# Patient Record
Sex: Female | Born: 1965 | Race: White | Hispanic: No | Marital: Married | Smoking: Former smoker
Health system: Southern US, Community
[De-identification: ages and names within clinical notes are randomized; demographics above are authoritative.]

## PROBLEM LIST (undated history)

## (undated) ENCOUNTER — Telehealth

## (undated) ENCOUNTER — Encounter

## (undated) ENCOUNTER — Encounter: Attending: Family Medicine | Primary: Family Medicine

## (undated) ENCOUNTER — Telehealth: Attending: Clinical | Primary: Clinical

## (undated) ENCOUNTER — Encounter: Attending: Psychiatry | Primary: Psychiatry

## (undated) ENCOUNTER — Ambulatory Visit: Payer: BLUE CROSS/BLUE SHIELD

## (undated) ENCOUNTER — Encounter: Payer: BLUE CROSS/BLUE SHIELD | Attending: Clinical | Primary: Clinical

## (undated) ENCOUNTER — Ambulatory Visit: Payer: PRIVATE HEALTH INSURANCE | Attending: Physician Assistant | Primary: Physician Assistant

## (undated) ENCOUNTER — Ambulatory Visit: Payer: BLUE CROSS/BLUE SHIELD | Attending: Psychiatry | Primary: Psychiatry

## (undated) ENCOUNTER — Encounter
Attending: Student in an Organized Health Care Education/Training Program | Primary: Student in an Organized Health Care Education/Training Program

## (undated) ENCOUNTER — Ambulatory Visit

## (undated) ENCOUNTER — Non-Acute Institutional Stay: Payer: PRIVATE HEALTH INSURANCE | Attending: Family Medicine | Primary: Family Medicine

## (undated) ENCOUNTER — Telehealth: Attending: Family Medicine | Primary: Family Medicine

## (undated) ENCOUNTER — Ambulatory Visit
Payer: BLUE CROSS/BLUE SHIELD | Attending: Addiction (Substance Use Disorder) | Primary: Addiction (Substance Use Disorder)

## (undated) ENCOUNTER — Ambulatory Visit: Payer: PRIVATE HEALTH INSURANCE

## (undated) ENCOUNTER — Inpatient Hospital Stay: Payer: BLUE CROSS/BLUE SHIELD

## (undated) ENCOUNTER — Telehealth
Attending: Student in an Organized Health Care Education/Training Program | Primary: Student in an Organized Health Care Education/Training Program

## (undated) ENCOUNTER — Ambulatory Visit: Payer: BLUE CROSS/BLUE SHIELD | Attending: Clinical | Primary: Clinical

## (undated) ENCOUNTER — Encounter
Payer: PRIVATE HEALTH INSURANCE | Attending: Student in an Organized Health Care Education/Training Program | Primary: Student in an Organized Health Care Education/Training Program

## (undated) ENCOUNTER — Ambulatory Visit
Payer: BLUE CROSS/BLUE SHIELD | Attending: Student in an Organized Health Care Education/Training Program | Primary: Student in an Organized Health Care Education/Training Program

## (undated) ENCOUNTER — Other Ambulatory Visit: Attending: Clinical | Primary: Clinical

## (undated) ENCOUNTER — Encounter: Attending: Family | Primary: Family

## (undated) ENCOUNTER — Ambulatory Visit: Payer: PRIVATE HEALTH INSURANCE | Attending: Clinical | Primary: Clinical

## (undated) ENCOUNTER — Ambulatory Visit: Payer: MEDICARE

## (undated) ENCOUNTER — Encounter: Attending: Anesthesiology | Primary: Anesthesiology

## (undated) ENCOUNTER — Encounter: Attending: Adult Health | Primary: Adult Health

## (undated) ENCOUNTER — Ambulatory Visit: Attending: Addiction (Substance Use Disorder) | Primary: Addiction (Substance Use Disorder)

## (undated) ENCOUNTER — Encounter: Attending: Clinical | Primary: Clinical

## (undated) ENCOUNTER — Telehealth: Attending: Adult Health | Primary: Adult Health

## (undated) ENCOUNTER — Ambulatory Visit
Attending: Student in an Organized Health Care Education/Training Program | Primary: Student in an Organized Health Care Education/Training Program

## (undated) ENCOUNTER — Ambulatory Visit
Payer: MEDICARE | Attending: Student in an Organized Health Care Education/Training Program | Primary: Student in an Organized Health Care Education/Training Program

## (undated) ENCOUNTER — Ambulatory Visit: Attending: Pharmacist | Primary: Pharmacist

## (undated) ENCOUNTER — Encounter: Payer: BLUE CROSS/BLUE SHIELD | Attending: Psychiatry | Primary: Psychiatry

## (undated) ENCOUNTER — Ambulatory Visit
Payer: PRIVATE HEALTH INSURANCE | Attending: Student in an Organized Health Care Education/Training Program | Primary: Student in an Organized Health Care Education/Training Program

## (undated) ENCOUNTER — Ambulatory Visit: Payer: BLUE CROSS/BLUE SHIELD | Attending: Family | Primary: Family

## (undated) ENCOUNTER — Ambulatory Visit: Payer: Medicaid (Managed Care)

## (undated) ENCOUNTER — Other Ambulatory Visit

## (undated) ENCOUNTER — Ambulatory Visit: Attending: Clinical | Primary: Clinical

## (undated) ENCOUNTER — Ambulatory Visit: Payer: PRIVATE HEALTH INSURANCE | Attending: Family Medicine | Primary: Family Medicine

## (undated) ENCOUNTER — Ambulatory Visit: Payer: BLUE CROSS/BLUE SHIELD | Attending: Anesthesiology | Primary: Anesthesiology

## (undated) ENCOUNTER — Ambulatory Visit: Payer: MEDICARE | Attending: Clinical | Primary: Clinical

## (undated) ENCOUNTER — Ambulatory Visit: Payer: MEDICARE | Attending: Family Medicine | Primary: Family Medicine

## (undated) DIAGNOSIS — R112 Nausea with vomiting, unspecified: Secondary | ICD-10-CM

## (undated) DIAGNOSIS — N63 Unspecified lump in unspecified breast: Secondary | ICD-10-CM

## (undated) DIAGNOSIS — Z8489 Family history of other specified conditions: Secondary | ICD-10-CM

## (undated) DIAGNOSIS — J45909 Unspecified asthma, uncomplicated: Secondary | ICD-10-CM

## (undated) DIAGNOSIS — K219 Gastro-esophageal reflux disease without esophagitis: Secondary | ICD-10-CM

## (undated) DIAGNOSIS — F32A Depression, unspecified: Secondary | ICD-10-CM

## (undated) DIAGNOSIS — K760 Fatty (change of) liver, not elsewhere classified: Secondary | ICD-10-CM

## (undated) DIAGNOSIS — R51 Headache: Secondary | ICD-10-CM

## (undated) DIAGNOSIS — E669 Obesity, unspecified: Secondary | ICD-10-CM

## (undated) DIAGNOSIS — Z9889 Other specified postprocedural states: Secondary | ICD-10-CM

## (undated) DIAGNOSIS — F329 Major depressive disorder, single episode, unspecified: Secondary | ICD-10-CM

## (undated) DIAGNOSIS — Z720 Tobacco use: Secondary | ICD-10-CM

## (undated) DIAGNOSIS — Z1389 Encounter for screening for other disorder: Secondary | ICD-10-CM

## (undated) DIAGNOSIS — I10 Essential (primary) hypertension: Secondary | ICD-10-CM

## (undated) DIAGNOSIS — G47419 Narcolepsy without cataplexy: Secondary | ICD-10-CM

## (undated) DIAGNOSIS — R519 Headache, unspecified: Secondary | ICD-10-CM

## (undated) DIAGNOSIS — F988 Other specified behavioral and emotional disorders with onset usually occurring in childhood and adolescence: Secondary | ICD-10-CM

## (undated) DIAGNOSIS — Z1239 Encounter for other screening for malignant neoplasm of breast: Secondary | ICD-10-CM

## (undated) DIAGNOSIS — N611 Abscess of the breast and nipple: Secondary | ICD-10-CM

## (undated) DIAGNOSIS — M549 Dorsalgia, unspecified: Secondary | ICD-10-CM

## (undated) DIAGNOSIS — F909 Attention-deficit hyperactivity disorder, unspecified type: Secondary | ICD-10-CM

## (undated) HISTORY — DX: Depression, unspecified: F32.A

## (undated) HISTORY — DX: Encounter for screening for other disorder: Z13.89

## (undated) HISTORY — DX: Major depressive disorder, single episode, unspecified: F32.9

## (undated) HISTORY — PX: INCISION AND DRAINAGE BREAST ABSCESS: SUR672

## (undated) HISTORY — PX: HERNIA REPAIR: SHX51

## (undated) HISTORY — DX: Encounter for other screening for malignant neoplasm of breast: Z12.39

## (undated) HISTORY — DX: Abscess of the breast and nipple: N61.1

## (undated) HISTORY — DX: Dorsalgia, unspecified: M54.9

## (undated) HISTORY — DX: Unspecified lump in unspecified breast: N63.0

## (undated) HISTORY — DX: Obesity, unspecified: E66.9

## (undated) HISTORY — DX: Essential (primary) hypertension: I10

## (undated) HISTORY — DX: Tobacco use: Z72.0

---
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
## 1991-03-05 DIAGNOSIS — N611 Abscess of the breast and nipple: Secondary | ICD-10-CM

## 1991-03-05 HISTORY — DX: Abscess of the breast and nipple: N61.1

## 1999-03-05 HISTORY — PX: ABDOMINAL HYSTERECTOMY: SHX81

## 2003-12-19 ENCOUNTER — Ambulatory Visit: Payer: Self-pay | Admitting: Pain Medicine

## 2003-12-26 ENCOUNTER — Ambulatory Visit: Payer: Self-pay | Admitting: Pain Medicine

## 2004-01-10 ENCOUNTER — Ambulatory Visit: Payer: Self-pay | Admitting: Pain Medicine

## 2004-01-17 ENCOUNTER — Ambulatory Visit: Payer: Self-pay | Admitting: Physician Assistant

## 2004-01-17 ENCOUNTER — Emergency Department: Payer: Self-pay | Admitting: Emergency Medicine

## 2004-01-24 ENCOUNTER — Ambulatory Visit: Payer: Self-pay | Admitting: Pain Medicine

## 2005-01-19 ENCOUNTER — Emergency Department: Payer: Self-pay | Admitting: Internal Medicine

## 2007-01-16 ENCOUNTER — Emergency Department: Payer: Self-pay | Admitting: Emergency Medicine

## 2007-03-12 ENCOUNTER — Ambulatory Visit: Payer: Self-pay | Admitting: Family Medicine

## 2007-03-24 ENCOUNTER — Ambulatory Visit: Payer: Self-pay | Admitting: Family Medicine

## 2007-03-30 ENCOUNTER — Emergency Department: Payer: Self-pay | Admitting: Emergency Medicine

## 2008-11-13 ENCOUNTER — Emergency Department: Payer: Self-pay | Admitting: Emergency Medicine

## 2009-01-11 ENCOUNTER — Observation Stay: Payer: Self-pay | Admitting: Internal Medicine

## 2009-06-05 ENCOUNTER — Ambulatory Visit: Payer: Self-pay | Admitting: Surgery

## 2011-03-05 HISTORY — PX: BREAST BIOPSY: SHX20

## 2011-04-28 ENCOUNTER — Emergency Department: Payer: Self-pay | Admitting: Emergency Medicine

## 2011-05-29 ENCOUNTER — Emergency Department: Payer: Self-pay | Admitting: Emergency Medicine

## 2012-01-13 ENCOUNTER — Ambulatory Visit: Payer: Self-pay | Admitting: Family Medicine

## 2012-01-16 ENCOUNTER — Ambulatory Visit: Payer: Self-pay | Admitting: Family Medicine

## 2012-02-15 ENCOUNTER — Encounter: Payer: Self-pay | Admitting: Pediatrics

## 2012-02-15 ENCOUNTER — Encounter: Payer: Self-pay | Admitting: *Deleted

## 2012-03-04 HISTORY — PX: BREAST SURGERY: SHX581

## 2012-08-17 ENCOUNTER — Ambulatory Visit: Payer: Self-pay | Admitting: General Surgery

## 2012-08-17 ENCOUNTER — Encounter: Payer: Self-pay | Admitting: General Surgery

## 2012-08-27 ENCOUNTER — Ambulatory Visit: Payer: Self-pay | Admitting: General Surgery

## 2012-09-04 ENCOUNTER — Emergency Department: Payer: Self-pay | Admitting: Emergency Medicine

## 2012-09-04 LAB — URINALYSIS, COMPLETE
Bilirubin,UR: NEGATIVE
Blood: NEGATIVE
Ketone: NEGATIVE
Leukocyte Esterase: NEGATIVE
Nitrite: NEGATIVE
Ph: 6 (ref 4.5–8.0)
Specific Gravity: 1.018 (ref 1.003–1.030)
Squamous Epithelial: <1
WBC UR: 1 /HPF (ref 0–5)

## 2012-09-04 LAB — LIPASE, BLOOD: Lipase: 161 U/L (ref 73–393)

## 2012-09-04 LAB — COMPREHENSIVE METABOLIC PANEL
Alkaline Phosphatase: 59 U/L (ref 50–136)
Bilirubin,Total: 0.7 mg/dL (ref 0.2–1.0)
Chloride: 103 mmol/L (ref 98–107)
Co2: 28 mmol/L (ref 21–32)
EGFR (African American): >60
Glucose: 116 mg/dL — ABNORMAL HIGH (ref 65–99)
Osmolality: 277 (ref 275–301)
SGOT(AST): 41 U/L — ABNORMAL HIGH (ref 15–37)
SGPT (ALT): 69 U/L (ref 12–78)

## 2012-09-04 LAB — CBC
HGB: 14.3 g/dL (ref 12.0–16.0)
MCHC: 34.3 g/dL (ref 32.0–36.0)
MCV: 84 fL (ref 80–100)
Platelet: 211 10*3/uL (ref 150–440)
RDW: 14 % (ref 11.5–14.5)
WBC: 9.5 10*3/uL (ref 3.6–11.0)

## 2012-09-04 LAB — TROPONIN I: Troponin-I: <0.02 ng/mL

## 2012-09-07 ENCOUNTER — Encounter: Payer: Self-pay | Admitting: *Deleted

## 2012-09-14 ENCOUNTER — Encounter: Payer: Self-pay | Admitting: General Surgery

## 2012-09-14 ENCOUNTER — Inpatient Hospital Stay
Admission: RE | Admit: 2012-09-14 | Discharge: 2012-09-14 | Disposition: A | Discharge status: Home / Self Care | Payer: Self-pay | Source: Ambulatory Visit | Attending: General Surgery | Admitting: General Surgery

## 2012-09-14 ENCOUNTER — Ambulatory Visit (INDEPENDENT_AMBULATORY_CARE_PROVIDER_SITE_OTHER): Payer: BC Managed Care – PPO | Admitting: General Surgery

## 2012-09-14 VITALS — BP 140/70 | HR 68 | Resp 14 | Ht 62.0 in | Wt 292.0 lb

## 2012-09-14 DIAGNOSIS — N63 Unspecified lump in unspecified breast: Secondary | ICD-10-CM

## 2012-09-14 NOTE — Progress Notes (Signed)
Patient ID: [name], female   DOB: 1966-02-11, 47 y.o.   MRN: [id number]  Chief Complaint  Patient presents with  . Follow-up    mammogram    HPI [name] is a 47 y.o. female here for follow up mammogram Uni left done at [facility] 08/17/12. Patient reports some left breast tenderness that will extend into her left shoulder and arm down to her wrist and lasts for about a week. She states this occurs about every 6 weeks. Pain is described as a 7-8 on the pain scale. This is unchanged from her previous visit. She states that the mass near her left axilla seems to enlarge during these episodes. The patient underwent biopsy of multiple lesions of the left breast on January 29, 2012. Biopsies of the 2, 5 and 8:00 position were completed. The dominant lesion was at the 2:00 position. It is from this area the patient experienced most of her pain extending up into the shoulder and on down in the arm. HPI  Past Medical History  Diagnosis Date  . Hypertension   . Tobacco use   . Breast screening   . Lump or mass in breast     left  . Obesity, unspecified   . Back pain   . Screening for obesity   . Breast abscess 1993    right    Past Surgical History  Procedure Laterality Date  . Abdominal hysterectomy  2001  . Breast biopsy Left 2013  . Cholecystectomy  1986  . Cesarean section  1986, 1991    Family History  Problem Relation Age of Onset  . Other Other 65    Colon Cancer, Skin Cancer  . Other Maternal Uncle 18    Prostate Cancer    Social History History  Substance Use Topics  . Smoking status: Former Smoker -- 1.00 packs/day    Types: Cigarettes  . Smokeless tobacco: Never Used  . Alcohol Use: Yes    Allergies  Allergen Reactions  . Cyclosporine Nausea And Vomiting  . Haldol (Haloperidol Lactate) Other (See Comments)    hyperactivity    Current Outpatient Prescriptions  Medication Sig Dispense Refill  . amitriptyline (ELAVIL) 50 MG tablet Take 1 tablet by mouth  daily.      Marland Kitchen amLODipine (NORVASC) 5 MG tablet Take 5 mg by mouth daily.      . chlorthalidone (HYGROTON) 25 MG tablet Take 25 mg by mouth daily.      Marland Kitchen FLUoxetine (PROZAC) 40 MG capsule Take 40 mg by mouth daily.      . Ibuprofen (ADVIL PO) Take by mouth.      . METOPROLOL SUCCINATE ER PO Take 25 mg by mouth daily.      . montelukast (SINGULAIR) 10 MG tablet Take 10 mg by mouth daily.       No current facility-administered medications for this visit.    Review of Systems Review of Systems  HENT: Negative.   Respiratory: Negative.   Cardiovascular: Negative.     Blood pressure 140/70, pulse 68, resp. rate 14, height 5\' 2"  (1.575 m), weight 292 lb (132.45 kg).  Physical Exam Physical Exam  Constitutional: She is oriented to person, place, and time. She appears well-developed and well-nourished.  Eyes: Conjunctivae are normal. No scleral icterus.  Neck: Neck supple.  Cardiovascular: Normal rate, regular rhythm and normal heart sounds.   Pulmonary/Chest: Effort normal and breath sounds normal. Right breast exhibits no inverted nipple, no mass, no nipple discharge, no  skin change and no tenderness. Left breast exhibits skin change (2 o'clock 10 cm from the nipple there is an area of thickening).    Neurological: She is alert and oriented to person, place, and time.    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   Ultrasound examination of the 2:00 position of the left breast shows the previously identified area to have significantly increased in size to 1.2 x 2.5 x 3.5 cm. Previously this area measured 0.8 x 1.5 x 2.6 cm.  Left breast mammogram dated August 17, 2012 shows 3 biopsy clips in the left breast, one within the lesion at the 2:00 position. BI-RAD-2.  Assessment    Enlarging left breast mass.     Plan    The left breast lesion is enlarging and  appears to be the foci for pain of the left breast formal excision is recommended. The patient is well aware that this may not relieve all of the radicular pain she is experiencing in the left upper extremity.      Patient's surgery has been scheduled for 09-25-12 at [facility].  Earline Mayotte 09/15/2012, 8:59 PM   a

## 2012-09-14 NOTE — Patient Instructions (Addendum)
Lumpectomy, Breast Conserving Surgery A lumpectomy is breast surgery that removes only part of the breast. Another name used may be partial mastectomy. The amount removed varies. Make sure you understand how much of your breast will be removed. Reasons for a lumpectomy:  Any solid breast mass.  Grouped significant nodularity that may be confused with a solitary breast mass. Lumpectomy is the most common form of breast cancer surgery today. The surgeon removes the portion of your breast which contains the tumor (cancer). This is the lump. Some normal tissue around the lump is also removed to be sure that all the tumor has been removed.  If cancer cells are found in the margins where the breast tissue was removed, your surgeon will do more surgery to remove the remaining cancer tissue. This is called re-excision surgery. Radiation and/or chemotherapy treatments are often given following a lumpectomy to kill any cancer cells that could possibly remain.  REASONS YOU MAY NOT BE ABLE TO HAVE BREAST CONSERVING SURGERY:  The tumor is located in more than one place.  Your breast is small and the tumor is large so the breast would be disfigured.  The entire tumor removal is not successful with a lumpectomy.  You cannot commit to a full course of chemotherapy, radiation therapy or are pregnant and cannot have radiation.  You have previously had radiation to the breast to treat cancer. HOW A LUMPECTOMY IS PERFORMED If overnight nursing is not required following a biopsy, a lumpectomy can be performed as a same-day surgery. This can be done in a hospital, clinic, or surgical center. The anesthesia used will depend on your surgeon. They will discuss this with you. A general anesthetic keeps you sleeping through the procedure. LET YOUR CAREGIVERS KNOW ABOUT THE FOLLOWING:  Allergies  Medications taken including herbs, eye drops, over the counter medications, and creams.  Use of steroids (by mouth or  creams)  Previous problems with anesthetics or Novocaine.  Possibility of pregnancy, if this applies  History of blood clots (thrombophlebitis)  History of bleeding or blood problems.  Previous surgery  Other health problems BEFORE THE PROCEDURE You should be present one hour prior to your procedure unless directed otherwise.  AFTER THE PROCEDURE  After surgery, you will be taken to the recovery area where a nurse will watch and check your progress. Once you're awake, stable, and taking fluids well, barring other problems you will be allowed to go home.  Ice packs applied to your operative site may help with discomfort and keep the swelling down.  A small rubber drain may be placed in the breast for a couple of days to prevent a hematoma from developing in the breast.  A pressure dressing may be applied for 24 to 48 hours to prevent bleeding.  Keep the wound dry.  You may resume a normal diet and activities as directed. Avoid strenuous activities affecting the arm on the side of the biopsy site such as tennis, swimming, heavy lifting (more than 10 pounds) or pulling.  Bruising in the breast is normal following this procedure.  Wearing a bra - even to bed - may be more comfortable and also help keep the dressing on.  Change dressings as directed.  Only take over-the-counter or prescription medicines for pain, discomfort, or fever as directed by your caregiver. Call for your results as instructed by your surgeon. Remember it is your responsibility to get the results of your lumpectomy if your surgeon asked you to follow-up. Do not assume   everything is fine if you have not heard from your caregiver. SEEK MEDICAL CARE IF:   There is increased bleeding (more than a small spot) from the wound.  You notice redness, swelling, or increasing pain in the wound.  Pus is coming from wound.  An unexplained oral temperature above 102 F (38.9 C) develops.  You notice a foul smell  coming from the wound or dressing. SEEK IMMEDIATE MEDICAL CARE IF:   You develop a rash.  You have difficulty breathing.  You have any allergic problems. Document Released: 04/01/2006 Document Revised: 05/13/2011 Document Reviewed: 07/03/2006 [facility] Patient Information 2014 [place], [place].  Patient's surgery has been scheduled for 09-25-12 at [facility].

## 2012-09-15 ENCOUNTER — Encounter: Payer: Self-pay | Admitting: General Surgery

## 2012-09-15 ENCOUNTER — Other Ambulatory Visit: Payer: Self-pay | Admitting: General Surgery

## 2012-09-15 DIAGNOSIS — N63 Unspecified lump in unspecified breast: Secondary | ICD-10-CM

## 2012-09-24 ENCOUNTER — Ambulatory Visit: Payer: Self-pay | Admitting: Anesthesiology

## 2012-09-24 ENCOUNTER — Telehealth: Payer: Self-pay | Admitting: *Deleted

## 2012-09-24 LAB — POTASSIUM: Potassium: 3 mmol/L — ABNORMAL LOW (ref 3.5–5.1)

## 2012-09-24 NOTE — Telephone Encounter (Signed)
KCL RX/ patient aware KCL 20 meq 1 po three times today and then once in am #10 per Dr Lemar Livings was called in

## 2012-09-25 ENCOUNTER — Ambulatory Visit: Payer: Self-pay | Admitting: General Surgery

## 2012-09-25 DIAGNOSIS — D249 Benign neoplasm of unspecified breast: Secondary | ICD-10-CM

## 2012-09-28 ENCOUNTER — Encounter: Payer: Self-pay | Admitting: General Surgery

## 2012-09-29 ENCOUNTER — Telehealth: Payer: Self-pay

## 2012-09-29 NOTE — Telephone Encounter (Signed)
Patient notified of results and is very pleased, she will follow up as scheduled.

## 2012-09-29 NOTE — Telephone Encounter (Signed)
Message copied by Sinda Du on Tue Sep 29, 2012 10:04 AM ------      Message from: [place], Merrily Pew      Created: Tue Sep 29, 2012 10:00 AM       Please notify the patient the left breast biopsy completed last week was fine: Fibroadenoma, as expected. Thanks. ------

## 2012-09-30 ENCOUNTER — Encounter: Payer: Self-pay | Admitting: General Surgery

## 2012-10-05 ENCOUNTER — Encounter: Payer: Self-pay | Admitting: General Surgery

## 2012-10-05 ENCOUNTER — Ambulatory Visit (INDEPENDENT_AMBULATORY_CARE_PROVIDER_SITE_OTHER): Payer: BC Managed Care – PPO | Admitting: General Surgery

## 2012-10-05 VITALS — BP 130/72 | HR 72 | Resp 14 | Ht 66.0 in | Wt 289.0 lb

## 2012-10-05 DIAGNOSIS — D242 Benign neoplasm of left breast: Secondary | ICD-10-CM

## 2012-10-05 DIAGNOSIS — N63 Unspecified lump in unspecified breast: Secondary | ICD-10-CM

## 2012-10-05 DIAGNOSIS — D249 Benign neoplasm of unspecified breast: Secondary | ICD-10-CM

## 2012-10-05 NOTE — Patient Instructions (Signed)
Patient to return in 6 months with a bilateral mammogram.

## 2012-10-05 NOTE — Progress Notes (Signed)
Patient ID: [name], female   DOB: [birth year], 47 y.o.   MRN: [id number]  Chief Complaint  Patient presents with  . Routine Post Op    left breast excision of fibroadenoma    [name] is a 47 y.o. female who presents for a post op left breast excision of a fibroadenoma. The procedure was done on 09/25/12. The only complaint at this time is some stinging that she noticed last night. Overall she is doing well.   HPI  Past Medical History  Diagnosis Date  . Hypertension   . Tobacco use   . Breast screening   . Lump or mass in breast     left  . Obesity, unspecified   . Back pain   . Screening for obesity   . Breast abscess 1993    right    Past Surgical History  Procedure Laterality Date  . Abdominal hysterectomy  2001  . Cholecystectomy  1986  . Cesarean section  1986, 1991  . Breast biopsy Left 2013  . Breast surgery Left 2014    excision of fibroadenoma.    Family History  Problem Relation Age of Onset  . Other Other 65    Colon Cancer, Skin Cancer  . Other Maternal Uncle 44    Prostate Cancer    Social History History  Substance Use Topics  . Smoking status: Former Smoker -- 1.00 packs/day    Types: Cigarettes  . Smokeless tobacco: Never Used  . Alcohol Use: Yes    Allergies  Allergen Reactions  . Cyclosporine Nausea And Vomiting  . Haldol (Haloperidol Lactate) Other (See Comments)    hyperactivity    Current Outpatient Prescriptions  Medication Sig Dispense Refill  . amitriptyline (ELAVIL) 50 MG tablet Take 1 tablet by mouth daily.      Marland Kitchen amLODipine (NORVASC) 5 MG tablet Take 5 mg by mouth daily.      . chlorthalidone (HYGROTON) 25 MG tablet Take 25 mg by mouth daily.      Marland Kitchen FLUoxetine (PROZAC) 40 MG capsule Take 40 mg by mouth daily.      . Ibuprofen (ADVIL PO) Take by mouth.      . METOPROLOL SUCCINATE ER PO Take 25 mg by mouth daily.      . montelukast (SINGULAIR) 10 MG tablet Take 10 mg by mouth daily.       No current  facility-administered medications for this visit.    Review of Systems Review of Systems  Constitutional: Negative.   Respiratory: Negative.   Cardiovascular: Negative.     Blood pressure 130/72, pulse 72, resp. rate 14, height 5\' 6"  (1.676 m), weight 289 lb (131.09 kg).  Physical Exam Physical Exam  Constitutional: She is oriented to person, place, and time. She appears well-developed and well-nourished.  Pulmonary/Chest:  2 mm separation of wound on left breast.   Neurological: She is alert and oriented to person, place, and time.  Skin: Skin is warm and dry.    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    Plan    The patient will resume annual bilateral mammograms in six months with an office visit to follow.        Earline Mayotte 10/06/2012, 12:44 PM

## 2013-01-15 ENCOUNTER — Inpatient Hospital Stay: Payer: Self-pay | Admitting: Psychiatry

## 2013-01-15 LAB — TSH: Thyroid Stimulating Horm: 3.31 u[IU]/mL

## 2013-01-15 LAB — COMPREHENSIVE METABOLIC PANEL
Albumin: 3.8 g/dL (ref 3.4–5.0)
Anion Gap: 7 (ref 7–16)
Bilirubin,Total: 1 mg/dL (ref 0.2–1.0)
Chloride: 105 mmol/L (ref 98–107)
Creatinine: 0.96 mg/dL (ref 0.60–1.30)
Glucose: 131 mg/dL — ABNORMAL HIGH (ref 65–99)
Osmolality: 277 (ref 275–301)
Potassium: 2.9 mmol/L — ABNORMAL LOW (ref 3.5–5.1)
SGOT(AST): 56 U/L — ABNORMAL HIGH (ref 15–37)
SGPT (ALT): 93 U/L — ABNORMAL HIGH (ref 12–78)
Sodium: 139 mmol/L (ref 136–145)
Total Protein: 7.1 g/dL (ref 6.4–8.2)

## 2013-01-15 LAB — DRUG SCREEN, URINE
Amphetamines, Ur Screen: NEGATIVE (ref ?–1000)
Barbiturates, Ur Screen: NEGATIVE (ref ?–200)
Benzodiazepine, Ur Scrn: NEGATIVE (ref ?–200)
Cannabinoid 50 Ng, Ur ~~LOC~~: NEGATIVE (ref ?–50)
Cocaine Metabolite,Ur ~~LOC~~: NEGATIVE (ref ?–300)
MDMA (Ecstasy)Ur Screen: NEGATIVE (ref ?–500)
Tricyclic, Ur Screen: NEGATIVE (ref ?–1000)

## 2013-01-15 LAB — CBC
HCT: 43.2 % (ref 35.0–47.0)
MCV: 84 fL (ref 80–100)
RBC: 5.13 10*6/uL (ref 3.80–5.20)
RDW: 14.5 % (ref 11.5–14.5)
WBC: 8.7 10*3/uL (ref 3.6–11.0)

## 2013-01-15 LAB — ETHANOL: Ethanol: <3 mg/dL

## 2013-01-23 LAB — BASIC METABOLIC PANEL
BUN: 14 mg/dL (ref 7–18)
Co2: 28 mmol/L (ref 21–32)
Creatinine: 0.82 mg/dL (ref 0.60–1.30)
Glucose: 90 mg/dL (ref 65–99)
Potassium: 3.5 mmol/L (ref 3.5–5.1)

## 2013-03-24 ENCOUNTER — Ambulatory Visit: Payer: BC Managed Care – PPO | Admitting: General Surgery

## 2013-04-15 ENCOUNTER — Encounter: Payer: Self-pay | Admitting: *Deleted

## 2013-09-30 DIAGNOSIS — F988 Other specified behavioral and emotional disorders with onset usually occurring in childhood and adolescence: Secondary | ICD-10-CM | POA: Insufficient documentation

## 2014-01-03 ENCOUNTER — Encounter: Payer: Self-pay | Admitting: General Surgery

## 2014-06-24 NOTE — Discharge Summary (Signed)
PATIENT NAME:  [name], [name] MR#:  [id number] DATE OF BIRTH:  [birth year]  DATE OF ADMISSION:  01/15/2013 DATE OF DISCHARGE:  01/23/2013  HOSPITAL COURSE: See dictated history and physical for details of admission. A 49 year old woman with a history of mood lability was admitted to the hospital because of a feeling of being out of control,  having both homicidal and suicidal ideation. In the hospital, she was treated with medication management as well as individual and group psychotherapy. She showed gradual improvement with initially some resistance to treatment but soon became appropriately interactive and showed good insight. She tolerated medicine well. At no time in the hospital did she display any suicidal behavior. Medical issues appeared to be stable. The patient agreed at the time of discharge to follow up with Dr. Annitta Jersey at our psychiatric clinic. She was counseled about the importance of staying on medication and staying involved in therapy to work on better ways to cope with managing the stress that she is undergoing.   MENTAL STATUS EXAM AT DISCHARGE: Neatly dressed and groomed woman, looks her stated age, cooperative with the interview. Good eye contact. Normal psychomotor activity. Speech normal rate, tone and volume. Affect euthymic, reactive, appropriate. Mood stated as good, thoughts lucid. No evidence of loosening of associations or delusions. Denies auditory or visual hallucinations. Denies suicidal or homicidal ideation. Shows improved insight and judgment. Normal intelligence. Alert and oriented x 4.   DISCHARGE MEDICATIONS: Trazodone 100 mg at night, duloxetine 60 mg once a day, metoprolol 25 mg once a day, Combivent inhaler 4 times a day as needed for chronic obstructive pulmonary disease, amlodipine 5 mg once a day, hydrochlorothiazide 25 mg once a day, potassium chloride 10 mEq twice a day.   LABORATORY RESULTS: Admission labs showed a chemistry panel with elevated glucose 131,  potassium low at 2.9. ALT elevated at 93, AST elevated at 56. Alcohol undetected. TSH 3.3, normal. Drug screen negative. Follow-up chemistry panel showed resolution of the potassium. CBC normal.   DISPOSITION: Discharge home. Follow up with Dr. Annitta Jersey.   DIAGNOSIS, PRINCIPAL AND PRIMARY:   AXIS I: Major depression, recurrent, severe.   SECONDARY DIAGNOSES: AXIS I: Deferred.   AXIS II: Borderline features.   AXIS III: High blood pressure, chronic obstructive pulmonary disease, low potassium.   AXIS IV: Severe from the difficulty getting along with her family.   AXIS V: Functioning at time of discharge 56.    ____________________________ Gonzella Lex, MD jtc:cc D: 01/26/2013 23:09:37 ET T: 01/26/2013 23:15:56 ET JOB#: [id number]  cc: Gonzella Lex, MD, <Dictator> Gonzella Lex MD ELECTRONICALLY SIGNED 01/26/2013 23:45

## 2014-06-24 NOTE — Op Note (Signed)
PATIENT NAME:  [name], [name] MR#:  [id number] DATE OF BIRTH:  [birth year]  DATE OF PROCEDURE:  09/25/2012  PREOPERATIVE DIAGNOSIS: Enlarging left breast fibroadenoma.   POSTOPERATIVE DIAGNOSIS: Enlarging left breast fibroadenoma.   OPERATIVE PROCEDURE: Excision of left breast fibroadenoma.   OPERATING SURGEON:  Hervey Ard.   ANESTHESIA: General by LMA under Dr. Benjamine Mola. Marcaine 0.5% plain, 30 mL local infiltration.   ESTIMATED BLOOD LOSS: Minimal.   CLINICAL NOTE: This is 49 year old woman who has had increasing pain in the upper outer quadrant of the right breast centered in the area of a previously biopsied fibroadenoma. It had increased in size since her original evaluation and as this was the focal area of pain it was felt to be reasonable to excise this.   OPERATIVE NOTE: With the patient under adequate general anesthesia, the area was prepped with ChloraPrep and draped. Marcaine was infiltrated for postoperative analgesia. Ultrasound was used to confirm the location of the fibroadenoma in the 2 o'clock position of the left breast 10 cm from the nipple. A curvilinear incision was made over the mass and carried down through the skin and subcutaneous tissue with hemostasis achieved by electrocautery. The mass was excised with an adjacent area of normal tissue. There was noted to be fairly notable inflammatory changes in the superior lateral aspect, likely the source for her pain. The specimen was orientated and sent fresh for pathology per protocol. The wound was closed in layers with 2-0 Vicryl figure-of-eight sutures deep and a running 4-0 Vicryl subcuticular suture for the skin. Dermabond was applied.   The patient tolerated the procedure well and was taken to the recovery room in stable condition.    ____________________________ Robert Bellow, MD jwb:dp D: 09/25/2012 10:24:25 ET T: 09/25/2012 10:38:31 ET JOB#: [id number]  cc: Robert Bellow, MD, <Dictator> Valera Castle, MD Curvin Hunger Amedeo Kinsman MD ELECTRONICALLY SIGNED 09/25/2012 15:19

## 2014-06-24 NOTE — H&P (Signed)
PATIENT NAME:  [name], [name] MR#:  [id number] DATE OF BIRTH:  [birth year]  DATE OF ADMISSION:  01/15/2013  IDENTIFYING INFORMATION AND CHIEF COMPLAINT: This is a 49 year old woman with a history of depression who came to the Emergency Room with a chief complaint, "I'm out of control."   HISTORY OF PRESENT ILLNESS: Information obtained from the patient and the chart. Feels like her mood has been spiraling down for the last few months. Feels depressed and sad all the time. Crying spells. Difficulty sleeping. Will go days without sleeping and then sleep for days at a time. Low energy. Has suicidal ideation, but will not tell me what her plan is. Denies hallucinations. Has anger problems. Feels chronically angry and occasionally to the point of wanting to hurt her daughter-in-law because she thinks that woman has destroyed her son's life. The patient has been taking Prozac 40 mg a day from her primary care doctor. Not seeing a therapist. No other acute treatment. Major stresses from the death of her father recently, as well as being unemployed, financial problems and trouble within her family.   PAST PSYCHIATRIC HISTORY: Says she had a psychiatric hospitalization about 25 years ago. Saw Dr. Thurmond Butts for outpatient treatment. She took Prozac 20 mg a day for years and thought that it was helpful. She recently had an increase to 40 mg a day, did not think that it was of any benefit. The patient does not drink, does not use drugs. No other hospitalizations. No history of violence.   SOCIAL HISTORY: Lives with her husband and 2 adult sons. One of the sons is severely depressed. The other one is just unhappy. The patient had a job a couple of months ago, but thought that it was miserable and that she was treated badly there and still holds a grudge about it.   PAST MEDICAL HISTORY: Overweight. High blood pressure. Chronic bronchitis.   CURRENT MEDICATIONS: Prozac 40 mg a day, amlodipine 5 mg a day, metoprolol 25 mg a  day, albuterol/ipratropium p.r.n. inhaler.   ALLERGIES: CYCLOSPORINE, HALDOL, PREDNISONE, TETRACYCLINE.   REVIEW OF SYSTEMS: Depressed mood, low energy, poor sleep, sadness, suicidal ideation. No hallucinations. No paranoia. No other specific physical problems. Chronic shortness of breath.   MENTAL STATUS EXAMINATION: Overweight woman, looks her stated age, cooperative with the interview. Good eye contact, normal psychomotor activity. Speech easy to understand. Affect sad, tearful. Mood stated as down and depressed. Thoughts are lucid without loosening of associations or delusions. Denies auditory or visual hallucinations. Denies suicidal intent right now, but has chronic suicidal ideation and thinks about it quite a bit. No homicidal intent, but also has homicidal ideation that she thinks about quite a bit. Insight and judgment are a little bit impaired. Intelligence normal. Alert and oriented x 4.   PHYSICAL EXAMINATION: GENERAL: Overweight, weighs 300 pounds.  SKIN: No acute skin lesions.  HEENT: Pupils equal and reactive. Face symmetric. Oral mucosa normal.  NEUROMUSCULAR: Neck and back nontender. Full range of motion at all extremities. Normal gait. Strength and reflexes symmetric and normal throughout. Cranial nerves symmetric and normal.  LUNGS: Clear without wheezes.  HEART: Regular rate and rhythm.  ABDOMEN: Soft, nontender, normal bowel sounds.  VITAL SIGNS: Include temperature of 98.1, pulse 101, respirations 20, blood pressure 183/103.   LABORATORY RESULTS: Drug screen is negative. TSH normal. CBC normal. Alcohol undetectable. Chemistry shows low potassium at 2.9, elevated ALT at 93 and AST at 56, glucose 131.   ASSESSMENT: A 49 year old woman with severe  major depression, suicidal ideation, significant risk of self-harm. No outpatient treatment currently. He needs hospitalization.   TREATMENT PLAN: Admit to psychiatry. Discuss options for treatment. Discontinue Prozac. Start  Wellbutrin, going up to 300 mg a day. Continue treatment for blood pressure. Add potassium supplementation. Engage her in groups and activities, individual and group psychotherapy. Try and get collateral history.   DIAGNOSIS, PRINCIPAL AND PRIMARY:  AXIS I: Major depression, severe, recurrent.   SECONDARY DIAGNOSES: AXIS I: No further.  AXIS II: No diagnosis.  AXIS III: Obesity, high blood pressure, chronic bronchitis, also hypokalemia.  AXIS IV: Severe from being out of work, other family stresses.  AXIS V: Functioning at time of evaluation is 30.    ____________________________ Gonzella Lex, MD jtc:jcm D: 01/15/2013 17:18:13 ET T: 01/15/2013 18:22:08 ET JOB#: [id number]  cc: Gonzella Lex, MD, <Dictator>  Gonzella Lex MD ELECTRONICALLY SIGNED 01/15/2013 21:51

## 2014-06-27 ENCOUNTER — Ambulatory Visit
Admit: 2014-06-27 | Disposition: A | Discharge status: Home / Self Care | Payer: Self-pay | Attending: Family Medicine | Admitting: Family Medicine

## 2014-09-01 ENCOUNTER — Telehealth: Payer: Self-pay | Admitting: Psychiatry

## 2014-09-01 MED ORDER — LISDEXAMFETAMINE DIMESYLATE 60 MG PO CAPS: 60.0000 mg | ORAL_CAPSULE | ORAL | Status: DC

## 2014-09-01 MED ORDER — LISDEXAMFETAMINE DIMESYLATE 70 MG PO CAPS: 70.0000 mg | ORAL_CAPSULE | ORAL | Status: DC

## 2014-09-01 NOTE — Telephone Encounter (Signed)
I prepared a prescription for Vyvanse 70 mg, #30 with no refills. Patient can come by to pick this prescription up.

## 2014-09-07 ENCOUNTER — Telehealth: Payer: Self-pay | Admitting: Psychiatry

## 2014-09-08 ENCOUNTER — Other Ambulatory Visit: Payer: Self-pay

## 2014-09-12 NOTE — Telephone Encounter (Signed)
Ninety-day supply of Abilify and fluoxetine have been sent to mail order pharmacy. AW

## 2014-09-13 MED ORDER — ARIPIPRAZOLE 5 MG PO TABS: 5.0000 mg | ORAL_TABLET | Freq: Every day | ORAL | Status: DC

## 2014-09-13 MED ORDER — DULOXETINE HCL 60 MG PO CPEP: 60.0000 mg | ORAL_CAPSULE | Freq: Every day | ORAL | Status: DC

## 2014-09-13 NOTE — Addendum Note (Signed)
Addended by: Marjie Skiff on: 09/13/2014 10:33 AM   Modules accepted: Orders

## 2014-09-15 ENCOUNTER — Ambulatory Visit: Payer: Self-pay | Admitting: Psychiatry

## 2014-09-27 ENCOUNTER — Encounter: Payer: Self-pay | Admitting: Psychiatry

## 2014-09-27 ENCOUNTER — Ambulatory Visit (INDEPENDENT_AMBULATORY_CARE_PROVIDER_SITE_OTHER): Payer: BLUE CROSS/BLUE SHIELD | Admitting: Psychiatry

## 2014-09-27 VITALS — BP 118/88 | HR 84 | Temp 97.3°F | Ht 66.0 in | Wt 262.4 lb

## 2014-09-27 DIAGNOSIS — M797 Fibromyalgia: Secondary | ICD-10-CM | POA: Insufficient documentation

## 2014-09-27 DIAGNOSIS — F508 Other eating disorders: Secondary | ICD-10-CM

## 2014-09-27 DIAGNOSIS — F332 Major depressive disorder, recurrent severe without psychotic features: Secondary | ICD-10-CM

## 2014-09-27 DIAGNOSIS — E785 Hyperlipidemia, unspecified: Secondary | ICD-10-CM | POA: Insufficient documentation

## 2014-09-27 DIAGNOSIS — Z8679 Personal history of other diseases of the circulatory system: Secondary | ICD-10-CM | POA: Insufficient documentation

## 2014-09-27 DIAGNOSIS — F5081 Binge eating disorder: Secondary | ICD-10-CM | POA: Insufficient documentation

## 2014-09-27 DIAGNOSIS — F411 Generalized anxiety disorder: Secondary | ICD-10-CM

## 2014-09-27 DIAGNOSIS — I10 Essential (primary) hypertension: Secondary | ICD-10-CM | POA: Insufficient documentation

## 2014-09-27 MED ORDER — LISDEXAMFETAMINE DIMESYLATE 70 MG PO CAPS: 70.0000 mg | ORAL_CAPSULE | ORAL | Status: DC

## 2014-09-27 MED ORDER — CLONAZEPAM 0.5 MG PO TABS: 0.5000 mg | ORAL_TABLET | Freq: Three times a day (TID) | ORAL | Status: DC | PRN

## 2014-09-27 NOTE — Progress Notes (Signed)
BH MD/PA/NP OP Progress Note  09/27/2014 9:13 AM NEIL ERRICKSON  MRN:  [id number]  Subjective:  Asian returns for follow-up of her binge eating disorder, generalized anxiety disorder and major depression. She states her mood is good and her medications are working well. She does state she wished she could take more the Vyvanse that she wishes she could suppress her appetite more. However she's aware she is at the maximum dose. She stated the biggest stressor right now is that her workplace had given her another position and she had been in that position for 5 months and enjoyed it. 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 She states this is a disappointment to as she had liked the new position and made friends there. She is denying any side effects and feels like the medications are working well. She does state that at times she has anxiety and wants some type of when necessary to be able to address the anxiety when it comes up or her frustrations such as at work as noted above.*We will add her a when necessary dose of clonazepam. She indicated she recently got 90 day supplies of both her Abilify and Cymbalta. She had picked up her Vyvanse at the end of June. Chief Complaint:  Chief Complaint    Medication Refill; Follow-up     Visit Diagnosis:  No diagnosis found.  Past Medical History:  Past Medical History  Diagnosis Date  . Hypertension   . Tobacco use   . Breast screening   . Lump or mass in breast     left  . Obesity, unspecified   . Back pain   . Screening for obesity   . Breast abscess 1993    right  . Depression     Past Surgical History  Procedure Laterality Date  . Abdominal hysterectomy  2001  . Cholecystectomy  1986  . Cesarean section  1986, 1991  . Breast biopsy Left 2013  . Breast surgery Left 2014    excision of fibroadenoma.   Family History:  Family History  Problem Relation Age of Onset  .  Other Other 65    Colon Cancer, Skin Cancer  . Other Maternal Uncle 70    Prostate Cancer  . Diabetes Mother   . Heart disease Mother   . Parkinson's disease Father   . Colon cancer Father   . Hypotension Father   . Alcohol abuse Father   . Seizures Sister   . Depression Sister   . Depression Sister    Social History:  History   Social History  . Marital Status: Married    Spouse Name: N/A  . Number of Children: N/A  . Years of Education: N/A   Social History Main Topics  . Smoking status: Former Smoker -- 1.00 packs/day    Types: Cigarettes    Quit date: 09/27/1994  . Smokeless tobacco: Never Used  . Alcohol Use: 0.6 oz/week    0 Standard drinks or equivalent, 1 Glasses of wine per week  . Drug Use: No  . Sexual Activity: No   Other Topics Concern  . None   Social History Narrative   Additional History:   Assessment:   Musculoskeletal: Strength & Muscle Tone: within normal limits Gait & Station: normal Patient leans: N/A  Psychiatric Specialty Exam: HPI  Review of Systems  Psychiatric/Behavioral: Negative for depression, suicidal ideas, hallucinations, memory loss and substance abuse. The patient is not  nervous/anxious and does not have insomnia.     Blood pressure 118/88, pulse 84, temperature 97.3 F (36.3 C), temperature source Tympanic, height 5\' 6"  (1.676 m), weight 262 lb 6.4 oz (119.024 kg), SpO2 96 %.Body mass index is 42.37 kg/(m^2).  General Appearance: Well Groomed  Eye Contact:  Good  Speech:  Normal Rate  Volume:  Normal  Mood:  Good  Affect:  Congruent  Thought Process:  Linear and Logical  Orientation:  Full (Time, Place, and Person)  Thought Content:  Negative  Suicidal Thoughts:  No  Homicidal Thoughts:  No  Memory:  Immediate;   Good Recent;   Good Remote;   Good  Judgement:  Good  Insight:  Good  Psychomotor Activity:  Negative  Concentration:  Good  Recall:  Good  Fund of Knowledge: Good  Language: Good  Akathisia:   Negative  Handed:  Right unknown   AIMS (if indicated):  Done today  Assets:  Communication Skills Desire for Improvement Social Support Vocational/Educational  ADL's:  Intact  Cognition: WNL  Sleep:  good   Is the patient at risk to self?  No. Has the patient been a risk to self in the past 6 months?  No. Has the patient been a risk to self within the distant past?  No. Is the patient a risk to others?  No. Has the patient been a risk to others in the past 6 months?  No. Has the patient been a risk to others within the distant past?  No.  Current Medications: Current Outpatient Prescriptions  Medication Sig Dispense Refill  . albuterol (PROVENTIL) (2.5 MG/3ML) 0.083% nebulizer solution Inhale into the lungs.    Marland Kitchen amitriptyline (ELAVIL) 50 MG tablet Take 1 tablet by mouth daily.    . ARIPiprazole (ABILIFY) 5 MG tablet Take 1 tablet (5 mg total) by mouth daily. 90 tablet 0  . clonazePAM (KLONOPIN) 0.5 MG tablet Take by mouth.    . DULoxetine (CYMBALTA) 60 MG capsule Take 1 capsule (60 mg total) by mouth daily. 90 capsule 0  . HYDROcodone-acetaminophen (NORCO/VICODIN) 5-325 MG per tablet Take 1 tablet by mouth every 8 (eight) hours as needed. for pain  0  . lisdexamfetamine (VYVANSE) 70 MG capsule Take 1 capsule (70 mg total) by mouth every morning. 30 capsule 0  . lisdexamfetamine (VYVANSE) 70 MG capsule Take 1 capsule (70 mg total) by mouth every morning. 30 capsule 0  . meloxicam (MOBIC) 15 MG tablet Take 15 mg by mouth daily.  10   No current facility-administered medications for this visit.    Medical Decision Making:  Established Problem, Stable/Improving (1), Review of Medication Regimen & Side Effects (2) and Review of New Medication or Change in Dosage (2)  Treatment Plan Summary:Medication management and Plan We will continue the patient's Cymbalta and duloxetine as previously prescribed. She states she recently just got 90 day supplies of those medications. We will  increase her Klonopin from 0.5 2 times a day to 0.5  3 times a day as needed. Patient will continue her Vyvanse 70 mg daily. She was given a 30 day supply prescription for Vyvanse to fill later this month and then another prescription to fill in late August. Patient follow up in 3 months. She's been encouraged call any questions concerns prior to her next point.   Faith Rogue 09/27/2014, 9:13 AM

## 2014-10-07 ENCOUNTER — Telehealth: Payer: Self-pay

## 2014-10-07 NOTE — Telephone Encounter (Signed)
called patient let her know that the medical certification form was complete. pt states she wanted it faxed but there is no release signed for me to do that.  so i told pt that form would be mailed and that I would also send a relsease form also .

## 2014-12-13 ENCOUNTER — Other Ambulatory Visit: Payer: Self-pay

## 2014-12-13 MED ORDER — LISDEXAMFETAMINE DIMESYLATE 70 MG PO CAPS: 70.0000 mg | ORAL_CAPSULE | ORAL | Status: DC

## 2014-12-13 NOTE — Telephone Encounter (Signed)
pt called [name] and stated that she needs a refill on her vyvance.  pt last seen on 09-27-14 next appt 12-27-14

## 2014-12-13 NOTE — Telephone Encounter (Signed)
pt going to pick up rx.  rx vyvanse 70 mg id # [id number]  order # [id number]

## 2014-12-13 NOTE — Telephone Encounter (Signed)
left message rx ready, left message if she wanted to pick it up or fax it in somewhere else.

## 2014-12-27 ENCOUNTER — Ambulatory Visit: Payer: Self-pay | Admitting: Psychiatry

## 2015-01-03 ENCOUNTER — Encounter: Payer: Self-pay | Admitting: Psychiatry

## 2015-01-03 ENCOUNTER — Ambulatory Visit (INDEPENDENT_AMBULATORY_CARE_PROVIDER_SITE_OTHER): Payer: BLUE CROSS/BLUE SHIELD | Admitting: Psychiatry

## 2015-01-03 VITALS — BP 118/84 | HR 83 | Temp 97.3°F | Ht 66.0 in | Wt 267.6 lb

## 2015-01-03 DIAGNOSIS — F411 Generalized anxiety disorder: Secondary | ICD-10-CM | POA: Diagnosis not present

## 2015-01-03 DIAGNOSIS — F332 Major depressive disorder, recurrent severe without psychotic features: Secondary | ICD-10-CM

## 2015-01-03 DIAGNOSIS — F5081 Binge eating disorder: Secondary | ICD-10-CM

## 2015-01-03 MED ORDER — LISDEXAMFETAMINE DIMESYLATE 70 MG PO CAPS: 70.0000 mg | ORAL_CAPSULE | ORAL | Status: DC

## 2015-01-03 NOTE — Progress Notes (Signed)
[facility] MD/PA/NP OP Progress Note  01/03/2015 9:06 AM [name]  MRN:  [id number]  Subjective:  Patient returns for follow-up of her binge eating disorder, generalized anxiety disorder and major depression. She related the biggest issue for her now is her mother who has recently transitioned to a nursing home. She states that the medications continue to work well. She states however she did have an experience when she went on a vacation locally and did not have her Vyvanse. She states she felt physically uncomfortable and actually had to return home for her to get her Vyvanse. She asked about withdrawal. I indicated that she is on the maximum dose and that typically if she was gone to come off of it we could taper to minimize any withdrawal.  She indicates she continues to work. She states she is not particularly happy with where she has been placed but she is able to except her current situation and persevere. Chief Complaint: left Vyvanse at home "had to return home" Chief Complaint    Follow-up; Medication Refill     Visit Diagnosis:     ICD-9-CM ICD-10-CM   1. Major depressive disorder, recurrent, severe without psychotic features ([place]) 296.33 F33.2   2. Binge eating disorder 307.50 F50.81   3. GAD (generalized anxiety disorder) 300.02 F41.1     Past Medical History:  Past Medical History  Diagnosis Date  . Hypertension   . Tobacco use   . Breast screening   . Lump or mass in breast     left  . Obesity, unspecified   . Back pain   . Screening for obesity   . Breast abscess 1993    right  . Depression     Past Surgical History  Procedure Laterality Date  . Abdominal hysterectomy  2001  . Cholecystectomy  1986  . Cesarean section  1986, 1991  . Breast biopsy Left 2013  . Breast surgery Left 2014    excision of fibroadenoma.   Family History:  Family History  Problem Relation Age of Onset  . Other Other 65    Colon Cancer, Skin Cancer  . Other Maternal Uncle 70   Prostate Cancer  . Diabetes Mother   . Heart disease Mother   . Parkinson's disease Father   . Colon cancer Father   . Hypotension Father   . Alcohol abuse Father   . Seizures Sister   . Depression Sister   . Depression Sister    Social History:  Social History   Social History  . Marital Status: Married    Spouse Name: N/A  . Number of Children: N/A  . Years of Education: N/A   Social History Main Topics  . Smoking status: Former Smoker -- 1.00 packs/day    Types: Cigarettes    Quit date: 09/27/1994  . Smokeless tobacco: Never Used  . Alcohol Use: 0.6 oz/week    1 Glasses of wine, 0 Standard drinks or equivalent, 0 Cans of beer, 0 Shots of liquor per week  . Drug Use: No  . Sexual Activity: Yes    Birth Control/ Protection: None   Other Topics Concern  . None   Social History Narrative   Additional History:   Assessment:   Musculoskeletal: Strength & Muscle Tone: within normal limits Gait & Station: normal Patient leans: N/A  Psychiatric Specialty Exam: HPI  Review of Systems  Psychiatric/Behavioral: Negative for depression, suicidal ideas, hallucinations, memory loss and substance abuse. The patient is not nervous/anxious and  does not have insomnia.   All other systems reviewed and are negative.   Blood pressure 118/84, pulse 83, temperature 97.3 F (36.3 C), temperature source Tympanic, height 5\' 6"  (1.676 m), weight 267 lb 9.6 oz (121.383 kg), SpO2 96 %.Body mass index is 43.21 kg/(m^2).  General Appearance: Well Groomed  Eye Contact:  Good  Speech:  Normal Rate  Volume:  Normal  Mood:  Good  Affect:  Congruent  Thought Process:  Linear and Logical  Orientation:  Full (Time, Place, and Person)  Thought Content:  Negative  Suicidal Thoughts:  No  Homicidal Thoughts:  No  Memory:  Immediate;   Good Recent;   Good Remote;   Good  Judgement:  Good  Insight:  Good  Psychomotor Activity:  Negative  Concentration:  Good  Recall:  Good  Fund of  Knowledge: Good  Language: Good  Akathisia:  Negative  Handed:  Right unknown   AIMS (if indicated):  Done today  Assets:  Communication Skills Desire for Improvement Social Support Vocational/Educational  ADL's:  Intact  Cognition: WNL  Sleep:  good   Is the patient at risk to self?  No. Has the patient been a risk to self in the past 6 months?  No. Has the patient been a risk to self within the distant past?  No. Is the patient a risk to others?  No. Has the patient been a risk to others in the past 6 months?  No. Has the patient been a risk to others within the distant past?  No.  Current Medications: Current Outpatient Prescriptions  Medication Sig Dispense Refill  . clonazePAM (KLONOPIN) 0.5 MG tablet Take 1 tablet (0.5 mg total) by mouth 3 (three) times daily as needed for anxiety. 270 tablet 0  . DULoxetine (CYMBALTA) 60 MG capsule Take 1 capsule (60 mg total) by mouth daily. 90 capsule 0  . lisdexamfetamine (VYVANSE) 70 MG capsule Take 1 capsule (70 mg total) by mouth every morning. 30 capsule 0  . lisdexamfetamine (VYVANSE) 70 MG capsule Take 1 capsule (70 mg total) by mouth every morning. 30 capsule 0  . lisdexamfetamine (VYVANSE) 70 MG capsule Take 1 capsule (70 mg total) by mouth every morning. 30 capsule 0  . meloxicam (MOBIC) 15 MG tablet Take 15 mg by mouth daily.  10   No current facility-administered medications for this visit.    Medical Decision Making:  Established Problem, Stable/Improving (1), Review of Medication Regimen & Side Effects (2) and Review of New Medication or Change in Dosage (2)  Treatment Plan Summary:Medication management and Plan   Major  depressive disorder-  We will continue the patient's duloxetine 60 mg daily as previously prescribed. Patient occasionally no longer takes Abilify because she ran out but noticed that shaking stopped when she is discontinued the Abilify.   Generalized anxiety disorder-She states she has adequate   Clonazepam. She will remain at 0.5 mg 3 times a day as needed.  Binge eating disorder-continue Vyvanse 70 mg daily. Patient has been given a prescription to fill this month and given another prescription to fill in December. She is aware to call for her third prescription.   Patient follow up in 3 months. She's been encouraged call any questions concerns prior to her next point.   Faith Rogue 01/03/2015, 9:06 AM

## 2015-02-08 ENCOUNTER — Ambulatory Visit (INDEPENDENT_AMBULATORY_CARE_PROVIDER_SITE_OTHER): Payer: BLUE CROSS/BLUE SHIELD | Admitting: Psychiatry

## 2015-02-08 ENCOUNTER — Encounter: Payer: Self-pay | Admitting: Psychiatry

## 2015-02-08 VITALS — BP 122/88 | HR 96 | Temp 98.0°F | Ht 66.0 in | Wt 264.8 lb

## 2015-02-08 DIAGNOSIS — F5081 Binge eating disorder: Secondary | ICD-10-CM | POA: Diagnosis not present

## 2015-02-08 DIAGNOSIS — F411 Generalized anxiety disorder: Secondary | ICD-10-CM

## 2015-02-08 DIAGNOSIS — F332 Major depressive disorder, recurrent severe without psychotic features: Secondary | ICD-10-CM | POA: Diagnosis not present

## 2015-02-08 MED ORDER — CLONAZEPAM 0.5 MG PO TABS: 1.0000 mg | ORAL_TABLET | Freq: Two times a day (BID) | ORAL | Status: DC

## 2015-02-08 MED ORDER — DULOXETINE HCL 30 MG PO CPEP: ORAL_CAPSULE | ORAL | Status: DC

## 2015-02-08 MED ORDER — CLONAZEPAM 1 MG PO TABS: 1.0000 mg | ORAL_TABLET | Freq: Two times a day (BID) | ORAL | Status: DC

## 2015-02-08 NOTE — Progress Notes (Signed)
BH MD/PA/NP OP Progress Note  02/08/2015 2:02 PM [name]  MRN:  [id number]  Subjective:  Patient returns for follow-up of her binge eating disorder, generalized anxiety disorder and major depression. She indicates that these are largely stemming from caring for elderly mother. She describes some conflict with her mother from earlier in life however she states her mother is getting to the point where she needs to possibly transition to an assisted living/nursing home facility. She states that she has cut back her hours to work from about 40 hours per week to 20 hours per week and she states this is stressful because the employer is in their busy season. She states that she cares for her mother 3 days a week another sister cares for their mother 3 days a week and then they alternate on the seventh day. Ever patient clarifies that she still is doing all of the cleaning and dishes in that the alternating is really only in terms of preparing dinner for their mother.  We spent some time processing whether this was her being in a major depressive episode or more a response to this stressful situation related to her mother. Patient surmises it was largely more related to her mother. I discussed whether she was down and depressed when she was not engaged her thinking about the situation and she seemed to feel that was more anxiety and stress and largely all related to her mother.  At this time I decided that we would not change her medications per her perhaps see if an increase in them could help her. I also discussed perhaps therapy as an option however patient states she does have some outlets and is able to communicate openly with her sisters and thus he will not pursue that at this time. Chief Complaint: left Vyvanse at home "had to return home" Chief Complaint    Follow-up; Medication Refill; Anxiety; Stress     Visit Diagnosis:     ICD-9-CM ICD-10-CM   1. Major depressive disorder, recurrent,  severe without psychotic features ([place]) 296.33 F33.2   2. Binge eating disorder 307.50 F50.81   3. GAD (generalized anxiety disorder) 300.02 F41.1     Past Medical History:  Past Medical History  Diagnosis Date  . Hypertension   . Tobacco use   . Breast screening   . Lump or mass in breast     left  . Obesity, unspecified   . Back pain   . Screening for obesity   . Breast abscess 1993    right  . Depression     Past Surgical History  Procedure Laterality Date  . Abdominal hysterectomy  2001  . Cholecystectomy  1986  . Cesarean section  1986, 1991  . Breast biopsy Left 2013  . Breast surgery Left 2014    excision of fibroadenoma.   Family History:  Family History  Problem Relation Age of Onset  . Other Other 65    Colon Cancer, Skin Cancer  . Other Maternal Uncle 70    Prostate Cancer  . Diabetes Mother   . Heart disease Mother   . Parkinson's disease Father   . Colon cancer Father   . Hypotension Father   . Alcohol abuse Father   . Seizures Sister   . Depression Sister   . Depression Sister    Social History:  Social History   Social History  . Marital Status: Married    Spouse Name: N/A  . Number of Children: N/A  .  Years of Education: N/A   Social History Main Topics  . Smoking status: Former Smoker -- 1.00 packs/day    Types: Cigarettes    Quit date: 09/27/1994  . Smokeless tobacco: Never Used  . Alcohol Use: No  . Drug Use: No  . Sexual Activity: Yes    Birth Control/ Protection: None   Other Topics Concern  . None   Social History Narrative   Additional History:   Assessment:   Musculoskeletal: Strength & Muscle Tone: within normal limits Gait & Station: normal Patient leans: N/A  Psychiatric Specialty Exam: Anxiety Symptoms include nervous/anxious behavior. Patient reports no insomnia or suicidal ideas.      Review of Systems  Psychiatric/Behavioral: Negative for depression, suicidal ideas, hallucinations, memory loss and  substance abuse. The patient is nervous/anxious. The patient does not have insomnia.        Patient is stressed related to caring for mother  All other systems reviewed and are negative.   Blood pressure 122/88, pulse 96, temperature 98 F (36.7 C), temperature source Tympanic, height 5\' 6"  (1.676 m), weight 264 lb 12.8 oz (120.112 kg), SpO2 95 %.Body mass index is 42.76 kg/(m^2).  General Appearance: Well Groomed  Eye Contact:  Good  Speech:  Normal Rate  Volume:  Normal  Mood:  I'm about 2 weeks away from checking myself in the hospital  Affect:  Tearful, anxious however somewhat able to laugh about things at the end of the appointment. I she joked at the end of the appointment asking if I wanted to go to her mother's house to tend to her mother.  Thought Process:  Linear and Logical  Orientation:  Full (Time, Place, and Person)  Thought Content:  Negative  Suicidal Thoughts:  No  Homicidal Thoughts:  No  Memory:  Immediate;   Good Recent;   Good Remote;   Good  Judgement:  Good  Insight:  Good  Psychomotor Activity:  Negative  Concentration:  Good  Recall:  Good  Fund of Knowledge: Good  Language: Good  Akathisia:  Negative  Handed:  Right unknown   AIMS (if indicated):  Done today  Assets:  Communication Skills Desire for Improvement Social Support Vocational/Educational  ADL's:  Intact  Cognition: WNL  Sleep:  good   Is the patient at risk to self?  No. Has the patient been a risk to self in the past 6 months?  No. Has the patient been a risk to self within the distant past?  No. Is the patient a risk to others?  No. Has the patient been a risk to others in the past 6 months?  No. Has the patient been a risk to others within the distant past?  No.  Current Medications: Current Outpatient Prescriptions  Medication Sig Dispense Refill  . clonazePAM (KLONOPIN) 1 MG tablet Take 1 tablet (1 mg total) by mouth 2 (two) times daily. 60 tablet 1  . DULoxetine (CYMBALTA)  30 MG capsule Take one tablet in the morning and two tablets in the evening. AW 90 capsule 1  . lisdexamfetamine (VYVANSE) 70 MG capsule Take 1 capsule (70 mg total) by mouth every morning. 30 capsule 0  . meloxicam (MOBIC) 15 MG tablet Take 15 mg by mouth daily.  10   No current facility-administered medications for this visit.    Medical Decision Making:  Established Problem, Stable/Improving (1), Review of Medication Regimen & Side Effects (2) and Review of New Medication or Change in Dosage (2)  Treatment Plan  Summary:Medication management and Plan   Major  depressive disorder-  We increased the patient's duloxetine from 60 mg at bedtime to 30 mg in the morning and 60 mg at bedtime. Patient seemed to report a past good response to Abilify however at this time are limited to target anxiety related issues related to her social stressor. Perhaps at the next visit should these increases not be effectively may try to switch her antidepressant or restart augmentation with Abilify.  Generalized anxiety disorder-we will increase her Klonopin to 1 mg twice a day. This perhaps may help her with anxiety that is intensified due to significant social stressors related to her mother.  Adjustment disorder with anxiety-significant stress related to an elderly mother. we will try to address with the increase Klonopin and Cymbalta. Should this and additional medication changes not resolve problems perhaps patient will need to engage in therapy.  Binge eating disorder-continue Vyvanse 70 mg daily.   Patient follow up in 2 weeks. I made patient aware my departure and that she would transition to another provider within this clinic. She's been encouraged call any questions concerns prior to her next point.   Faith Rogue 02/08/2015, 2:02 PM

## 2015-02-10 ENCOUNTER — Other Ambulatory Visit (HOSPITAL_COMMUNITY): Payer: Self-pay

## 2015-02-10 NOTE — Telephone Encounter (Signed)
spoke with pharmacy about the denied pa.  per the pharm. he states that we could try and see if they will do a 30 mg in am #30  and then do a 60mg  one in pm #30 and see if that will go threw.   Please advise if you want to do the duloxetine 30mg  one in am #30 and then do duloxetine 60mg  #30 one in pm.

## 2015-02-10 NOTE — Telephone Encounter (Signed)
I called insurance for pa and they would not auth.  will only pay for #30 for the duloxetine.

## 2015-02-14 ENCOUNTER — Other Ambulatory Visit: Payer: Self-pay

## 2015-02-14 NOTE — Telephone Encounter (Signed)
how do you want to do medication duloxetine. do you want to try an see if the insurance will cover a 30mg  in am and then dod a 60mg  in the pm?

## 2015-02-15 MED ORDER — DULOXETINE HCL 30 MG PO CPEP
ORAL_CAPSULE | ORAL | Status: DC
Start: 2015-02-15 — End: 2015-03-13

## 2015-02-15 MED ORDER — DULOXETINE HCL 60 MG PO CPEP: 60.0000 mg | ORAL_CAPSULE | Freq: Every day | ORAL | Status: DC

## 2015-02-15 NOTE — Telephone Encounter (Signed)
rx was called in for 30mg  am and 60 in pm.

## 2015-02-15 NOTE — Telephone Encounter (Signed)
left message that insurance would not approved medication because it was more the the 30 .  so what we did was call in a 30mg  for the am and a 60 mg for the pm   Pt was told on message if she had any question to please call our office back

## 2015-02-23 ENCOUNTER — Ambulatory Visit: Payer: BLUE CROSS/BLUE SHIELD | Admitting: Psychiatry

## 2015-02-28 ENCOUNTER — Telehealth: Payer: Self-pay

## 2015-02-28 NOTE — Telephone Encounter (Signed)
pt called states that over the weekend. she call and it was a nurse line and she said that she was in crisis her mom is in the hospital.  pt states that she increase her medication  She increase her klonopin 1mg  to three times a day instead of two times a day and her Cymbalta the 30 mg in the morning she increase to two in the morning.  Pt has appt on  03-17-15 last seen on  02-08-15.  Pt was advised that dr. Jimmye Norman was not in the office this week.  Pt understood.  Pt states she thinks she has enough medication to last until he can get back into the office.  Pt states that she will call back if she doesn't have enough.

## 2015-03-01 NOTE — Progress Notes (Signed)
Reorder or on cymbalta, klonopin and vyvanse.  abilify discontinued. Pharmacy notified

## 2015-03-08 ENCOUNTER — Telehealth: Payer: Self-pay | Admitting: Psychiatry

## 2015-03-08 MED ORDER — ARIPIPRAZOLE 5 MG PO TABS: 5.0000 mg | ORAL_TABLET | ORAL | Status: DC

## 2015-03-08 NOTE — Telephone Encounter (Signed)
Patient today and she discussed that her mother is undergoing assessment for cancer. She indicated at this point is either bone cancer and lymph node cancer but they're waiting on the results from a biopsy. Patient states since then she's been experiencing depression and crying. She had increased her Klonopin from 1 mg twice a day to 1 mg 3 times a day and she is increased her Cymbalta from 30 mg in the morning and 60 mg at night to 60 mg twice a day. She reports some improvement with this but still feels like she's been down given the circumstances. Given her reports of crying and mood stability we had already discussed perhaps augmenting her with Abilify and thus we are going to restart her back on Abilify 5 mg in the morning. Risk and benefits discussed in patient's able to consent. Patient has follow-up with me on 03/17/2015. AW

## 2015-03-13 ENCOUNTER — Encounter: Payer: Self-pay | Admitting: Emergency Medicine

## 2015-03-13 ENCOUNTER — Emergency Department: Payer: BLUE CROSS/BLUE SHIELD

## 2015-03-13 ENCOUNTER — Emergency Department
Admission: EM | Admit: 2015-03-13 | Discharge: 2015-03-14 | Disposition: A | Discharge status: Other Acute Inpatient Hospital | Payer: BLUE CROSS/BLUE SHIELD | Attending: Emergency Medicine | Admitting: Emergency Medicine

## 2015-03-13 DIAGNOSIS — Z792 Long term (current) use of antibiotics: Secondary | ICD-10-CM | POA: Diagnosis not present

## 2015-03-13 DIAGNOSIS — R319 Hematuria, unspecified: Secondary | ICD-10-CM | POA: Diagnosis not present

## 2015-03-13 DIAGNOSIS — F1110 Opioid abuse, uncomplicated: Secondary | ICD-10-CM | POA: Insufficient documentation

## 2015-03-13 DIAGNOSIS — Z79899 Other long term (current) drug therapy: Secondary | ICD-10-CM | POA: Diagnosis not present

## 2015-03-13 DIAGNOSIS — R45851 Suicidal ideations: Secondary | ICD-10-CM | POA: Diagnosis not present

## 2015-03-13 DIAGNOSIS — F329 Major depressive disorder, single episode, unspecified: Secondary | ICD-10-CM | POA: Insufficient documentation

## 2015-03-13 DIAGNOSIS — I10 Essential (primary) hypertension: Secondary | ICD-10-CM | POA: Diagnosis not present

## 2015-03-13 DIAGNOSIS — Z87891 Personal history of nicotine dependence: Secondary | ICD-10-CM | POA: Insufficient documentation

## 2015-03-13 DIAGNOSIS — F1310 Sedative, hypnotic or anxiolytic abuse, uncomplicated: Secondary | ICD-10-CM | POA: Insufficient documentation

## 2015-03-13 DIAGNOSIS — F332 Major depressive disorder, recurrent severe without psychotic features: Secondary | ICD-10-CM | POA: Diagnosis not present

## 2015-03-13 LAB — URINE DRUG SCREEN, QUALITATIVE (ARMC ONLY)
AMPHETAMINES, UR SCREEN: POSITIVE — AB
BENZODIAZEPINE, UR SCRN: POSITIVE — AB
Barbiturates, Ur Screen: NOT DETECTED
Cannabinoid 50 Ng, Ur ~~LOC~~: NOT DETECTED
Cocaine Metabolite,Ur ~~LOC~~: NOT DETECTED
MDMA (ECSTASY) UR SCREEN: NOT DETECTED
METHADONE SCREEN, URINE: NOT DETECTED
Opiate, Ur Screen: POSITIVE — AB
PHENCYCLIDINE (PCP) UR S: NOT DETECTED
TRICYCLIC, UR SCREEN: NOT DETECTED

## 2015-03-13 LAB — COMPREHENSIVE METABOLIC PANEL
ALK PHOS: 81 U/L (ref 38–126)
ALT: 24 U/L (ref 14–54)
AST: 17 U/L (ref 15–41)
Albumin: 4.2 g/dL (ref 3.5–5.0)
Anion gap: 4 — ABNORMAL LOW (ref 5–15)
BUN: 13 mg/dL (ref 6–20)
CALCIUM: 8.9 mg/dL (ref 8.9–10.3)
CO2: 25 mmol/L (ref 22–32)
CREATININE: 0.8 mg/dL (ref 0.44–1.00)
Chloride: 108 mmol/L (ref 101–111)
Glucose, Bld: 92 mg/dL (ref 65–99)
Potassium: 3.9 mmol/L (ref 3.5–5.1)
Sodium: 137 mmol/L (ref 135–145)
Total Bilirubin: 0.6 mg/dL (ref 0.3–1.2)
Total Protein: 7.3 g/dL (ref 6.5–8.1)

## 2015-03-13 LAB — URINALYSIS COMPLETE WITH MICROSCOPIC (ARMC ONLY)
Bilirubin Urine: NEGATIVE
GLUCOSE, UA: NEGATIVE mg/dL
Hgb urine dipstick: NEGATIVE
Leukocytes, UA: NEGATIVE
Nitrite: NEGATIVE
PROTEIN: 30 mg/dL — AB
SPECIFIC GRAVITY, URINE: 1.026 (ref 1.005–1.030)
pH: 5 (ref 5.0–8.0)

## 2015-03-13 LAB — CBC WITH DIFFERENTIAL/PLATELET
BASOS PCT: 1 %
Basophils Absolute: 0.1 10*3/uL (ref 0–0.1)
EOS ABS: 0.3 10*3/uL (ref 0–0.7)
EOS PCT: 3 %
HCT: 45.2 % (ref 35.0–47.0)
HEMOGLOBIN: 15.3 g/dL (ref 12.0–16.0)
Lymphocytes Relative: 42 %
Lymphs Abs: 3.3 10*3/uL (ref 1.0–3.6)
MCH: 28.3 pg (ref 26.0–34.0)
MCHC: 33.9 g/dL (ref 32.0–36.0)
MCV: 83.6 fL (ref 80.0–100.0)
MONOS PCT: 7 %
Monocytes Absolute: 0.5 10*3/uL (ref 0.2–0.9)
NEUTROS PCT: 47 %
Neutro Abs: 3.8 10*3/uL (ref 1.4–6.5)
PLATELETS: 161 10*3/uL (ref 150–440)
RBC: 5.41 MIL/uL — AB (ref 3.80–5.20)
RDW: 13.6 % (ref 11.5–14.5)
WBC: 7.9 10*3/uL (ref 3.6–11.0)

## 2015-03-13 LAB — TSH: TSH: 2.508 u[IU]/mL (ref 0.350–4.500)

## 2015-03-13 LAB — ETHANOL

## 2015-03-13 MED ORDER — CLONAZEPAM 1 MG PO TABS
1.0000 mg | ORAL_TABLET | Freq: Two times a day (BID) | ORAL | Status: DC
  Administered 2015-03-13 – 2015-03-14 (×2): 1 mg via ORAL
  Filled 2015-03-13 (×2): qty 1

## 2015-03-13 MED ORDER — DULOXETINE HCL 60 MG PO CPEP
60.0000 mg | ORAL_CAPSULE | Freq: Every day | ORAL | Status: DC
  Administered 2015-03-13 – 2015-03-14 (×2): 60 mg via ORAL
  Filled 2015-03-13 (×2): qty 1

## 2015-03-13 MED ORDER — ARIPIPRAZOLE 5 MG PO TABS
5.0000 mg | ORAL_TABLET | ORAL | Status: DC
  Administered 2015-03-14: 5 mg via ORAL
  Filled 2015-03-13: qty 1

## 2015-03-13 NOTE — ED Notes (Signed)
Patient transported to CT at this time via [facility] officer due to IVC. Pt stable, no acute distress noted. Pt calm and cooperative at this time.

## 2015-03-13 NOTE — ED Notes (Signed)
Pt ambulatory to bathroom at this time by self with no concerns.

## 2015-03-13 NOTE — ED Notes (Signed)
Report given to [facility] RN at this time. Pt ambulatory to [facility] at this time via ED tech Olivia Mackie and [facility] Animal nutritionist. Pt calm and cooperative at this time, no acute distress noted.

## 2015-03-13 NOTE — ED Notes (Signed)
Lab called regarding urine culture add on, will add on at this time  

## 2015-03-13 NOTE — ED Notes (Signed)
Pt came in with multiple meds, three controlled substances. Counted at this time by this RN along with pt to verify correct count, as well at Pharmacist. Paper filled out, meds given to pharmacist at this time who is walking meds down to pharmacy. Pt made aware and verbalized understanding at this time.

## 2015-03-13 NOTE — ED Notes (Signed)
Pt given meal tray at this time. Sitting up in bed eating and tolerating well, no acute distress noted.

## 2015-03-13 NOTE — ED Notes (Signed)
Pt resting comfortably in room with lights and TV turned off per patient request. Respirations even and unlabored, pt appears to be sleeping. NAD noted at this time. Will continue to monitor.

## 2015-03-13 NOTE — ED Notes (Signed)
Reports depression for 30 years but worse for 3 months since mom got sick. Pt tearful at triage. Reports thoughts about hurting herself.  Thought about to cutting wrists.

## 2015-03-13 NOTE — ED Notes (Signed)
Lab called regarding UDS add on, will add on at this time.

## 2015-03-13 NOTE — ED Notes (Signed)
Patient assigned to appropriate care area based on presenting need for behavioral health evaluation. Patient oriented to unit/care area by nursing staff. Patient informed that care areas are designed for safety and monitored by security cameras at all times in order to promote and sure safety for both them and the staff assigned to their care. Visiting hours, phone use, daily routines, meal/snack schedule explained in detail to patient. Patient verbalized understanding of the instructions and information provided to them by nursing staff and was given the opportunity to ask questions related to their individualized plan of care as it stands at this time. Patient provided a Passenger transport manager for safety to this RN and agrees to promptly notify a staff member should any changes occur that would lead to them experiencing thoughts of harming themselves or anyone else.

## 2015-03-13 NOTE — BH Assessment (Signed)
Assessment Note  [name] is an 50 y.o. female. [name] arrived to the ED with her husband. He dropped her off.  "He knows the routine".  She states for the last few days that for the last few months she has been feeling this way, but it has been getting worse. She reports feelings of depression, some anxiety and suicidal thoughts.  She states that she has been unable to work in a month, she has been crying constantly, feelings of worthlessness are identified. She reports staying in bed for approximately 19 hours a day. She reports a decrease in her self care.  She reports that her mother is very ill and she and her husband moved in to assist her mother, and after 2 weeks it was unbearable.  She is worried that her mother may have bone marrow cancer or lymphoma. Mother is currently in rehab, and she has had to clean out her mother's house, in preparation for knowing that her mother will never return back home.   She reports that she and her husband have been addressing the concerns. She reports that her doctor has been changing her medications.  She is currently seeing [name] at [facility].  She denied having auditory or visual hallucinations.  She denied having homicidal ideation or intent.  Diagnosis: Major Depression  Past Medical History:  Past Medical History  Diagnosis Date  . Hypertension   . Tobacco use   . Breast screening   . Lump or mass in breast     left  . Obesity, unspecified   . Back pain   . Screening for obesity   . Breast abscess 1993    right  . Depression     Past Surgical History  Procedure Laterality Date  . Abdominal hysterectomy  2001  . Cholecystectomy  1986  . Cesarean section  1986, 1991  . Breast biopsy Left 2013  . Breast surgery Left 2014    excision of fibroadenoma.    Family History:  Family History  Problem Relation Age of Onset  . Other Other 65    Colon Cancer, Skin Cancer  . Other Maternal Uncle 70    Prostate Cancer  .  Diabetes Mother   . Heart disease Mother   . Parkinson's disease Father   . Colon cancer Father   . Hypotension Father   . Alcohol abuse Father   . Seizures Sister   . Depression Sister   . Depression Sister     Social History:  reports that she quit smoking about 20 years ago. Her smoking use included Cigarettes. She smoked 1.00 pack per day. She has never used smokeless tobacco. She reports that she does not drink alcohol or use illicit drugs.  Additional Social History:  Alcohol / Drug Use History of alcohol / drug use?: No history of alcohol / drug abuse  CIWA: CIWA-Ar BP: 132/88 mmHg Pulse Rate: 91 COWS:    Allergies:  Allergies  Allergen Reactions  . Cyclosporine Nausea And Vomiting  . Haldol [Haloperidol Lactate] Other (See Comments)    Reaction:  Hyperactivity   . Prednisone Other (See Comments)    Reaction:  Stomach pain   . Tetracycline Rash and Other (See Comments)    Reaction:  Stomach pain    Home Medications:  (Not in a hospital admission)  OB/GYN Status:  No LMP recorded. Patient has had a hysterectomy.  General Assessment Data Location of Assessment: [facility] ED TTS Assessment: In system Is  this a Tele or Face-to-Face Assessment?: Face-to-Face Is this an Initial Assessment or a Re-assessment for this encounter?: Initial Assessment Marital status: Married [name] name: [name] Is patient pregnant?: No Pregnancy Status: No Living Arrangements: Spouse/significant other, Krystal (Adult Krystal 30, 43) Can pt return to current living arrangement?: Yes Admission Status: Involuntary Is patient capable of signing voluntary admission?: Yes Referral Source: Self/Family/Friend Insurance type: Blue cross blue shield  Medical Screening Exam ([place]) Medical Exam completed: Yes  Crisis Care Plan Living Arrangements: Spouse/significant other, Krystal (Adult Krystal 61, 20) Legal Guardian: Other: (Self) Name of Psychiatrist: Dr. Faith Jacobs Name of Therapist: Denied  Education Status Is patient currently in school?: No Current Grade: n/a Highest grade of school patient has completed: 12th Name of school: [employer] administrator person: n/a  Risk to self with the past 6 months Suicidal Ideation: Yes-Currently Present Has patient been a risk to self within the past 6 months prior to admission? : Yes Suicidal Intent: Yes-Currently Present Has patient had any suicidal intent within the past 6 months prior to admission? : Yes Is patient at risk for suicide?: Yes Suicidal Plan?: Yes-Currently Present Has patient had any suicidal plan within the past 6 months prior to admission? : Yes Specify Current Suicidal Plan: Cut wrists  Access to Means: No (Not at this time) What has been your use of drugs/alcohol within the last 12 months?: Denied use of alcohol Previous Attempts/Gestures: Yes How many times?: 1 Other Self Harm Risks: Denied Triggers for Past Attempts: Other (Comment) (losing someone) Intentional Self Injurious Behavior: None Family Suicide History: No Recent stressful life event(s): Other (Comment) (maternal illness) Persecutory voices/beliefs?: No Depression: Yes Depression Symptoms: Feeling worthless/self pity, Tearfulness Substance abuse history and/or treatment for substance abuse?: Yes Suicide prevention information given to non-admitted patients: Not applicable  Risk to Others within the past 6 months Homicidal Ideation: No Does patient have any lifetime risk of violence toward others beyond the six months prior to admission? : No Thoughts of Harm to Others: No Current Homicidal Intent: No Current Homicidal Plan: No Access to Homicidal Means: No Identified Victim: none identified History of harm to others?: No Assessment of Violence: None Noted Violent Behavior Description: denied Does patient have access to weapons?: No Criminal Charges Pending?: No Does patient have a court date: No Is  patient on probation?: No  Psychosis Hallucinations: None noted Delusions: None noted  Mental Status Report Appearance/Hygiene: In scrubs, Unremarkable Eye Contact: Poor Motor Activity: Restlessness (leg shaking through meeting) Speech: Logical/coherent Level of Consciousness: Alert Mood: Depressed Affect: Depressed Anxiety Level: Minimal Thought Processes: Coherent Judgement: Unimpaired Orientation: Place, Time, Person, Situation Obsessive Compulsive Thoughts/Behaviors: None  Cognitive Functioning Concentration: Decreased Memory: Recent Intact IQ: Average Insight: Fair Impulse Control: Fair Appetite: Poor (only wants to eat sweet things) Sleep: Increased Vegetative Symptoms: Staying in bed, Decreased grooming  ADLScreening [facility] Assessment Services) Patient's cognitive ability adequate to safely complete daily activities?: Yes Patient able to express need for assistance with ADLs?: Yes Independently performs ADLs?: Yes (appropriate for developmental age)  Prior Inpatient Therapy Prior Inpatient Therapy: Yes Prior Therapy Dates: 2014, 1994 Prior Therapy Facilty/Provider(s): [facility] Reason for Treatment: Depression  Prior Outpatient Therapy Prior Outpatient Therapy: Yes Prior Therapy Dates: Current Prior Therapy Facilty/Provider(s): [facility]-Dr Chriss Czar Reason for Treatment: Depression Does patient have an ACCT team?: No Does patient have Intensive In-House Services?  : No Does patient have [facility] services? : No Does patient have P4CC services?: No  ADL Screening (condition  at time of admission) Patient's cognitive ability adequate to safely complete daily activities?: Yes Patient able to express need for assistance with ADLs?: Yes Independently performs ADLs?: Yes (appropriate for developmental age)       Abuse/Neglect Assessment (Assessment to be complete while patient is alone) Physical Abuse: Denies Verbal Abuse: Yes, past (Comment)  (Reports father would tell her she is worthless, not worh a damn, and things like that too her. ) Sexual Abuse: Denies Exploitation of patient/patient's resources: Denies Self-Neglect: Denies Values / Beliefs Cultural Requests During Hospitalization: None Spiritual Requests During Hospitalization: None        Additional Information 1:1 In Past 12 Months?: No CIRT Risk: No Elopement Risk: No Does patient have medical clearance?: Yes     Disposition:  Disposition Initial Assessment Completed for this Encounter: Yes Disposition of Patient: Other dispositions (To be seen by the psychiatrist.)  On Site Evaluation by:   Reviewed with Physician:    Elmer Bales 03/13/2015 8:15 PM

## 2015-03-13 NOTE — ED Notes (Signed)
ED BHU [place] Is the patient under IVC or is there intent for IVC: Yes.   Is the patient medically cleared: Yes.   Is there vacancy in the ED BHU: Yes.   Is the population mix appropriate for patient: Yes.   Is the patient awaiting placement in inpatient or outpatient setting: Yes.   Has the patient had a psychiatric consult: No   Survey of unit performed for contraband, proper placement and condition of furniture, tampering with fixtures in bathroom, shower, and each patient room: Yes.  ; Findings:  APPEARANCE/BEHAVIOR Calm and cooperative. NEURO ASSESSMENT Orientation: oriented x3,denies pain Hallucinations: No.None noted (Hallucinations) Speech: Normal Gait: normal RESPIRATORY ASSESSMENT Respirations even and unlabored noted. CARDIOVASCULAR ASSESSMENT regular rate, pulses equal, skin warm and dry. GASTROINTESTINAL ASSESSMENT No GI complaints at this time. EXTREMITIES Full ROM PLAN OF CARE Provide calm/safe environment. Vital signs assessed twice daily. ED BHU Assessment once each 12-hour shift. Collaborate with intake RN daily or as condition indicates. Assure the ED provider has rounded once each shift. Provide and encourage hygiene. Provide redirection as needed. Assess for escalating behavior; address immediately and inform ED provider.  Assess family dynamic and appropriateness for visitation as needed: Yes.  ; If necessary, describe findings:  Educate the patient/family about BHU procedures/visitation: Yes.  ; If necessary, describe findings:

## 2015-03-13 NOTE — ED Notes (Signed)
CSW at bedside at this time.  

## 2015-03-13 NOTE — ED Provider Notes (Addendum)
Time Seen: Approximately 1930 I have reviewed the triage notes  Chief Complaint: Depression   History of Present Illness: [name] is a 50 y.o. female who states a long history of depression. She states her mother became ill 3 months ago and her depression seems to have accelerated. She's been in contact with her psychiatrist which is the doctor Jimmye Norman. She has been increased on her Cymbalta and was also restarted on Abilify. She states she feels like her depressions only gotten worse and now she is having some occasional suicidal thoughts. She states she thought of cutting her wrist which she has done many years ago. She denies any homicidal thoughts or hallucinations. Patient has no physical complaints at this time.   Past Medical History  Diagnosis Date  . Hypertension   . Tobacco use   . Breast screening   . Lump or mass in breast     left  . Obesity, unspecified   . Back pain   . Screening for obesity   . Breast abscess 1993    right  . Depression     Patient Active Problem List   Diagnosis Date Noted  . Morbid obesity ([place]) 01/03/2015  . Airway hyperreactivity 09/27/2014  . History of asthma 09/27/2014  . Binge eating disorder 09/27/2014  . Leg pain 09/27/2014  . Depression, major, severe recurrence ([place]) 09/27/2014  . Clinical depression 09/27/2014  . Family history of migraine 09/27/2014  . Fibromyalgia 09/27/2014  . Anxiety, generalized 09/27/2014  . HLD (hyperlipidemia) 09/27/2014  . BP (high blood pressure) 09/27/2014  . H/O: HTN (hypertension) 09/27/2014  . 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 Insomnia, persistent 09/27/2014  . Extreme obesity ([place]) 09/27/2014  . Chronic pain associated with significant psychosocial dysfunction 09/27/2014  . 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 H/O: obesity 09/27/2014  . ADD (attention deficit disorder) 09/30/2013  . Breast lump 09/14/2012  . Fibroadenoma of left breast 01/29/2012  . 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 Abdominal hysterectomy  2001  . Cholecystectomy  1986  . Cesarean section  1986, 1991  . Breast biopsy Left 2013  . Breast surgery Left 2014    excision of fibroadenoma.    Past Surgical History  Procedure Laterality Date  . Abdominal hysterectomy  2001  . Cholecystectomy  1986  . Cesarean section  1986, 1991  . Breast biopsy Left 2013  . Breast surgery Left 2014    excision of fibroadenoma.    Current Outpatient Rx  Name  Route  Sig  Dispense  Refill  . ARIPiprazole (ABILIFY) 5 MG tablet   Oral   Take 1 tablet (5 mg total) by mouth every morning.   30 tablet   1   . clonazePAM (KLONOPIN) 1 MG tablet   Oral   Take 1 tablet (1 mg total) by mouth 2 (two) times daily.   60 tablet   1   . DULoxetine (CYMBALTA) 30 MG capsule   Oral   Take 30 mg by mouth daily. Pt takes with a 60mg  capsule.         . DULoxetine (CYMBALTA) 60 MG capsule   Oral   Take 60 mg by mouth daily. Pt takes with a 30mg  capsule.         Marland Kitchen HYDROcodone-acetaminophen (NORCO/VICODIN) 5-325 MG tablet   Oral   Take 1-2 tablets by mouth every 4 (four) hours as needed for moderate pain.         Marland Kitchen  levofloxacin (LEVAQUIN) 500 MG tablet   Oral   Take 500 mg by mouth daily.         Marland Kitchen lisdexamfetamine (VYVANSE) 70 MG capsule   Oral   Take 1 capsule (70 mg total) by mouth every morning.   30 capsule   0     TO BE FILLED IN December 2016.   Marland Kitchen oxymetazoline (AFRIN) 0.05 % nasal spray   Each Nare   Place 1 spray into both nostrils 2 (two) times daily as needed for congestion.           Allergies:  Cyclosporine; Haldol; Prednisone; and Tetracycline  Family History: Family History  Problem Relation Age of Onset  . Other Other 65    Colon Cancer, Skin Cancer  . Other Maternal Uncle 70    Prostate Cancer  . Diabetes Mother   . Heart disease Mother   . Parkinson's disease Father   . Colon cancer Father   .  Hypotension Father   . Alcohol abuse Father   . Seizures Sister   . Depression Sister   . Depression Sister     Social History: Social History  Substance Use Topics  . Smoking status: Former Smoker -- 1.00 packs/day    Types: Cigarettes    Quit date: 09/27/1994  . Smokeless tobacco: Never Used  . Alcohol Use: No     Review of Systems:   10 point review of systems was performed and was otherwise negative:  Constitutional: No fever Eyes: No visual disturbances ENT: No sore throat, ear pain Cardiac: No chest pain Respiratory: No shortness of breath, wheezing, or stridor Abdomen: No abdominal pain, no vomiting, No diarrhea Endocrine: No weight loss, No night sweats Extremities: No peripheral edema, cyanosis Skin: No rashes, easy bruising Neurologic: No focal weakness, trouble with speech or swollowing Urologic: No dysuria, Hematuria, or urinary frequency   Physical Exam:  ED Triage Vitals  Enc Vitals Group     BP 03/13/15 1657 175/108 mmHg     Pulse Rate 03/13/15 1657 101     Resp 03/13/15 1657 18     Temp 03/13/15 1657 98.5 F (36.9 C)     Temp Source 03/13/15 1657 Oral     SpO2 03/13/15 1657 99 %     Weight 03/13/15 1657 260 lb (117.935 kg)     Height 03/13/15 1657 5\' 6"  (1.676 m)     Head Cir --      Peak Flow --      Pain Score --      Pain Loc --      Pain Edu? --      Excl. in [place]? --     General: Awake , Alert , and Oriented times 3; GCS 15 Head: Normal cephalic , atraumatic Eyes: Pupils equal , round, reactive to light Nose/Throat: No nasal drainage, patent upper airway without erythema or exudate.  Neck: Supple, Full range of motion, No anterior adenopathy or palpable thyroid masses Lungs: Clear to ascultation without wheezes , rhonchi, or rales Heart: Regular rate, regular rhythm without murmurs , gallops , or rubs Abdomen: Soft, non tender without rebound, guarding , or rigidity; bowel sounds positive and symmetric in all 4 quadrants. No  organomegaly .        Extremities: 2 plus symmetric pulses. No edema, clubbing or cyanosis Neurologic: normal ambulation, Motor symmetric without deficits, sensory intact Skin: warm, dry, no rashes   Labs:   All laboratory work was reviewed including any pertinent  negatives or positives listed below:  Labs Reviewed  COMPREHENSIVE METABOLIC PANEL - Abnormal; Notable for the following:    Anion gap 4 (*)    All other components within normal limits  CBC WITH DIFFERENTIAL/PLATELET - Abnormal; Notable for the following:    RBC 5.41 (*)    All other components within normal limits  URINALYSIS COMPLETEWITH MICROSCOPIC ([facility] ONLY) - Abnormal; Notable for the following:    Color, Urine YELLOW (*)    APPearance HAZY (*)    Ketones, ur TRACE (*)    Protein, ur 30 (*)    Bacteria, UA RARE (*)    Squamous Epithelial / LPF 0-5 (*)    All other components within normal limits  URINE DRUG SCREEN, QUALITATIVE ([facility] ONLY) - Abnormal; Notable for the following:    Amphetamines, Ur Screen POSITIVE (*)    Opiate, Ur Screen POSITIVE (*)    Benzodiazepine, Ur Scrn POSITIVE (*)    All other components within normal limits  ETHANOL  TSH   review of her laboratory work shows positive multi drug screen. Patient also has some hematuria without much signs of infection.  Radiology:    EXAM: CT ABDOMEN AND PELVIS WITHOUT CONTRAST  TECHNIQUE: Multidetector CT imaging of the abdomen and pelvis was performed following the standard protocol without IV contrast.  COMPARISON: CT of the abdomen and pelvis 09/04/2012.  FINDINGS: Lower chest: Unremarkable.  Hepatobiliary: No definite cystic or solid hepatic lesions are identified on today's noncontrast CT examination. Status post cholecystectomy.  Pancreas: No definite pancreatic mass or peripancreatic inflammatory changes are identified on today's noncontrast CT examination.  Spleen: Unremarkable.  Adrenals/Urinary Tract: There are no abnormal  calcifications within the collecting system of either kidney, along the course of either ureter, or within the lumen of the urinary bladder. No hydroureteronephrosis or perinephric stranding to suggest urinary tract obstruction at this time. The unenhanced appearance of the kidneys is unremarkable bilaterally. Mild bilateral perinephric stranding (nonspecific) incidentally noted. Unenhanced appearance of the urinary bladder is normal. Bilateral adrenal glands are normal in appearance.  Stomach/Bowel: Unenhanced appearance of the stomach is normal. No pathologic dilatation of small bowel or colon. Normal appendix.  Vascular/Lymphatic: Atherosclerosis throughout the abdominal and pelvic vasculature, without evidence of aneurysm. No lymphadenopathy noted in the abdomen or pelvis.  Reproductive: Status post hysterectomy. Ovaries are unremarkable in appearance.  Other: Small supraumbilical ventral hernia containing only a small amount of omental fat. No significant volume of ascites. No pneumoperitoneum.  Musculoskeletal: There are no aggressive appearing lytic or blastic lesions noted in the visualized portions of the skeleton.  IMPRESSION: 1. No explanation for the patient's history of hematuria identified on today's noncontrast CT examination. Specifically, no urinary tract calculi or findings of urinary tract obstruction are noted at this time. 2. No acute findings in the abdomen or pelvis. 3. Normal appendix. 4. Atherosclerosis. 5. Additional incidental findings, as above.     I personally reviewed the radiologic studies    ED Course: * Patient's stay here was uneventful. She had an involuntary commitment work paperwork work filled out. She is receiving consultation through psychiatric services. She isn't very cooperative here in emergency department and did not require one-on-one observation. Patient does have some hematuria, forcefully no signs on her CT for renal colic  or renal masses etc. I'll add a urine culture as her doesn't appear to be significant white blood cells in her urine at this time. Patient's currently afebrile denies any urinary complaints.    Assessment:  Suicidal ideation Hematuria unknown cause   Final Clinical Impression:   Final diagnoses:  Hematuria     Plan:  Patient will be observed and is involuntarily committed      ED ECG REPORT I, Daymon Larsen, the attending physician, personally viewed and interpreted this ECG.  Date: 03/13/2015 EKG Time: *1842 Rate: *And he for Rhythm: normal sinus rhythm QRS Axis: normal Intervals: normal ST/T Wave abnormalities: normal Conduction Disutrbances: none Narrative Interpretation: unremarkable No acute ischemic changes      Daymon Larsen, MD 03/13/15 2022  Daymon Larsen, MD 03/13/15 2028

## 2015-03-14 ENCOUNTER — Inpatient Hospital Stay
Admission: EM | Admit: 2015-03-14 | Discharge: 2015-03-16 | DRG: 885 | Disposition: A | Discharge status: Home / Self Care | Payer: BLUE CROSS/BLUE SHIELD | Source: Intra-hospital | Attending: Psychiatry | Admitting: Psychiatry

## 2015-03-14 DIAGNOSIS — M797 Fibromyalgia: Secondary | ICD-10-CM | POA: Diagnosis present

## 2015-03-14 DIAGNOSIS — Z8249 Family history of ischemic heart disease and other diseases of the circulatory system: Secondary | ICD-10-CM

## 2015-03-14 DIAGNOSIS — Z87891 Personal history of nicotine dependence: Secondary | ICD-10-CM | POA: Diagnosis not present

## 2015-03-14 DIAGNOSIS — Z915 Personal history of self-harm: Secondary | ICD-10-CM

## 2015-03-14 DIAGNOSIS — F909 Attention-deficit hyperactivity disorder, unspecified type: Secondary | ICD-10-CM | POA: Diagnosis present

## 2015-03-14 DIAGNOSIS — R45851 Suicidal ideations: Secondary | ICD-10-CM | POA: Diagnosis present

## 2015-03-14 DIAGNOSIS — Z808 Family history of malignant neoplasm of other organs or systems: Secondary | ICD-10-CM | POA: Diagnosis not present

## 2015-03-14 DIAGNOSIS — Z818 Family history of other mental and behavioral disorders: Secondary | ICD-10-CM | POA: Diagnosis not present

## 2015-03-14 DIAGNOSIS — Z833 Family history of diabetes mellitus: Secondary | ICD-10-CM | POA: Diagnosis not present

## 2015-03-14 DIAGNOSIS — Z888 Allergy status to other drugs, medicaments and biological substances status: Secondary | ICD-10-CM

## 2015-03-14 DIAGNOSIS — Z6841 Body Mass Index (BMI) 40.0 and over, adult: Secondary | ICD-10-CM | POA: Diagnosis not present

## 2015-03-14 DIAGNOSIS — F332 Major depressive disorder, recurrent severe without psychotic features: Secondary | ICD-10-CM | POA: Diagnosis present

## 2015-03-14 DIAGNOSIS — Z9049 Acquired absence of other specified parts of digestive tract: Secondary | ICD-10-CM

## 2015-03-14 DIAGNOSIS — Z90710 Acquired absence of both cervix and uterus: Secondary | ICD-10-CM | POA: Diagnosis not present

## 2015-03-14 DIAGNOSIS — Z8042 Family history of malignant neoplasm of prostate: Secondary | ICD-10-CM

## 2015-03-14 DIAGNOSIS — I10 Essential (primary) hypertension: Secondary | ICD-10-CM | POA: Diagnosis present

## 2015-03-14 DIAGNOSIS — F5081 Binge eating disorder: Secondary | ICD-10-CM | POA: Diagnosis present

## 2015-03-14 DIAGNOSIS — F329 Major depressive disorder, single episode, unspecified: Secondary | ICD-10-CM | POA: Diagnosis not present

## 2015-03-14 DIAGNOSIS — Z811 Family history of alcohol abuse and dependence: Secondary | ICD-10-CM | POA: Diagnosis not present

## 2015-03-14 DIAGNOSIS — E669 Obesity, unspecified: Secondary | ICD-10-CM | POA: Diagnosis present

## 2015-03-14 DIAGNOSIS — F50819 Binge eating disorder, unspecified: Secondary | ICD-10-CM | POA: Diagnosis present

## 2015-03-14 DIAGNOSIS — F411 Generalized anxiety disorder: Secondary | ICD-10-CM

## 2015-03-14 DIAGNOSIS — Z800 Family history of malignant neoplasm of digestive organs: Secondary | ICD-10-CM | POA: Diagnosis not present

## 2015-03-14 DIAGNOSIS — Z820 Family history of epilepsy and other diseases of the nervous system: Secondary | ICD-10-CM | POA: Diagnosis not present

## 2015-03-14 DIAGNOSIS — R319 Hematuria, unspecified: Secondary | ICD-10-CM | POA: Diagnosis present

## 2015-03-14 DIAGNOSIS — E785 Hyperlipidemia, unspecified: Secondary | ICD-10-CM | POA: Diagnosis present

## 2015-03-14 DIAGNOSIS — R030 Elevated blood-pressure reading, without diagnosis of hypertension: Secondary | ICD-10-CM | POA: Diagnosis present

## 2015-03-14 LAB — LIPID PANEL
CHOLESTEROL: 248 mg/dL — AB (ref 0–200)
HDL: 46 mg/dL (ref >40)
LDL CALC: 159 mg/dL — AB (ref 0–99)
TRIGLYCERIDES: 213 mg/dL — AB (ref <150)
Total CHOL/HDL Ratio: 5.4 RATIO
VLDL: 43 mg/dL — AB (ref 0–40)

## 2015-03-14 LAB — TSH: TSH: 0.967 u[IU]/mL (ref 0.350–4.500)

## 2015-03-14 MED ORDER — ALUM & MAG HYDROXIDE-SIMETH 200-200-20 MG/5ML PO SUSP: 30.0000 mL | ORAL | Status: DC | PRN

## 2015-03-14 MED ORDER — ACETAMINOPHEN 325 MG PO TABS
650.0000 mg | ORAL_TABLET | Freq: Four times a day (QID) | ORAL | Status: DC | PRN
  Administered 2015-03-14: 650 mg via ORAL
  Filled 2015-03-14: qty 2

## 2015-03-14 MED ORDER — DULOXETINE HCL 60 MG PO CPEP: 90.0000 mg | ORAL_CAPSULE | Freq: Every day | ORAL | Status: DC

## 2015-03-14 MED ORDER — ARIPIPRAZOLE 10 MG PO TABS
5.0000 mg | ORAL_TABLET | ORAL | Status: DC
  Administered 2015-03-15: 5 mg via ORAL
  Filled 2015-03-14: qty 1

## 2015-03-14 MED ORDER — MAGNESIUM HYDROXIDE 400 MG/5ML PO SUSP: 30.0000 mL | Freq: Every day | ORAL | Status: DC | PRN

## 2015-03-14 MED ORDER — IBUPROFEN 800 MG PO TABS
800.0000 mg | ORAL_TABLET | Freq: Once | ORAL | Status: AC
  Administered 2015-03-15: 800 mg via ORAL
  Filled 2015-03-14: qty 1

## 2015-03-14 MED ORDER — DULOXETINE HCL 30 MG PO CPEP
90.0000 mg | ORAL_CAPSULE | Freq: Every day | ORAL | Status: DC
  Administered 2015-03-15 – 2015-03-16 (×2): 90 mg via ORAL
  Filled 2015-03-14 (×2): qty 3

## 2015-03-14 MED ORDER — CLONAZEPAM 1 MG PO TABS
1.0000 mg | ORAL_TABLET | Freq: Two times a day (BID) | ORAL | Status: DC
  Administered 2015-03-14 – 2015-03-16 (×4): 1 mg via ORAL
  Filled 2015-03-14 (×4): qty 1

## 2015-03-14 MED ORDER — LISDEXAMFETAMINE DIMESYLATE 30 MG PO CAPS: 70.0000 mg | ORAL_CAPSULE | ORAL | Status: DC

## 2015-03-14 MED ORDER — LISDEXAMFETAMINE DIMESYLATE 30 MG PO CAPS
70.0000 mg | ORAL_CAPSULE | ORAL | Status: DC
  Administered 2015-03-15: 70 mg via ORAL
  Filled 2015-03-14: qty 2

## 2015-03-14 NOTE — ED Notes (Signed)
Pt resting comfortably in bed, lights and TV turned off, respirations even and unlabored. NAD noted at this time. Will continue Q 15 minute monitoring.

## 2015-03-14 NOTE — ED Notes (Signed)
Patient to be transferred to inpatient unit at [facility]. Patient currently denies SI/HI/ AVH and pain. Patient belongings sent with patient to unit. Patient sent to unit with police escort.

## 2015-03-14 NOTE — ED Provider Notes (Signed)
-----------------------------------------   6:46 AM on 03/14/2015 -----------------------------------------   Blood pressure 119/73, pulse 86, temperature 98.6 F (37 C), temperature source Oral, resp. rate 16, SpO2 97 %.  The patient had no acute events since last update.  Calm and cooperative at this time.  Disposition is pending per Psychiatry/Behavioral Medicine team recommendations.     Loney Hering, MD 03/14/15 [phone]

## 2015-03-14 NOTE — ED Notes (Signed)
Patient resting quietly in room. No noted distress or abnormal behaviors noted. Will continue 15 minute checks and observation by security camera for safety. 

## 2015-03-14 NOTE — ED Notes (Signed)
Patient asleep in room. No noted distress or abnormal behavior. Will continue 15 minute checks and observation by security cameras for safety. 

## 2015-03-14 NOTE — ED Notes (Signed)
Pt resting comfortably in bed with lights and TV turned off. No increased work in breathing noted. Will continue q 15 min monitoring. NAD noted at this time.

## 2015-03-14 NOTE — Plan of Care (Signed)
Problem: Ineffective individual coping Goal: LTG: Patient will report a decrease in negative feelings Outcome: Progressing Denies suicidal ideations

## 2015-03-14 NOTE — ED Notes (Signed)
Patient states that her main goal of treatment is to adjust her medication so that she can feel less depressed. Patient endorses hopelessness, helplessness, and a lack of energy. Patient states that she would like to be admitted to the inpatient floor. Maintained on 15 minute checks and observation by security camera for safety.

## 2015-03-14 NOTE — Progress Notes (Signed)
Admission Note:  D: Pt appeared depressed  With  a flat affect.  Pt  denies SI / AVH at this time. Patient has many  History  Of  illness fibroadenoma of left breast asthma ADD Binge ating  Leg pain both, migraines anxiety , hyperlipidemia high blood pressure insomnia obesity Chronic pain colitis  Pt is redirectable and cooperative with assessment.   Patient on unit for medication management . Suicidal Upset with mother's illness may be cancer.   A: Pt admitted to unit per protocol, skin assessment and search done and no contraband found.  Pt  educated on therapeutic milieu rules. Pt was introduced to milieu by nursing staff.    R: Pt was receptive to education about the milieu .  15 min safety checks started. Probation officer offered support

## 2015-03-14 NOTE — ED Provider Notes (Signed)
-----------------------------------------   1:07 PM on 03/14/2015 -----------------------------------------  Patient has been seen and evaluated by psychiatry there was admitted to their service for further treatment.  Harvest Dark, MD 03/14/15 [phone]

## 2015-03-14 NOTE — Progress Notes (Signed)
Patient is to be admitted to [name] by Dr. Weber Cooks.  Attending Physician will be Dr. Jerilee Hoh.   Patient has been assigned to room 314, by [place] Nurse Anderson Malta.   Intake Paper Work has been signed and placed on patient chart.  ER staff is aware of the admission [facility].; Dr. Kerman Passey , ER MD; Sharlee Blew Patient's [facility] Patient Access).   03/14/2015 Con Memos, MS, [place], [facility] Therapeutic Triage Specialist

## 2015-03-14 NOTE — ED Notes (Signed)
Pt up to bathroom and back to room with steady gait. NAD noted, no concerns voiced at this time. Will continue to monitor.

## 2015-03-14 NOTE — ED Notes (Signed)
ENVIRONMENTAL ASSESSMENT Potentially harmful objects out of patient reach: Yes Personal belongings secured: Yes Patient dressed in hospital provided attire only: Yes Plastic bags out of patient reach: Yes Patient care equipment (cords, cables, call bells, lines, and drains) shortened, removed, or accounted for: Yes Equipment and supplies removed from bottom of stretcher: Yes Potentially toxic materials out of patient reach: Yes Sharps container removed or out of patient reach: Yes  Patient in bed resting at the moment. Patient shows no signs of acute distress. Maintained on 15 minute checks and observation by security camera for safety.

## 2015-03-14 NOTE — Tx Team (Signed)
Initial Interdisciplinary Treatment Plan   PATIENT STRESSORS: Financial difficulties Health problems Medication change or noncompliance   PATIENT STRENGTHS: Ability for insight Average or above average intelligence Capable of independent living Communication skills Supportive family/friends   PROBLEM LIST: Problem List/Patient Goals Date to be addressed Date deferred Reason deferred Estimated date of resolution  Depression 03/14/15     Medication  Adjustment 03/14/15     Anxiety 03/14/15     ADD 03/14/15                                    DISCHARGE CRITERIA:  Ability to meet basic life and health needs Improved stabilization in mood, thinking, and/or behavior Safe-care adequate arrangements made  PRELIMINARY DISCHARGE PLAN: Outpatient therapy Participate in family therapy Return to previous living arrangement  PATIENT/FAMIILY INVOLVEMENT: This treatment plan has been presented to and reviewed with the patient, [name], and/or family member.  The patient and family have been given the opportunity to ask questions and make suggestions.  Dowell Hoon A Aariv Medlock 03/14/2015, 5:08 PM

## 2015-03-14 NOTE — Consult Note (Signed)
[facility] Consult   Reason for Consult:  Consult for this 50 year old woman with a history of recurrent severe depression who presents to the hospital with suicidal ideation Referring Physician:  Corky Downs Patient Identification: ASLEY [facility] MRN:  [id number] Principal Diagnosis: Depression, major, severe recurrence ([place]) Diagnosis:   Patient Active Problem List   Diagnosis Date Noted  . Suicidal ideation [R45.851] 03/14/2015  . Morbid obesity ([place]) [E66.01] 01/03/2015  . Airway hyperreactivity [J45.909] 09/27/2014  . History of asthma [Z87.09] 09/27/2014  . Binge eating disorder [F50.81] 09/27/2014  . Leg pain [M79.606] 09/27/2014  . Depression, major, severe recurrence ([place]) [F33.2] 09/27/2014  . Clinical depression [F32.9] 09/27/2014  . Family history of migraine [Z82.0] 09/27/2014  . Fibromyalgia [M79.7] 09/27/2014  . Anxiety, generalized [F41.1] 09/27/2014  . HLD (hyperlipidemia) [E78.5] 09/27/2014  . BP (high blood pressure) [I10] 09/27/2014  . 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 Insomnia, persistent [G47.00] 09/27/2014  . Extreme obesity ([place]) [E66.01] 09/27/2014  . Chronic pain associated with significant psychosocial dysfunction [G89.4] 09/27/2014  . 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 H/O: obesity [Z87.898] 09/27/2014  . ADD (attention deficit disorder) [F90.9] 09/30/2013  . Breast lump [N63] 09/14/2012  . 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 female patient admitted with "I'm having a hard time lately".  HPI:  Patient interviewed. Chart reviewed including current intake and old notes and hospital records and outpatient records. Labs reviewed medications reviewed vital signs reviewed. This 50 year old woman has a history of recurrent severe depression and says that she is coming to  the hospital because she notices she is starting to have thoughts about killing herself. Her mood has been increasingly sad and stressed out and overwhelmed for several weeks probably a few months total. She has not been able to work for the last month. Mood feels down all the time. Overwhelmed with negative thoughts. Having thoughts about killing herself by overdosing or cutting herself. She has a major stress of taking care of her mother who has cancer which is bothering her. Patient says that her sleeping habits are erratic sometimes insomniac sometimes sleeping excessively. Diet has been focused on carbohydrates. Patient denies that she's having any psychotic symptoms or hallucinations. She denies that she has been abusing drugs or alcohol. She says she has been compliant with prescribed medicine from her outpatient psychiatrist.  Social history: Patient lives with her husband and 2 adult sons. She says spontaneously make it clear that she resents their presence in the house. Patient does work outside the home at times but has not been able to work for the last month because of her depression. She is helping her family take care of her mother who appears to have cancer.  Medical history: Patient has chronic obesity, history of recurrent migraine headaches, history of chronic pain with a fibromyalgia diagnosis. Past history of high blood pressure but appears to not be suffering from that now.  Substance abuse history: Denies any history of alcohol or drug abuse denies any past history of substance abuse nothing documented.  Past Psychiatric History: Patient has a long history of depression which she says is been present for 30 years. She has had previous hospitalizations. Most recently she was here in 2014. She has been maintained for years on Prozac which eventually seemed to stop working. The current Cymbalta  has been prescribed by Dr. Jimmye Norman who is her outpatient provider. Patient does have a history  of suicide attempts but the last time was in 1985. She thinks that she might have hypomanic symptoms but apparently providers have not seen it that way. She is currently being prescribed Abilify and Vyvanse as part of her regimen to help with depression neither of which she thinks it helped.  Risk to Self: Suicidal Ideation: Yes-Currently Present Suicidal Intent: Yes-Currently Present Is patient at risk for suicide?: Yes Suicidal Plan?: Yes-Currently Present Specify Current Suicidal Plan: Cut wrists  Access to Means: No (Not at this time) What has been your use of drugs/alcohol within the last 12 months?: Denied use of alcohol How many times?: 1 Other Self Harm Risks: Denied Triggers for Past Attempts: Other (Comment) (losing someone) Intentional Self Injurious Behavior: None Risk to Others: Homicidal Ideation: No Thoughts of Harm to Others: No Current Homicidal Intent: No Current Homicidal Plan: No Access to Homicidal Means: No Identified Victim: none identified History of harm to others?: No Assessment of Violence: None Noted Violent Behavior Description: denied Does patient have access to weapons?: No Criminal Charges Pending?: No Does patient have a court date: No Prior Inpatient Therapy: Prior Inpatient Therapy: Yes Prior Therapy Dates: 2014, 1994 Prior Therapy Facilty/Provider(s): [facility] ([place]) Reason for Treatment: Depression Prior Outpatient Therapy: Prior Outpatient Therapy: Yes Prior Therapy Dates: Current Prior Therapy Facilty/Provider(s): [facility]-Dr Chriss Czar Reason for Treatment: Depression Does patient have an ACCT team?: No Does patient have Intensive In-House Services?  : No Does patient have [facility] services? : No Does patient have P4CC services?: No  Past Medical History:  Past Medical History  Diagnosis Date  . Hypertension   . Tobacco use   . Breast screening   . Lump or mass in breast     left  . Obesity, unspecified   . Back pain   .  Screening for obesity   . Breast abscess 1993    right  . Depression     Past Surgical History  Procedure Laterality Date  . Abdominal hysterectomy  2001  . Cholecystectomy  1986  . Cesarean section  1986, 1991  . Breast biopsy Left 2013  . Breast surgery Left 2014    excision of fibroadenoma.   Family History:  Family History  Problem Relation Age of Onset  . Other Other 65    Colon Cancer, Skin Cancer  . Other Maternal Uncle 70    Prostate Cancer  . Diabetes Mother   . Heart disease Mother   . Parkinson's disease Father   . Colon cancer Father   . Hypotension Father   . Alcohol abuse Father   . Seizures Sister   . Depression Sister   . Depression Sister    Family Psychiatric  History: Patient says that her father had depression and she also thinks that she has a nephew with schizophrenia and both of her sons of had mood problems. Known family history of suicide Social History:  History  Alcohol Use No     History  Drug Use No    Social History   Social History  . Marital Status: Married    Spouse Name: N/A  . Number of Children: N/A  . Years of Education: N/A   Social History Main Topics  . Smoking status: Former Smoker -- 1.00 packs/day    Types: Cigarettes    Quit date: 09/27/1994  . Smokeless tobacco: Never Used  . Alcohol Use: No  .  Drug Use: No  . Sexual Activity: Yes    Birth Control/ Protection: None   Other Topics Concern  . None   Social History Narrative   Additional Social History:    History of alcohol / drug use?: No history of alcohol / drug abuse                     Allergies:   Allergies  Allergen Reactions  . Cyclosporine Nausea And Vomiting  . Haldol [Haloperidol Lactate] Other (See Comments)    Reaction:  Hyperactivity   . Prednisone Other (See Comments)    Reaction:  Stomach pain   . Tetracycline Rash and Other (See Comments)    Reaction:  Stomach pain    Labs:  Results for orders placed or performed during  the hospital encounter of 03/13/15 (from the past 48 hour(s))  Comprehensive metabolic panel     Status: Abnormal   Collection Time: 03/13/15  5:09 PM  Result Value Ref Range   Sodium 137 135 - 145 mmol/L   Potassium 3.9 3.5 - 5.1 mmol/L   Chloride 108 101 - 111 mmol/L   CO2 25 22 - 32 mmol/L   Glucose, Bld 92 65 - 99 mg/dL   BUN 13 6 - 20 mg/dL   Creatinine, Ser 0.80 0.44 - 1.00 mg/dL   Calcium 8.9 8.9 - 10.3 mg/dL   Total Protein 7.3 6.5 - 8.1 g/dL   Albumin 4.2 3.5 - 5.0 g/dL   AST 17 15 - 41 U/L   ALT 24 14 - 54 U/L   Alkaline Phosphatase 81 38 - 126 U/L   Total Bilirubin 0.6 0.3 - 1.2 mg/dL   GFR calc non Af Amer >60 >60 mL/min   GFR calc Af Amer >60 >60 mL/min    Comment: (NOTE) The eGFR has been calculated using the CKD EPI equation. This calculation has not been validated in all clinical situations. eGFR's persistently <60 mL/min signify possible Chronic Kidney Disease.    Anion gap 4 (L) 5 - 15  CBC with Differential     Status: Abnormal   Collection Time: 03/13/15  5:09 PM  Result Value Ref Range   WBC 7.9 3.6 - 11.0 K/uL   RBC 5.41 (H) 3.80 - 5.20 MIL/uL   Hemoglobin 15.3 12.0 - 16.0 g/dL   HCT 45.2 35.0 - 47.0 %   MCV 83.6 80.0 - 100.0 fL   MCH 28.3 26.0 - 34.0 pg   MCHC 33.9 32.0 - 36.0 g/dL   RDW 13.6 11.5 - 14.5 %   Platelets 161 150 - 440 K/uL   Neutrophils Relative % 47 %   Neutro Abs 3.8 1.4 - 6.5 K/uL   Lymphocytes Relative 42 %   Lymphs Abs 3.3 1.0 - 3.6 K/uL   Monocytes Relative 7 %   Monocytes Absolute 0.5 0.2 - 0.9 K/uL   Eosinophils Relative 3 %   Eosinophils Absolute 0.3 0 - 0.7 K/uL   Basophils Relative 1 %   Basophils Absolute 0.1 0 - 0.1 K/uL  Ethanol     Status: None   Collection Time: 03/13/15  5:09 PM  Result Value Ref Range   Alcohol, Ethyl (B) <5 <5 mg/dL    Comment:        LOWEST DETECTABLE LIMIT FOR SERUM ALCOHOL IS 5 mg/dL FOR MEDICAL PURPOSES ONLY   Urinalysis complete, with microscopic ([facility] only)     Status: Abnormal    Collection Time: 03/13/15  5:09 PM  Result Value Ref Range   Color, Urine YELLOW (A) YELLOW   APPearance HAZY (A) CLEAR   Glucose, UA NEGATIVE NEGATIVE mg/dL   Bilirubin Urine NEGATIVE NEGATIVE   Ketones, ur TRACE (A) NEGATIVE mg/dL   Specific Gravity, Urine 1.026 1.005 - 1.030   Hgb urine dipstick NEGATIVE NEGATIVE   pH 5.0 5.0 - 8.0   Protein, ur 30 (A) NEGATIVE mg/dL   Nitrite NEGATIVE NEGATIVE   Leukocytes, UA NEGATIVE NEGATIVE   RBC / HPF TOO NUMEROUS TO COUNT 0 - 5 RBC/hpf   WBC, UA 0-5 0 - 5 WBC/hpf   Bacteria, UA RARE (A) NONE SEEN   Squamous Epithelial / LPF 0-5 (A) NONE SEEN   Mucous PRESENT    Hyaline Casts, UA PRESENT    Ca Oxalate Crys, UA PRESENT   TSH     Status: None   Collection Time: 03/13/15  5:09 PM  Result Value Ref Range   TSH 2.508 0.350 - 4.500 uIU/mL  Urine Drug Screen, Qualitative ([facility] only)     Status: Abnormal   Collection Time: 03/13/15  5:09 PM  Result Value Ref Range   Tricyclic, Ur Screen NONE DETECTED NONE DETECTED   Amphetamines, Ur Screen POSITIVE (A) NONE DETECTED   MDMA (Ecstasy)Ur Screen NONE DETECTED NONE DETECTED   Cocaine Metabolite,Ur [place] NONE DETECTED NONE DETECTED   Opiate, Ur Screen POSITIVE (A) NONE DETECTED   Phencyclidine (PCP) Ur S NONE DETECTED NONE DETECTED   Cannabinoid 50 Ng, Ur  NONE DETECTED NONE DETECTED   Barbiturates, Ur Screen NONE DETECTED NONE DETECTED   Benzodiazepine, Ur Scrn POSITIVE (A) NONE DETECTED   Methadone Scn, Ur NONE DETECTED NONE DETECTED    Comment: (NOTE) 962  Tricyclics, urine               Cutoff 1000 ng/mL 200  Amphetamines, urine             Cutoff 1000 ng/mL 300  MDMA (Ecstasy), urine           Cutoff 500 ng/mL 400  Cocaine Metabolite, urine       Cutoff 300 ng/mL 500  Opiate, urine                   Cutoff 300 ng/mL 600  Phencyclidine (PCP), urine      Cutoff 25 ng/mL 700  Cannabinoid, urine              Cutoff 50 ng/mL 800  Barbiturates, urine             Cutoff 200 ng/mL 900   Benzodiazepine, urine           Cutoff 200 ng/mL 1000 Methadone, urine                Cutoff 300 ng/mL 1100 1200 The urine drug screen provides only a preliminary, unconfirmed 1300 analytical test result and should not be used for non-medical 1400 purposes. Clinical consideration and professional judgment should 1500 be applied to any positive drug screen result due to possible 1600 interfering substances. A more specific alternate chemical method 1700 must be used in order to obtain a confirmed analytical result.  1800 Gas chromato graphy / mass spectrometry (GC/MS) is the preferred 1900 confirmatory method.   Urine culture     Status: None (Preliminary result)   Collection Time: 03/13/15  5:09 PM  Result Value Ref Range   Specimen Description URINE, CLEAN CATCH    Special Requests NONE  Culture NO GROWTH < 24 HOURS    Report Status PENDING     Current Facility-Administered Medications  Medication Dose Route Frequency Provider Last Rate Last Dose  . ARIPiprazole (ABILIFY) tablet 5 mg  5 mg Oral BH-q7a Daymon Larsen, MD   5 mg at 03/14/15 0754  . clonazePAM (KLONOPIN) tablet 1 mg  1 mg Oral BID Daymon Larsen, MD   1 mg at 03/14/15 0950  . [START ON 03/15/2015] DULoxetine (CYMBALTA) DR capsule 90 mg  90 mg Oral Daily Gonzella Lex, MD      . Derrill Memo ON 03/15/2015] lisdexamfetamine (VYVANSE) capsule 70 mg  70 mg Oral BH-q7a Gonzella Lex, MD       Current Outpatient Prescriptions  Medication Sig Dispense Refill  . ARIPiprazole (ABILIFY) 5 MG tablet Take 1 tablet (5 mg total) by mouth every morning. 30 tablet 1  . clonazePAM (KLONOPIN) 1 MG tablet Take 1 tablet (1 mg total) by mouth 2 (two) times daily. 60 tablet 1  . DULoxetine (CYMBALTA) 30 MG capsule Take 30 mg by mouth daily. Pt takes with a 62m capsule.    . DULoxetine (CYMBALTA) 60 MG capsule Take 60 mg by mouth daily. Pt takes with a 368mcapsule.    . Marland KitchenYDROcodone-acetaminophen (NORCO/VICODIN) 5-325 MG tablet Take 1-2  tablets by mouth every 4 (four) hours as needed for moderate pain.    . Marland Kitchenevofloxacin (LEVAQUIN) 500 MG tablet Take 500 mg by mouth daily.    . Marland Kitchenisdexamfetamine (VYVANSE) 70 MG capsule Take 1 capsule (70 mg total) by mouth every morning. 30 capsule 0  . oxymetazoline (AFRIN) 0.05 % nasal spray Place 1 spray into both nostrils 2 (two) times daily as needed for congestion.      Musculoskeletal: Strength & Muscle Tone: within normal limits Gait & Station: normal Patient leans: N/A  Psychiatric Specialty Exam: Review of Systems  Constitutional: Negative.   Eyes: Negative.   Respiratory: Negative.   Cardiovascular: Negative.   Gastrointestinal: Negative.   Musculoskeletal: Negative.   Skin: Negative.   Neurological: Positive for headaches.  Psychiatric/Behavioral: Positive for depression and suicidal ideas. Negative for hallucinations, memory loss and substance abuse. The patient is nervous/anxious and has insomnia.     Blood pressure 139/86, pulse 88, temperature 98.1 F (36.7 C), temperature source Oral, resp. rate 16, height 5' 6"  (1.676 m), weight 117.935 kg (260 lb), SpO2 98 %.Body mass index is 41.99 kg/(m^2).  General Appearance: Casual  Eye Contact::  Minimal  Speech:  Slow  Volume:  Decreased  Mood:  Depressed  Affect:  Depressed  Thought Process:  Intact  Orientation:  Full (Time, Place, and Person)  Thought Content:  Negative  Suicidal Thoughts:  Yes.  with intent/plan  Homicidal Thoughts:  No  Memory:  Immediate;   Good Recent;   Good Remote;   Fair  Judgement:  Fair  Insight:  Fair  Psychomotor Activity:  Decreased  Concentration:  Fair  Recall:  [employer] Knowledge:Fair  Language: Fair  Akathisia:  No  Handed:  Right  AIMS (if indicated):     Assets:  Communication Skills Desire for Improvement Financial Resources/Insurance Housing Resilience Social Support  ADL's:  Intact  Cognition: WNL  Sleep:      Treatment Plan Summary: Daily contact with  patient to assess and evaluate symptoms and progress in treatment, Medication management and Plan Patient with recurrent severe major depression presents with multiple symptoms of depression including suicidal thoughts with thoughts of cutting  herself or overdosing. Very dysfunctional not working regularly. Not showing psychotic symptoms. Physically appears to be stable. Vital signs normal. Her laboratory data is all unremarkable except that I do notice on her drug screen she is positive for opiates. There is an old prescription noted for opiates but she did not mention that she is taking them regularly. This probably should be addressed by inpatient team. Patient will be admitted to the psychiatric ward. Continue 15 minute checks. Continue her outpatient medicines as currently prescribed including the Cymbalta at the full 90 mg dose. Patient continues to have suicidal ideation which will require close monitoring. Depression symptoms to be treated with medication and daily individual and group psychotherapy, chronic pain to continue on the Cymbalta and can be reassessed by treatment team.  Disposition: Recommend psychiatric Inpatient admission when medically cleared. Supportive therapy provided about ongoing stressors.  Mardy Hoppe 03/14/2015 12:34 PM

## 2015-03-14 NOTE — ED Notes (Signed)
Pt ambulated with steady gait to bathroom and back to room. NAD noted at this time. No complaints at this time. Will continue Q 15 min monitoring.

## 2015-03-15 ENCOUNTER — Encounter: Payer: Self-pay | Admitting: Psychiatry

## 2015-03-15 DIAGNOSIS — E669 Obesity, unspecified: Secondary | ICD-10-CM

## 2015-03-15 DIAGNOSIS — F411 Generalized anxiety disorder: Secondary | ICD-10-CM

## 2015-03-15 DIAGNOSIS — F332 Major depressive disorder, recurrent severe without psychotic features: Principal | ICD-10-CM

## 2015-03-15 LAB — URINE CULTURE

## 2015-03-15 LAB — HEMOGLOBIN A1C: Hgb A1c MFr Bld: 5 % (ref 4.0–6.0)

## 2015-03-15 MED ORDER — BUTALBITAL-APAP-CAFFEINE 50-325-40 MG PO TABS
2.0000 | ORAL_TABLET | Freq: Two times a day (BID) | ORAL | Status: DC | PRN
  Administered 2015-03-15: 2 via ORAL
  Filled 2015-03-15: qty 2

## 2015-03-15 MED ORDER — IBUPROFEN 800 MG PO TABS
800.0000 mg | ORAL_TABLET | Freq: Three times a day (TID) | ORAL | Status: DC | PRN
  Administered 2015-03-15: 800 mg via ORAL
  Filled 2015-03-15: qty 1

## 2015-03-15 MED ORDER — IBUPROFEN 800 MG PO TABS
800.0000 mg | ORAL_TABLET | Freq: Once | ORAL | Status: AC
  Administered 2015-03-15: 800 mg via ORAL
  Filled 2015-03-15: qty 1

## 2015-03-15 MED ORDER — LISDEXAMFETAMINE DIMESYLATE 30 MG PO CAPS
70.0000 mg | ORAL_CAPSULE | ORAL | Status: DC
  Administered 2015-03-16: 70 mg via ORAL
  Filled 2015-03-15: qty 2

## 2015-03-15 MED ORDER — LEVOFLOXACIN 500 MG PO TABS
500.0000 mg | ORAL_TABLET | Freq: Every day | ORAL | Status: DC
  Administered 2015-03-15 – 2015-03-16 (×2): 500 mg via ORAL
  Filled 2015-03-15 (×2): qty 1

## 2015-03-15 MED ORDER — ARIPIPRAZOLE 10 MG PO TABS
5.0000 mg | ORAL_TABLET | ORAL | Status: DC
  Administered 2015-03-16: 5 mg via ORAL
  Filled 2015-03-15: qty 1

## 2015-03-15 MED ORDER — OXYMETAZOLINE HCL 0.05 % NA SOLN: 1.0000 | Freq: Two times a day (BID) | NASAL | Status: DC | PRN

## 2015-03-15 NOTE — Progress Notes (Signed)
D: Patient stated slept fairlast night .Stated appetite ispoorand energy level  Is normal. Stated concentration is good . Stated on Depression scale 9, hopeless 8 and anxiety 6 .( low 0-10 high) Denies suicidal  homicidal ideations  .  No auditory hallucinations  ,pain concerns . Appropriate ADL'S. Interacting with peers and staff.  Noted very needy  constanly coming to nursing station . Voice of possible discharge tomorrow . Voice of relief with mother going to an assistant living facility  . A: Encourage patient participation with unit programming . Instruction  Given on  Medication , verbalize understanding. R: Voice no other concerns. Staff continue to monitor

## 2015-03-15 NOTE — Progress Notes (Signed)
Recreation Therapy Notes  Date: 01.11.17 Time: 3:00 pm Location: Craft Room  Group Topic: Self-esteem  Goal Area(s) Addresses:  Patient will write at least one positive trait. Patient will verbalize benefit of having a healthy self-esteem.  Behavioral Response: Attentive, Interactive  Intervention: I Am  Activity: Patients were given a worksheet with the letter I and instructed to write as many positive traits inside the letter as they could.  Education: LRT educated patients on ways they can increase their self-esteem.  Education Outcome: Acknowledges education/In group clarification offered  Clinical Observations/Feedback: Patient wrote positive traits. Patient contributed to group discussion by stating that it was difficult to think of positive traits, and how her self-esteem affects her.  Leonette [facility], LRT/CTRS 03/15/2015 4:48 PM

## 2015-03-15 NOTE — Plan of Care (Signed)
Problem: Alteration in mood Goal: STG-Patient reports thoughts of self-harm to staff Outcome: Progressing Patient reported if she had the means to hurt her self she would. She also contracted for safety.

## 2015-03-15 NOTE — [facility] Group Notes (Signed)
[place] Group Notes:  (Nursing/MHT/Case Management/Adjunct)  Date:  03/15/2015  Time:  9:47 PM  Type of Therapy:  Evening Wrap-up Group  Participation Level:  Active  Participation Quality:  Appropriate and Attentive  Affect:  Appropriate  Cognitive:  Alert and Appropriate  Insight:  Improving  Engagement in Group:  Developing/Improving  Modes of Intervention:  Discussion  Summary of Progress/Problems:  Pt. spoke about meeting her goal which was to attend groups.  [name] 03/15/2015, 9:47 PM

## 2015-03-15 NOTE — Progress Notes (Signed)
D: Patient appears very depressed. Says she needs to be with her mother because her mother is dying. She has been tearful. She says her support system includes her two sons and her husband. She has had a headache since arriving on the floor. She states if she had a means of hurting herself she would. She does contract for safety though. She denies HI and denies AVH.  A: Medication for anxiety was given early to help patient relax. Medication was given for pain.  R: Patient has been cooperative with medications. Safety maintained with 15 min checks.

## 2015-03-15 NOTE — Plan of Care (Signed)
Problem: Ineffective individual coping Goal: LTG: Patient will report a decrease in negative feelings Outcome: Progressing Attending unit programing  participating

## 2015-03-15 NOTE — BHH Group Notes (Signed)
[facility] LCSW Aftercare Discharge Planning Group Note   03/15/2015 5:02 PM  Participation Quality:  Active   Mood/Affect:  Tearful  Depression Rating:  9  Anxiety Rating:  6  Thoughts of Suicide:  No Will you contract for safety?   NA  Current AVH:  NA  Plan for Discharge/Comments:  Pt plans to return home and follow up with outpatient.  She receives outpatient services at [facility] with Dr. Jimmye Norman Pt is concerned she will lose her job due to being hospitalized. During group she was tearful and report feeling very depressed. However, she states her depression has slightly improved.   Transportation Means: Family   Supports: family   [place] MSW, [place]

## 2015-03-15 NOTE — H&P (Addendum)
Psychiatric Admission Assessment Adult  Patient Identification: [name] MRN:  [id number] Date of Evaluation:  03/15/2015 Chief Complaint:  Depression major, severe reoccurence Principal Diagnosis: Severe recurrent major depression without psychotic features ([place]) Diagnosis:   Patient Active Problem List   Diagnosis Date Noted  . GAD (generalized anxiety disorder) [F41.1] 03/15/2015  . Obesity [E66.9] 03/15/2015  . Severe recurrent major depression without psychotic features ([place]) [F33.2] 03/14/2015  . Binge eating disorder [F50.81] 09/27/2014  . Fibromyalgia [M79.7] 09/27/2014  . HLD (hyperlipidemia) [E78.5] 09/27/2014  . BP (high blood pressure) [I10] 09/27/2014   History of Present Illness:  50 year old married Caucasian female with a history of depression, binge eating disorder, GAD, obesity presented to our emergency department voluntarily on January 9 voicing worsening depression and suicidal ideation.    She states her mother became very ill a year ago.  She lost a excessive amount of weight and was diagnosed with failure to thrive. A few months ago she was hospitalized [place]. From the hospital she was discharged to a nursing home for physical rehabilitation.  Today actually is the last day in the nursing home and from them she will be moving to an assisted living facility.  Patient explains that her mother was not in agreement with leaving her home and going to a supervised facility. The patient and her sisters have to make the decision for her.  She and her sister have been packing her belongings and getting things ready which has been very stressful.  Patient feels that her depression and her anxiety had worsened as a result of having to deal with these and feared that her mom could pass away at any time.  The patient has not been able to function and has taking several days off from work due to depression.  She is fearful that she might lose her job.     She is currently following up with Dr. Jimmye Norman with her outpatient psychiatrist. He recently increased Cymbalta from 60 to 90 mg and added Abilify 5 mg in order to augment her antidepressant.  Patient also was restarted on clonazepam 1 mg 3 times a day.    Today she denies having suicidal ideations.  She feels she was in crisis but the crisis is passing.  She would like to be discharged home soon as she would like to be with her mother and help her in the transition from the nursing home to the assisted living facility. She realizes that she might lose her job but is states that she is at peace with that because she feels her mother is much more important.  Spoke with Dr. Jimmye Norman who corroborates the information provided by the patient. He is states that she has worsened since December with all these issues related to her mother's health and housing. He recommends for patient to be referred to therapy as the therapists in our clinic and will be going on maternity leave.  Associated Signs/Symptoms: Depression Symptoms:  depressed mood, anxiety, loss of energy/fatigue, disturbed sleep, increased appetite, (Hypo) Manic Symptoms:  none Anxiety Symptoms:  Excessive Worry, Psychotic Symptoms:  denies PTSD Symptoms: NA   Total Time spent with patient: 1 hour  Past Psychiatric History: Patient reports one prior suicidal attempt in 1985 when she overdosed on [place]. She believes she has been hospitalized 4 times for psychiatric issues here at [facility].  She has been seen in our clinic for the last 2 years. She is currently treated  for depression and ADHD. She is taking Cymbalta 90 mg a day, Abilify 5 mg a day, Vyvanse 70 mg a day, Klonopin 1 mg 3 times a day.  Risk to Self: Is patient at risk for suicide?: No Risk to Others:  no   Past Medical History:  Past Medical History  Diagnosis Date  . Hypertension   . Tobacco use   . Breast screening   . Lump or mass in breast     left   . Obesity, unspecified   . Back pain   . Screening for obesity   . Breast abscess 1993    right  . Depression     Past Surgical History  Procedure Laterality Date  . Abdominal hysterectomy  2001  . Cholecystectomy  1986  . Cesarean section  1986, 1991  . Breast biopsy Left 2013  . Breast surgery Left 2014    excision of fibroadenoma.   Family History:  Family History  Problem Relation Age of Onset  . Other Other 65    Colon Cancer, Skin Cancer  . Other Maternal Uncle 70    Prostate Cancer  . Diabetes Mother   . Heart disease Mother   . Parkinson's disease Father   . Colon cancer Father   . Hypotension Father   . Alcohol abuse Father   . Seizures Sister   . Depression Sister   . Depression Sister    Family Psychiatric  History: No history of suicide in her family. HER-2 sons suffer from depression. She has a nephew who suffers from schizophrenia. The patient's father also suffered from depression  Social History: She lives with her husband. She has been married for more than 20 years. They have 2 children to gave their ages 25 and 68. Her sons are single patient does not have any grandchildren. She is currently working in a call center part time. She denies any history of legal problems. She denies currently having any financial stressors. History  Alcohol Use No     History  Drug Use No    Social History   Social History  . Marital Status: Married    Spouse Name: N/A  . Number of Children: N/A  . Years of Education: N/A   Social History Main Topics  . Smoking status: Former Smoker -- 1.00 packs/day    Types: Cigarettes    Quit date: 09/27/1994  . Smokeless tobacco: Never Used  . Alcohol Use: No  . Drug Use: No  . Sexual Activity: Yes    Birth Control/ Protection: None   Other Topics Concern  . None   Social History Narrative    Allergies:   Allergies  Allergen Reactions  . Cyclosporine Nausea And Vomiting  . Haldol [Haloperidol Lactate] Other  (See Comments)    Reaction:  Hyperactivity   . Prednisone Other (See Comments)    Reaction:  Stomach pain   . Tetracycline Rash and Other (See Comments)    Reaction:  Stomach pain   Lab Results:  Results for orders placed or performed during the hospital encounter of 03/14/15 (from the past 48 hour(s))  Hemoglobin A1c     Status: None   Collection Time: 03/14/15  7:01 PM  Result Value Ref Range   Hgb A1c MFr Bld 5.0 4.0 - 6.0 %  Lipid panel, fasting     Status: Abnormal   Collection Time: 03/14/15  7:01 PM  Result Value Ref Range   Cholesterol 248 (H) 0 - 200 mg/dL  Triglycerides 213 (H) <150 mg/dL   HDL 46 >40 mg/dL   Total CHOL/HDL Ratio 5.4 RATIO   VLDL 43 (H) 0 - 40 mg/dL   LDL Cholesterol 159 (H) 0 - 99 mg/dL    Comment:        Total Cholesterol/HDL:CHD Risk Coronary Heart Disease Risk Table                     Men   Women  1/2 Average Risk   3.4   3.3  Average Risk       5.0   4.4  2 X Average Risk   9.6   7.1  3 X Average Risk  23.4   11.0        Use the calculated Patient Ratio above and the CHD Risk Table to determine the patient's CHD Risk.        ATP III CLASSIFICATION (LDL):  <100     mg/dL   Optimal  100-129  mg/dL   Near or Above                    Optimal  130-159  mg/dL   Borderline  160-189  mg/dL   High  >190     mg/dL   Very High   TSH     Status: None   Collection Time: 03/14/15  7:01 PM  Result Value Ref Range   TSH 0.967 0.350 - 4.500 uIU/mL    Metabolic Disorder Labs:  Lab Results  Component Value Date   HGBA1C 5.0 03/14/2015   No results found for: PROLACTIN Lab Results  Component Value Date   CHOL 248* 03/14/2015   TRIG 213* 03/14/2015   HDL 46 03/14/2015   CHOLHDL 5.4 03/14/2015   VLDL 43* 03/14/2015   LDLCALC 159* 03/14/2015    Current Medications: Current Facility-Administered Medications  Medication Dose Route Frequency Provider Last Rate Last Dose  . acetaminophen (TYLENOL) tablet 650 mg  650 mg Oral Q6H PRN Gonzella Lex, MD   650 mg at 03/14/15 1956  . alum & mag hydroxide-simeth (MAALOX/MYLANTA) 200-200-20 MG/5ML suspension 30 mL  30 mL Oral Q4H PRN Gonzella Lex, MD      . Derrill Memo ON 03/16/2015] ARIPiprazole (ABILIFY) tablet 5 mg  5 mg Oral BH-q7a Hildred Priest, MD      . clonazePAM [facility] [place]) tablet 1 mg  1 mg Oral BID Gonzella Lex, MD   1 mg at 03/15/15 0918  . DULoxetine (CYMBALTA) DR capsule 90 mg  90 mg Oral Daily Gonzella Lex, MD   90 mg at 03/15/15 0918  . ibuprofen (ADVIL,MOTRIN) tablet 800 mg  800 mg Oral Once Hildred Priest, MD      . levofloxacin [facility]) tablet 500 mg  500 mg Oral Daily Hildred Priest, MD      . Derrill Memo ON 03/16/2015] lisdexamfetamine (VYVANSE) capsule 70 mg  70 mg Oral BH-q7a Hildred Priest, MD      . magnesium hydroxide (MILK OF MAGNESIA) suspension 30 mL  30 mL Oral Daily PRN Gonzella Lex, MD      . oxymetazoline (AFRIN) 0.05 % nasal spray 1 spray  1 spray Each Nare BID PRN Hildred Priest, MD       PTA Medications: Prescriptions prior to admission  Medication Sig Dispense Refill Last Dose  . ARIPiprazole (ABILIFY) 5 MG tablet Take 1 tablet (5 mg total) by mouth every morning. 30 tablet 1 unknown at unknown  .  clonazePAM (KLONOPIN) 1 MG tablet Take 1 tablet (1 mg total) by mouth 2 (two) times daily. 60 tablet 1 unknown at unknown  . DULoxetine (CYMBALTA) 30 MG capsule Take 30 mg by mouth daily. Pt takes with a 38m capsule.   unknown at unknown  . DULoxetine (CYMBALTA) 60 MG capsule Take 60 mg by mouth daily. Pt takes with a 360mcapsule.   unknown at unknown  . HYDROcodone-acetaminophen (NORCO/VICODIN) 5-325 MG tablet Take 1-2 tablets by mouth every 4 (four) hours as needed for moderate pain.   PRN at PRN  . levofloxacin (LEVAQUIN) 500 MG tablet Take 500 mg by mouth daily.   unknown at unknown  . lisdexamfetamine (VYVANSE) 70 MG capsule Take 1 capsule (70 mg total) by mouth every morning. 30 capsule 0 unknown  at unknown  . oxymetazoline (AFRIN) 0.05 % nasal spray Place 1 spray into both nostrils 2 (two) times daily as needed for congestion.   PRN at PRN    Musculoskeletal: Strength & Muscle Tone: within normal limits Gait & Station: normal Patient leans: N/A  Psychiatric Specialty Exam: Physical Exam  Constitutional: She is oriented to person, place, and time. She appears well-developed and well-nourished.  HENT:  Head: Normocephalic and atraumatic.  Eyes: Conjunctivae and EOM are normal.  Neck: Normal range of motion.  Respiratory: Breath sounds normal.  Musculoskeletal: Normal range of motion.  Neurological: She is alert and oriented to person, place, and time.  Skin: Skin is warm and dry.    Review of Systems  Constitutional: Negative.   HENT: Negative for congestion, ear discharge, ear pain, hearing loss, nosebleeds, sore throat and tinnitus.   Eyes: Negative.   Respiratory: Negative.  Negative for stridor.   Cardiovascular: Negative.   Gastrointestinal: Negative.   Genitourinary: Negative.   Musculoskeletal: Negative.   Skin: Negative.   Neurological: Positive for headaches.  Endo/Heme/Allergies: Negative.   Psychiatric/Behavioral: Positive for depression. Negative for suicidal ideas, hallucinations, memory loss and substance abuse. The patient is not nervous/anxious and does not have insomnia.     Blood pressure 129/88, pulse 92, temperature 98.7 F (37.1 C), temperature source Oral, resp. rate 18, height _0  (1.676 m), weight 118.389 kg (261 lb), SpO2 100 %.Body mass index is 42.15 kg/(m^2).  General Appearance: Well Groomed  EyEngineer, water  Good  Speech:  Clear and Coherent  Volume:  Normal  Mood:  Dysphoric  Affect:  Appropriate and Congruent  Thought Process:  Linear and Logical  Orientation:  Full (Time, Place, and Person)  Thought Content:  Hallucinations: None  Suicidal Thoughts:  No  Homicidal Thoughts:  No  Memory:  Immediate;   Good Recent;   Good Remote;    Good  Judgement:  Fair  Insight:  Fair  Psychomotor Activity:  Normal  Concentration:  Good  Recall:  Good  Fund of Knowledge:Good  Language: Good  Akathisia:  No  Handed:    AIMS (if indicated):     Assets:  Communication Skills Desire for Improvement Financial Resources/Insurance Housing Intimacy Social Support Talents/Skills Vocational/Educational  ADL's:  Intact  Cognition: WNL  Sleep:  Number of Hours: 8.25     Treatment Plan Summary: Daily contact with patient to assess and evaluate symptoms and progress in treatment and Medication management  [age]ear old married Caucasian female with long history of depression. Patient has been treated for more than 30 years for depression. She also suffers from generalized anxiety disorder in binge eating disorder.  Currently seeing Dr. WiJimmye Normann our clinic.  Major depressive disorder: Continue Cymbalta 90 mg by mouth daily. A week ago patient was re started on Abilify 5 mg daily to augment her antidepressant.  No changes will be made to this regimen. Patient says she is not longer suicidal. She will like to be discharged soon as she wants to be with her mother. She is fearful that her mother will pass away any time as she has been very ill for the last year.  Generalized anxiety disorder: Patient will be continue on clonazepam but I will decrease the dose from 1 mg 3 times a day to 1 mg twice a day.  Binge eating disorder: Patient has been diagnosed with binge eating disorder and is currently treated with Vyvanse 70 mg by mouth daily.  Hypertension: History of hypertension but currently blood pressure is within the normal limits. She is not currently taking any antihypertensives.  Dyslipidemia: History of elevated cholesterol however she is currently not taking any medications for this disorder.  Obesity: current BMI 41---obesity  Asthma: Patient has history of asthma. Currently is symptomatic. She is not prescribed with any  inhalers.  History of fibromyalgia: Patient is currently treated with Cymbalta for depression which has some evidence also for the treatment of fibromyalgia.  Hematuria: Positive for too many red blood cells in urine at admission. Results for presence of red blood cells in urine is unclear. CT of the abdomen and pelvis was completed and he was negative. Patient is not having any symptoms of UTI. We'll repeat UA today.   Labs: TSH and hemoglobin A1c were within the normal limits. Total cholesterol and LDL were mildly increased however patient was not fasting. UA shows bloody urine. CT of abdomen was negative. Will repeat urine today. Patient denies any symptoms of infection  We'll obtain collateral information from her husband [place]  Discharge follow-up: She will continue to follow-up with Dr. Jimmye Norman in our outpatient clinic.  We will try to make a referral to a different therapist here in town as the therapies in our outpatient clinic is going on maternity leave in the next few weeks  Discharge disposition: Will return home with family once a stable.  I certify that inpatient services furnished can reasonably be expected to improve the patient's condition.   Hildred Priest 1/11/20171:46 PM

## 2015-03-15 NOTE — Progress Notes (Signed)
Recreation Therapy Notes  INPATIENT RECREATION THERAPY ASSESSMENT  Patient Details Name: VERGIA LEISING MRN: [id number] DOB: 02/08/1966 Today's Date: 03/15/2015  Patient Stressors: Family, Death, Other (Comment) (Mother is sick; father died 2 years ago; selling mother's house and puttin gher in an assisted living facility when mother doesn't want to go)  Coping Skills:   Isolate, Avoidance, Talking, Music, Other (Comment) (Pray, eat sweets, watch movies)  Personal Challenges: Communication, Relationships, Trusting Others (Later patient reported she bottles up her emotions and she eats sweets to deal with her stress)  Leisure Interests (2+):  [employer], Individual - Other (Comment) (use the RV)  Awareness of Community Resources:  Yes  Community Resources:  Other (Comment) ([place], Tennis court)  Current Use: No  If no, Barriers?: Other (Comment) (Eats cookies instead)  Patient Strengths:  Empathetic, good problem solver  Patient Identified Areas of Improvement:  diet, not letting what other people say affect her  Current Recreation Participation:  Watch movies, bon fires  Patient Goal for Hospitalization:  To regulate medication and get back on an even-keel  [place] of Residence:  [facility] of Residence:  [place]   Current SI (including self-harm):  No  Current HI:  No  Consent to Intern Participation: N/A   Leonette Monarch, LRT/CTRS 03/15/2015, 5:38 PM

## 2015-03-15 NOTE — BHH Suicide Risk Assessment (Signed)
[facility] Admission Suicide Risk Assessment   Nursing information obtained from:    Demographic factors:    Current Mental Status:    Loss Factors:    Historical Factors:    Risk Reduction Factors:    Total Time spent with patient: 1 hour Principal Problem: <principal problem not specified> Diagnosis:   Patient Active Problem List   Diagnosis Date Noted  . Severe recurrent major depression without psychotic features ([place]) [F33.2] 03/14/2015  . Morbid obesity ([place]) [E66.01] 01/03/2015  . Airway hyperreactivity [J45.909] 09/27/2014  . History of asthma [Z87.09] 09/27/2014  . Binge eating disorder [F50.81] 09/27/2014  . Leg pain [M79.606] 09/27/2014  . Depression, major, severe recurrence ([place]) [F33.2] 09/27/2014  . Clinical depression [F32.9] 09/27/2014  . Family history of migraine [Z82.0] 09/27/2014  . Fibromyalgia [M79.7] 09/27/2014  . Anxiety, generalized [F41.1] 09/27/2014  . HLD (hyperlipidemia) [E78.5] 09/27/2014  . BP (high blood pressure) [I10] 09/27/2014  . 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 Insomnia, persistent [G47.00] 09/27/2014  . Extreme obesity ([place]) [E66.01] 09/27/2014  . Chronic pain associated with significant psychosocial dysfunction [G89.4] 09/27/2014  . 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 H/O: obesity [Z87.898] 09/27/2014  . ADD (attention deficit disorder) [F90.9] 09/30/2013  . Breast lump [N63] 09/14/2012  . 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  World Pharmacologist [facility]). Score between 0-7:  no or low risk or alcohol related problems. Score between 8-15:  moderate risk of alcohol related problems. Score between 16-19:  high risk of alcohol related problems. Score  20 or above:  warrants further diagnostic evaluation for alcohol dependence and treatment.   CLINICAL FACTORS:   Depression:   Severe Chronic Pain Previous Psychiatric Diagnoses and Treatments Medical Diagnoses and Treatments/Surgeries   Musculoskeletal:  Psychiatric Specialty Exam: Physical Exam  ROS    COGNITIVE FEATURES THAT CONTRIBUTE TO RISK:  None    SUICIDE RISK:   Mild:  Suicidal ideation of limited frequency, intensity, duration, and specificity.  There are no identifiable plans, no associated intent, mild dysphoria and related symptoms, good self-control (both objective and subjective assessment), few other risk factors, and identifiable protective factors, including available and accessible social support.  PLAN OF CARE: admit to [place] Making:  Established Problem, Worsening (2)  I certify that inpatient services furnished can reasonably be expected to improve the patient's condition.   Hildred Priest 03/15/2015, 1:21 PM

## 2015-03-16 LAB — URINALYSIS COMPLETE WITH MICROSCOPIC (ARMC ONLY)
BACTERIA UA: NONE SEEN
Bilirubin Urine: NEGATIVE
GLUCOSE, UA: NEGATIVE mg/dL
Hgb urine dipstick: NEGATIVE
Ketones, ur: NEGATIVE mg/dL
Leukocytes, UA: NEGATIVE
Nitrite: NEGATIVE
PROTEIN: NEGATIVE mg/dL
Specific Gravity, Urine: 1.014 (ref 1.005–1.030)
pH: 6 (ref 5.0–8.0)

## 2015-03-16 MED ORDER — LEVOFLOXACIN 500 MG PO TABS: 500.0000 mg | ORAL_TABLET | Freq: Every day | ORAL | Status: DC

## 2015-03-16 NOTE — Progress Notes (Signed)
Pt discharged home. DC instructions provided and explained. Medications reviewed. Rx given. All questions answered. Pt stable at discharge. Denies SI, HI, AVH. 
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
## 2015-03-16 NOTE — [facility] Group Notes (Signed)
[facility] Group Notes:  (Nursing/MHT/Case Management/Adjunct)  Date:  03/16/2015  Time:  2:17 PM  Type of Therapy:  Psychoeducational Skills  Participation Level:  Active  Participation Quality:  Appropriate  Affect:  Appropriate  Cognitive:  Appropriate  Insight:  Appropriate  Engagement in Group:  Engaged  Modes of Intervention:  Discussion and Education  Summary of Progress/Problems:  [name] 03/16/2015, 2:17 PM

## 2015-03-16 NOTE — Progress Notes (Signed)
  [facility] Adult Case Management Discharge Plan :  Will you be returning to the same living situation after discharge:  Yes,  home At discharge, do you have transportation home?: Yes,  husband Do you have the ability to pay for your medications: Yes,  insurance, income  Release of information consent forms completed and in the chart;  Patient's signature needed at discharge.  Patient to Follow up at: Follow-up Information    Schedule an appointment as soon as possible for a visit with [facility] .   Why:  Please call to make a hospital follow up appointment as soon as possible.    Contact information:   [address], [place], [place] [address] Phone: [phone]      Next level of care provider has access to [facility] and Suicide Prevention discussed: Yes,  with patient and husband   Have you used any form of tobacco in the last 30 days? (Cigarettes, Smokeless Tobacco, Cigars, and/or Pipes): No  Has patient been referred to the Quitline?: N/A patient is not a smoker  Patient has been referred for addiction treatment: [place] MSW, [place]  03/16/2015, 3:54 PM

## 2015-03-16 NOTE — Progress Notes (Signed)
D: . Appropriate ADL'S. Interacting with peers and staff. Voice of possible discharge tomorrow. Pt expressed concern over medication orders. Stated she was being "told nothing by doctors". Pt appeared confused after she verbally acknowledged taking Klonopin from this writer and then minutes later questioned receiving it twice a day. Pt was tearful off and on throughout shift. Attended group. Slept 7.0 hrs. A: Encourage patient participation with unit programming . Instruction Given on Medication , verbalize understanding. Q15 minute checks maintained for safety. R: Pt tearful but cooperative with treatment. Attended group. Staff will continue to monitor.

## 2015-03-16 NOTE — Discharge Summary (Addendum)
Physician Discharge Summary Note  Patient:  [name] is an 50 y.o., female MRN:  [id number] DOB:  12-09-1965 Patient phone:  [phone] (home)  Patient address:   [place] [address],  Total Time spent with patient: 45 minutes  Date of Admission:  03/14/2015 Date of Discharge: 03/16/15  Reason for Admission:  Suicidality  Principal Problem: Severe recurrent major depression without psychotic features [facility]) Discharge Diagnoses: Patient Active Problem List   Diagnosis Date Noted  . GAD (generalized anxiety disorder) [F41.1] 03/15/2015  . Obesity [E66.9] 03/15/2015  . Severe recurrent major depression without psychotic features ([place]) [F33.2] 03/14/2015  . Binge eating disorder [F50.81] 09/27/2014  . Fibromyalgia [M79.7] 09/27/2014  . HLD (hyperlipidemia) [E78.5] 09/27/2014  . BP (high blood pressure) [I10] 09/27/2014   History of Present Illness:  50 year old married Caucasian female with a history of depression, binge eating disorder, GAD, obesity presented to our emergency department voluntarily on January 9 voicing worsening depression and suicidal ideation.   She states her mother became very ill a year ago. She lost a excessive amount of weight and was diagnosed with failure to thrive. A few months ago she was [place] additional [place]. From the hospital she was discharged to a nursing home for physical rehabilitation. Today actually is the last day in the nursing home and from them she will be moving to an assisted living facility. Patient explains that her mother was not in agreement with leaving her home and going to a supervised facility. The patient and her sisters have to make the decision for her. She and her sister have been packing her belongings and getting things ready which has been very stressful. Patient feels that her depression and her anxiety had worsened as a result of having to deal with these and feared that her mom  could pass away at any time. The patient has not been able to function and has taking several days off from work due to depression. She is fearful that she might lose her job.   She is currently following up with Dr. Jimmye Norman with her outpatient psychiatrist. He recently increased Cymbalta from 60 to 90 mg and added Abilify 5 mg in order to augment her antidepressant. Patient also was restarted on clonazepam 1 mg 3 times a day.   Today she denies having suicidal ideations. She feels she was in crisis but the crisis is passing. She would like to be discharged home soon as she would like to be with her mother and help her in the transition from the nursing home to the assisted living facility. She realizes that she might lose her job but is states that she is at peace with that because she feels her mother is much more important.  Spoke with Dr. Jimmye Norman who corroborates the information provided by the patient. He is states that she has worsened since December with all these issues related to her mother's health and housing. He recommends for patient to be referred to therapy as the therapists in our clinic and will be going on maternity leave.  Associated Signs/Symptoms: Depression Symptoms: depressed mood, anxiety, loss of energy/fatigue, disturbed sleep, increased appetite, (Hypo) Manic Symptoms: none Anxiety Symptoms: Excessive Worry, Psychotic Symptoms: denies PTSD Symptoms: NA   Total Time spent with patient: 1 hour  Past Psychiatric History: Patient reports one prior suicidal attempt in 1985 when she overdosed on Parnate. She believes she has been hospitalized 4 times for psychiatric issues here  at [facility]. She has been seen in our clinic for the last 2 years. She is currently treated for depression and ADHD. She is taking Cymbalta 90 mg a day, Abilify 5 mg a day, Vyvanse 70 mg a day, Klonopin 1 mg 3 times a day.  Risk to Self: Is patient at risk for suicide?:  No Risk to Others:  no   Past Medical History:  Past Medical History  Diagnosis Date  . Hypertension   . Tobacco use   . Breast screening   . Lump or mass in breast     left  . Obesity, unspecified   . Back pain   . Screening for obesity   . Breast abscess 1993    right  . Depression     Past Surgical History  Procedure Laterality Date  . Abdominal hysterectomy  2001  . Cholecystectomy  1986  . Cesarean section  1986, 1991  . Breast biopsy Left 2013  . Breast surgery Left 2014    excision of fibroadenoma.   Family History:  Family History  Problem Relation Age of Onset  . Other Other 65    Colon Cancer, Skin Cancer  . Other Maternal Uncle 70    Prostate Cancer  . Diabetes Mother   . Heart disease Mother   . Parkinson's disease Father   . Colon cancer Father   . Hypotension Father   . Alcohol abuse Father   . Seizures Sister   . Depression Sister   . Depression Sister    Family Psychiatric History: No history of suicide in her family. HER-2 sons suffer from depression. She has a nephew who suffers from schizophrenia. The patient's father also suffered from depression  Social History: She lives with her husband. She has been married for more than 20 years. They have 2 children to gave their ages 16 and 61. Her sons are single patient does not have any grandchildren. She is currently working in a call center part time. She denies any history of legal problems. She denies currently having any financial stressors. History  Alcohol Use No    History  Drug Use No    Social History   Social History  . Marital Status: Married    Spouse Name: N/A  . Number of Children: N/A  . Years of Education: N/A   Social History Main Topics  . Smoking status: Former Smoker -- 1.00 packs/day    Types: Cigarettes    Quit  date: 09/27/1994  . Smokeless tobacco: Never Used  . Alcohol Use: No  . Drug Use: No  . Sexual Activity: Yes    Birth Control/ Protection: None   Other Topics Concern  . None   Social History Narrative    Allergies:  Allergies  Allergen Reactions  . Cyclosporine Nausea And Vomiting  . Haldol [Haloperidol Lactate] Other (See Comments)    Reaction: Hyperactivity   . Prednisone Other (See Comments)    Reaction: Stomach pain   . Tetracycline Rash and Other (See Comments)    Reaction: Stomach pain         Hospital Course:   50 year old married Caucasian female with long history of depression. Patient has been treated for more than 30 years for depression. She also suffers from generalized anxiety disorder in binge eating disorder. Currently seeing Dr. Jimmye Norman in our clinic.  Major depressive disorder: Continue Cymbalta 90 mg by mouth daily. A week ago patient was re started on Abilify 5 mg daily to  augment her antidepressant. No changes will be made to this regimen. Patient says she is not longer suicidal. She will like to be discharged soon as she wants to be with her mother. She is fearful that her mother will pass away any time as she has been very ill for the last year.  Generalized anxiety disorder: Patient will be continue on clonazepam but I will decrease the dose from 1 mg 3 times a day to 1 mg twice a day.  Binge eating disorder: Patient has been diagnosed with binge eating disorder and is currently treated with Vyvanse 70 mg by mouth daily.  Hypertension: History of hypertension but currently blood pressure is within the normal limits. She is not currently taking any antihypertensives.  Dyslipidemia: History of elevated cholesterol however she is currently not taking any medications for this disorder.  Obesity: current BMI 41---obesity  Asthma: Patient has history of asthma. Currently is symptomatic. She is  not prescribed with any inhalers.  History of fibromyalgia: Patient is currently treated with Cymbalta for depression which has some evidence also for the treatment of fibromyalgia.  Hematuria: Positive for too many red blood cells in urine at admission. Results for presence of red blood cells in urine is unclear. CT of the abdomen and pelvis was completed and he was negative. Patient is not having any symptoms of UTI.  Labs: TSH and hemoglobin A1c were within the normal limits. Total cholesterol and LDL were mildly increased however patient was not fasting. UA shows bloody urine. CT of abdomen was negative.   Collateral information from her husband [place]: Patient has been overwhelmed with issues related to her mother's health. He feels she is doing better now and does not have any concerns about her safety if she were discharged from the hospital today. I instructed him to remove all guns from the house he agrees.  Discharge follow-up: She will continue to follow-up with Dr. Jimmye Norman in our outpatient clinic. We will try to make a referral to a different therapist here in town as the therapies in our outpatient clinic is going on maternity leave in the next few weeks  Discharge disposition: Will return home with family today  This hospitalization was uneventful. At that time the patient was calm, pleasant and cooperative. She participated in programming. On discharge she denied SI, HI, hopelessness, helplessness or excessive guilt. She denied side effects from her medications. She denied physical complaints. She denied major problems with his sleep, appetite, energy or concentration.  There was no need for seclusion, restraints or forced medications.   Musculoskeletal: Strength & Muscle Tone: within normal limits Gait & Station: normal Patient leans: N/A  Psychiatric Specialty Exam: Review of Systems  Constitutional: Negative.   HENT: Negative.   Eyes: Negative.    Respiratory: Negative.   Cardiovascular: Negative.   Gastrointestinal: Negative.   Genitourinary: Negative.   Musculoskeletal: Negative.   Skin: Negative.   Neurological: Negative.   Endo/Heme/Allergies: Negative.   Psychiatric/Behavioral: Positive for depression. Negative for suicidal ideas, hallucinations, memory loss and substance abuse. The patient is not nervous/anxious and does not have insomnia.     Blood pressure 130/90, pulse 91, temperature 98 F (36.7 C), temperature source Oral, resp. rate 18, height 5' 6"  (1.676 m), weight 118.389 kg (261 lb), SpO2 100 %.Body mass index is 42.15 kg/(m^2).  General Appearance: Well Groomed  Engineer, water::  Good  Speech:  Clear and Coherent  Volume:  Normal  Mood:  Anxious and Dysphoric  Affect:  Appropriate and Congruent  Thought Process:  Linear and Logical  Orientation:  Full (Time, Place, and Person)  Thought Content:  Hallucinations: None  Suicidal Thoughts:  No  Homicidal Thoughts:  No  Memory:  Immediate;   Good Recent;   Good Remote;   Good  Judgement:  Good  Insight:  Good  Psychomotor Activity:  Normal  Concentration:  Good  Recall:  Good  Fund of Knowledge:Good  Language: Good  Akathisia:  No  Handed:    AIMS (if indicated):     Assets:  Communication Skills Desire for Improvement Financial Resources/Insurance Housing Intimacy Social Support  ADL's:  Intact  Cognition: WNL  Sleep:  Number of Hours: 9.79     Metabolic Disorder Labs:  Lab Results  Component Value Date   HGBA1C 5.0 03/14/2015   No results found for: PROLACTIN Lab Results  Component Value Date   CHOL 248* 03/14/2015   TRIG 213* 03/14/2015   HDL 46 03/14/2015   CHOLHDL 5.4 03/14/2015   VLDL 43* 03/14/2015   LDLCALC 159* 03/14/2015    Results for SUNSHINE, MACKOWSKI (MRN [id number]) as of 03/16/2015 09:03  Ref. Range 03/13/2015 17:09 03/13/2015 18:42 03/13/2015 19:05 03/14/2015 19:01  Sodium Latest Ref Range: 135-145 mmol/L 137     Potassium  Latest Ref Range: 3.5-5.1 mmol/L 3.9     Chloride Latest Ref Range: 101-111 mmol/L 108     CO2 Latest Ref Range: 22-32 mmol/L 25     BUN Latest Ref Range: 6-20 mg/dL 13     Creatinine Latest Ref Range: 0.44-1.00 mg/dL 0.80     Calcium Latest Ref Range: 8.9-10.3 mg/dL 8.9     EGFR (Non-African Amer.) Latest Ref Range: >60 mL/min >60     EGFR (African American) Latest Ref Range: >60 mL/min >60     Glucose Latest Ref Range: 65-99 mg/dL 92     Anion gap Latest Ref Range: 5-15  4 (L)     Alkaline Phosphatase Latest Ref Range: 38-126 U/L 81     Albumin Latest Ref Range: 3.5-5.0 g/dL 4.2     AST Latest Ref Range: 15-41 U/L 17     ALT Latest Ref Range: 14-54 U/L 24     Total Protein Latest Ref Range: 6.5-8.1 g/dL 7.3     Total Bilirubin Latest Ref Range: 0.3-1.2 mg/dL 0.6     Cholesterol Latest Ref Range: 0-200 mg/dL    248 (H)  Triglycerides Latest Ref Range: <150 mg/dL    213 (H)  HDL Cholesterol Latest Ref Range: >40 mg/dL    46  LDL (calc) Latest Ref Range: 0-99 mg/dL    159 (H)  VLDL Latest Ref Range: 0-40 mg/dL    43 (H)  Total CHOL/HDL Ratio Latest Units: RATIO    5.4  WBC Latest Ref Range: 3.6-11.0 K/uL 7.9     RBC Latest Ref Range: 3.80-5.20 MIL/uL 5.41 (H)     Hemoglobin Latest Ref Range: 12.0-16.0 g/dL 15.3     HCT Latest Ref Range: 35.0-47.0 % 45.2     MCV Latest Ref Range: 80.0-100.0 fL 83.6     MCH Latest Ref Range: 26.0-34.0 pg 28.3     MCHC Latest Ref Range: 32.0-36.0 g/dL 33.9     RDW Latest Ref Range: 11.5-14.5 % 13.6     Platelets Latest Ref Range: 150-440 K/uL 161     Neutrophils Latest Units: % 47     Lymphocytes Latest Units: % 42     Monocytes Relative Latest Units: %  7     Eosinophil Latest Units: % 3     Basophil Latest Units: % 1     NEUT# Latest Ref Range: 1.4-6.5 K/uL 3.8     Lymphocyte # Latest Ref Range: 1.0-3.6 K/uL 3.3     Monocyte # Latest Ref Range: 0.2-0.9 K/uL 0.5     Eosinophils Absolute Latest Ref Range: 0-0.7 K/uL 0.3     Basophils Absolute  Latest Ref Range: 0-0.1 K/uL 0.1     Hemoglobin A1C Latest Ref Range: 4.0-6.0 %    5.0  TSH Latest Ref Range: 0.350-4.500 uIU/mL 2.508   0.967  Appearance Latest Ref Range: CLEAR  HAZY (A)     Bacteria, UA Latest Ref Range: NONE SEEN  RARE (A)     Bilirubin Urine Latest Ref Range: NEGATIVE  NEGATIVE     Ca Oxalate Crys, UA Unknown PRESENT     Color, Urine Latest Ref Range: YELLOW  YELLOW (A)     Glucose Latest Ref Range: NEGATIVE mg/dL NEGATIVE     Hgb urine dipstick Latest Ref Range: NEGATIVE  NEGATIVE     Hyaline Casts, UA Unknown PRESENT     Ketones, ur Latest Ref Range: NEGATIVE mg/dL TRACE (A)     Leukocytes, UA Latest Ref Range: NEGATIVE  NEGATIVE     Mucous Unknown PRESENT     Nitrite Latest Ref Range: NEGATIVE  NEGATIVE     pH Latest Ref Range: 5.0-8.0  5.0     Protein Latest Ref Range: NEGATIVE mg/dL 30 (A)     RBC / HPF Latest Ref Range: 0-5 RBC/hpf TOO NUMEROUS TO C...     Specific Gravity, Urine Latest Ref Range: 1.005-1.030  1.026     Squamous Epithelial / LPF Latest Ref Range: NONE SEEN  0-5 (A)     WBC, UA Latest Ref Range: 0-5 WBC/hpf 0-5     Alcohol, Ethyl (B) Latest Ref Range: <5 mg/dL <5     Amphetamines, Ur Screen Latest Ref Range: NONE DETECTED  POSITIVE (A)     Barbiturates, Ur Screen Latest Ref Range: NONE DETECTED  NONE DETECTED     Benzodiazepine, Ur Scrn Latest Ref Range: NONE DETECTED  POSITIVE (A)     Cocaine Metabolite,Ur [place] Latest Ref Range: NONE DETECTED  NONE DETECTED     Methadone Scn, Ur Latest Ref Range: NONE DETECTED  NONE DETECTED     MDMA (Ecstasy)Ur Screen Latest Ref Range: NONE DETECTED  NONE DETECTED     Cannabinoid 50 Ng, Ur [place] Latest Ref Range: NONE DETECTED  NONE DETECTED     Opiate, Ur Screen Latest Ref Range: NONE DETECTED  POSITIVE (A)     Phencyclidine (PCP) Ur S Latest Ref Range: NONE DETECTED  NONE DETECTED     Tricyclic, Ur Screen Latest Ref Range: NONE DETECTED  NONE DETECTED      CLINICAL DATA: 50 year old female with recent  history of bilateral flank pain and hematuria. Depression.  EXAM: CT ABDOMEN AND PELVIS WITHOUT CONTRAST  TECHNIQUE: Multidetector CT imaging of the abdomen and pelvis was performed following the standard protocol without IV contrast.  COMPARISON: CT of the abdomen and pelvis 09/04/2012.  FINDINGS: Lower chest: Unremarkable.  Hepatobiliary: No definite cystic or solid hepatic lesions are identified on today's noncontrast CT examination. Status post cholecystectomy.  Pancreas: No definite pancreatic mass or peripancreatic inflammatory changes are identified on today's noncontrast CT examination.  Spleen: Unremarkable.  Adrenals/Urinary Tract: There are no abnormal calcifications within the collecting system of either kidney,  along the course of either ureter, or within the lumen of the urinary bladder. No hydroureteronephrosis or perinephric stranding to suggest urinary tract obstruction at this time. The unenhanced appearance of the kidneys is unremarkable bilaterally. Mild bilateral perinephric stranding (nonspecific) incidentally noted. Unenhanced appearance of the urinary bladder is normal. Bilateral adrenal glands are normal in appearance.  Stomach/Bowel: Unenhanced appearance of the stomach is normal. No pathologic dilatation of small bowel or colon. Normal appendix.  Vascular/Lymphatic: Atherosclerosis throughout the abdominal and pelvic vasculature, without evidence of aneurysm. No lymphadenopathy noted in the abdomen or pelvis.  Reproductive: Status post hysterectomy. Ovaries are unremarkable in appearance.  Other: Small supraumbilical ventral hernia containing only a small amount of omental fat. No significant volume of ascites. No pneumoperitoneum.  Musculoskeletal: There are no aggressive appearing lytic or blastic lesions noted in the visualized portions of the skeleton.  IMPRESSION: 1. No explanation for the patient's history of  hematuria identified on today's noncontrast CT examination. Specifically, no urinary tract calculi or findings of urinary tract obstruction are noted at this time. 2. No acute findings in the abdomen or pelvis. 3. Normal appendix. 4. Atherosclerosis. 5. Additional incidental findings, as above.    Medication List    STOP taking these medications        HYDROcodone-acetaminophen 5-325 MG tablet  Commonly known as:  NORCO/VICODIN      TAKE these medications      Indication   ARIPiprazole 5 MG tablet  Commonly known as:  ABILIFY  Take 1 tablet (5 mg total) by mouth every morning.  Notes to Patient:  depression      clonazePAM 1 MG tablet  Commonly known as:  KLONOPIN  Take 1 tablet (1 mg total) by mouth 2 (two) times daily.  Notes to Patient:  anxiety      DULoxetine 60 MG capsule  Commonly known as:  CYMBALTA  Take 60 mg by mouth daily. Pt takes with a 42m capsule.  Notes to Patient:  depression      DULoxetine 30 MG capsule  Commonly known as:  CYMBALTA  Take 30 mg by mouth daily. Pt takes with a 650mcapsule.  Notes to Patient:  depression      levofloxacin 500 MG tablet  Commonly known as:  LEVAQUIN  Take 1 tablet (500 mg total) by mouth daily.  Notes to Patient:  Sinus infection      lisdexamfetamine 70 MG capsule  Commonly known as:  VYVANSE  Take 1 capsule (70 mg total) by mouth every morning.  Notes to Patient:  Binge eating disorder      oxymetazoline 0.05 % nasal spray  Commonly known as:  AFRIN  Place 1 spray into both nostrils 2 (two) times daily as needed for congestion.  Notes to Patient:  Sinus infection         >30 minutes  Signed: HeHildred Priest/02/2016, 11:10 AM

## 2015-03-16 NOTE — Progress Notes (Signed)
Recreation Therapy Notes  INPATIENT RECREATION TR PLAN  Patient Details Name: [name] MRN: [id number] DOB: 1965/06/24 Today's Date: 03/16/2015  Rec Therapy Plan Is patient appropriate for Therapeutic Recreation?: Yes Treatment times per week: At least once a week TR Treatment/Interventions: 1:1 session, Group participation (Comment) (Appropriate participation in daily recreation therapy tx)  Discharge Criteria Pt will be discharged from therapy if:: Treatment goals are met, Discharged Treatment plan/goals/alternatives discussed and agreed upon by:: Patient/family  Discharge Summary Short term goals set: See Care Plan Short term goals met: Complete Progress toward goals comments: One-to-one attended Which groups?: Coping skills, Leisure education, Self-esteem One-to-one attended: Stress management, self-expression Reason goals not met: N/A Therapeutic equipment acquired: None Reason patient discharged from therapy: Treatment goals met Pt/family agrees with progress & goals achieved: Yes Date patient discharged from therapy: 03/16/15   Leonette Monarch, LRT/CTRS 03/16/2015, 4:52 PM

## 2015-03-16 NOTE — Tx Team (Signed)
Interdisciplinary Treatment Plan Update (Adult)  Date:  03/16/2015 Time Reviewed:  3:52 PM  Progress in Treatment: Attending groups: Yes. Participating in groups:  Yes. Taking medication as prescribed:  Yes. Tolerating medication:  Yes. Family/Significant othe contact made:  Yes, individual(s) contacted:  Arsenio Katz  Patient understands diagnosis:  Yes. Discussing patient identified problems/goals with staff:  Yes. Medical problems stabilized or resolved:  Yes. Denies suicidal/homicidal ideation: Yes. Issues/concerns per patient self-inventory:  Yes.   Other:  New problem(s) identified: No, Describe:  NA  Discharge Plan or Barriers: Pt plans to return home and follow up with outpatient.    Reason for Continuation of Hospitalization: Depression Medication stabilization Suicidal ideation  Comments: 50 year old married Caucasian female with a history of depression, binge eating disorder, GAD, obesity presented to our emergency department voluntarily on January 9 voicing worsening depression and suicidal ideation.   She states her mother became very ill a year ago. She lost a excessive amount of weight and was diagnosed with failure to thrive. A few months ago she was [place]. From the hospital she was discharged to a nursing home for physical rehabilitation. Today actually is the last day in the nursing home and from them she will be moving to an assisted living facility. Patient explains that her mother was not in agreement with leaving her home and going to a supervised facility. The patient and her sisters have to make the decision for her. She and her sister have been packing her belongings and getting things ready which has been very stressful. Patient feels that her depression and her anxiety had worsened as a result of having to deal with these and feared that her mom could pass away at any time. The patient has not been able to function  and has taking several days off from work due to depression. She is fearful that she might lose her job.   She is currently following up with Dr. Jimmye Norman with her outpatient psychiatrist. He recently increased Cymbalta from 60 to 90 mg and added Abilify 5 mg in order to augment her antidepressant. Patient also was restarted on clonazepam 1 mg 3 times a day.   Estimated length of stay: Pt will likely d/c today.   New goal(s): NA  Review of initial/current patient goals per problem list:   1.  Goal(s): Patient will participate in aftercare plan * Met:  * Target date: at discharge * As evidenced by: Patient will participate within aftercare plan AEB aftercare provider and housing plan at discharge being identified.   2.  Goal (s): Patient will exhibit decreased depressive symptoms and suicidal ideations. * Met:  *  Target date: at discharge * As evidenced by: Patient will utilize self rating of depression at 3 or below and demonstrate decreased signs of depression or be deemed stable for discharge by MD.   3.  Goal(s): Patient will demonstrate decreased signs and symptoms of anxiety. * Met:  * Target date: at discharge * As evidenced by: Patient will utilize self rating of anxiety at 3 or below and demonstrated decreased signs of anxiety, or be deemed stable for discharge by MD *  Attendees: Patient:  [name] 1/12/20173:52 PM  Family:   1/12/20173:52 PM  Physician:  Dr. Jerilee Hoh   1/12/20173:52 PM  Nursing:  Marcie Bal, RN   1/12/20173:52 PM  Case Manager:   1/12/20173:52 PM  Counselor:   1/12/20173:52 PM  Other:  Wray Kearns, LCSWA 1/12/20173:52  PM  Other:  Everitt Amber, LRT  1/12/20173:52 PM  Other:   1/12/20173:52 PM  Other:  1/12/20173:52 PM  Other:  1/12/20173:52 PM  Other:  1/12/20173:52 PM  Other:  1/12/20173:52 PM  Other:  1/12/20173:52 PM  Other:  1/12/20173:52 PM  Other:   1/12/20173:52 PM   Scribe for Treatment Team:   Wray Kearns, MSW, [place]   03/16/2015, 3:52 PM

## 2015-03-16 NOTE — Progress Notes (Signed)
Recreation Therapy Notes  Date: 01.12.17 Time: 3:00 pm Location: Craft Room  Group Topic: Leisure Education/ Coping Skills  Goal Area(s) Addresses:  Patient will identify things they are grateful for. Patient will identify how being grateful can influence decision making.  Behavioral Response: Attentive  Intervention: Immunologist  Activity: Patients were given an "I Am Grateful For" worksheet and instructed to write at least one thing they are grateful for under each category.  Education:LRT educated patients on leisure and why it is important.  Education Outcome: In group clarification offered.  Clinical Observations/Feedback: Patient wrote at least one thing she was grateful for under each category. Patient did not contribute to group discussion.  Leonette Monarch, LRT/CTRS 03/16/2015 4:23 PM

## 2015-03-16 NOTE — BHH Group Notes (Signed)
[place] LCSW Group Therapy  03/16/2015 3:55 PM  Type of Therapy:  Group Therapy  Participation Level:  Did Not Attend   Modes of Intervention:  Discussion, Education, Socialization and Support  Summary of Progress/Problems: Balance in life: Patients will discuss the concept of balance and how it looks and feels to be unbalanced. Pt will identify areas in their life that is unbalanced and ways to become more balanced.    [place] MSW, [place]  03/16/2015, 3:55 PM

## 2015-03-16 NOTE — BHH Suicide Risk Assessment (Signed)
[facility] Discharge Suicide Risk Assessment   Demographic Factors:  Caucasian  Total Time spent with patient: 30 minutes    Psychiatric Specialty Exam: Physical Exam  ROS                                                         Have you used any form of tobacco in the last 30 days? (Cigarettes, Smokeless Tobacco, Cigars, and/or Pipes): No  Has this patient used any form of tobacco in the last 30 days? (Cigarettes, Smokeless Tobacco, Cigars, and/or Pipes) No  Mental Status Per Nursing Assessment::   On Admission:     Current Mental Status by Physician: Hopeful, future oriented. Denies hopelessness or helplessness. Denies suicidality. She is calm, pleasant, cooperative. Has a supportive family  Loss Factors: mother's serious health problems  Historical Factors: Prior suicide attempts  Risk Reduction Factors:   Sense of responsibility to family, Religious beliefs about death, Living with another person, especially a relative and Positive social support  Continued Clinical Symptoms:  Previous Psychiatric Diagnoses and Treatments Medical Diagnoses and Treatments/Surgeries  Cognitive Features That Contribute To Risk:  None    Suicide Risk:  Minimal: No identifiable suicidal ideation.  Patients presenting with no risk factors but with morbid ruminations; may be classified as minimal risk based on the severity of the depressive symptoms  Principal Problem: Severe recurrent major depression without psychotic features [facility]) Discharge Diagnoses:  Patient Active Problem List   Diagnosis Date Noted  . GAD (generalized anxiety disorder) [F41.1] 03/15/2015  . Obesity [E66.9] 03/15/2015  . Severe recurrent major depression without psychotic features ([place]) [F33.2] 03/14/2015  . Binge eating disorder [F50.81] 09/27/2014  . Fibromyalgia [M79.7] 09/27/2014  . HLD (hyperlipidemia) [E78.5] 09/27/2014  . BP (high blood pressure) [I10] 09/27/2014     Is patient on  multiple antipsychotic therapies at discharge:  No   Has Patient had three or more failed trials of antipsychotic monotherapy by history:  No  Recommended Plan for Multiple Antipsychotic Therapies: NA    Hildred Priest 03/16/2015, 9:07 AM

## 2015-03-16 NOTE — Plan of Care (Signed)
Problem: [facility] Participation in Recreation Therapeutic Interventions Goal: STG-Other Recreation Therapy Goal (Specify) STG: Stress Management - Within 3 treatment sessions, patient will verbalize understanding of the stress management techniques in one treatment session to increase stress management skills post d/c.  Outcome: Completed/Met Date Met:  03/16/15 Treatment Session 1; Completed 1 out of 1: At approximately 9:35 am, LRT met with patient in patient room. LRT educated and provided patient with handouts on stress management techniques. Patient verbalized understanding. LRT encouraged patient to read over and practice the stress management techniques. Intervention Used: Stress Management handouts  Leonette Monarch, LRT/CTRS 01.12.17 2:10 pm  Problem: [facility] Participation in Recreation Therapeutic Interventions Goal: STG-Other Recreation Therapy Goal (Specify) STG: Self-expression - Within 3 treatment sessions, patient will verbalize understanding of different forms of self-expression in one treatment session to increase healthy ways to express herself post d/c.  Outcome: Completed/Met Date Met:  03/16/15 Treatment Session 1; Completed 1 out of 1: At approximately 9;35 am, LRT met with patient in patient room. LRT educated patient on different forms of self-expression. Patient verbalized understanding.  Intervention Used: Self-expression worksheets  Leonette Monarch, LRT/CTRS 01.12.17 2:12 pm

## 2015-03-16 NOTE — BHH Suicide Risk Assessment (Signed)
[facility] INPATIENT:  Family/Significant Other Suicide Prevention Education  Suicide Prevention Education:  Education Completed; Lynne Voncannon [phone] has been identified by the patient as the family member/significant other with whom the patient will be residing, and identified as the person(s) who will aid the patient in the event of a mental health crisis (suicidal ideations/suicide attempt).  With written consent from the patient, the family member/significant other has been provided the following suicide prevention education, prior to the and/or following the discharge of the patient.  The suicide prevention education provided includes the following:  Suicide risk factors  Suicide prevention and interventions  National Suicide Hotline telephone number  [facility] assessment telephone number  [facility] Emergency Assistance [place] and/or Residential Mobile Crisis Unit telephone number  Request made of family/significant other to:  Remove weapons (e.g., guns, rifles, knives), all items previously/currently identified as safety concern.    Remove drugs/medications (over-the-counter, prescriptions, illicit drugs), all items previously/currently identified as a safety concern.  The family member/significant other verbalizes understanding of the suicide prevention education information provided.  The family member/significant other agrees to remove the items of safety concern listed above. Mr. Padberg reports owning weapons but agrees to secure these weapons prior to pt's discharge.   [place] MSW, [place]  03/16/2015, 1:45 PM

## 2015-03-16 NOTE — BHH Group Notes (Signed)
[facility] Group Notes:  (Nursing/MHT/Case Management/Adjunct)  Date:  03/16/2015  Time:  9:07 AM  Type of Therapy:  Community Meeting   Participation Level:  Active  Participation Quality:  Appropriate  Affect:  Appropriate  Cognitive:  Alert and Appropriate  Insight:  Appropriate  Engagement in Group:  Engaged  Modes of Intervention:  Discussion  Summary of Progress/Problems:  [name] De'Chelle Bretta Fees 03/16/2015, 9:07 AM

## 2015-03-17 ENCOUNTER — Ambulatory Visit: Payer: BLUE CROSS/BLUE SHIELD | Admitting: Psychiatry

## 2015-03-21 ENCOUNTER — Encounter: Payer: Self-pay | Admitting: Psychiatry

## 2015-03-21 ENCOUNTER — Ambulatory Visit (INDEPENDENT_AMBULATORY_CARE_PROVIDER_SITE_OTHER): Payer: BLUE CROSS/BLUE SHIELD | Admitting: Psychiatry

## 2015-03-21 VITALS — BP 128/88 | HR 94 | Temp 97.8°F | Ht 66.0 in | Wt 266.4 lb

## 2015-03-21 DIAGNOSIS — F332 Major depressive disorder, recurrent severe without psychotic features: Secondary | ICD-10-CM

## 2015-03-21 DIAGNOSIS — F411 Generalized anxiety disorder: Secondary | ICD-10-CM | POA: Diagnosis not present

## 2015-03-21 DIAGNOSIS — F5081 Binge eating disorder: Secondary | ICD-10-CM | POA: Diagnosis not present

## 2015-03-21 MED ORDER — DULOXETINE HCL 60 MG PO CPEP
60.0000 mg | ORAL_CAPSULE | Freq: Two times a day (BID) | ORAL | Status: DC
Start: 2015-03-21 — End: 2015-04-17

## 2015-03-21 MED ORDER — LISDEXAMFETAMINE DIMESYLATE 70 MG PO CAPS: 70.0000 mg | ORAL_CAPSULE | ORAL | Status: DC

## 2015-03-21 MED ORDER — CLONAZEPAM 2 MG PO TABS: ORAL_TABLET | ORAL | Status: DC

## 2015-03-21 MED ORDER — ARIPIPRAZOLE 5 MG PO TABS: 7.5000 mg | ORAL_TABLET | ORAL | Status: DC

## 2015-03-21 NOTE — Progress Notes (Signed)
[facility] MD/PA/NP OP Progress Note  03/21/2015 9:34 AM [name]  MRN:  [id number]  Subjective:  Patient returns for follow-up of her binge eating disorder, generalized anxiety disorder and major depression. She is planning after hospitalization from January 10 through January 12 on the inpatient unit here after she had suicidal ideation. Discussion with inpatient attending, review of documentation and discussion with patient indicate that the stressor for these things was a significant decline in the patient's mother's health. Patient today states her mother became sick and they now know it is lymphoma. She stated that it over the past month she and her sisters have struggled with trying to move the mother out of the house, deal with belongings and there have been some disputes on the part of one sister about property. She indicates all this was a stressor. She states she no longer has any suicidal ideation and that ended in the first few days of the hospitalization. She states now she is taking Cymbalta 60 mg twice a day, Abilify 5 mg tablet one half a tablet in the morning and one whole tablet at bedtime and clonazepam she is taking 1 mg in the morning, 1 mg in the afternoon and 2 mg at bedtime.  She continues to go to work but also [employer] a given the issues going on with her mother and her own depression. I spent some time discussing with patient that I did think it would be good for her to get involved with therapy so that she can develop coping skills and tools for stressors as it does appear she can have exacerbation of her depression and anxiety during stressors.    Chief Complaint: Mother has lymphoma Chief Complaint    Follow-up; Medication Refill; Other     Visit Diagnosis:   No diagnosis found.  Past Medical History:  Past Medical History  Diagnosis Date  . Hypertension   . Tobacco use   . Breast screening   . Lump or mass in breast     left  . Obesity, unspecified   .  Back pain   . Screening for obesity   . Breast abscess 1993    right  . Depression     Past Surgical History  Procedure Laterality Date  . Abdominal hysterectomy  2001  . Cholecystectomy  1986  . Cesarean section  1986, 1991  . Breast biopsy Left 2013  . Breast surgery Left 2014    excision of fibroadenoma.   Family History:  Family History  Problem Relation Age of Onset  . Other Other 65    Colon Cancer, Skin Cancer  . Other Maternal Uncle 70    Prostate Cancer  . Diabetes Mother   . Heart disease Mother   . Parkinson's disease Father   . Colon cancer Father   . Hypotension Father   . Alcohol abuse Father   . Seizures Sister   . Depression Sister   . Depression Sister    Social History:  Social History   Social History  . Marital Status: Married    Spouse Name: N/A  . Number of Children: N/A  . Years of Education: N/A   Social History Main Topics  . Smoking status: Former Smoker -- 1.00 packs/day    Types: Cigarettes    Quit date: 09/27/1994  . Smokeless tobacco: Never Used  . Alcohol Use: No  . Drug Use: No  . Sexual Activity: Yes    Birth Control/ Protection: None  Other Topics Concern  . None   Social History Narrative   Additional History:   Assessment:   Musculoskeletal: Strength & Muscle Tone: within normal limits Gait & Station: normal Patient leans: N/A  Psychiatric Specialty Exam: Anxiety Symptoms include nervous/anxious behavior. Patient reports no insomnia or suicidal ideas.      Review of Systems  Psychiatric/Behavioral: Negative for depression, suicidal ideas, hallucinations, memory loss and substance abuse. The patient is nervous/anxious. The patient does not have insomnia.        Continued stress related to caring for mother  All other systems reviewed and are negative.   Blood pressure 128/88, pulse 94, temperature 97.8 F (36.6 C), temperature source Tympanic, height 5\' 6"  (1.676 m), weight 266 lb 6.4 oz (120.838 kg),  SpO2 94 %.Body mass index is 43.02 kg/(m^2).  General Appearance: Well Groomed  Eye Contact:  Good  Speech:  Normal Rate  Volume:  Normal  Mood:  Okay  Affect:  Restricted but somewhat able to smile at times about work in this writer's departure   Thought Process:  Linear and Logical  Orientation:  Full (Time, Place, and Person)  Thought Content:  Negative  Suicidal Thoughts:  No  Homicidal Thoughts:  No  Memory:  Immediate;   Good Recent;   Good Remote;   Good  Judgement:  Good  Insight:  Good  Psychomotor Activity:  Negative  Concentration:  Good  Recall:  Good  Fund of Knowledge: Good  Language: Good  Akathisia:  Negative  Handed:  Right unknown   AIMS (if indicated):  Done today  Assets:  Communication Skills Desire for Improvement Social Support Vocational/Educational  ADL's:  Intact  Cognition: WNL  Sleep:  good   Is the patient at risk to self?  No. Has the patient been a risk to self in the past 6 months?  No. Has the patient been a risk to self within the distant past?  No. Is the patient a risk to others?  No. Has the patient been a risk to others in the past 6 months?  No. Has the patient been a risk to others within the distant past?  No.  Current Medications: Current Outpatient Prescriptions  Medication Sig Dispense Refill  . ARIPiprazole (ABILIFY) 5 MG tablet Take 1.5 tablets (7.5 mg total) by mouth every morning. 30 tablet 4  . clonazePAM (KLONOPIN) 2 MG tablet One half a tablet in the morning , one half a tablet in the afternoon, and one whole tablet at bedtime 60 tablet 4  . lisdexamfetamine (VYVANSE) 70 MG capsule Take 1 capsule (70 mg total) by mouth every morning. 30 capsule 0  . DULoxetine (CYMBALTA) 60 MG capsule Take 1 capsule (60 mg total) by mouth 2 (two) times daily. 60 capsule 4   No current facility-administered medications for this visit.    Medical Decision Making:  Established Problem, Stable/Improving (1), Review of Medication Regimen  & Side Effects (2) and Review of New Medication or Change in Dosage (2)  Treatment Plan Summary:Medication management and Plan   Major  depressive disorder-  We increased the patient's duloxetine from 60 mg at bedtime to 30 mg in the morning and 60 mg at bedtime to 60 mg twice daily.  Generalized anxiety disorder-we will increase her Klonopin to 1 mg twice daily and 2 mg at bedtime.  Adjustment disorder with anxiety-lorazepam as above  Binge eating disorder-continue Vyvanse 70 mg daily.   Patient follow up in 1 month. I made patient aware  my departure and that she would transition to another provider within this clinic. She's been encouraged call any questions concerns prior to her next appointment.   Faith Rogue 03/21/2015, 9:34 AM

## 2015-03-22 ENCOUNTER — Encounter: Payer: Self-pay | Admitting: *Deleted

## 2015-03-24 ENCOUNTER — Encounter: Payer: Self-pay | Admitting: *Deleted

## 2015-03-27 NOTE — Discharge Instructions (Signed)
[facility] °[facility] °ENDOSCOPIC SINUS SURGERY °[facility] ° °What is Functional Endoscopic Sinus Surgery? ° The Surgery involves making the natural openings of the sinuses larger by removing the bony partitions that separate the sinuses from the nasal cavity.  The natural sinus lining is preserved as much as possible to allow the sinuses to resume normal function after the surgery.  In some patients nasal polyps (excessively swollen lining of the sinuses) may be removed to relieve obstruction of the sinus openings.  The surgery is performed through the nose using lighted scopes, which eliminates the need for incisions on the face.  A septoplasty is a different procedure which is sometimes performed with sinus surgery.  It involves straightening the boy partition that separates the two sides of your nose.  A crooked or deviated septum may need repair if is obstructing the sinuses or nasal airflow.  Turbinate reduction is also often performed during sinus surgery.  The turbinates are bony proturberances from the side walls of the nose which swell and can obstruct the nose in patients with sinus and allergy problems.  Their size can be surgically reduced to help relieve nasal obstruction. ° °What Can Sinus Surgery Do For Me? ° Sinus surgery can reduce the frequency of sinus infections requiring antibiotic treatment.  This can provide improvement in nasal congestion, post-nasal drainage, facial pressure and nasal obstruction.  Surgery will NOT prevent you from ever having an infection again, so it usually only for patients who get infections 4 or more times yearly requiring antibiotics, or for infections that do not clear with antibiotics.  It will not cure nasal allergies, so patients with allergies may still require medication to treat their allergies after surgery. Surgery may improve headaches related to sinusitis, however, some people will continue to  require medication to control sinus headaches related to allergies.  Surgery will do nothing for other forms of headache (migraine, tension or cluster). ° °What Are the Risks of Endoscopic Sinus Surgery? ° Current techniques allow surgery to be performed safely with little risk, however, there are rare complications that patients should be aware of.  Because the sinuses are located around the eyes, there is risk of eye injury, including blindness, though again, this would be quite rare. This is usually a result of bleeding behind the eye during surgery, which puts the vision oat risk, though there are treatments to protect the vision and prevent permanent disrupted by surgery causing a leak of the spinal fluid that surrounds the brain.  More serious complications would include bleeding inside the brain cavity or damage to the brain.  Again, all of these complications are uncommon, and spinal fluid leaks can be safely managed surgically if they occur.  The most common complication of sinus surgery is bleeding from the nose, which may require packing or cauterization of the nose.  Continued sinus have polyps may experience recurrence of the polyps requiring revision surgery.  Alterations of sense of smell or injury to the tear ducts are also rare complications.  ° °What is the Surgery Like, and what is the Recovery? ° The Surgery usually takes a couple of hours to perform, and is usually performed under a general anesthetic (completely asleep).  Patients are usually discharged home after a couple of hours.  Sometimes during surgery it is necessary to pack the nose to control bleeding, and the packing is left in place for 24 - 48 hours, and removed by your surgeon.    If a septoplasty was performed during the procedure, there is often a splint placed which must be removed after 5-7 days.   °Discomfort: Pain is usually mild to moderate, and can be controlled by prescription pain medication or acetaminophen (Tylenol).   Aspirin, Ibuprofen (Advil, Motrin), or Naprosyn (Aleve) should be avoided, as they can cause increased bleeding.  Most patients feel sinus pressure like they have a bad head cold for several days.  Sleeping with your head elevated can help reduce swelling and facial pressure, as can ice packs over the face.  A humidifier may be helpful to keep the mucous and blood from drying in the nose.  ° °Diet: There are no specific diet restrictions, however, you should generally start with clear liquids and a light diet of bland foods because the anesthetic can cause some nausea.  Advance your diet depending on how your stomach feels.  Taking your pain medication with food will often help reduce stomach upset which pain medications can cause. ° °Nasal Saline Irrigation: It is important to remove blood clots and dried mucous from the nose as it is healing.  This is done by having you irrigate the nose at least 3 - 4 times daily with a salt water solution.  We recommend using NeilMed Sinus Rinse (available at the drug store).  Fill the squeeze bottle with the solution, bend over a sink, and insert the tip of the squeeze bottle into the nose ½ of an inch.  Point the tip of the squeeze bottle towards the inside corner of the eye on the same side your irrigating.  Squeeze the bottle and gently irrigate the nose.  If you bend forward as you do this, most of the fluid will flow back out of the nose, instead of down your throat.   The solution should be warm, near body temperature, when you irrigate.   Each time you irrigate, you should use a full squeeze bottle.  ° °Note that if you are instructed to use Nasal Steroid Sprays at any time after your surgery, irrigate with saline BEFORE using the steroid spray, so you do not wash it all out of the nose. °Another product, Nasal Saline Gel (such as AYR Nasal Saline Gel) can be applied in each nostril 3 - 4 times daily to moisture the nose and reduce scabbing or crusting. ° °Bleeding:   Bloody drainage from the nose can be expected for several days, and patients are instructed to irrigate their nose frequently with salt water to help remove mucous and blood clots.  The drainage may be dark red or brown, though some fresh blood may be seen intermittently, especially after irrigation.  Do not blow you nose, as bleeding may occur. If you must sneeze, keep your mouth open to allow air to escape through your mouth. ° °If heavy bleeding occurs: Irrigate the nose with saline to rinse out clots, then spray the nose 3 - 4 times with Afrin Nasal Decongestant Spray.  The spray will constrict the blood vessels to slow bleeding.  Pinch the lower half of your nose shut to apply pressure, and lay down with your head elevated.  Ice packs over the nose may help as well. If bleeding persists despite these measures, you should notify your doctor.  Do not use the Afrin routinely to control nasal congestion after surgery, as it can result in worsening congestion and may affect healing.  ° ° ° °Activity: Return to work varies among patients. Most patients will be   out of work at least 5 - 7 days to recover.  Patient may return to work after they are off of narcotic pain medication, and feeling well enough to perform the functions of their job.  Patients must avoid heavy lifting (over 10 pounds) or strenuous physical for 2 weeks after surgery, so your employer may need to assign you to light duty, or keep you out of work longer if light duty is not possible.  NOTE: you should not drive, operate dangerous machinery, do any mentally demanding tasks or make any important legal or financial decisions while on narcotic pain medication and recovering from the general anesthetic.  °  °Call Your Doctor Immediately if You Have Any of the Following: °1. Bleeding that you cannot control with the above measures °2. Loss of vision, double vision, bulging of the eye or black eyes. °3. Fever over 101 degrees °4. Neck stiffness with  severe headache, fever, nausea and change in mental state. °You are always encourage to call anytime with concerns, however, please call with requests for pain medication refills during office hours. ° °Office Endoscopy: During follow-up visits your doctor will remove any packing or splints that may have been placed and evaluate and clean your sinuses endoscopically.  Topical anesthetic will be used to make this as comfortable as possible, though you may want to take your pain medication prior to the visit.  How often this will need to be done varies from patient to patient.  After complete recovery from the surgery, you may need follow-up endoscopy from time to time, particularly if there is concern of recurrent infection or nasal polyps. ° °General Anesthesia, Adult, Care After °Refer to this sheet in the next few weeks. These instructions provide you with information on caring for yourself after your procedure. Your health care provider may also give you more specific instructions. Your treatment has been planned according to current medical practices, but problems sometimes occur. Call your health care provider if you have any problems or questions after your procedure. °WHAT TO EXPECT AFTER THE PROCEDURE °After the procedure, it is typical to experience: °· Sleepiness. °· Nausea and vomiting. °HOME CARE INSTRUCTIONS °· For the first 24 hours after general anesthesia: °¨ Have a responsible person with you. °¨ Do not drive a car. If you are alone, do not take public transportation. °¨ Do not drink alcohol. °¨ Do not take medicine that has not been prescribed by your health care provider. °¨ Do not sign important papers or make important decisions. °¨ You may resume a normal diet and activities as directed by your health care provider. °· Change bandages (dressings) as directed. °· If you have questions or problems that seem related to general anesthesia, call the hospital and ask for the anesthetist or  anesthesiologist on call. °SEEK MEDICAL CARE IF: °· You have nausea and vomiting that continue the day after anesthesia. °· You develop a rash. °SEEK IMMEDIATE MEDICAL CARE IF:  °· You have difficulty breathing. °· You have chest pain. °· You have any allergic problems. °  °This information is not intended to replace advice given to you by your health care provider. Make sure you discuss any questions you have with your health care provider. °  °Document Released: 05/27/2000 Document Revised: 03/11/2014 Document Reviewed: 06/19/2011 °Elsevier Interactive Patient Education ©2016 [employer] ° °

## 2015-03-28 ENCOUNTER — Ambulatory Visit: Payer: BLUE CROSS/BLUE SHIELD | Admitting: Anesthesiology

## 2015-03-28 ENCOUNTER — Encounter
Admission: RE | Disposition: A | Discharge status: Home / Self Care | Payer: Self-pay | Source: Ambulatory Visit | Attending: Otolaryngology

## 2015-03-28 ENCOUNTER — Ambulatory Visit
Admission: RE | Admit: 2015-03-28 | Discharge: 2015-03-28 | Disposition: A | Discharge status: Home / Self Care | Payer: BLUE CROSS/BLUE SHIELD | Source: Ambulatory Visit | Attending: Otolaryngology | Admitting: Otolaryngology

## 2015-03-28 DIAGNOSIS — J320 Chronic maxillary sinusitis: Secondary | ICD-10-CM

## 2015-03-28 DIAGNOSIS — Z881 Allergy status to other antibiotic agents status: Secondary | ICD-10-CM | POA: Diagnosis not present

## 2015-03-28 DIAGNOSIS — Z888 Allergy status to other drugs, medicaments and biological substances status: Secondary | ICD-10-CM | POA: Diagnosis not present

## 2015-03-28 DIAGNOSIS — Z9049 Acquired absence of other specified parts of digestive tract: Secondary | ICD-10-CM | POA: Insufficient documentation

## 2015-03-28 DIAGNOSIS — Z79899 Other long term (current) drug therapy: Secondary | ICD-10-CM | POA: Diagnosis not present

## 2015-03-28 DIAGNOSIS — J322 Chronic ethmoidal sinusitis: Secondary | ICD-10-CM | POA: Diagnosis not present

## 2015-03-28 DIAGNOSIS — J45909 Unspecified asthma, uncomplicated: Secondary | ICD-10-CM | POA: Insufficient documentation

## 2015-03-28 DIAGNOSIS — E785 Hyperlipidemia, unspecified: Secondary | ICD-10-CM | POA: Diagnosis not present

## 2015-03-28 DIAGNOSIS — I10 Essential (primary) hypertension: Secondary | ICD-10-CM | POA: Diagnosis not present

## 2015-03-28 DIAGNOSIS — E669 Obesity, unspecified: Secondary | ICD-10-CM | POA: Diagnosis not present

## 2015-03-28 DIAGNOSIS — E7800 Pure hypercholesterolemia, unspecified: Secondary | ICD-10-CM | POA: Insufficient documentation

## 2015-03-28 DIAGNOSIS — Z87891 Personal history of nicotine dependence: Secondary | ICD-10-CM | POA: Diagnosis not present

## 2015-03-28 HISTORY — PX: IMAGE GUIDED SINUS SURGERY: SHX6570

## 2015-03-28 HISTORY — DX: Unspecified asthma, uncomplicated: J45.909

## 2015-03-28 HISTORY — PX: MAXILLARY ANTROSTOMY: SHX2003

## 2015-03-28 HISTORY — DX: Headache, unspecified: R51.9

## 2015-03-28 HISTORY — DX: Nausea with vomiting, unspecified: R11.2

## 2015-03-28 HISTORY — DX: Other specified behavioral and emotional disorders with onset usually occurring in childhood and adolescence: F98.8

## 2015-03-28 HISTORY — PX: ETHMOIDECTOMY: SHX5197

## 2015-03-28 HISTORY — DX: Other specified postprocedural states: Z98.890

## 2015-03-28 HISTORY — DX: Headache: R51

## 2015-03-28 SURGERY — SINUS SURGERY, WITH IMAGING GUIDANCE
Anesthesia: General | Laterality: Right | Wound class: Clean Contaminated

## 2015-03-28 MED ORDER — GLYCOPYRROLATE 0.2 MG/ML IJ SOLN
INTRAMUSCULAR | Status: DC | PRN
  Administered 2015-03-28: .1 mg via INTRAVENOUS

## 2015-03-28 MED ORDER — OXYCODONE HCL 5 MG/5ML PO SOLN
5.0000 mg | Freq: Once | ORAL | Status: AC | PRN
  Administered 2015-03-28: 5 mg via ORAL

## 2015-03-28 MED ORDER — OXYCODONE-ACETAMINOPHEN 5-325 MG PO TABS: 1.0000 | ORAL_TABLET | ORAL | Status: DC | PRN

## 2015-03-28 MED ORDER — DEXAMETHASONE SODIUM PHOSPHATE 4 MG/ML IJ SOLN
INTRAMUSCULAR | Status: DC | PRN
  Administered 2015-03-28: 10 mg via INTRAVENOUS

## 2015-03-28 MED ORDER — LIDOCAINE-EPINEPHRINE 1 %-1:100000 IJ SOLN
INTRAMUSCULAR | Status: DC | PRN
  Administered 2015-03-28: 6 mL

## 2015-03-28 MED ORDER — MIDAZOLAM HCL 5 MG/5ML IJ SOLN
INTRAMUSCULAR | Status: DC | PRN
Start: 2015-03-28 — End: 2015-03-28
  Administered 2015-03-28: 2 mg via INTRAVENOUS

## 2015-03-28 MED ORDER — LIDOCAINE HCL (CARDIAC) 20 MG/ML IV SOLN
INTRAVENOUS | Status: DC | PRN
  Administered 2015-03-28: 50 mg via INTRAVENOUS

## 2015-03-28 MED ORDER — LABETALOL HCL 5 MG/ML IV SOLN: 5.0000 mg | INTRAVENOUS | Status: DC | PRN

## 2015-03-28 MED ORDER — LACTATED RINGERS IV SOLN
INTRAVENOUS | Status: DC
  Administered 2015-03-28: 10:00:00 via INTRAVENOUS

## 2015-03-28 MED ORDER — OXYCODONE HCL 5 MG PO TABS: 5.0000 mg | ORAL_TABLET | Freq: Once | ORAL | Status: AC | PRN

## 2015-03-28 MED ORDER — PROPOFOL 10 MG/ML IV BOLUS
INTRAVENOUS | Status: DC | PRN
  Administered 2015-03-28: 150 mg via INTRAVENOUS

## 2015-03-28 MED ORDER — SUCCINYLCHOLINE CHLORIDE 20 MG/ML IJ SOLN
INTRAMUSCULAR | Status: DC | PRN
  Administered 2015-03-28: 100 mg via INTRAVENOUS

## 2015-03-28 MED ORDER — HYDROMORPHONE HCL 1 MG/ML IJ SOLN: 0.2500 mg | INTRAMUSCULAR | Status: DC | PRN

## 2015-03-28 MED ORDER — SCOPOLAMINE 1 MG/3DAYS TD PT72
1.0000 | MEDICATED_PATCH | TRANSDERMAL | Status: DC
  Administered 2015-03-28: 1.5 mg via TRANSDERMAL

## 2015-03-28 MED ORDER — ONDANSETRON HCL 4 MG/2ML IJ SOLN
INTRAMUSCULAR | Status: DC | PRN
  Administered 2015-03-28: 4 mg via INTRAVENOUS

## 2015-03-28 MED ORDER — LABETALOL HCL 5 MG/ML IV SOLN
10.0000 mg | Freq: Once | INTRAVENOUS | Status: AC
  Administered 2015-03-28: 5 mg via INTRAVENOUS

## 2015-03-28 MED ORDER — EPHEDRINE SULFATE 50 MG/ML IJ SOLN
INTRAMUSCULAR | Status: DC | PRN
  Administered 2015-03-28 (×2): 10 mg via INTRAVENOUS
  Administered 2015-03-28 (×2): 5 mg via INTRAVENOUS

## 2015-03-28 MED ORDER — PHENYLEPHRINE HCL 10 MG/ML IJ SOLN
INTRAMUSCULAR | Status: DC | PRN
  Administered 2015-03-28 (×4): 50 ug via INTRAVENOUS

## 2015-03-28 MED ORDER — ONDANSETRON HCL 4 MG/2ML IJ SOLN: 4.0000 mg | Freq: Once | INTRAMUSCULAR | Status: DC | PRN

## 2015-03-28 MED ORDER — FENTANYL CITRATE (PF) 100 MCG/2ML IJ SOLN
INTRAMUSCULAR | Status: DC | PRN
  Administered 2015-03-28: 100 ug via INTRAVENOUS

## 2015-03-28 MED ORDER — LACTATED RINGERS IV SOLN: 500.0000 mL | INTRAVENOUS | Status: DC

## 2015-03-28 MED ORDER — ACETAMINOPHEN 10 MG/ML IV SOLN
1000.0000 mg | Freq: Once | INTRAVENOUS | Status: AC
  Administered 2015-03-28: 1000 mg via INTRAVENOUS

## 2015-03-28 MED ORDER — OXYMETAZOLINE HCL 0.05 % NA SOLN
NASAL | Status: DC | PRN
  Administered 2015-03-28: 1 via TOPICAL

## 2015-03-28 MED ORDER — LIDOCAINE HCL 4 % MT SOLN
OROMUCOSAL | Status: DC | PRN
  Administered 2015-03-28: 4 mL via TOPICAL

## 2015-03-28 SURGICAL SUPPLY — 26 items
BALLOON SINUPLASTY SYSTEM (BALLOONS) ×4 IMPLANT
BATTERY INSTRU NAVIGATION (MISCELLANEOUS) ×16 IMPLANT
BLADE IRRIGATOR 40D CVD (IRRIGATION / IRRIGATOR) IMPLANT
CANISTER SUCT 1200ML W/VALVE (MISCELLANEOUS) ×4 IMPLANT
COAG SUCT 10F 3.5MM HAND CTRL (MISCELLANEOUS) ×4 IMPLANT
DEVICE INFLATION SEID (MISCELLANEOUS) IMPLANT
DRAPE HEAD BAR (DRAPES) ×4 IMPLANT
DRESSING NASL FOAM PST OP SINU (MISCELLANEOUS) IMPLANT
DRSG NASAL 4CM NASOPORE (MISCELLANEOUS) IMPLANT
DRSG NASAL FOAM POST OP SINU (MISCELLANEOUS)
GLOVE BIO SURGEON STRL SZ7.5 (GLOVE) ×8 IMPLANT
IRRIGATOR 4MM STR (IRRIGATION / IRRIGATOR) ×4 IMPLANT
IV NS 500ML (IV SOLUTION) ×2
IV NS 500ML BAXH (IV SOLUTION) ×2 IMPLANT
KIT ROOM TURNOVER OR (KITS) ×4 IMPLANT
NAVIGATION MASK REG  ST (MISCELLANEOUS) ×4 IMPLANT
NS IRRIG 500ML POUR BTL (IV SOLUTION) ×4 IMPLANT
PACK DRAPE NASAL/ENT (PACKS) ×4 IMPLANT
PACKING NASAL EPIS 4X2.4 XEROG (MISCELLANEOUS) IMPLANT
PAD GROUND ADULT SPLIT (MISCELLANEOUS) ×4 IMPLANT
PATTIES SURGICAL .5 X3 (DISPOSABLE) ×4 IMPLANT
SET HANDPIECE IRR DIEGO (MISCELLANEOUS) ×4 IMPLANT
SOL ANTI-FOG 6CC FOG-OUT (MISCELLANEOUS) ×2 IMPLANT
SOL FOG-OUT ANTI-FOG 6CC (MISCELLANEOUS) ×2
SYRINGE 10CC LL (SYRINGE) ×4 IMPLANT
WATER STERILE IRR 500ML POUR (IV SOLUTION) IMPLANT

## 2015-03-28 NOTE — Anesthesia Postprocedure Evaluation (Signed)
Anesthesia Post Note  Patient: [name]  Procedure(s) Performed: Procedure(s) (LRB): IMAGE GUIDED SINUS SURGERY (N/A) MAXILLARY ANTROSTOMY (Right) RIGHT ANTERIOR ETHMOIDECTOMY (Right)  Patient location during evaluation: PACU Anesthesia Type: General Level of consciousness: awake and alert Pain management: pain level controlled Vital Signs Assessment: post-procedure vital signs reviewed and stable Respiratory status: spontaneous breathing, nonlabored ventilation and respiratory function stable Cardiovascular status: blood pressure returned to baseline and stable Postop Assessment: no signs of nausea or vomiting Anesthetic complications: no    Scott Fix D Alya Smaltz

## 2015-03-28 NOTE — Anesthesia Procedure Notes (Signed)
Procedure Name: Intubation Date/Time: 03/28/2015 10:05 AM Performed by: Mayme Genta Pre-anesthesia Checklist: Patient identified, Emergency Drugs available, Suction available, Patient being monitored and Timeout performed Patient Re-evaluated:Patient Re-evaluated prior to inductionOxygen Delivery Method: Circle system utilized Preoxygenation: Pre-oxygenation with 100% oxygen Intubation Type: IV induction Ventilation: Mask ventilation without difficulty Laryngoscope Size: Miller and 2 Grade View: Grade I Tube type: Oral Rae Tube size: 7.0 mm Number of attempts: 1 Placement Confirmation: ETT inserted through vocal cords under direct vision,  positive ETCO2 and breath sounds checked- equal and bilateral Tube secured with: Tape Dental Injury: Teeth and Oropharynx as per pre-operative assessment

## 2015-03-28 NOTE — Op Note (Signed)
03/28/2015  10:55 AM    [name]  [id number]   Pre-Op Diagnosis:  CHRONIC RIGHT MAXILLARY AND ETHMOID SINUSITIS Post-op Diagnosis: CHRONIC RIGHT MAXILLARY AND ETHMOID SINUSITIS  Procedure:  1)  Image Guided Sinus Surgery,   2)  Right Endoscopic Maxillary Antrostomy with Tissue Removal   3)  Right Anterior Ethmoidectomy    Surgeon:  Riley Nearing  Anesthesia:  General endotracheal  EBL:  50 cc  Complications:  None  Findings: Polypoid mucosal thickening obstructing the right maxillary sinus with mild to moderate mucosal thickening in the anterior ethmoids. Purulent secretions in the right maxillary sinus were cultured  Procedure: After the patient was identified in holding and the benefits of the procedure were reviewed as well as the consent and risks, the patient was taken to the operating room and with the patient in a comfortable supine position,  general orotracheal anesthesia was induced without difficulty.  A proper time-out was performed.  The Stryker image guidance system was set up and calibrated in the normal fashion and felt to be acceptable.  Next 1% Xylocaine with 1:100,000 epinephrine was infiltrated into the right middle meatus and anterior middle turbinate.  Several minutes were allowed for this to take effect.  Cottoniod pledgets soaked in Afrin were placed into the right nasal cavity and left while the patient was prepped and draped in the standard fashion. The image guided suction was calibrated and used to inspect known points in the nasal cavity to assess accuracy of the image guided system. Accuracy was felt to be excellent.   The right middle turbinate was medialized and the uncinate process then resected with through-cutting forceps as well as the microdebrider. In this fashion the uncinate was completely removed along with soft tissue and bone of the medial wall of the maxillary sinus to create a large patent maxillary antrostomy. Purulent mucus was  cultured from the right maxillary sinus. The right maxillary sinus was irrigated with sterile saline and suctioned to clear secretions.   Next the right anterior ethmoid sinuses were dissected beginning inferomedially, entering the ethmoid bulla. Thru cut forceps were used to open the anterior ethmoids. The microdebrider was used as needed to trim loose mucosal edges.  The nose was suctioned and inspected. Minor bleeding was controlled with the suction cautery. Stammberger absorbable sinus packing was then placed in the right ethmoid cavity and middle meatus.   The patient was then returned to the anesthesiologist for awakening and taken to recovery room in good condition postoperatively.  Disposition:   PACU and d/c home  Plan: Ice, elevation, narcotic analgesia and antibiotics. Begin sinus irrigations with saline tomrrow, irrigating 3-4 times daily. Return to the office in 7 days.  Return to work in 7-10 days, no strenuous activities for two weeks.   Riley Nearing 03/28/2015 10:55 AM

## 2015-03-28 NOTE — Anesthesia Preprocedure Evaluation (Signed)
Anesthesia Evaluation  Patient identified by MRN, date of birth, ID band Patient awake    Reviewed: Allergy & Precautions, H&P , NPO status , Patient's Chart, lab work & pertinent test results, reviewed documented beta blocker date and time   Airway Mallampati: II  TM Distance: >3 FB Neck ROM: full    Dental no notable dental hx.    Pulmonary asthma , former smoker,    Pulmonary exam normal breath sounds clear to auscultation       Cardiovascular Exercise Tolerance: Good hypertension, On Medications  Rhythm:regular Rate:Normal     Neuro/Psych  Headaches, PSYCHIATRIC DISORDERS  Neuromuscular disease    GI/Hepatic negative GI ROS, Neg liver ROS,   Endo/Other  negative endocrine ROS  Renal/GU negative Renal ROS  negative genitourinary   Musculoskeletal   Abdominal   Peds  Hematology negative hematology ROS (+)   Anesthesia Other Findings   Reproductive/Obstetrics negative OB ROS                             Anesthesia Physical Anesthesia Plan  ASA: II  Anesthesia Plan: General   Post-op Pain Management:    Induction:   Airway Management Planned:   Additional Equipment:   Intra-op Plan:   Post-operative Plan:   Informed Consent: I have reviewed the patients History and Physical, chart, labs and discussed the procedure including the risks, benefits and alternatives for the proposed anesthesia with the patient or authorized representative who has indicated his/her understanding and acceptance.     Plan Discussed with: CRNA  Anesthesia Plan Comments:         Anesthesia Quick Evaluation

## 2015-03-28 NOTE — H&P (Signed)
History and physical reviewed and will be scanned in later. No change in medical status reported by the patient or family, appears stable for surgery. All questions regarding the procedure answered, and patient (or family if a child) expressed understanding of the procedure.  Dimond Crotty S @TODAY@ 

## 2015-03-28 NOTE — Addendum Note (Signed)
Addendum  created 03/28/15 1144 by Esmeralda Links, MD   Modules edited: Orders

## 2015-03-28 NOTE — Transfer of Care (Signed)
Immediate Anesthesia Transfer of Care Note  Patient: [name]  Procedure(s) Performed: Procedure(s) with comments: [place] (N/A) - STRYKER gave disk to cece MAXILLARY ANTROSTOMY (Right) RIGHT ANTERIOR ETHMOIDECTOMY (Right)  Patient Location: PACU  Anesthesia Type: General  Level of Consciousness: awake, alert  and patient cooperative  Airway and Oxygen Therapy: Patient Spontanous Breathing and Patient connected to supplemental oxygen  Post-op Assessment: Post-op Vital signs reviewed, Patient's Cardiovascular Status Stable, Respiratory Function Stable, Patent Airway and No signs of Nausea or vomiting  Post-op Vital Signs: Reviewed and stable  Complications: No apparent anesthesia complications

## 2015-03-29 ENCOUNTER — Encounter: Payer: Self-pay | Admitting: Otolaryngology

## 2015-03-30 LAB — SURGICAL PATHOLOGY

## 2015-04-05 ENCOUNTER — Ambulatory Visit (INDEPENDENT_AMBULATORY_CARE_PROVIDER_SITE_OTHER): Payer: BLUE CROSS/BLUE SHIELD | Admitting: Psychiatry

## 2015-04-05 ENCOUNTER — Encounter: Payer: Self-pay | Admitting: Psychiatry

## 2015-04-05 DIAGNOSIS — F411 Generalized anxiety disorder: Secondary | ICD-10-CM | POA: Diagnosis not present

## 2015-04-05 DIAGNOSIS — F332 Major depressive disorder, recurrent severe without psychotic features: Secondary | ICD-10-CM

## 2015-04-05 DIAGNOSIS — F5081 Binge eating disorder: Secondary | ICD-10-CM | POA: Diagnosis not present

## 2015-04-05 MED ORDER — LISDEXAMFETAMINE DIMESYLATE 70 MG PO CAPS: 70.0000 mg | ORAL_CAPSULE | ORAL | Status: DC

## 2015-04-05 NOTE — Progress Notes (Signed)
[facility] MD/PA/NP OP Progress Note  04/05/2015 9:29 AM [name]  MRN:  [id number]  Subjective:  Patient returns for follow-up of her binge eating disorder, generalized anxiety disorder and major depression. She presented to the point with her husband today. She states they got some bad news last night. She states they were informed that her mother is not curable. She indicates since then she has felt depressed, overwhelmed and found it difficult to do things. She states she did not even want to get out of bed this morning. I asked patient and husband how things had been before she got this news and patient said they have been going okay. At the last visit we may changes to increase her Klonopin to 1 mg twice a day and 2 mg at bedtime. In addition we increased her Cymbalta 60 mg twice daily. Patient seen report these changes helped her mood and her anxiety however she states she's noticed she is had balance problems. She states it's typically when she first rises from sitting to standing. I reviewed with her the dosing instructions and she indicated she was taking a whole tablet in the morning which actually is 2 mg. The instructions did read a half a tablet in the morning a half a tablet in the afternoon and a whole tablet at bedtime. I discussed with the patient that she might not need the whole tablet at bedtime if she is having balance problems. However she stated that the 2 mg at bedtime was helping her with her dreams and insomnia.  I did review with patient and husband that she might be starting to grieve the loss of her mother and discussed the different stages. Patient indicated she felt she was in the angry stage. I did state that issues would indicate unhealthy grieving would be something like suicidal ideation. Patient catered this is not happened since prior to her hospitalization earlier this month.  I reviewed with her if she's made any progress on obtaining a therapist. She states with all the  things going on with her mother she has not had a chance to do this.   Chief Complaint: Mother has lymphoma  Visit Diagnosis:     ICD-9-CM ICD-10-CM   1. Major depressive disorder, recurrent, severe without psychotic features ([place]) 296.33 F33.2   2. Binge eating disorder 307.50 F50.81   3. GAD (generalized anxiety disorder) 300.02 F41.1     Past Medical History:  Past Medical History  Diagnosis Date  . Tobacco use   . Breast screening   . Lump or mass in breast     left  . Obesity, unspecified   . Back pain   . Screening for obesity   . Breast abscess 1993    right  . Depression   . ADD (attention deficit disorder)   . Headache     sinus  . Asthma   . Hypertension     diet controlled  . PONV (postoperative nausea and vomiting)     Past Surgical History  Procedure Laterality Date  . Abdominal hysterectomy  2001  . Cholecystectomy  1986  . Cesarean section  1986, 1991  . Breast biopsy Left 2013  . Breast surgery Left 2014    excision of fibroadenoma.  . Image guided sinus surgery N/A 03/28/2015    Procedure: IMAGE GUIDED SINUS SURGERY;  Surgeon: Clyde Canterbury, MD;  Location: [facility];  Service: ENT;  Laterality: N/A;  STRYKER gave disk to cece  . Maxillary  antrostomy Right 03/28/2015    Procedure: MAXILLARY ANTROSTOMY;  Surgeon: Clyde Canterbury, MD;  Location: [facility];  Service: ENT;  Laterality: Right;  . Ethmoidectomy Right 03/28/2015    Procedure: RIGHT ANTERIOR ETHMOIDECTOMY;  Surgeon: Clyde Canterbury, MD;  Location: [facility];  Service: ENT;  Laterality: Right;   Family History:  Family History  Problem Relation Age of Onset  . Other Other 65    Colon Cancer, Skin Cancer  . Other Maternal Uncle 70    Prostate Cancer  . Diabetes Mother   . Heart disease Mother   . Parkinson's disease Father   . Colon cancer Father   . Hypotension Father   . Alcohol abuse Father   . Seizures Sister   . Depression Sister   . Depression Sister     Social History:  Social History   Social History  . Marital Status: Married    Spouse Name: N/A  . Number of Children: N/A  . Years of Education: N/A   Social History Main Topics  . Smoking status: Former Smoker -- 1.00 packs/day    Types: Cigarettes    Quit date: 09/27/1994  . Smokeless tobacco: Never Used  . Alcohol Use: 0.0 oz/week    0 Glasses of wine, 0 Cans of beer, 0 Shots of liquor, 0 Standard drinks or equivalent per week     Comment: 1x/6 mos  . Drug Use: No  . Sexual Activity: Yes    Birth Control/ Protection: None   Other Topics Concern  . Not on file   Social History Narrative   Additional History:   Assessment:   Musculoskeletal: Strength & Muscle Tone: within normal limits Gait & Station: normal Patient leans: N/A  Psychiatric Specialty Exam: Anxiety Symptoms include nervous/anxious behavior. Patient reports no insomnia or suicidal ideas.      Review of Systems  Psychiatric/Behavioral: Positive for depression (secondary to being informal last night that her mother's health is not curable). Negative for suicidal ideas, hallucinations, memory loss and substance abuse. The patient is nervous/anxious. The patient does not have insomnia.        Continued stress related to caring for mother  All other systems reviewed and are negative.   There were no vitals taken for this visit.There is no weight on file to calculate BMI.  General Appearance: Well Groomed  Eye Contact:  Good  Speech:  Normal Rate  Volume:  Normal  Mood:  Okay  Affect:  Restricted but somewhat able to smile at times about work in this writer's departure   Thought Process:  Linear and Logical  Orientation:  Full (Time, Place, and Person)  Thought Content:  Negative  Suicidal Thoughts:  No  Homicidal Thoughts:  No  Memory:  Immediate;   Good Recent;   Good Remote;   Good  Judgement:  Good  Insight:  Good  Psychomotor Activity:  Negative  Concentration:  Good  Recall:  Good   Fund of Knowledge: Good  Language: Good  Akathisia:  Negative  Handed:  Right unknown   AIMS (if indicated):  Done today  Assets:  Communication Skills Desire for Improvement Social Support Vocational/Educational  ADL's:  Intact  Cognition: WNL  Sleep:  good   Is the patient at risk to self?  No. Has the patient been a risk to self in the past 6 months?  No. Has the patient been a risk to self within the distant past?  No. Is the patient a risk to others?  No. Has the patient been a risk to others in the past 6 months?  No. Has the patient been a risk to others within the distant past?  No.  Current Medications: Current Outpatient Prescriptions  Medication Sig Dispense Refill  . ARIPiprazole (ABILIFY) 5 MG tablet Take 1.5 tablets (7.5 mg total) by mouth every morning. (Patient taking differently: Take 5 mg by mouth daily. PM) 30 tablet 4  . clindamycin (CLEOCIN) 300 MG capsule Take 300 mg by mouth 4 (four) times daily.    . clonazePAM (KLONOPIN) 2 MG tablet One half a tablet in the morning , one half a tablet in the afternoon, and one whole tablet at bedtime 60 tablet 4  . DULoxetine (CYMBALTA) 60 MG capsule Take 1 capsule (60 mg total) by mouth 2 (two) times daily. 60 capsule 4  . Ipratropium-Albuterol (COMBIVENT RESPIMAT) 20-100 MCG/ACT AERS respimat Inhale 1 puff into the lungs every 6 (six) hours as needed for wheezing.    Marland Kitchen lisdexamfetamine (VYVANSE) 70 MG capsule Take 1 capsule (70 mg total) by mouth every morning. 30 capsule 0  . oxyCODONE-acetaminophen (PERCOCET/ROXICET) 5-325 MG tablet Take 1-2 tablets by mouth every 4 (four) hours as needed for severe pain.     Marland Kitchen oxyCODONE-acetaminophen (PERCOCET/ROXICET) 5-325 MG tablet Take 1-2 tablets by mouth every 4 (four) hours as needed for moderate pain or severe pain. 20 tablet 0   No current facility-administered medications for this visit.    Medical Decision Making:  Established Problem, Stable/Improving (1), Review of  Medication Regimen & Side Effects (2) and Review of New Medication or Change in Dosage (2)  Treatment Plan Summary:Medication management and Plan   Major  depressive disorder-  We increased the patient's duloxetine from 60 mg at bedtime to 30 mg in the morning and 60 mg at bedtime to 60 mg twice daily.  Generalized anxiety disorder-we will increase her Klonopin to 1 mg twice daily and 2 mg at bedtime.  Adjustment disorder with anxiety-lorazepam as above  Binge eating disorder-continue Vyvanse 70 mg daily.  I given her prescription to fill in the middle of February as well as another one to fill in March and thus she should have enough of this medication to last her through the middle of April.  Patient follow up in 1 month. He is aware her next follow-up will be with the new psychiatrist. She's been encouraged call any questions concerns prior to her next appointment.   Faith Rogue 04/05/2015, 9:29 AM

## 2015-04-17 ENCOUNTER — Encounter: Payer: Self-pay | Admitting: Psychiatry

## 2015-04-17 ENCOUNTER — Ambulatory Visit (INDEPENDENT_AMBULATORY_CARE_PROVIDER_SITE_OTHER): Payer: No Typology Code available for payment source | Admitting: Psychiatry

## 2015-04-17 DIAGNOSIS — F32A Depression, unspecified: Secondary | ICD-10-CM | POA: Insufficient documentation

## 2015-04-17 DIAGNOSIS — F329 Major depressive disorder, single episode, unspecified: Secondary | ICD-10-CM

## 2015-04-17 MED ORDER — RISPERIDONE 0.25 MG PO TABS: 0.2500 mg | ORAL_TABLET | Freq: Two times a day (BID) | ORAL | Status: DC

## 2015-04-17 NOTE — Progress Notes (Signed)
[facility] MD/PA/NP OP Progress Note  04/17/2015 3:03 PM [name]  MRN:  [id number]  Subjective:  [name] is a 50 year old Caucasian female with a long history of major depressive disorder, bulimia and ADD. Patient today was seen along with her husband. Patient was previously seen by Dr. Jimmye Norman and most recently was being treated for severe depression secondary to her mom being diagnosed with a terminal illness. Patient reports that her Cymbalta was increased to 60 mg twice daily and states it was not working for her. States that she had some severe difficulties with remembering words and feeling like her balance was off. Patient has been crying frequently and was quite tearful in the office as well. sHe reports going to bed at about 7 PM and falling asleep at about 11 or 12 PM. sHe has been taking the Klonopin 2 mg in the morning and 2 mg at bedtime. She has stopped taking the Vyvanse. Endorsing depressed mood, frequent crying, anhedonia, fatigue and hopelessness. She denies suicidal ideations. Her husband reports that he is not worried about her safety. Patient reports that her mom has been put in hospice and they have given her about 1-2 weeks. sHe reports that she has been having nightmares over the past week to 10 days. States that she is been very close to her mother.   Chief Complaint:  Chief Complaint    Stress; Follow-up; Medication Refill; Medication Problem     Visit Diagnosis:  No diagnosis found.  Past Medical History:  Past Medical History  Diagnosis Date  . Tobacco use   . Breast screening   . Lump or mass in breast     left  . Obesity, unspecified   . Back pain   . Screening for obesity   . Breast abscess 1993    right  . Depression   . ADD (attention deficit disorder)   . Headache     sinus  . Asthma   . Hypertension     diet controlled  . PONV (postoperative nausea and vomiting)     Past Surgical History  Procedure Laterality Date  . Abdominal hysterectomy   2001  . Cholecystectomy  1986  . Cesarean section  1986, 1991  . Breast biopsy Left 2013  . Breast surgery Left 2014    excision of fibroadenoma.  . Image guided sinus surgery N/A 03/28/2015    Procedure: IMAGE GUIDED SINUS SURGERY;  Surgeon: Clyde Canterbury, MD;  Location: [facility];  Service: ENT;  Laterality: N/A;  STRYKER gave disk to cece  . Maxillary antrostomy Right 03/28/2015    Procedure: MAXILLARY ANTROSTOMY;  Surgeon: Clyde Canterbury, MD;  Location: [facility];  Service: ENT;  Laterality: Right;  . Ethmoidectomy Right 03/28/2015    Procedure: RIGHT ANTERIOR ETHMOIDECTOMY;  Surgeon: Clyde Canterbury, MD;  Location: [facility];  Service: ENT;  Laterality: Right;   Family History:  Family History  Problem Relation Age of Onset  . Other Other 65    Colon Cancer, Skin Cancer  . Other Maternal Uncle 70    Prostate Cancer  . Diabetes Mother   . Heart disease Mother   . Parkinson's disease Father   . Colon cancer Father   . Hypotension Father   . Alcohol abuse Father   . Seizures Sister   . Depression Sister   . Depression Sister    Social History:  Social History   Social History  . Marital Status: Married    Spouse Name:  N/A  . Number of Children: N/A  . Years of Education: N/A   Social History Main Topics  . Smoking status: Former Smoker -- 1.00 packs/day    Types: Cigarettes    Quit date: 09/27/1994  . Smokeless tobacco: Never Used  . Alcohol Use: 0.0 oz/week    0 Glasses of wine, 0 Cans of beer, 0 Shots of liquor, 0 Standard drinks or equivalent per week     Comment: 1x/6 mos  . Drug Use: No  . Sexual Activity: Yes    Birth Control/ Protection: None   Other Topics Concern  . None   Social History Narrative   Additional History: History of major depressive disorder for 30 years  Assessment:   Musculoskeletal: Strength & Muscle Tone: within normal limits Gait & Station: normal Patient leans: N/A  Psychiatric Specialty Exam: HPI   ROS  Blood pressure 142/100, pulse 105, temperature 98.6 F (37 C), temperature source Tympanic, height 5\' 6"  (1.676 m), weight 268 lb 12.8 oz (121.927 kg), SpO2 93 %.Body mass index is 43.41 kg/(m^2).  General Appearance: Disheveled  Eye Contact:  Fair  Speech:  Clear and Coherent  Volume:  Decreased  Mood:  Anxious, Depressed and Dysphoric  Affect:  Labile and Tearful  Thought Process:  Circumstantial  Orientation:  Full (Time, Place, and Person)  Thought Content:  Rumination  Suicidal Thoughts:  No  Homicidal Thoughts:  No  Memory:  Immediate;   Fair Recent;   Fair Remote;   Fair  Judgement:  Fair  Insight:  Present  Psychomotor Activity:  Decreased  Concentration:  Poor  Recall:  [employer] of Knowledge: Fair  Language: Fair  Akathisia:  No  Handed:  Right  AIMS (if indicated):    Assets:  Communication Skills Desire for Improvement Housing Social Support  ADL's:  Intact  Cognition: WNL  Sleep:  okay   Is the patient at risk to self?  No. Has the patient been a risk to self in the past 6 months?  Yes.   Has the patient been a risk to self within the distant past?  Yes.   Is the patient a risk to others?  No. Has the patient been a risk to others in the past 6 months?  No. Has the patient been a risk to others within the distant past?  No.  Current Medications: Current Outpatient Prescriptions  Medication Sig Dispense Refill  . clonazePAM (KLONOPIN) 2 MG tablet One half a tablet in the morning , one half a tablet in the afternoon, and one whole tablet at bedtime 60 tablet 4  . lisdexamfetamine (VYVANSE) 70 MG capsule Take 1 capsule (70 mg total) by mouth every morning. 30 capsule 0   No current facility-administered medications for this visit.    Medical Decision Making:  Established Problem, Worsening (2), Review of Last Therapy Session (1), Review of Medication Regimen & Side Effects (2) and Review of New Medication or Change in Dosage (2)  Treatment Plan  Summary:Medication management  Major depressive disorder: Discussed with patient decreasing the Klonopin to 1 mg twice daily. She is agreeable Start Risperdal at 0.25 mg twice daily  Anxiety: Above medications Start therapy as soon as possible to help her process her grief at impending mom's death  Bulimia: Will address at a later date once mood is stabilized  Return to clinic in 1 week's time on call before if needed   [name] 04/17/2015, 3:03 PM

## 2015-04-19 ENCOUNTER — Ambulatory Visit: Payer: No Typology Code available for payment source | Admitting: Psychiatry

## 2015-04-19 ENCOUNTER — Telehealth (HOSPITAL_COMMUNITY): Payer: Self-pay | Admitting: Psychiatry

## 2015-04-19 ENCOUNTER — Telehealth: Payer: Self-pay | Admitting: Psychiatry

## 2015-04-19 NOTE — Telephone Encounter (Signed)
per dr. Einar Grad, call in prozac 20mg  take one daily #30 , rx was called into pharmacy.

## 2015-04-19 NOTE — Telephone Encounter (Signed)
pt called states that she though dr. Einar Grad was going to send in medications for her prozac and trazodone 100mg .  pt was told that Dr. Einar Grad told me to call in the prozac but nothing about trazodone.  pt was told that dr. Einar Grad had already left for the day and would be back tomorrow.  That I would send her a message .  Pt states that she had one trazodone left so she was going to take that one.

## 2015-04-20 ENCOUNTER — Ambulatory Visit: Payer: No Typology Code available for payment source | Admitting: Psychiatry

## 2015-04-20 NOTE — Telephone Encounter (Signed)
Patient had called about her anxiety and difficulty sleeping. She reported that the Risperdal was helping her somewhat with her anxiety but she continues to have difficulty sleeping. She did start the Prozac at 20 mg last night. She is also taking the Klonopin twice daily. Patient also reported that her mom passed away last night. Patient doing okay, denies having any suicidal thoughts. Discussed with patient that we will review her medications at her next visit. She is agreeable to this plan.

## 2015-04-21 ENCOUNTER — Ambulatory Visit: Payer: BLUE CROSS/BLUE SHIELD | Admitting: Psychiatry

## 2015-04-25 ENCOUNTER — Ambulatory Visit: Payer: No Typology Code available for payment source | Admitting: Psychiatry

## 2015-05-03 ENCOUNTER — Ambulatory Visit: Payer: No Typology Code available for payment source | Admitting: Psychiatry

## 2015-09-21 ENCOUNTER — Ambulatory Visit
Admission: EM | Admit: 2015-09-21 | Discharge: 2015-09-21 | Disposition: A | Discharge status: Home / Self Care | Payer: BLUE CROSS/BLUE SHIELD | Attending: Family Medicine | Admitting: Family Medicine

## 2015-09-21 ENCOUNTER — Ambulatory Visit (INDEPENDENT_AMBULATORY_CARE_PROVIDER_SITE_OTHER): Payer: BLUE CROSS/BLUE SHIELD

## 2015-09-21 DIAGNOSIS — M79672 Pain in left foot: Secondary | ICD-10-CM

## 2015-09-21 DIAGNOSIS — S96912A Strain of unspecified muscle and tendon at ankle and foot level, left foot, initial encounter: Secondary | ICD-10-CM

## 2015-09-21 MED ORDER — OXYCODONE-ACETAMINOPHEN 5-325 MG PO TABS: 1.0000 | ORAL_TABLET | Freq: Three times a day (TID) | ORAL | Status: DC | PRN

## 2015-09-21 MED ORDER — MELOXICAM 15 MG PO TABS: 15.0000 mg | ORAL_TABLET | Freq: Every day | ORAL | Status: DC

## 2015-09-21 NOTE — ED Provider Notes (Signed)
CSN: [id number]     Arrival date & time 09/21/15  1714 History   First MD Initiated Contact with Patient 09/21/15 1850    Nurses notes were reviewed. Chief Complaint  Patient presents with  . Foot Pain    Patient with left foot pain. She states that she was sitting with her left foot underneath her body and when she got up she did not realize left foot was asleep. Left foot subsequently became inverted underneath her leg and she heard a popping sound. She is not sure whether this happened on Monday night or Tuesday night but since then the left foot has been swollen and bruised and very tender to palpation. She states the pain is moving up her foot.  Past medical history she's had a history of depression, she states that the only problem that she has but according to records she's had a history of ADD asthma hypertension obesity as well. She is allergic to Haldol, cyclosporine, prednisone and doxycycline. She smoked previously but does not smoke now.   (Consider location/radiation/quality/duration/timing/severity/associated sxs/prior Treatment) Patient is a 50 y.o. female presenting with lower extremity pain. The history is provided by the patient. No language interpreter was used.  Foot Pain This is a new problem. The current episode started more than 2 days ago. The problem has been gradually worsening. Pertinent negatives include no chest pain, no abdominal pain, no headaches and no shortness of breath. The symptoms are aggravated by walking. Nothing relieves the symptoms. She has tried acetaminophen for the symptoms. The treatment provided no relief.    Past Medical History  Diagnosis Date  . Tobacco use   . Breast screening   . Lump or mass in breast     left  . Obesity, unspecified   . Back pain   . Screening for obesity   . Breast abscess 1993    right  . Depression   . ADD (attention deficit disorder)   . Headache     sinus  . Asthma   . Hypertension     diet controlled  .  PONV (postoperative nausea and vomiting)    Past Surgical History  Procedure Laterality Date  . Abdominal hysterectomy  2001  . Cholecystectomy  1986  . Cesarean section  1986, 1991  . Breast biopsy Left 2013  . Breast surgery Left 2014    excision of fibroadenoma.  . Image guided sinus surgery N/A 03/28/2015    Procedure: IMAGE GUIDED SINUS SURGERY;  Surgeon: Clyde Canterbury, MD;  Location: [facility];  Service: ENT;  Laterality: N/A;  STRYKER gave disk to cece  . Maxillary antrostomy Right 03/28/2015    Procedure: MAXILLARY ANTROSTOMY;  Surgeon: Clyde Canterbury, MD;  Location: [facility];  Service: ENT;  Laterality: Right;  . Ethmoidectomy Right 03/28/2015    Procedure: RIGHT ANTERIOR ETHMOIDECTOMY;  Surgeon: Clyde Canterbury, MD;  Location: [facility];  Service: ENT;  Laterality: Right;   Family History  Problem Relation Age of Onset  . Other Other 65    Colon Cancer, Skin Cancer  . Other Maternal Uncle 70    Prostate Cancer  . Diabetes Mother   . Heart disease Mother   . Parkinson's disease Father   . Colon cancer Father   . Hypotension Father   . Alcohol abuse Father   . Seizures Sister   . Depression Sister   . Depression Sister    Social History  Substance Use Topics  . Smoking status: Former Smoker --  1.00 packs/day    Types: Cigarettes    Quit date: 09/27/1994  . Smokeless tobacco: Never Used  . Alcohol Use: No     Comment: 1x/6 mos   OB History    Gravida Para Term Preterm AB TAB SAB Ectopic Multiple Living   2 2        2       Obstetric Comments   Menstrual age: 20  Age 1st Pregnancy: 33     Review of Systems  Respiratory: Negative for shortness of breath.   Cardiovascular: Negative for chest pain.  Gastrointestinal: Negative for abdominal pain.  Neurological: Negative for headaches.  All other systems reviewed and are negative.   Allergies  Cyclosporine; Haldol; Prednisone; and Tetracycline  Home Medications   Prior to  Admission medications   Medication Sig Start Date End Date Taking? Authorizing Provider  clonazePAM (KLONOPIN) 2 MG tablet One half a tablet in the morning , one half a tablet in the afternoon, and one whole tablet at bedtime 03/21/15   Marjie Skiff, MD  lisdexamfetamine (VYVANSE) 70 MG capsule Take 1 capsule (70 mg total) by mouth every morning. 04/05/15   Marjie Skiff, MD  meloxicam (MOBIC) 15 MG tablet Take 1 tablet (15 mg total) by mouth daily. 09/21/15   Frederich Cha, MD  oxyCODONE-acetaminophen (PERCOCET/ROXICET) 5-325 MG tablet Take 1 tablet by mouth every 8 (eight) hours as needed for moderate pain or severe pain. 09/21/15   Frederich Cha, MD  risperiDONE (RISPERDAL) 0.25 MG tablet Take 1 tablet (0.25 mg total) by mouth 2 (two) times daily. 04/17/15 04/16/16  Himabindu Ravi, MD   Meds Ordered and Administered this Visit  Medications - No data to display  BP 166/114 mmHg  Pulse 91  Temp(Src) 98.1 F (36.7 C) (Oral)  Resp 17  Ht 5\' 6"  (1.676 m)  Wt 280 lb (127.007 kg)  BMI 45.21 kg/m2  SpO2 100% No data found.   Physical Exam  Constitutional: She is oriented to person, place, and time. She appears well-developed and well-nourished.  HENT:  Head: Normocephalic and atraumatic.  Eyes: Pupils are equal, round, and reactive to light.  Neck: Normal range of motion.  Cardiovascular: Normal rate.   Musculoskeletal: She exhibits edema and tenderness.       Left ankle: She exhibits decreased range of motion and swelling. Tenderness.       Feet:  Neurological: She is alert and oriented to person, place, and time.  Skin: Skin is warm.  Psychiatric: She has a normal mood and affect.  Vitals reviewed.   ED Course  Procedures (including critical care time)  Labs Review Labs Reviewed - No data to display  Imaging Review Dg Foot Complete Left  09/21/2015  CLINICAL DATA:  Twisting injury 2 days ago. Pain and swelling over the dorsum and lateral aspect of the foot. EXAM: LEFT FOOT -  COMPLETE 3+ VIEW COMPARISON:  None. FINDINGS: Mild degenerative change of the first MTP joint. Note soft tissue swelling over the lateral foot. No evidence of acute fracture or dislocation. Small inferior calcaneal spur is present. Minimal degenerative change over the talonavicular joint. IMPRESSION: No acute findings. Minimal degenerative changes as described. Electronically Signed   By: Marin Olp M.D.   On: 09/21/2015 19:00     Visual Acuity Review  Right Eye Distance:   Left Eye Distance:   Bilateral Distance:    Right Eye Near:   Left Eye Near:    Bilateral Near:  MDM   1. Strain of foot, left, initial encounter   2. Foot pain, left     We'll try patient on Cam Walker see if it helps alleviate some of the pain and discomfort. Recommend using a cane. Would avoid crutches. Place on Mobic 1 tablet daily and Percocet 5-25 she can take a half a tablet 1 tablet oral when necessary basis every 8 hours when necessary for pain. Follow-up PCP 1-2 weeks as needed.    Note: This dictation was prepared with Dragon dictation along with smaller phrase technology. Any transcriptional errors that result from this process are unintentional.   Frederich Cha, MD 09/21/15 1943

## 2015-09-21 NOTE — ED Notes (Addendum)
Pt states she went to get up Tuesday night and her foot was asleep causing her foot to roll in medially , pt c/o foot pain with swelling and ecchymosis.Marland Kitchen

## 2015-09-21 NOTE — Discharge Instructions (Signed)
Cryotherapy Cryotherapy is when you put ice on your injury. Ice helps lessen pain and puffiness (swelling) after an injury. Ice works the best when you start using it in the first 24 to 48 hours after an injury. HOME CARE  Put a dry or damp towel between the ice pack and your skin.  You may press gently on the ice pack.  Leave the ice on for no more than 10 to 20 minutes at a time.  Check your skin after 5 minutes to make sure your skin is okay.  Rest at least 20 minutes between ice pack uses.  Stop using ice when your skin loses feeling (numbness).  Do not use ice on someone who cannot tell you when it hurts. This includes small children and people with memory problems (dementia). GET HELP RIGHT AWAY IF:  You have white spots on your skin.  Your skin turns blue or pale.  Your skin feels waxy or hard.  Your puffiness gets worse. MAKE SURE YOU:   Understand these instructions.  Will watch your condition.  Will get help right away if you are not doing well or get worse.   This information is not intended to replace advice given to you by your health care provider. Make sure you discuss any questions you have with your health care provider.   Document Released: 08/07/2007 Document Revised: 05/13/2011 Document Reviewed: 10/11/2010 Elsevier Interactive Patient Education 2016 [place].   Muscle Strain A muscle strain (pulled muscle) happens when a muscle is stretched beyond normal length. It happens when a sudden, violent force stretches your muscle too far. Usually, a few of the fibers in your muscle are torn. Muscle strain is common in athletes. Recovery usually takes 1-2 weeks. Complete healing takes 5-6 weeks.  HOME CARE   Follow the PRICE method of treatment to help your injury get better. Do this the first 2-3 days after the injury:  Protect. Protect the muscle to keep it from getting injured again.  Rest. Limit your activity and rest the injured body  part.  Ice. Put ice in a plastic bag. Place a towel between your skin and the bag. Then, apply the ice and leave it on from 15-20 minutes each hour. After the third day, switch to moist heat packs.  Compression. Use a splint or elastic bandage on the injured area for comfort. Do not put it on too tightly.  Elevate. Keep the injured body part above the level of your heart.  Only take medicine as told by your doctor.  Warm up before doing exercise to prevent future muscle strains. GET HELP IF:   You have more pain or puffiness (swelling) in the injured area.  You feel numbness, tingling, or notice a loss of strength in the injured area. MAKE SURE YOU:   Understand these instructions.  Will watch your condition.  Will get help right away if you are not doing well or get worse.   This information is not intended to replace advice given to you by your health care provider. Make sure you discuss any questions you have with your health care provider.   Document Released: 11/28/2007 Document Revised: 12/09/2012 Document Reviewed: 09/17/2012 Elsevier Interactive Patient Education [employer].

## 2015-11-01 ENCOUNTER — Ambulatory Visit (INDEPENDENT_AMBULATORY_CARE_PROVIDER_SITE_OTHER): Payer: BLUE CROSS/BLUE SHIELD | Admitting: Psychiatry

## 2015-11-01 ENCOUNTER — Encounter: Payer: Self-pay | Admitting: Psychiatry

## 2015-11-01 VITALS — BP 160/108 | HR 96 | Temp 98.9°F | Ht 66.0 in | Wt 280.6 lb

## 2015-11-01 DIAGNOSIS — F331 Major depressive disorder, recurrent, moderate: Secondary | ICD-10-CM

## 2015-11-01 DIAGNOSIS — F5081 Binge eating disorder: Secondary | ICD-10-CM | POA: Diagnosis not present

## 2015-11-01 MED ORDER — PREGABALIN 50 MG PO CAPS: 50.0000 mg | ORAL_CAPSULE | Freq: Two times a day (BID) | ORAL | 1 refills | Status: DC

## 2015-11-01 MED ORDER — BREXPIPRAZOLE 0.5 MG PO TABS: 0.5000 mg | ORAL_TABLET | Freq: Every day | ORAL | 1 refills | Status: DC

## 2015-11-01 MED ORDER — FLUOXETINE HCL 20 MG PO CAPS: 20.0000 mg | ORAL_CAPSULE | Freq: Every day | ORAL | 11 refills | Status: DC

## 2015-11-01 NOTE — Progress Notes (Signed)
BH MD/PA/NP OP Progress Note  11/01/2015 12:30 PM [name]  MRN:  [id number]  Subjective:  [name] is a 50 year old Caucasian female with a long history of major depressive disorder, and bereavement presented for the follow-up. She was last seen by Dr. Einar Grad  in February. She reported that her mother passed away in family and she stopped taking all her medications at that time. She was going to the withdrawals of Cymbalta and Vyvanse. She was not sure what happened to her as she was very much depressed due to the death of her mother. She went through a lot and reported that she was also going to withdrawal symptoms as well as dealing with the death of her mother. She became worse. Recently her primary care physician has started her on Prozac and now she is taking 40 mg of the medication. She reported that she is feeling depressed and has lack of emotions. She wants to have her medications adjusted. She spends most of the time sitting. Patient reported that she wants to have her medications adjusted at this time. She reported that she has long history of fibromyalgia and she is not taking any medication at this time. She has taken Lyrica in the past which has really helped her. She is interesting in adjusting her medications at this time.  r.   Chief Complaint:  Chief Complaint    Follow-up; Medication Refill     Visit Diagnosis:     ICD-9-CM ICD-10-CM   1. MDD (major depressive disorder), recurrent episode, moderate (HCC) 296.32 F33.1   2. Binge eating disorder 307.50 F50.81     Past Medical History:  Past Medical History:  Diagnosis Date  . ADD (attention deficit disorder)   . Asthma   . Back pain   . Breast abscess 1993   right  . Breast screening   . Depression   . Headache    sinus  . Hypertension    diet controlled  . Lump or mass in breast    left  . Obesity, unspecified   . PONV (postoperative nausea and vomiting)   . Screening for obesity   . Tobacco use      Past Surgical History:  Procedure Laterality Date  . ABDOMINAL HYSTERECTOMY  2001  . BREAST BIOPSY Left 2013  . BREAST SURGERY Left 2014   excision of fibroadenoma.  . [place]  . CHOLECYSTECTOMY  1986  . ETHMOIDECTOMY Right 03/28/2015   Procedure: RIGHT ANTERIOR ETHMOIDECTOMY;  Surgeon: Clyde Canterbury, MD;  Location: [facility];  Service: ENT;  Laterality: Right;  . IMAGE GUIDED SINUS SURGERY N/A 03/28/2015   Procedure: IMAGE GUIDED SINUS SURGERY;  Surgeon: Clyde Canterbury, MD;  Location: [facility];  Service: ENT;  Laterality: N/A;  STRYKER gave disk to cece  . MAXILLARY ANTROSTOMY Right 03/28/2015   Procedure: MAXILLARY ANTROSTOMY;  Surgeon: Clyde Canterbury, MD;  Location: [facility];  Service: ENT;  Laterality: Right;   Family History:  Family History  Problem Relation Age of Onset  . Other Other 65    Colon Cancer, Skin Cancer  . Other Maternal Uncle 70    Prostate Cancer  . Diabetes Mother   . Heart disease Mother   . Parkinson's disease Father   . Colon cancer Father   . Hypotension Father   . Alcohol abuse Father   . Seizures Sister   . Depression Sister   . Depression Sister    Social History:  Social History   Social History  . Marital status: Married    Spouse name: N/A  . Number of children: N/A  . Years of education: N/A   Social History Main Topics  . Smoking status: Former Smoker    Packs/day: 1.00    Types: Cigarettes    Quit date: 09/27/1994  . Smokeless tobacco: Never Used  . Alcohol use No     Comment: 1x/6 mos  . Drug use: No  . Sexual activity: Yes    Birth control/ protection: None   Other Topics Concern  . None   Social History Narrative  . None   Additional History: History of major depressive disorder for 30 years  Assessment:   Musculoskeletal: Strength & Muscle Tone: within normal limits Gait & Station: normal Patient leans: N/A  Psychiatric Specialty Exam: HPI  ROS  Blood pressure  (!) 160/108, pulse 96, temperature 98.9 F (37.2 C), temperature source Oral, height 5\' 6"  (1.676 m), weight 280 lb 9.6 oz (127.3 kg).Body mass index is 45.29 kg/m.  General Appearance: Disheveled  Eye Contact:  Fair  Speech:  Clear and Coherent  Volume:  Normal  Mood:  Anxious  Affect:  Appropriate and Tearful  Thought Process:  Coherent  Orientation:  Full (Time, Place, and Person)  Thought Content:  WDL  Suicidal Thoughts:  No  Homicidal Thoughts:  No  Memory:  Immediate;   Fair Recent;   Fair Remote;   Fair  Judgement:  Fair  Insight:  Present  Psychomotor Activity:  Normal  Concentration:  Poor  Recall:  [employer] of Knowledge: Fair  Language: Fair  Akathisia:  No  Handed:  Right  AIMS (if indicated):    Assets:  Communication Skills Desire for Improvement Housing Social Support  ADL's:  Intact  Cognition: WNL  Sleep:  okay   Is the patient at risk to self?  No. Has the patient been a risk to self in the past 6 months?  Yes.   Has the patient been a risk to self within the distant past?  Yes.   Is the patient a risk to others?  No. Has the patient been a risk to others in the past 6 months?  No. Has the patient been a risk to others within the distant past?  No.  Current Medications: Current Outpatient Prescriptions  Medication Sig Dispense Refill  . FLUoxetine (PROZAC) 20 MG capsule Take 1 capsule (20 mg total) by mouth daily. Pt has supply 30 capsule 11  . Brexpiprazole (REXULTI) 0.5 MG TABS Take 0.5 mg by mouth daily after breakfast. 30 tablet 1  . pregabalin (LYRICA) 50 MG capsule Take 1 capsule (50 mg total) by mouth 2 (two) times daily. 60 capsule 1   No current facility-administered medications for this visit.     Medical Decision Making:  Established Problem, Worsening (2), Review of Last Therapy Session (1), Review of Medication Regimen & Side Effects (2) and Review of New Medication or Change in Dosage (2)  Treatment Plan Summary:Medication  management  Major depressive disorder:   Discussed with patient what the medications. She is currently taking Prozac and I will decrease the dose to 20 mg every morning. I will also start her on Rexulti  0.5 mg daily. Patient was given the prescription for the same and she will use a coupon. She was also started on Lyrica 50 mg by mouth twice a day and patient will start taking one pill on a daily basis and  will gradually titrate to twice daily.  Advised her about the side effects of medication and she demonstrated understanding.   More than 50% of the time spent in psychoeducation, counseling and coordination of care.    This note was generated in part or whole with voice recognition software. Voice regonition is usually quite accurate but there are transcription errors that can and very often do occur. I apologize for any typographical errors that were not detected and corrected.  Rainey Pines, MD 11/01/2015, 12:30 PM

## 2015-11-22 ENCOUNTER — Ambulatory Visit (INDEPENDENT_AMBULATORY_CARE_PROVIDER_SITE_OTHER): Payer: BLUE CROSS/BLUE SHIELD | Admitting: Psychiatry

## 2015-11-22 ENCOUNTER — Encounter: Payer: Self-pay | Admitting: Psychiatry

## 2015-11-22 ENCOUNTER — Telehealth: Payer: Self-pay

## 2015-11-22 VITALS — BP 160/110 | HR 98 | Temp 98.3°F | Ht 66.0 in | Wt 287.4 lb

## 2015-11-22 DIAGNOSIS — F411 Generalized anxiety disorder: Secondary | ICD-10-CM

## 2015-11-22 DIAGNOSIS — F3160 Bipolar disorder, current episode mixed, unspecified: Secondary | ICD-10-CM | POA: Diagnosis not present

## 2015-11-22 DIAGNOSIS — F5081 Binge eating disorder: Secondary | ICD-10-CM | POA: Diagnosis not present

## 2015-11-22 MED ORDER — LITHIUM CARBONATE ER 300 MG PO TBCR: 300.0000 mg | EXTENDED_RELEASE_TABLET | Freq: Two times a day (BID) | ORAL | 1 refills | Status: DC

## 2015-11-22 MED ORDER — FLUOXETINE HCL 20 MG PO CAPS: 40.0000 mg | ORAL_CAPSULE | Freq: Two times a day (BID) | ORAL | 3 refills | Status: DC

## 2015-11-22 NOTE — Telephone Encounter (Signed)
requested a 90 day supply for the lithium carbonate

## 2015-11-22 NOTE — Progress Notes (Signed)
BH MD/PA/NP OP Progress Note  11/22/2015 11:51 AM [name]  MRN:  [id number]  Subjective:  [name] is a 50 year old Caucasian female who presented for the follow-up appointment. She reported that she was feeling more agitated and angry on the Rexulti and she has stopped taking the medications. Patient stated that she had a blowout with her primary care physician and she became mad and upset as he was not prescribing pain medications to her son. She reported that now she wants to have her medications adjusted. She has been compliant with her medications and wants to take a mood stabilizer as she thinks that she might be bipolar. She feels upset most of the time. Patient currently denied having any suicidal ideations or plans at this time. We discussed about medications at length and she is agreeable to a trial of lithium at this time.  She was unable to get the Lyrica due to insurance purposes. Unable to Sleep well at night with her mind Racing.    Chief Complaint:  Chief Complaint    Follow-up; Medication Refill     Visit Diagnosis:   No diagnosis found.  Past Medical History:  Past Medical History:  Diagnosis Date  . ADD (attention deficit disorder)   . Asthma   . Back pain   . Breast abscess 1993   right  . Breast screening   . Depression   . Headache    sinus  . Hypertension    diet controlled  . Lump or mass in breast    left  . Obesity, unspecified   . PONV (postoperative nausea and vomiting)   . Screening for obesity   . Tobacco use     Past Surgical History:  Procedure Laterality Date  . ABDOMINAL HYSTERECTOMY  2001  . BREAST BIOPSY Left 2013  . BREAST SURGERY Left 2014   excision of fibroadenoma.  . [place]  . CHOLECYSTECTOMY  1986  . ETHMOIDECTOMY Right 03/28/2015   Procedure: RIGHT ANTERIOR ETHMOIDECTOMY;  Surgeon: Clyde Canterbury, MD;  Location: [facility];  Service: ENT;  Laterality: Right;  . IMAGE GUIDED SINUS SURGERY  N/A 03/28/2015   Procedure: IMAGE GUIDED SINUS SURGERY;  Surgeon: Clyde Canterbury, MD;  Location: [facility];  Service: ENT;  Laterality: N/A;  STRYKER gave disk to cece  . MAXILLARY ANTROSTOMY Right 03/28/2015   Procedure: MAXILLARY ANTROSTOMY;  Surgeon: Clyde Canterbury, MD;  Location: [facility];  Service: ENT;  Laterality: Right;   Family History:  Family History  Problem Relation Age of Onset  . Other Other 65    Colon Cancer, Skin Cancer  . Other Maternal Uncle 70    Prostate Cancer  . Diabetes Mother   . Heart disease Mother   . Parkinson's disease Father   . Colon cancer Father   . Hypotension Father   . Alcohol abuse Father   . Seizures Sister   . Depression Sister   . Depression Sister    Social History:  Social History   Social History  . Marital status: Married    Spouse name: N/A  . Number of children: N/A  . Years of education: N/A   Social History Main Topics  . Smoking status: Former Smoker    Packs/day: 1.00    Types: Cigarettes    Quit date: 09/27/1994  . Smokeless tobacco: Never Used  . Alcohol use No     Comment: 1x/6 mos  . Drug use: No  .  Sexual activity: Yes    Birth control/ protection: None   Other Topics Concern  . None   Social History Narrative  . None   Additional History: History of major depressive disorder for 30 years  Assessment:   Musculoskeletal: Strength & Muscle Tone: within normal limits Gait & Station: normal Patient leans: N/A  Psychiatric Specialty Exam: Medication Refill     ROS  Blood pressure (!) 160/110, pulse 98, temperature 98.3 F (36.8 C), temperature source Oral, height 5\' 6"  (1.676 m), weight 287 lb 6.4 oz (130.4 kg).Body mass index is 46.39 kg/m.  General Appearance: Disheveled  Eye Contact:  Fair  Speech:  Clear and Coherent  Volume:  Normal  Mood:  Anxious  Affect:  Appropriate and Tearful  Thought Process:  Coherent  Orientation:  Full (Time, Place, and Person)  Thought Content:   WDL  Suicidal Thoughts:  No  Homicidal Thoughts:  No  Memory:  Immediate;   Fair Recent;   Fair Remote;   Fair  Judgement:  Fair  Insight:  Present  Psychomotor Activity:  Normal  Concentration:  Poor  Recall:  [employer] of Knowledge: Fair  Language: Fair  Akathisia:  No  Handed:  Right  AIMS (if indicated):    Assets:  Communication Skills Desire for Improvement Housing Social Support  ADL's:  Intact  Cognition: WNL  Sleep:  okay   Is the patient at risk to self?  No. Has the patient been a risk to self in the past 6 months?  Yes.   Has the patient been a risk to self within the distant past?  Yes.   Is the patient a risk to others?  No. Has the patient been a risk to others in the past 6 months?  No. Has the patient been a risk to others within the distant past?  No.  Current Medications: Current Outpatient Prescriptions  Medication Sig Dispense Refill  . FLUoxetine (PROZAC) 20 MG capsule Take 1 capsule (20 mg total) by mouth daily. Pt has supply 30 capsule 11  . pregabalin (LYRICA) 50 MG capsule Take 1 capsule (50 mg total) by mouth 2 (two) times daily. 60 capsule 1  . Brexpiprazole (REXULTI) 0.5 MG TABS Take 0.5 mg by mouth daily after breakfast. (Patient not taking: Reported on 11/22/2015) 30 tablet 1   No current facility-administered medications for this visit.     Medical Decision Making:  Established Problem, Worsening (2), Review of Last Therapy Session (1), Review of Medication Regimen & Side Effects (2) and Review of New Medication or Change in Dosage (2)  Treatment Plan Summary:Medication management  Major depressive disorder:   Discussed with patient what the medications. She is currently taking Prozac 40mg  at bedtime.  D/c  Rexulti   I will start her on lithium carbonate 300 mg for 1 week and then she will titrate the dose to 600 mg. Discussed with her about the side effects of the medications and she demonstrated understanding. Follow-up in 3 weeks or  earlier depending on her symptoms.    Advised her about the side effects of medication and she demonstrated understanding.   More than 50% of the time spent in psychoeducation, counseling and coordination of care.    This note was generated in part or whole with voice recognition software. Voice regonition is usually quite accurate but there are transcription errors that can and very often do occur. I apologize for any typographical errors that were not detected and corrected.  Cinda Quest  Gretel Acre, MD 11/22/2015, 11:51 AM

## 2015-11-22 NOTE — Telephone Encounter (Signed)
called pharmacy oked 90 day supply for lithium carbonate.  pt last seen on  11-01-15 has appt in oct

## 2015-12-15 ENCOUNTER — Ambulatory Visit (INDEPENDENT_AMBULATORY_CARE_PROVIDER_SITE_OTHER): Payer: BLUE CROSS/BLUE SHIELD | Admitting: Psychiatry

## 2015-12-15 ENCOUNTER — Other Ambulatory Visit: Payer: Self-pay | Admitting: Psychiatry

## 2015-12-15 ENCOUNTER — Encounter: Payer: Self-pay | Admitting: Psychiatry

## 2015-12-15 VITALS — BP 137/88 | HR 86 | Temp 98.8°F | Wt 289.0 lb

## 2015-12-15 DIAGNOSIS — F3160 Bipolar disorder, current episode mixed, unspecified: Secondary | ICD-10-CM

## 2015-12-15 DIAGNOSIS — F50819 Binge eating disorder, unspecified: Secondary | ICD-10-CM

## 2015-12-15 DIAGNOSIS — F5081 Binge eating disorder: Secondary | ICD-10-CM | POA: Diagnosis not present

## 2015-12-15 DIAGNOSIS — J45909 Unspecified asthma, uncomplicated: Secondary | ICD-10-CM | POA: Insufficient documentation

## 2015-12-15 MED ORDER — LITHIUM CARBONATE ER 300 MG PO TBCR
300.0000 mg | EXTENDED_RELEASE_TABLET | Freq: Two times a day (BID) | ORAL | Status: DC
Start: 2015-12-15 — End: 2016-01-04

## 2015-12-15 MED ORDER — DULOXETINE HCL 30 MG PO CPEP: 30.0000 mg | ORAL_CAPSULE | Freq: Every day | ORAL | 0 refills | Status: DC

## 2015-12-15 NOTE — Progress Notes (Signed)
BH MD/PA/NP OP Progress Note  12/15/2015 10:34 AM [name]  MRN:  [id number]  Subjective:  [name] is a 50 year old Caucasian female who presented for the follow-up appointment. She reported that she was doing well on the lithium and she started taking the medication. However she started feeling severe body aches and muscle ache after a few days. She reported that she was also feeling thirsty on the lithium. She stopped taking the medication after a few days. She continued to take the Prozac at this time. She reported that she continues to spend most of the time in the couch and in the bed. She is very sad and sedentary  Her husband will leave her in the bed and she will continue to spend most of the time in the same place. Patient reported that the medications are not helping her. She wants to have her medications adjusted. We discussed about other medications as well. She is receptive to have her medications changes at this time.Patient currently denied having any suicidal ideations or plans at this time.      Chief Complaint:  Chief Complaint    Follow-up; Medication Refill     Visit Diagnosis:   No diagnosis found.  Past Medical History:  Past Medical History:  Diagnosis Date  . ADD (attention deficit disorder)   . Asthma   . Back pain   . Breast abscess 1993   right  . Breast screening   . Depression   . Headache    sinus  . Hypertension    diet controlled  . Lump or mass in breast    left  . Obesity, unspecified   . PONV (postoperative nausea and vomiting)   . Screening for obesity   . Tobacco use     Past Surgical History:  Procedure Laterality Date  . ABDOMINAL HYSTERECTOMY  2001  . BREAST BIOPSY Left 2013  . BREAST SURGERY Left 2014   excision of fibroadenoma.  . [place]  . CHOLECYSTECTOMY  1986  . ETHMOIDECTOMY Right 03/28/2015   Procedure: RIGHT ANTERIOR ETHMOIDECTOMY;  Surgeon: Clyde Canterbury, MD;  Location: [facility];   Service: ENT;  Laterality: Right;  . IMAGE GUIDED SINUS SURGERY N/A 03/28/2015   Procedure: IMAGE GUIDED SINUS SURGERY;  Surgeon: Clyde Canterbury, MD;  Location: [facility];  Service: ENT;  Laterality: N/A;  STRYKER gave disk to cece  . MAXILLARY ANTROSTOMY Right 03/28/2015   Procedure: MAXILLARY ANTROSTOMY;  Surgeon: Clyde Canterbury, MD;  Location: [facility];  Service: ENT;  Laterality: Right;   Family History:  Family History  Problem Relation Age of Onset  . Other Other 65    Colon Cancer, Skin Cancer  . Other Maternal Uncle 70    Prostate Cancer  . Diabetes Mother   . Heart disease Mother   . Parkinson's disease Father   . Colon cancer Father   . Hypotension Father   . Alcohol abuse Father   . Seizures Sister   . Depression Sister   . Depression Sister    Social History:  Social History   Social History  . Marital status: Married    Spouse name: N/A  . Number of children: N/A  . Years of education: N/A   Social History Main Topics  . Smoking status: Former Smoker    Packs/day: 1.00    Types: Cigarettes    Quit date: 09/27/1994  . Smokeless tobacco: Never Used  . Alcohol use No  Comment: 1x/6 mos  . Drug use: No  . Sexual activity: Yes    Birth control/ protection: None   Other Topics Concern  . None   Social History Narrative  . None   Additional History: History of major depressive disorder for 30 years Wellbutrin- hallucination  cymbalta- did fine   Assessment:   Musculoskeletal: Strength & Muscle Tone: within normal limits Gait & Station: normal Patient leans: N/A  Psychiatric Specialty Exam: Medication Refill     ROS  There were no vitals taken for this visit.There is no height or weight on file to calculate BMI.  General Appearance: Disheveled  Eye Contact:  Fair  Speech:  Clear and Coherent  Volume:  Normal  Mood:  Anxious  Affect:  Appropriate and Tearful  Thought Process:  Coherent  Orientation:  Full (Time, Place,  and Person)  Thought Content:  WDL  Suicidal Thoughts:  No  Homicidal Thoughts:  No  Memory:  Immediate;   Fair Recent;   Fair Remote;   Fair  Judgement:  Fair  Insight:  Present  Psychomotor Activity:  Normal  Concentration:  Poor  Recall:  [employer] of Knowledge: Fair  Language: Fair  Akathisia:  No  Handed:  Right  AIMS (if indicated):    Assets:  Communication Skills Desire for Improvement Housing Social Support  ADL's:  Intact  Cognition: WNL  Sleep:  okay   Is the patient at risk to self?  No. Has the patient been a risk to self in the past 6 months?  Yes.   Has the patient been a risk to self within the distant past?  Yes.   Is the patient a risk to others?  No. Has the patient been a risk to others in the past 6 months?  No. Has the patient been a risk to others within the distant past?  No.  Current Medications: Current Outpatient Prescriptions  Medication Sig Dispense Refill  . FLUoxetine (PROZAC) 20 MG capsule Take 2 capsules (40 mg total) by mouth 2 (two) times daily. Pt has supply 60 capsule 3   No current facility-administered medications for this visit.     Medical Decision Making:  Established Problem, Worsening (2), Review of Last Therapy Session (1), Review of Medication Regimen & Side Effects (2) and Review of New Medication or Change in Dosage (2)  Treatment Plan Summary:Medication management  Major depressive disorder:   Discussed with patient what the medications. She is currently taking Prozac 40mg  Advised patient that she is currently experiencing interaction due to the combination of lithium and Prozac and she should stop taking the Prozac. She is agreeable to restart taking the lithium as it increases the serotonin levels in the body and caused her muscle aches. She will start taking lithium 300 mg for 1 week and then titrate to 300 mg by mouth twice a day. She will start taking Cymbalta after that. She has a good response to Cymbalta in the  past. Advised her to call back if she notices worsening of her symptoms and she demonstrated understanding.   Follow-up in 3 weeks or earlier depending on her symptoms.      More than 50% of the time spent in psychoeducation, counseling and coordination of care.    This note was generated in part or whole with voice recognition software. Voice regonition is usually quite accurate but there are transcription errors that can and very often do occur. I apologize for any typographical errors that were  not detected and corrected.   Rainey Pines, MD 12/15/2015, 10:34 AM

## 2015-12-22 ENCOUNTER — Other Ambulatory Visit: Payer: Self-pay

## 2016-01-04 ENCOUNTER — Other Ambulatory Visit: Payer: Self-pay | Admitting: Psychiatry

## 2016-01-04 ENCOUNTER — Ambulatory Visit (INDEPENDENT_AMBULATORY_CARE_PROVIDER_SITE_OTHER): Payer: BLUE CROSS/BLUE SHIELD | Admitting: Psychiatry

## 2016-01-04 ENCOUNTER — Encounter: Payer: Self-pay | Admitting: Psychiatry

## 2016-01-04 VITALS — BP 142/97 | HR 114 | Temp 98.8°F | Wt 287.0 lb

## 2016-01-04 DIAGNOSIS — F411 Generalized anxiety disorder: Secondary | ICD-10-CM

## 2016-01-04 DIAGNOSIS — F332 Major depressive disorder, recurrent severe without psychotic features: Secondary | ICD-10-CM

## 2016-01-04 MED ORDER — ARIPIPRAZOLE 5 MG PO TABS: 5.0000 mg | ORAL_TABLET | Freq: Every day | ORAL | 1 refills | Status: DC

## 2016-01-04 MED ORDER — DULOXETINE HCL 30 MG PO CPEP: 30.0000 mg | ORAL_CAPSULE | Freq: Every day | ORAL | 1 refills | Status: DC

## 2016-01-04 MED ORDER — LISDEXAMFETAMINE DIMESYLATE 30 MG PO CAPS: 30.0000 mg | ORAL_CAPSULE | Freq: Every day | ORAL | 0 refills | Status: DC

## 2016-01-04 NOTE — Progress Notes (Signed)
BH MD/PA/NP OP Progress Note  01/04/2016 1:31 PM [name]  MRN:  [id number]  Subjective:  [name] is a 51 year old Caucasian female who presented for the follow-up appointment. She reported that she she has stopped taking the lithium as she was having problems on the medication. She reported that her muscle ache and body aches were getting worse. She is currently feeling very tired and sleepy and she is sleeping throughout the day. She reported that she is only awake for 4 hours per day. She found the old prescription of Vyvanse with only 2 pills in it. She took the medication and was awake and alert. She reported that her husband was happy to see her more upbeat and energetic. Patient does remember that she was taking Vyvanse in the past as prescribed by Dr. Jimmye Norman. We discussed about her medications. Patient reported that she wants her medications to be adjusted. She is not working at this time.   Patient currently denied having any suicidal homicidal ideations or plans. She reported that she wants to start taking medications on a regular basis and we reviewed her records. It was noted that she was on the combination of Cymbalta and Abilify. She was also taking Vyvanse for binge eating disorder. She is interested in restarting the medication.    Chief Complaint:  Chief Complaint    Follow-up; Medication Refill     Visit Diagnosis:     ICD-9-CM ICD-10-CM   1. Major depressive disorder, recurrent, severe without psychotic features ([place]) 296.33 F33.2   2. GAD (generalized anxiety disorder) 300.02 F41.1     Past Medical History:  Past Medical History:  Diagnosis Date  . ADD (attention deficit disorder)   . Asthma   . Back pain   . Breast abscess 1993   right  . Breast screening   . Depression   . Headache    sinus  . Hypertension    diet controlled  . Lump or mass in breast    left  . Obesity, unspecified   . PONV (postoperative nausea and vomiting)   . Screening for  obesity   . Tobacco use     Past Surgical History:  Procedure Laterality Date  . ABDOMINAL HYSTERECTOMY  2001  . BREAST BIOPSY Left 2013  . BREAST SURGERY Left 2014   excision of fibroadenoma.  . [place]  . CHOLECYSTECTOMY  1986  . ETHMOIDECTOMY Right 03/28/2015   Procedure: RIGHT ANTERIOR ETHMOIDECTOMY;  Surgeon: Clyde Canterbury, MD;  Location: [place];  Service: ENT;  Laterality: Right;  . IMAGE GUIDED SINUS SURGERY N/A 03/28/2015   Procedure: IMAGE GUIDED SINUS SURGERY;  Surgeon: Clyde Canterbury, MD;  Location: [place];  Service: ENT;  Laterality: N/A;  STRYKER gave disk to cece  . MAXILLARY ANTROSTOMY Right 03/28/2015   Procedure: MAXILLARY ANTROSTOMY;  Surgeon: Clyde Canterbury, MD;  Location: [place];  Service: ENT;  Laterality: Right;   Family History:  Family History  Problem Relation Age of Onset  . Other Other 65    Colon Cancer, Skin Cancer  . Other Maternal Uncle 70    Prostate Cancer  . Diabetes Mother   . Heart disease Mother   . Parkinson's disease Father   . Colon cancer Father   . Hypotension Father   . Alcohol abuse Father   . Seizures Sister   . Depression Sister   . Depression Sister    Social History:  Social History   Social  History  . Marital status: Married    Spouse name: N/A  . Number of children: N/A  . Years of education: N/A   Social History Main Topics  . Smoking status: Former Smoker    Packs/day: 1.00    Types: Cigarettes    Quit date: 09/27/1994  . Smokeless tobacco: Never Used  . Alcohol use No     Comment: 1x/6 mos  . Drug use: No  . Sexual activity: Yes    Birth control/ protection: None   Other Topics Concern  . None   Social History Narrative  . None   Additional History: History of major depressive disorder for 30 years Wellbutrin- hallucination  cymbalta- did fine   Assessment:   Musculoskeletal: Strength & Muscle Tone: within normal limits Gait & Station:  normal Patient leans: N/A  Psychiatric Specialty Exam: Medication Refill     ROS  Blood pressure (!) 142/97, pulse (!) 114, temperature 98.8 F (37.1 C), temperature source Oral, weight 287 lb (130.2 kg).Body mass index is 46.32 kg/m.  General Appearance: Disheveled  Eye Contact:  Fair  Speech:  Clear and Coherent  Volume:  Normal  Mood:  Anxious  Affect:  Appropriate and Tearful  Thought Process:  Coherent  Orientation:  Full (Time, Place, and Person)  Thought Content:  WDL  Suicidal Thoughts:  No  Homicidal Thoughts:  No  Memory:  Immediate;   Fair Recent;   Fair Remote;   Fair  Judgement:  Fair  Insight:  Present  Psychomotor Activity:  Normal  Concentration:  Poor  Recall:  [employer] of Knowledge: Fair  Language: Fair  Akathisia:  No  Handed:  Right  AIMS (if indicated):    Assets:  Communication Skills Desire for Improvement Housing Social Support  ADL's:  Intact  Cognition: WNL  Sleep:  okay   Is the patient at risk to self?  No. Has the patient been a risk to self in the past 6 months?  Yes.   Has the patient been a risk to self within the distant past?  Yes.   Is the patient a risk to others?  No. Has the patient been a risk to others in the past 6 months?  No. Has the patient been a risk to others within the distant past?  No.  Current Medications: Current Outpatient Prescriptions  Medication Sig Dispense Refill  . DULoxetine (CYMBALTA) 30 MG capsule Take 1 capsule (30 mg total) by mouth daily. 90 capsule 1  . ARIPiprazole (ABILIFY) 5 MG tablet Take 1 tablet (5 mg total) by mouth daily. 30 tablet 1  . lisdexamfetamine (VYVANSE) 30 MG capsule Take 1 capsule (30 mg total) by mouth daily. 30 capsule 0   No current facility-administered medications for this visit.     Medical Decision Making:  Established Problem, Worsening (2), Review of Last Therapy Session (1), Review of Medication Regimen & Side Effects (2) and Review of New Medication or Change  in Dosage (2)  Treatment Plan Summary:Medication management  Major depressive disorder:   Discussed with patient what the medications.   I will continue her on Cymbalta 30 mg daily. Start Abilify 5 mg daily Start Vyvanse 30 mg daily. Patient was given the coupon. Follow-up in 4 weeks or earlier depending on her symptoms  She agreed with the plan.   More than 50% of the time spent in psychoeducation, counseling and coordination of care.    This note was generated in part or whole with voice  recognition software. Voice regonition is usually quite accurate but there are transcription errors that can and very often do occur. I apologize for any typographical errors that were not detected and corrected.      More than 50% of the time spent in psychoeducation, counseling and coordination of care.    This note was generated in part or whole with voice recognition software. Voice regonition is usually quite accurate but there are transcription errors that can and very often do occur. I apologize for any typographical errors that were not detected and corrected.   Rainey Pines, MD 01/04/2016, 1:31 PM

## 2016-02-05 ENCOUNTER — Ambulatory Visit: Payer: BLUE CROSS/BLUE SHIELD | Admitting: Psychiatry

## 2016-02-07 ENCOUNTER — Ambulatory Visit (INDEPENDENT_AMBULATORY_CARE_PROVIDER_SITE_OTHER): Payer: BLUE CROSS/BLUE SHIELD | Admitting: Psychiatry

## 2016-02-07 ENCOUNTER — Encounter: Payer: Self-pay | Admitting: Psychiatry

## 2016-02-07 VITALS — BP 217/148 | HR 114 | Temp 98.3°F | Wt 289.4 lb

## 2016-02-07 DIAGNOSIS — F5081 Binge eating disorder: Secondary | ICD-10-CM | POA: Diagnosis not present

## 2016-02-07 DIAGNOSIS — F411 Generalized anxiety disorder: Secondary | ICD-10-CM | POA: Diagnosis not present

## 2016-02-07 DIAGNOSIS — F332 Major depressive disorder, recurrent severe without psychotic features: Secondary | ICD-10-CM

## 2016-02-07 MED ORDER — LISDEXAMFETAMINE DIMESYLATE 30 MG PO CAPS: 30.0000 mg | ORAL_CAPSULE | Freq: Every day | ORAL | 0 refills | Status: DC

## 2016-02-07 MED ORDER — ARIPIPRAZOLE 5 MG PO TABS: 5.0000 mg | ORAL_TABLET | Freq: Every day | ORAL | 1 refills | Status: DC

## 2016-02-07 MED ORDER — DULOXETINE HCL 30 MG PO CPEP: 30.0000 mg | ORAL_CAPSULE | Freq: Every day | ORAL | 1 refills | Status: DC

## 2016-02-07 NOTE — Progress Notes (Signed)
BH MD/PA/NP OP Progress Note  02/07/2016 12:42 PM [name]  MRN:  [id number]  Subjective:  Ms. [name] is a 50 year old Caucasian female who presented for the follow-up appointment. She reported that she has been doing better on her current medications. She reported that the Vyvanse really helped her and she has started noticing significant improvement in her mood. She reported that she was taking Vyvanse in the past and it was helpful. She reported that she has tried CBD oil  with her son and she had the out of body experience for almost 3 days due to the CBD oil. She was very worried and concerned and almost tearful. She reported that she has never had this experience in her life. She felt that she almost died. Patient reported that now she is feeling better. Patient stated that she is not going to use any other drugs.  She has been compliant with her medications. She reported that she does not take any naps during the daytime. She does not feel sleepy or tired and has been feeling more energetic.   Patient appeared calm and alert during the interview. She currently denied having any suicidal homicidal ideations or plans.  Chief Complaint:  Chief Complaint    Follow-up; Medication Refill     Visit Diagnosis:     ICD-9-CM ICD-10-CM   1. Major depressive disorder, recurrent, severe without psychotic features ([place]) 296.33 F33.2   2. Binge eating disorder 307.50 F50.81   3. GAD (generalized anxiety disorder) 300.02 F41.1     Past Medical History:  Past Medical History:  Diagnosis Date  . ADD (attention deficit disorder)   . Asthma   . Back pain   . Breast abscess 1993   right  . Breast screening   . Depression   . Headache    sinus  . Hypertension    diet controlled  . Lump or mass in breast    left  . Obesity, unspecified   . PONV (postoperative nausea and vomiting)   . Screening for obesity   . Tobacco use     Past Surgical History:  Procedure Laterality Date  .  ABDOMINAL HYSTERECTOMY  2001  . BREAST BIOPSY Left 2013  . BREAST SURGERY Left 2014   excision of fibroadenoma.  . [place]  . CHOLECYSTECTOMY  1986  . ETHMOIDECTOMY Right 03/28/2015   Procedure: RIGHT ANTERIOR ETHMOIDECTOMY;  Surgeon: Clyde Canterbury, MD;  Location: [place];  Service: ENT;  Laterality: Right;  . IMAGE GUIDED SINUS SURGERY N/A 03/28/2015   Procedure: IMAGE GUIDED SINUS SURGERY;  Surgeon: Clyde Canterbury, MD;  Location: [place];  Service: ENT;  Laterality: N/A;  STRYKER gave disk to cece  . MAXILLARY ANTROSTOMY Right 03/28/2015   Procedure: MAXILLARY ANTROSTOMY;  Surgeon: Clyde Canterbury, MD;  Location: [place];  Service: ENT;  Laterality: Right;   Family History:  Family History  Problem Relation Age of Onset  . Other Other 65    Colon Cancer, Skin Cancer  . Other Maternal Uncle 70    Prostate Cancer  . Diabetes Mother   . Heart disease Mother   . Parkinson's disease Father   . Colon cancer Father   . Hypotension Father   . Alcohol abuse Father   . Seizures Sister   . Depression Sister   . Depression Sister    Social History:  Social History   Social History  . Marital status: Married    Spouse name:  N/A  . Number of children: N/A  . Years of education: N/A   Social History Main Topics  . Smoking status: Former Smoker    Packs/day: 1.00    Types: Cigarettes    Quit date: 09/27/1994  . Smokeless tobacco: Never Used  . Alcohol use No     Comment: 1x/6 mos  . Drug use: No  . Sexual activity: Yes    Birth control/ protection: None   Other Topics Concern  . None   Social History Narrative  . None   Additional History: History of major depressive disorder for 30 years Wellbutrin- hallucination  cymbalta- did fine   Assessment:   Musculoskeletal: Strength & Muscle Tone: within normal limits Gait & Station: normal Patient leans: N/A  Psychiatric Specialty Exam: Medication Refill     ROS   Blood pressure (!) 217/148, pulse (!) 114, temperature 98.3 F (36.8 C), temperature source Oral, weight 289 lb 6.4 oz (131.3 kg).Body mass index is 46.71 kg/m.  General Appearance: Disheveled  Eye Contact:  Fair  Speech:  Clear and Coherent  Volume:  Normal  Mood:  Anxious  Affect:  Appropriate and Tearful  Thought Process:  Coherent  Orientation:  Full (Time, Place, and Person)  Thought Content:  WDL  Suicidal Thoughts:  No  Homicidal Thoughts:  No  Memory:  Immediate;   Fair Recent;   Fair Remote;   Fair  Judgement:  Fair  Insight:  Present  Psychomotor Activity:  Normal  Concentration:  Poor  Recall:  [employer] of Knowledge: Fair  Language: Fair  Akathisia:  No  Handed:  Right  AIMS (if indicated):    Assets:  Communication Skills Desire for Improvement Housing Social Support  ADL's:  Intact  Cognition: WNL  Sleep:  okay   Is the patient at risk to self?  No. Has the patient been a risk to self in the past 6 months?  Yes.   Has the patient been a risk to self within the distant past?  Yes.   Is the patient a risk to others?  No. Has the patient been a risk to others in the past 6 months?  No. Has the patient been a risk to others within the distant past?  No.  Current Medications: Current Outpatient Prescriptions  Medication Sig Dispense Refill  . ARIPiprazole (ABILIFY) 5 MG tablet Take 1 tablet (5 mg total) by mouth daily. 90 tablet 1  . DULoxetine (CYMBALTA) 30 MG capsule Take 1 capsule (30 mg total) by mouth daily. 90 capsule 1  . lisdexamfetamine (VYVANSE) 30 MG capsule Take 1 capsule (30 mg total) by mouth daily. To be filled 03/08/16 30 capsule 0   No current facility-administered medications for this visit.     Medical Decision Making:  Established Problem, Worsening (2), Review of Last Therapy Session (1), Review of Medication Regimen & Side Effects (2) and Review of New Medication or Change in Dosage (2)  Treatment Plan Summary:Medication management   Major depressive disorder:   Discussed with patient what the medications.   I will continue her on Cymbalta 30 mg daily. Continue  Abilify 5 mg daily Continue Vyvanse 30 mg daily. Patient was given the coupon. Follow-up in 2 months or earlier depending on her symptoms  She agreed with the plan.   More than 50% of the time spent in psychoeducation, counseling and coordination of care.    This note was generated in part or whole with voice recognition software. Voice regonition  is usually quite accurate but there are transcription errors that can and very often do occur. I apologize for any typographical errors that were not detected and corrected.      More than 50% of the time spent in psychoeducation, counseling and coordination of care.    This note was generated in part or whole with voice recognition software. Voice regonition is usually quite accurate but there are transcription errors that can and very often do occur. I apologize for any typographical errors that were not detected and corrected.   Rainey Pines, MD 02/07/2016, 12:42 PM

## 2016-03-27 ENCOUNTER — Ambulatory Visit (INDEPENDENT_AMBULATORY_CARE_PROVIDER_SITE_OTHER): Payer: BLUE CROSS/BLUE SHIELD | Admitting: Psychiatry

## 2016-03-27 ENCOUNTER — Encounter: Payer: Self-pay | Admitting: Psychiatry

## 2016-03-27 ENCOUNTER — Ambulatory Visit: Payer: BLUE CROSS/BLUE SHIELD | Admitting: Psychiatry

## 2016-03-27 VITALS — BP 151/100 | HR 103 | Temp 98.3°F | Wt 281.8 lb

## 2016-03-27 DIAGNOSIS — F332 Major depressive disorder, recurrent severe without psychotic features: Secondary | ICD-10-CM

## 2016-03-27 DIAGNOSIS — F411 Generalized anxiety disorder: Secondary | ICD-10-CM

## 2016-03-27 DIAGNOSIS — F5081 Binge eating disorder: Secondary | ICD-10-CM

## 2016-03-27 MED ORDER — LISDEXAMFETAMINE DIMESYLATE 30 MG PO CAPS: 30.0000 mg | ORAL_CAPSULE | Freq: Every day | ORAL | 0 refills | Status: DC

## 2016-03-27 MED ORDER — DULOXETINE HCL 30 MG PO CPEP: 30.0000 mg | ORAL_CAPSULE | Freq: Every day | ORAL | 1 refills | Status: DC

## 2016-03-27 MED ORDER — ARIPIPRAZOLE 5 MG PO TABS: 5.0000 mg | ORAL_TABLET | Freq: Every day | ORAL | 1 refills | Status: DC

## 2016-03-27 NOTE — Progress Notes (Signed)
BH MD/PA/NP OP Progress Note  03/27/2016 2:47 PM NEOLA DOCKWEILER  MRN:  [id number]  Subjective:  [name] is a 51 year old Caucasian female who presented for the follow-up appointment. She reported that she has been doing better on her current medications. She reported that the Vyvanse really helped her and she has started noticing significant improvement in her mood. She reported that she has already lost 13 pounds as she has been doing Weight Watchers. She reported that she is feeling better. Her blood pressure and pulse was elevated. We discussed about that in detail. She reported that she does not have any other provider and I advised her that she needed to start seeing a primary care physician on a regular basis. She agreed with the plan. She reported that she feels active and energetic throughout the day. She does not want to change any medications at this time. She currently denied having any suicidal homicidal ideations or plans.    Patient appeared calm and alert during the interview. She currently denied having any suicidal homicidal ideations or plans.  Chief Complaint:  Chief Complaint    Follow-up; Medication Refill     Visit Diagnosis:     ICD-9-CM ICD-10-CM   1. Major depressive disorder, recurrent, severe without psychotic features ([place]) 296.33 F33.2   2. Binge eating disorder 307.50 F50.81   3. GAD (generalized anxiety disorder) 300.02 F41.1     Past Medical History:  Past Medical History:  Diagnosis Date  . ADD (attention deficit disorder)   . Asthma   . Back pain   . Breast abscess 1993   right  . Breast screening   . Depression   . Headache    sinus  . Hypertension    diet controlled  . Lump or mass in breast    left  . Obesity, unspecified   . PONV (postoperative nausea and vomiting)   . Screening for obesity   . Tobacco use     Past Surgical History:  Procedure Laterality Date  . ABDOMINAL HYSTERECTOMY  2001  . BREAST BIOPSY Left 2013  . BREAST  SURGERY Left 2014   excision of fibroadenoma.  . [place]  . CHOLECYSTECTOMY  1986  . ETHMOIDECTOMY Right 03/28/2015   Procedure: RIGHT ANTERIOR ETHMOIDECTOMY;  Surgeon: Clyde Canterbury, MD;  Location: [place];  Service: ENT;  Laterality: Right;  . IMAGE GUIDED SINUS SURGERY N/A 03/28/2015   Procedure: IMAGE GUIDED SINUS SURGERY;  Surgeon: Clyde Canterbury, MD;  Location: [place];  Service: ENT;  Laterality: N/A;  STRYKER gave disk to cece  . MAXILLARY ANTROSTOMY Right 03/28/2015   Procedure: MAXILLARY ANTROSTOMY;  Surgeon: Clyde Canterbury, MD;  Location: [place];  Service: ENT;  Laterality: Right;   Family History:  Family History  Problem Relation Age of Onset  . Other Other 65    Colon Cancer, Skin Cancer  . Other Maternal Uncle 70    Prostate Cancer  . Diabetes Mother   . Heart disease Mother   . Parkinson's disease Father   . Colon cancer Father   . Hypotension Father   . Alcohol abuse Father   . Seizures Sister   . Depression Sister   . Depression Sister    Social History:  Social History   Social History  . Marital status: Married    Spouse name: N/A  . Number of children: N/A  . Years of education: N/A   Social History Main Topics  .  Smoking status: Former Smoker    Packs/day: 1.00    Types: Cigarettes    Quit date: 09/27/1994  . Smokeless tobacco: Never Used  . Alcohol use No     Comment: 1x/6 mos  . Drug use: No  . Sexual activity: Yes    Birth control/ protection: None   Other Topics Concern  . None   Social History Narrative  . None   Additional History: History of major depressive disorder for 30 years Wellbutrin- hallucination  cymbalta- did fine   Assessment:   Musculoskeletal: Strength & Muscle Tone: within normal limits Gait & Station: normal Patient leans: N/A  Psychiatric Specialty Exam: Medication Refill     ROS  Blood pressure (!) 151/100, pulse (!) 103, temperature 98.3 F (36.8  C), temperature source Oral, weight 281 lb 12.8 oz (127.8 kg).Body mass index is 45.48 kg/m.  General Appearance: Disheveled  Eye Contact:  Fair  Speech:  Clear and Coherent  Volume:  Normal  Mood:  Anxious  Affect:  Appropriate and Tearful  Thought Process:  Coherent  Orientation:  Full (Time, Place, and Person)  Thought Content:  WDL  Suicidal Thoughts:  No  Homicidal Thoughts:  No  Memory:  Immediate;   Fair Recent;   Fair Remote;   Fair  Judgement:  Fair  Insight:  Present  Psychomotor Activity:  Normal  Concentration:  Poor  Recall:  [employer] of Knowledge: Fair  Language: Fair  Akathisia:  No  Handed:  Right  AIMS (if indicated):    Assets:  Communication Skills Desire for Improvement Housing Social Support  ADL's:  Intact  Cognition: WNL  Sleep:  okay   Is the patient at risk to self?  No. Has the patient been a risk to self in the past 6 months?  Yes.   Has the patient been a risk to self within the distant past?  Yes.   Is the patient a risk to others?  No. Has the patient been a risk to others in the past 6 months?  No. Has the patient been a risk to others within the distant past?  No.  Current Medications: Current Outpatient Prescriptions  Medication Sig Dispense Refill  . ARIPiprazole (ABILIFY) 5 MG tablet Take 1 tablet (5 mg total) by mouth daily. 90 tablet 1  . DULoxetine (CYMBALTA) 30 MG capsule Take 1 capsule (30 mg total) by mouth daily. 90 capsule 1  . lisdexamfetamine (VYVANSE) 30 MG capsule Take 1 capsule (30 mg total) by mouth daily. To be filled 04/06/16 30 capsule 0  . lisdexamfetamine (VYVANSE) 30 MG capsule Take 1 capsule (30 mg total) by mouth daily. To be filled 05/06/16 30 capsule 0   No current facility-administered medications for this visit.     Medical Decision Making:  Established Problem, Worsening (2), Review of Last Therapy Session (1), Review of Medication Regimen & Side Effects (2) and Review of New Medication or Change in  Dosage (2)  Treatment Plan Summary:Medication management  Major depressive disorder:   Discussed with patient what the medications.   I will continue her on Cymbalta 30 mg daily. Continue  Abilify 5 mg daily Continue Vyvanse 30 mg daily. Patient was given the coupon. Follow-up in 2 months or earlier depending on her symptoms  She agreed with the plan.   More than 50% of the time spent in psychoeducation, counseling and coordination of care.    This note was generated in part or whole with voice recognition software.  Voice regonition is usually quite accurate but there are transcription errors that can and very often do occur. I apologize for any typographical errors that were not detected and corrected.      More than 50% of the time spent in psychoeducation, counseling and coordination of care.    This note was generated in part or whole with voice recognition software. Voice regonition is usually quite accurate but there are transcription errors that can and very often do occur. I apologize for any typographical errors that were not detected and corrected.   Rainey Pines, MD 03/27/2016, 2:47 PM

## 2016-04-25 ENCOUNTER — Telehealth: Payer: Self-pay

## 2016-04-25 NOTE — Telephone Encounter (Signed)
pt called states that she is all to pieces they just received bad new that her son may have spinal cancer and she is all to pieces and needs something to help her get her anxiety and nerves under control.

## 2016-04-29 ENCOUNTER — Other Ambulatory Visit: Payer: Self-pay | Admitting: Psychiatry

## 2016-04-29 MED ORDER — BUSPIRONE HCL 10 MG PO TABS: 10.0000 mg | ORAL_TABLET | Freq: Three times a day (TID) | ORAL | 1 refills | Status: DC

## 2016-04-29 NOTE — Telephone Encounter (Signed)
called in #21 tablet.  a weeks worth of buspar until pt can get from mail order pharmacy.

## 2016-04-29 NOTE — Progress Notes (Unsigned)
bus

## 2016-04-29 NOTE — Telephone Encounter (Signed)
Started on Buspar- 10 mg - tid . Please let pt know about the meds

## 2016-04-29 NOTE — Telephone Encounter (Signed)
spoke with patient.  gave instructions pt asked if a weeks worth could be called into [facility].

## 2016-05-22 ENCOUNTER — Encounter: Payer: Self-pay | Admitting: Psychiatry

## 2016-05-22 ENCOUNTER — Ambulatory Visit (INDEPENDENT_AMBULATORY_CARE_PROVIDER_SITE_OTHER): Payer: BLUE CROSS/BLUE SHIELD | Admitting: Psychiatry

## 2016-05-22 VITALS — BP 124/85 | HR 103 | Temp 97.3°F | Wt 269.4 lb

## 2016-05-22 DIAGNOSIS — F332 Major depressive disorder, recurrent severe without psychotic features: Secondary | ICD-10-CM

## 2016-05-22 DIAGNOSIS — F5081 Binge eating disorder: Secondary | ICD-10-CM

## 2016-05-22 MED ORDER — DULOXETINE HCL 30 MG PO CPEP: 30.0000 mg | ORAL_CAPSULE | Freq: Every day | ORAL | 1 refills | Status: DC

## 2016-05-22 MED ORDER — BUSPIRONE HCL 10 MG PO TABS: 10.0000 mg | ORAL_TABLET | Freq: Three times a day (TID) | ORAL | 1 refills | Status: DC

## 2016-05-22 MED ORDER — ARIPIPRAZOLE 5 MG PO TABS: 5.0000 mg | ORAL_TABLET | Freq: Every day | ORAL | 1 refills | Status: DC

## 2016-05-22 MED ORDER — LISDEXAMFETAMINE DIMESYLATE 30 MG PO CAPS: 30.0000 mg | ORAL_CAPSULE | Freq: Every day | ORAL | 0 refills | Status: DC

## 2016-05-22 NOTE — Progress Notes (Signed)
BH MD/PA/NP OP Progress Note  05/22/2016 10:50 AM [name]  MRN:  [id number]  Subjective:  [name] is a 51 year old Caucasian female who presented for the follow-up appointment. [name] reported that [name] was having issues with [name] son who was diagnosed with having spinal pain and is going to have an appointment in early April. [name] reported that he was prescribed pain medications and he took all the pain medications in 2 days. [name] reported that [name] took him to the primary care doctor who discontinued all his medications. Now he is upset with [name] and is not talking. He is currently living with his girlfriend. [name] reported that [name] is concerned about his behavior. Patient reported that he is going to be evicted from his apartment due to the black mold.  Patient reported that he is not planning to live with them. Patient stated that [name] has been losing weight with the help of Vyvanse and [name] is happy about the same. We discussed about [name] medications. [name] stated that [name] is cooking healthy as well. [name] reported that [name] wants to go higher on the dose of the medications including Vyvanse but we discussed about the risk associated with a high dose of stimulant medications especially at [name] age. [name] reported that [name] has noticed that [name] blood pressure is under control since [name] has changed [name] primary care physician.  [name] denied having any suicidal homicidal ideation or plans. [name] denied having any perceptual disturbances. [name] appeared calm and alert during the interview.    Chief Complaint:  Chief Complaint    Follow-up; Medication Refill     Visit Diagnosis:     ICD-9-CM ICD-10-CM   1. Major depressive disorder, recurrent, severe without psychotic features ([place]) 296.33 F33.2   2. Binge eating disorder 307.50 F50.81     Past Medical History:  Past Medical History:  Diagnosis Date  . ADD (attention deficit disorder)   . Asthma   . Back pain   . Breast abscess 1993   right  . Breast  screening   . Depression   . Headache    sinus  . Hypertension    diet controlled  . Lump or mass in breast    left  . Obesity, unspecified   . PONV (postoperative nausea and vomiting)   . Screening for obesity   . Tobacco use     Past Surgical History:  Procedure Laterality Date  . ABDOMINAL HYSTERECTOMY  2001  . BREAST BIOPSY Left 2013  . BREAST SURGERY Left 2014   excision of fibroadenoma.  . [place]  . CHOLECYSTECTOMY  1986  . ETHMOIDECTOMY Right 03/28/2015   Procedure: RIGHT ANTERIOR ETHMOIDECTOMY;  Surgeon: Clyde Canterbury, MD;  Location: [place];  Service: ENT;  Laterality: Right;  . IMAGE GUIDED SINUS SURGERY N/A 03/28/2015   Procedure: IMAGE GUIDED SINUS SURGERY;  Surgeon: Clyde Canterbury, MD;  Location: [place];  Service: ENT;  Laterality: N/A;  STRYKER gave disk to cece  . MAXILLARY ANTROSTOMY Right 03/28/2015   Procedure: MAXILLARY ANTROSTOMY;  Surgeon: Clyde Canterbury, MD;  Location: [facility];  Service: ENT;  Laterality: Right;   Family History:  Family History  Problem Relation Age of Onset  . Other Other 65    Colon Cancer, Skin Cancer  . Other Maternal Uncle 70    Prostate Cancer  . Diabetes Mother   . Heart disease Mother   . Parkinson's disease Father   . Colon cancer  Father   . Hypotension Father   . Alcohol abuse Father   . Seizures Sister   . Depression Sister   . Depression Sister    Social History:  Social History   Social History  . Marital status: Married    Spouse name: N/A  . Number of children: N/A  . Years of education: N/A   Social History Main Topics  . Smoking status: Former Smoker    Packs/day: 1.00    Types: Cigarettes    Quit date: 09/27/1994  . Smokeless tobacco: Never Used  . Alcohol use No     Comment: 1x/6 mos  . Drug use: No  . Sexual activity: Yes    Birth control/ protection: None   Other Topics Concern  . None   Social History Narrative  . None   Additional  History: History of major depressive disorder for 30 years Wellbutrin- hallucination  cymbalta- did fine   Assessment:   Musculoskeletal: Strength & Muscle Tone: within normal limits Gait & Station: normal Patient leans: N/A  Psychiatric Specialty Exam: Medication Refill     ROS  Blood pressure 124/85, pulse (!) 103, temperature 97.3 F (36.3 C), temperature source Oral, weight 269 lb 6.4 oz (122.2 kg).Body mass index is 43.48 kg/m.  General Appearance: Disheveled  Eye Contact:  Fair  Speech:  Clear and Coherent  Volume:  Normal  Mood:  Anxious  Affect:  Appropriate and Tearful  Thought Process:  Coherent  Orientation:  Full (Time, Place, and Person)  Thought Content:  WDL  Suicidal Thoughts:  No  Homicidal Thoughts:  No  Memory:  Immediate;   Fair Recent;   Fair Remote;   Fair  Judgement:  Fair  Insight:  Present  Psychomotor Activity:  Normal  Concentration:  Poor  Recall:  [employer] of Knowledge: Fair  Language: Fair  Akathisia:  No  Handed:  Right  AIMS (if indicated):    Assets:  Communication Skills Desire for Improvement Housing Social Support  ADL's:  Intact  Cognition: WNL  Sleep:  okay   Is the patient at risk to self?  No. Has the patient been a risk to self in the past 6 months?  Yes.   Has the patient been a risk to self within the distant past?  Yes.   Is the patient a risk to others?  No. Has the patient been a risk to others in the past 6 months?  No. Has the patient been a risk to others within the distant past?  No.  Current Medications: Current Outpatient Prescriptions  Medication Sig Dispense Refill  . ARIPiprazole (ABILIFY) 5 MG tablet Take 1 tablet (5 mg total) by mouth daily. 90 tablet 1  . busPIRone (BUSPAR) 10 MG tablet Take 1 tablet (10 mg total) by mouth 3 (three) times daily. Pt has supply 90 tablet 1  . DULoxetine (CYMBALTA) 30 MG capsule Take 1 capsule (30 mg total) by mouth daily. 90 capsule 1  . lisdexamfetamine  (VYVANSE) 30 MG capsule Take 1 capsule (30 mg total) by mouth daily. To be filled 06/19/16 30 capsule 0  . lisdexamfetamine (VYVANSE) 30 MG capsule Take 1 capsule (30 mg total) by mouth daily. To be filled 07/18/16 30 capsule 0   No current facility-administered medications for this visit.     Medical Decision Making:  Established Problem, Worsening (2), Review of Last Therapy Session (1), Review of Medication Regimen & Side Effects (2) and Review of New Medication or Change  in Dosage (2)  Treatment Plan Summary:Medication management  Major depressive disorder:   Discussed with patient what the medications.   I will continue [name] on Cymbalta 30 mg daily. Continue  Abilify 5 mg daily Continue Vyvanse 30 mg daily. Patient was given the coupon.2 month supply of the medications was refilled and [name] will fill [name] next prescription in April as [name] has recently filled the medication on 05/20/2016 as I checked [name] medications on the [facility] controlled registry. Follow-up in 2 months or earlier depending on [name] symptoms  [name] agreed with the plan.   More than 50% of the time spent in psychoeducation, counseling and coordination of care.    This note was generated in part or whole with voice recognition software. Voice regonition is usually quite accurate but there are transcription errors that can and very often do occur. I apologize for any typographical errors that were not detected and corrected.      More than 50% of the time spent in psychoeducation, counseling and coordination of care.    This note was generated in part or whole with voice recognition software. Voice regonition is usually quite accurate but there are transcription errors that can and very often do occur. I apologize for any typographical errors that were not detected and corrected.   Rainey Pines, MD 05/22/2016, 10:50 AM

## 2016-08-21 ENCOUNTER — Encounter: Payer: Self-pay | Admitting: Psychiatry

## 2016-08-21 ENCOUNTER — Ambulatory Visit (INDEPENDENT_AMBULATORY_CARE_PROVIDER_SITE_OTHER): Payer: BLUE CROSS/BLUE SHIELD | Admitting: Psychiatry

## 2016-08-21 VITALS — BP 112/78 | HR 89 | Temp 97.9°F | Wt 267.6 lb

## 2016-08-21 DIAGNOSIS — F5081 Binge eating disorder: Secondary | ICD-10-CM

## 2016-08-21 DIAGNOSIS — F331 Major depressive disorder, recurrent, moderate: Secondary | ICD-10-CM

## 2016-08-21 MED ORDER — BUSPIRONE HCL 10 MG PO TABS: 10.0000 mg | ORAL_TABLET | Freq: Every day | ORAL | 1 refills | Status: DC

## 2016-08-21 MED ORDER — DULOXETINE HCL 30 MG PO CPEP: 30.0000 mg | ORAL_CAPSULE | Freq: Every day | ORAL | 1 refills | Status: DC

## 2016-08-21 MED ORDER — ARIPIPRAZOLE 10 MG PO TABS: 10.0000 mg | ORAL_TABLET | Freq: Every day | ORAL | 1 refills | Status: DC

## 2016-08-21 MED ORDER — LISDEXAMFETAMINE DIMESYLATE 40 MG PO CAPS: 40.0000 mg | ORAL_CAPSULE | Freq: Every day | ORAL | 0 refills | Status: DC

## 2016-08-21 NOTE — Progress Notes (Signed)
BH MD/PA/NP OP Progress Note  08/21/2016 10:36 AM [name]  MRN:  [id number]  Subjective:  [name] is a 51 year old Caucasian female who presented for the follow-up appointment. She reported that she was having issues with her sons as well as financial difficulty. Patient reported that one of her son has been diagnosed with spinal cancer and he already had the surgery and is living with them now. She reported that her husband is not getting over time. She is trying to support her son. Her other son is planning to move to [place] in [place]. She is concerned about his behavior. Patient discussed about her medications in detail. She reported that Vyvanse is not helping with her weight loss and she wants to go higher on the dose. She also reported that the Abilify is not helping with her mood stabilization. She wants to go higher on the dose of the medications. She appeared apprehensive and anxious during the interview. She currently denied having any suicidal homicidal ideations or plans.     Chief Complaint:  Chief Complaint    Follow-up; Medication Refill     Visit Diagnosis:     ICD-10-CM   1. MDD (major depressive disorder), recurrent episode, moderate ([facility]) F33.1   2. Binge eating disorder F50.81     Past Medical History:  Past Medical History:  Diagnosis Date  . ADD (attention deficit disorder)   . Asthma   . Back pain   . Breast abscess 1993   right  . Breast screening   . Depression   . Headache    sinus  . Hypertension    diet controlled  . Lump or mass in breast    left  . Obesity, unspecified   . PONV (postoperative nausea and vomiting)   . Screening for obesity   . Tobacco use     Past Surgical History:  Procedure Laterality Date  . ABDOMINAL HYSTERECTOMY  2001  . BREAST BIOPSY Left 2013  . BREAST SURGERY Left 2014   excision of fibroadenoma.  . [place]  . CHOLECYSTECTOMY  1986  . ETHMOIDECTOMY Right 03/28/2015    Procedure: RIGHT ANTERIOR ETHMOIDECTOMY;  Surgeon: Clyde Canterbury, MD;  Location: [place];  Service: ENT;  Laterality: Right;  . IMAGE GUIDED SINUS SURGERY N/A 03/28/2015   Procedure: IMAGE GUIDED SINUS SURGERY;  Surgeon: Clyde Canterbury, MD;  Location: [facility];  Service: ENT;  Laterality: N/A;  STRYKER gave disk to cece  . MAXILLARY ANTROSTOMY Right 03/28/2015   Procedure: MAXILLARY ANTROSTOMY;  Surgeon: Clyde Canterbury, MD;  Location: [place];  Service: ENT;  Laterality: Right;   Family History:  Family History  Problem Relation Age of Onset  . Other Other 65       Colon Cancer, Skin Cancer  . Other Maternal Uncle 70       Prostate Cancer  . Diabetes Mother   . Heart disease Mother   . Parkinson's disease Father   . Colon cancer Father   . Hypotension Father   . Alcohol abuse Father   . Seizures Sister   . Depression Sister   . Depression Sister    Social History:  Social History   Social History  . Marital status: Married    Spouse name: N/A  . Number of children: N/A  . Years of education: N/A   Social History Main Topics  . Smoking status: Former Smoker    Packs/day: 1.00  Types: Cigarettes    Quit date: 09/27/1994  . Smokeless tobacco: Never Used  . Alcohol use No     Comment: 1x/6 mos  . Drug use: No  . Sexual activity: Yes    Birth control/ protection: None   Other Topics Concern  . None   Social History Narrative  . None   Additional History: History of major depressive disorder for 30 years Wellbutrin- hallucination  cymbalta- did fine   Assessment:   Musculoskeletal: Strength & Muscle Tone: within normal limits Gait & Station: normal Patient leans: N/A  Psychiatric Specialty Exam: Medication Refill     ROS  Blood pressure 112/78, pulse 89, temperature 97.9 F (36.6 C), temperature source Oral, weight 267 lb 9.6 oz (121.4 kg).Body mass index is 43.19 kg/m.  General Appearance: Disheveled  Eye Contact:  Fair   Speech:  Clear and Coherent  Volume:  Normal  Mood:  Anxious  Affect:  Appropriate and Tearful  Thought Process:  Coherent  Orientation:  Full (Time, Place, and Person)  Thought Content:  WDL  Suicidal Thoughts:  No  Homicidal Thoughts:  No  Memory:  Immediate;   Fair Recent;   Fair Remote;   Fair  Judgement:  Fair  Insight:  Present  Psychomotor Activity:  Normal  Concentration:  Poor  Recall:  [employer] of Knowledge: Fair  Language: Fair  Akathisia:  No  Handed:  Right  AIMS (if indicated):    Assets:  Communication Skills Desire for Improvement Housing Social Support  ADL's:  Intact  Cognition: WNL  Sleep:  okay   Is the patient at risk to self?  No. Has the patient been a risk to self in the past 6 months?  Yes.   Has the patient been a risk to self within the distant past?  Yes.   Is the patient a risk to others?  No. Has the patient been a risk to others in the past 6 months?  No. Has the patient been a risk to others within the distant past?  No.  Current Medications: Current Outpatient Prescriptions  Medication Sig Dispense Refill  . ARIPiprazole (ABILIFY) 10 MG tablet Take 1 tablet (10 mg total) by mouth daily. 90 tablet 1  . busPIRone (BUSPAR) 10 MG tablet Take 1 tablet (10 mg total) by mouth daily. 90 tablet 1  . busPIRone (BUSPAR) 10 MG tablet Take 1 tablet (10 mg total) by mouth daily. Pt has supply 90 tablet 1  . DULoxetine (CYMBALTA) 30 MG capsule Take 1 capsule (30 mg total) by mouth daily. 90 capsule 1  . DULoxetine (CYMBALTA) 30 MG capsule Take 1 capsule (30 mg total) by mouth daily. 90 capsule 1  . lisdexamfetamine (VYVANSE) 40 MG capsule Take 1 capsule (40 mg total) by mouth daily. 30 capsule 0  . lisdexamfetamine (VYVANSE) 40 MG capsule Take 1 capsule (40 mg total) by mouth daily. To be filled 09/20/16 40 capsule 0   No current facility-administered medications for this visit.     Medical Decision Making:  Established Problem, Worsening (2),  Review of Last Therapy Session (1), Review of Medication Regimen & Side Effects (2) and Review of New Medication or Change in Dosage (2)  Treatment Plan Summary:Medication management  Major depressive disorder:   Discussed with patient what the medications.   I will continue her on Cymbalta 30 mg daily. Continue  Abilify 10  mg daily Continue Vyvanse 40  mg daily. Patient was given the coupon.2 month supply of  the medications was  Given.   Follow-up in 2 months or earlier depending on her symptoms  She agreed with the plan.   More than 50% of the time spent in psychoeducation, counseling and coordination of care.    This note was generated in part or whole with voice recognition software. Voice regonition is usually quite accurate but there are transcription errors that can and very often do occur. I apologize for any typographical errors that were not detected and corrected.      More than 50% of the time spent in psychoeducation, counseling and coordination of care.    This note was generated in part or whole with voice recognition software. Voice regonition is usually quite accurate but there are transcription errors that can and very often do occur. I apologize for any typographical errors that were not detected and corrected.   Rainey Pines, MD 08/21/2016, 10:36 AM

## 2016-10-21 ENCOUNTER — Ambulatory Visit: Payer: BLUE CROSS/BLUE SHIELD | Admitting: Psychiatry

## 2016-10-21 ENCOUNTER — Encounter: Payer: Self-pay | Admitting: Psychiatry

## 2016-10-21 ENCOUNTER — Ambulatory Visit (INDEPENDENT_AMBULATORY_CARE_PROVIDER_SITE_OTHER): Payer: BLUE CROSS/BLUE SHIELD | Admitting: Psychiatry

## 2016-10-21 VITALS — BP 110/74 | HR 89 | Ht 66.5 in | Wt 264.0 lb

## 2016-10-21 DIAGNOSIS — F331 Major depressive disorder, recurrent, moderate: Secondary | ICD-10-CM | POA: Diagnosis not present

## 2016-10-21 DIAGNOSIS — F5081 Binge eating disorder: Secondary | ICD-10-CM

## 2016-10-21 MED ORDER — BUSPIRONE HCL 10 MG PO TABS: 10.0000 mg | ORAL_TABLET | Freq: Two times a day (BID) | ORAL | 1 refills | Status: DC

## 2016-10-21 MED ORDER — LISDEXAMFETAMINE DIMESYLATE 10 MG PO CAPS: 10.0000 mg | ORAL_CAPSULE | Freq: Every day | ORAL | 0 refills | Status: DC

## 2016-10-21 MED ORDER — ARIPIPRAZOLE 10 MG PO TABS: 10.0000 mg | ORAL_TABLET | Freq: Every day | ORAL | 1 refills | Status: DC

## 2016-10-21 MED ORDER — DULOXETINE HCL 60 MG PO CPEP: 60.0000 mg | ORAL_CAPSULE | Freq: Every day | ORAL | 1 refills | Status: DC

## 2016-10-21 NOTE — Progress Notes (Signed)
BH MD/PA/NP OP Progress Note  10/21/2016 9:24 AM [name]  MRN:  [id number]  Subjective:  [name] is a 51 year old Caucasian female who presented for the follow-up appointment. She reported that she is started to feel depressed due to financial problems at home. She reported that her husband was doing over time but now they are trying to cut down the overtime he has been working. They are having financial difficulties. She is also looking for some work. She reported that she wants to stop taking the Vyvanse. She is tried to stop on her own and was having withdrawal symptoms. She is willing to cut down the dose at this time. She reported that she wants to go higher on the dose of Cymbalta. We discussed about the medications. She also wants to increase the dose of BuSpar. She has been sleeping well at night. She does not have any suicidal ideations or plans. She is compliant with her medications. She denied having any perceptual disturbances at this time.  .     Chief Complaint:  Chief Complaint    Follow-up     Visit Diagnosis:     ICD-10-CM   1. MDD (major depressive disorder), recurrent episode, moderate (HCC) F33.1   2. Binge eating disorder F50.81     Past Medical History:  Past Medical History:  Diagnosis Date  . ADD (attention deficit disorder)   . Asthma   . Back pain   . Breast abscess 1993   right  . Breast screening   . Depression   . Headache    sinus  . Hypertension    diet controlled  . Lump or mass in breast    left  . Obesity, unspecified   . PONV (postoperative nausea and vomiting)   . Screening for obesity   . Tobacco use     Past Surgical History:  Procedure Laterality Date  . ABDOMINAL HYSTERECTOMY  2001  . BREAST BIOPSY Left 2013  . BREAST SURGERY Left 2014   excision of fibroadenoma.  . [place]  . CHOLECYSTECTOMY  1986  . ETHMOIDECTOMY Right 03/28/2015   Procedure: RIGHT ANTERIOR ETHMOIDECTOMY;  Surgeon: Clyde Canterbury,  MD;  Location: [place];  Service: ENT;  Laterality: Right;  . IMAGE GUIDED SINUS SURGERY N/A 03/28/2015   Procedure: IMAGE GUIDED SINUS SURGERY;  Surgeon: Clyde Canterbury, MD;  Location: [place];  Service: ENT;  Laterality: N/A;  STRYKER gave disk to cece  . MAXILLARY ANTROSTOMY Right 03/28/2015   Procedure: MAXILLARY ANTROSTOMY;  Surgeon: Clyde Canterbury, MD;  Location: [place];  Service: ENT;  Laterality: Right;   Family History:  Family History  Problem Relation Age of Onset  . Other Other 65       Colon Cancer, Skin Cancer  . Other Maternal Uncle 70       Prostate Cancer  . Diabetes Mother   . Heart disease Mother   . Parkinson's disease Father   . Colon cancer Father   . Hypotension Father   . Alcohol abuse Father   . Seizures Sister   . Depression Sister   . Depression Sister    Social History:  Social History   Social History  . Marital status: Married    Spouse name: N/A  . Number of children: N/A  . Years of education: N/A   Social History Main Topics  . Smoking status: Former Smoker    Packs/day: 1.00    Types: Cigarettes  Quit date: 09/27/1994  . Smokeless tobacco: Never Used  . Alcohol use No     Comment: 1x/6 mos  . Drug use: No  . Sexual activity: Yes    Partners: Male    Birth control/ protection: None   Other Topics Concern  . None   Social History Narrative  . None   Additional History: History of major depressive disorder for 30 years Wellbutrin- hallucination  cymbalta- did fine   Assessment:   Musculoskeletal: Strength & Muscle Tone: within normal limits Gait & Station: normal Patient leans: N/A  Psychiatric Specialty Exam: Medication Refill     ROS  Blood pressure 110/74, pulse 89, height 5' 6.5" (1.689 m), weight 264 lb (119.7 kg), SpO2 96 %.Body mass index is 41.97 kg/m.  General Appearance: Disheveled  Eye Contact:  Fair  Speech:  Clear and Coherent  Volume:  Normal  Mood:  Anxious   Affect:  Appropriate and Tearful  Thought Process:  Coherent  Orientation:  Full (Time, Place, and Person)  Thought Content:  WDL  Suicidal Thoughts:  No  Homicidal Thoughts:  No  Memory:  Immediate;   Fair Recent;   Fair Remote;   Fair  Judgement:  Fair  Insight:  Present  Psychomotor Activity:  Normal  Concentration:  Poor  Recall:  [employer] of Knowledge: Fair  Language: Fair  Akathisia:  No  Handed:  Right  AIMS (if indicated):    Assets:  Communication Skills Desire for Improvement Housing Social Support  ADL's:  Intact  Cognition: WNL  Sleep:  okay   Is the patient at risk to self?  No. Has the patient been a risk to self in the past 6 months?  Yes.   Has the patient been a risk to self within the distant past?  Yes.   Is the patient a risk to others?  No. Has the patient been a risk to others in the past 6 months?  No. Has the patient been a risk to others within the distant past?  No.  Current Medications: Current Outpatient Prescriptions  Medication Sig Dispense Refill  . ARIPiprazole (ABILIFY) 10 MG tablet Take 1 tablet (10 mg total) by mouth daily. 30 tablet 1  . busPIRone (BUSPAR) 10 MG tablet Take 1 tablet (10 mg total) by mouth 2 (two) times daily. 60 tablet 1  . DULoxetine (CYMBALTA) 60 MG capsule Take 1 capsule (60 mg total) by mouth daily. 30 capsule 1  . lisdexamfetamine (VYVANSE) 10 MG capsule Take 1 capsule (10 mg total) by mouth daily. 30 capsule 0   No current facility-administered medications for this visit.     Medical Decision Making:  Established Problem, Worsening (2), Review of Last Therapy Session (1), Review of Medication Regimen & Side Effects (2) and Review of New Medication or Change in Dosage (2)  Treatment Plan Summary:Medication management  Major depressive disorder:   Discussed with patient what the medications  Increase Cymbalta 60  mg daily. Continue  Abilify 10  mg daily Change Vyvanse 10 mg by mouth daily. She will  decrease the dose gradually and will stop the medication over 1 month. She agreed with the plan. Increase BuSpar 10 mg by mouth twice a day  Follow-up in 1 months or earlier depending on her symptoms  She agreed with the plan.   More than 50% of the time spent in psychoeducation, counseling and coordination of care.    This note was generated in part or whole with voice  recognition software. Voice regonition is usually quite accurate but there are transcription errors that can and very often do occur. I apologize for any typographical errors that were not detected and corrected.      More than 50% of the time spent in psychoeducation, counseling and coordination of care.    This note was generated in part or whole with voice recognition software. Voice regonition is usually quite accurate but there are transcription errors that can and very often do occur. I apologize for any typographical errors that were not detected and corrected.   Rainey Pines, MD 10/21/2016, 9:24 AM

## 2016-10-29 ENCOUNTER — Ambulatory Visit: Payer: BLUE CROSS/BLUE SHIELD | Admitting: Psychiatry

## 2016-11-11 ENCOUNTER — Ambulatory Visit (INDEPENDENT_AMBULATORY_CARE_PROVIDER_SITE_OTHER): Payer: BLUE CROSS/BLUE SHIELD | Admitting: Psychiatry

## 2016-11-11 ENCOUNTER — Encounter: Payer: Self-pay | Admitting: Psychiatry

## 2016-11-11 VITALS — BP 136/95 | HR 112 | Temp 98.1°F | Wt 268.4 lb

## 2016-11-11 DIAGNOSIS — F331 Major depressive disorder, recurrent, moderate: Secondary | ICD-10-CM

## 2016-11-11 DIAGNOSIS — F5081 Binge eating disorder: Secondary | ICD-10-CM

## 2016-11-11 MED ORDER — DULOXETINE HCL 60 MG PO CPEP: 60.0000 mg | ORAL_CAPSULE | Freq: Every day | ORAL | 1 refills | Status: DC

## 2016-11-11 MED ORDER — BUSPIRONE HCL 10 MG PO TABS: 10.0000 mg | ORAL_TABLET | Freq: Three times a day (TID) | ORAL | 1 refills | Status: DC

## 2016-11-11 MED ORDER — ARIPIPRAZOLE 10 MG PO TABS: 10.0000 mg | ORAL_TABLET | Freq: Every day | ORAL | 1 refills | Status: DC

## 2016-11-11 NOTE — Progress Notes (Signed)
BH MD/PA/NP OP Progress Note  11/11/2016 4:01 PM [name]  MRN:  [id number]  Subjective:  [name] is a 51 year old Caucasian female who presented for the follow-up appointment. She reported that she continues to have withdrawal symptoms related to the Vyvanse. We decrease the dose to 10 mg at last appointment and patient was advised to take on alternate days. She reported that the day when she is not taking the medication she continues to feel anxious. Patient reported that she is willing to stop the medication completely. Patient reported that she has been applying for a job and is waiting for the phone calls. She stated that her mood symptoms are stable with the help of Cymbalta and Abilify. She denied having any suicidal ideations or plans. She is discussing in detail about her medications. She stated that the Vyvanse is making her feel worse and she will get better once the medication completely stopped and she is going to stop the medication today. She denied having any perceptual disturbances. She is open to suggestions about her medications.   .     Chief Complaint:  Chief Complaint    Follow-up; Medication Refill     Visit Diagnosis:   No diagnosis found.  Past Medical History:  Past Medical History:  Diagnosis Date  . ADD (attention deficit disorder)   . Asthma   . Back pain   . Breast abscess 1993   right  . Breast screening   . Depression   . Headache    sinus  . Hypertension    diet controlled  . Lump or mass in breast    left  . Obesity, unspecified   . PONV (postoperative nausea and vomiting)   . Screening for obesity   . Tobacco use     Past Surgical History:  Procedure Laterality Date  . ABDOMINAL HYSTERECTOMY  2001  . BREAST BIOPSY Left 2013  . BREAST SURGERY Left 2014   excision of fibroadenoma.  . [place]  . CHOLECYSTECTOMY  1986  . ETHMOIDECTOMY Right 03/28/2015   Procedure: RIGHT ANTERIOR ETHMOIDECTOMY;  Surgeon: Clyde Canterbury, MD;  Location: [facility];  Service: ENT;  Laterality: Right;  . IMAGE GUIDED SINUS SURGERY N/A 03/28/2015   Procedure: IMAGE GUIDED SINUS SURGERY;  Surgeon: Clyde Canterbury, MD;  Location: [place] [facility];  Service: ENT;  Laterality: N/A;  STRYKER gave disk to cece  . MAXILLARY ANTROSTOMY Right 03/28/2015   Procedure: MAXILLARY ANTROSTOMY;  Surgeon: Clyde Canterbury, MD;  Location: [facility];  Service: ENT;  Laterality: Right;   Family History:  Family History  Problem Relation Age of Onset  . Other Other 65       Colon Cancer, Skin Cancer  . Other Maternal Uncle 70       Prostate Cancer  . Diabetes Mother   . Heart disease Mother   . Parkinson's disease Father   . Colon cancer Father   . Hypotension Father   . Alcohol abuse Father   . Seizures Sister   . Depression Sister   . Depression Sister    Social History:  Social History   Social History  . Marital status: Married    Spouse name: N/A  . Number of children: N/A  . Years of education: N/A   Social History Main Topics  . Smoking status: Former Smoker    Packs/day: 1.00    Types: Cigarettes    Quit date: 09/27/1994  . Smokeless tobacco:  Never Used  . Alcohol use No     Comment: 1x/6 mos  . Drug use: No  . Sexual activity: Yes    Partners: Male    Birth control/ protection: None   Other Topics Concern  . Not on file   Social History Narrative  . No narrative on file   Additional History: History of major depressive disorder for 30 years Wellbutrin- hallucination  cymbalta- did fine   Assessment:   Musculoskeletal: Strength & Muscle Tone: within normal limits Gait & Station: normal Patient leans: N/A  Psychiatric Specialty Exam: Medication Refill     ROS  There were no vitals taken for this visit.There is no height or weight on file to calculate BMI.  General Appearance: Disheveled  Eye Contact:  Fair  Speech:  Clear and Coherent  Volume:  Normal  Mood:  Anxious   Affect:  Appropriate and Tearful  Thought Process:  Coherent  Orientation:  Full (Time, Place, and Person)  Thought Content:  WDL  Suicidal Thoughts:  No  Homicidal Thoughts:  No  Memory:  Immediate;   Fair Recent;   Fair Remote;   Fair  Judgement:  Fair  Insight:  Present  Psychomotor Activity:  Normal  Concentration:  Poor  Recall:  [employer] of Knowledge: Fair  Language: Fair  Akathisia:  No  Handed:  Right  AIMS (if indicated):    Assets:  Communication Skills Desire for Improvement Housing Social Support  ADL's:  Intact  Cognition: WNL  Sleep:  okay   Is the patient at risk to self?  No. Has the patient been a risk to self in the past 6 months?  Yes.   Has the patient been a risk to self within the distant past?  Yes.   Is the patient a risk to others?  No. Has the patient been a risk to others in the past 6 months?  No. Has the patient been a risk to others within the distant past?  No.  Current Medications: Current Outpatient Prescriptions  Medication Sig Dispense Refill  . ARIPiprazole (ABILIFY) 10 MG tablet Take 1 tablet (10 mg total) by mouth daily. 30 tablet 1  . busPIRone (BUSPAR) 10 MG tablet Take 1 tablet (10 mg total) by mouth 2 (two) times daily. 60 tablet 1  . DULoxetine (CYMBALTA) 60 MG capsule Take 1 capsule (60 mg total) by mouth daily. 30 capsule 1  . lisdexamfetamine (VYVANSE) 10 MG capsule Take 1 capsule (10 mg total) by mouth daily. 30 capsule 0   No current facility-administered medications for this visit.     Medical Decision Making:  Established Problem, Worsening (2), Review of Last Therapy Session (1), Review of Medication Regimen & Side Effects (2) and Review of New Medication or Change in Dosage (2)  Treatment Plan Summary:Medication management  Major depressive disorder:   Discussed with patient what the medications Cymbalta 60  mg daily. Continue  Abilify 10  mg daily D/c vyvanse  Increase BuSpar 10 mg by mouth 3 times a day to  help with the anxiety and she agreed with the plan. Follow-up in 2 months or earlier depending on her symptoms  She agreed with the plan.   More than 50% of the time spent in psychoeducation, counseling and coordination of care.    This note was generated in part or whole with voice recognition software. Voice regonition is usually quite accurate but there are transcription errors that can and very often do occur. I  apologize for any typographical errors that were not detected and corrected.      More than 50% of the time spent in psychoeducation, counseling and coordination of care.    This note was generated in part or whole with voice recognition software. Voice regonition is usually quite accurate but there are transcription errors that can and very often do occur. I apologize for any typographical errors that were not detected and corrected.   Rainey Pines, MD 11/11/2016, 4:01 PM

## 2016-12-25 ENCOUNTER — Ambulatory Visit
Admission: RE | Admit: 2016-12-25 | Discharge: 2016-12-25 | Payer: BC Managed Care – PPO | Attending: Family Medicine | Admitting: Family Medicine

## 2016-12-25 DIAGNOSIS — Z23 Encounter for immunization: Principal | ICD-10-CM

## 2016-12-25 DIAGNOSIS — I10 Essential (primary) hypertension: Secondary | ICD-10-CM

## 2016-12-25 DIAGNOSIS — F332 Major depressive disorder, recurrent severe without psychotic features: Secondary | ICD-10-CM

## 2016-12-25 DIAGNOSIS — N63 Unspecified lump in unspecified breast: Secondary | ICD-10-CM

## 2017-01-07 ENCOUNTER — Ambulatory Visit: Admit: 2017-01-07 | Discharge: 2017-01-07 | Disposition: A | Discharge status: Home / Self Care

## 2017-01-07 ENCOUNTER — Ambulatory Visit
Admit: 2017-01-07 | Discharge: 2017-01-07 | Disposition: A | Discharge status: Home / Self Care | Payer: BC Managed Care – PPO

## 2017-01-08 ENCOUNTER — Ambulatory Visit
Admission: RE | Admit: 2017-01-08 | Discharge: 2017-01-08 | Disposition: A | Discharge status: Home / Self Care | Payer: BC Managed Care – PPO

## 2017-01-08 DIAGNOSIS — N63 Unspecified lump in unspecified breast: Principal | ICD-10-CM

## 2017-01-13 ENCOUNTER — Encounter: Payer: Self-pay | Admitting: Psychiatry

## 2017-01-13 ENCOUNTER — Ambulatory Visit: Payer: BLUE CROSS/BLUE SHIELD | Admitting: Psychiatry

## 2017-01-13 VITALS — BP 128/87 | HR 94 | Ht 65.0 in | Wt 274.0 lb

## 2017-01-13 DIAGNOSIS — F50819 Binge eating disorder, unspecified: Secondary | ICD-10-CM

## 2017-01-13 DIAGNOSIS — F5081 Binge eating disorder: Secondary | ICD-10-CM

## 2017-01-13 DIAGNOSIS — F331 Major depressive disorder, recurrent, moderate: Secondary | ICD-10-CM | POA: Diagnosis not present

## 2017-01-13 MED ORDER — BUSPIRONE HCL 10 MG PO TABS: 10.0000 mg | ORAL_TABLET | Freq: Two times a day (BID) | ORAL | 1 refills | Status: DC

## 2017-01-13 MED ORDER — ARIPIPRAZOLE 10 MG PO TABS: 5.0000 mg | ORAL_TABLET | Freq: Every day | ORAL | 1 refills | Status: DC

## 2017-01-13 MED ORDER — LITHIUM CARBONATE ER 300 MG PO TBCR: 300.0000 mg | EXTENDED_RELEASE_TABLET | Freq: Every day | ORAL | 1 refills | Status: DC

## 2017-01-13 MED ORDER — DULOXETINE HCL 60 MG PO CPEP: 60.0000 mg | ORAL_CAPSULE | Freq: Every day | ORAL | 1 refills | Status: AC

## 2017-01-13 NOTE — Progress Notes (Signed)
[facility] MD/PA/NP OP Progress Note  01/13/2017 10:31 AM [name]  MRN:  [id number]  Subjective:  Ms. [name] is a 51 year old Caucasian female who presented for the follow-up appointment. She reported that she has been feeling depressed and is sleeping sleeping for at least 14-15 hours per day. She reported that she is not improving on her medications. She reported that she is going to have her insurance changed from next year and wants to have her medications adjusted. She reported that she will not be able to afford Abilify from next year. We discussed about her medications. She willing to start lithium at this time. She has tried lithium in the past. She reported that she has been trying to walk with her husband during the daytime. She denied having any suicidal homicidal ideations or plans. She is compliant with her medications but is taking BuSpar only on the when necessary basis. She reported that the depressive symptoms are getting worse at this time. She is not using any drugs or alcohol at this time. She is also gaining weight.      .     Chief Complaint:   Visit Diagnosis:     ICD-10-CM   1. MDD (major depressive disorder), recurrent episode, moderate (HCC) F33.1   2. Binge eating disorder F50.81     Past Medical History:  Past Medical History:  Diagnosis Date  . ADD (attention deficit disorder)   . Asthma   . Back pain   . Breast abscess 1993   right  . Breast screening   . Depression   . Headache    sinus  . Hypertension    diet controlled  . Lump or mass in breast    left  . Obesity, unspecified   . PONV (postoperative nausea and vomiting)   . Screening for obesity   . Tobacco use     Past Surgical History:  Procedure Laterality Date  . ABDOMINAL HYSTERECTOMY  2001  . BREAST BIOPSY Left 2013  . BREAST SURGERY Left 2014   excision of fibroadenoma.  . [place]  . CHOLECYSTECTOMY  1986   Family History:  Family History  Problem  Relation Age of Onset  . Other Other 65       Colon Cancer, Skin Cancer  . Other Maternal Uncle 70       Prostate Cancer  . Diabetes Mother   . Heart disease Mother   . Parkinson's disease Father   . Colon cancer Father   . Hypotension Father   . Alcohol abuse Father   . Seizures Sister   . Depression Sister   . Depression Sister    Social History:  Social History   Socioeconomic History  . Marital status: Married    Spouse name: None  . Number of children: None  . Years of education: None  . Highest education level: None  Social Needs  . Financial resource strain: None  . Food insecurity - worry: None  . Food insecurity - inability: None  . Transportation needs - medical: None  . Transportation needs - non-medical: None  Occupational History  . None  Tobacco Use  . Smoking status: Former Smoker    Packs/day: 1.00    Types: Cigarettes    Last attempt to quit: 09/27/1994    Years since quitting: 22.3  . Smokeless tobacco: Never Used  Substance and Sexual Activity  . Alcohol use: No    Alcohol/week: 0.0 oz  Comment: 1x/6 mos  . Drug use: No  . Sexual activity: Yes    Partners: Male    Birth control/protection: None  Other Topics Concern  . None  Social History Narrative  . None   Additional History: History of major depressive disorder for 30 years Wellbutrin- hallucination  cymbalta- did fine   Assessment:   Musculoskeletal: Strength & Muscle Tone: within normal limits Gait & Station: normal Patient leans: N/A  Psychiatric Specialty Exam: Medication Refill     ROS  Blood pressure 128/87, pulse 94, height 5\' 5"  (1.651 m), weight 274 lb (124.3 kg).Body mass index is 45.6 kg/m.  General Appearance: Disheveled  Eye Contact:  Fair  Speech:  Clear and Coherent  Volume:  Normal  Mood:  Anxious  Affect:  Appropriate and Tearful  Thought Process:  Coherent  Orientation:  Full (Time, Place, and Person)  Thought Content:  WDL  Suicidal Thoughts:   No  Homicidal Thoughts:  No  Memory:  Immediate;   Fair Recent;   Fair Remote;   Fair  Judgement:  Fair  Insight:  Present  Psychomotor Activity:  Normal  Concentration:  Poor  Recall:  [employer] of Knowledge: Fair  Language: Fair  Akathisia:  No  Handed:  Right  AIMS (if indicated):    Assets:  Communication Skills Desire for Improvement Housing Social Support  ADL's:  Intact  Cognition: WNL  Sleep:  okay   Is the patient at risk to self?  No. Has the patient been a risk to self in the past 6 months?  Yes.   Has the patient been a risk to self within the distant past?  Yes.   Is the patient a risk to others?  No. Has the patient been a risk to others in the past 6 months?  No. Has the patient been a risk to others within the distant past?  No.  Current Medications: Current Outpatient Medications  Medication Sig Dispense Refill  . ARIPiprazole (ABILIFY) 10 MG tablet Take 0.5 tablets (5 mg total) daily by mouth. Take on M, W, F- pt has supply 90 tablet 1  . busPIRone (BUSPAR) 10 MG tablet Take 1 tablet (10 mg total) 2 (two) times daily by mouth. 180 tablet 1  . DULoxetine (CYMBALTA) 60 MG capsule Take 1 capsule (60 mg total) daily by mouth. 90 capsule 1  . SUMAtriptan (IMITREX) 50 MG tablet Take one tablet PRN for migraine. If no improvement, can take a second pill in 2 hrs. No more than 2 pills in one week.    . chlorthalidone (HYGROTON) 25 MG tablet     . lithium carbonate (LITHOBID) 300 MG CR tablet Take 1 tablet (300 mg total) at bedtime by mouth. 30 tablet 1   No current facility-administered medications for this visit.     Medical Decision Making:  Established Problem, Worsening (2), Review of Last Therapy Session (1), Review of Medication Regimen & Side Effects (2) and Review of New Medication or Change in Dosage (2)  Treatment Plan Summary:Medication management  Major depressive disorder:   Discussed with patient about the medications Cymbalta 60  mg  daily. Change Abilify 5 mg on alternate days and she will run out of the medication in almost one month.   start lithium 300 mg by mouth daily at bedtime.  change BuSpar 10 mg by mouth twice a day  Follow-up in 1 month       More than 50% of the time spent in  psychoeducation, counseling and coordination of care.    This note was generated in part or whole with voice recognition software. Voice regonition is usually quite accurate but there are transcription errors that can and very often do occur. I apologize for any typographical errors that were not detected and corrected.   Rainey Pines, MD 01/13/2017, 10:31 AM

## 2017-02-10 ENCOUNTER — Ambulatory Visit: Payer: BLUE CROSS/BLUE SHIELD | Admitting: Psychiatry

## 2017-03-12 ENCOUNTER — Other Ambulatory Visit: Payer: Self-pay | Admitting: Psychiatry

## 2017-03-20 ENCOUNTER — Other Ambulatory Visit: Payer: Self-pay | Admitting: Psychiatry

## 2017-03-20 MED ORDER — LITHIUM CARBONATE ER 300 MG PO TBCR: 300.0000 mg | EXTENDED_RELEASE_TABLET | Freq: Every day | ORAL | 1 refills | Status: DC

## 2017-03-20 NOTE — Telephone Encounter (Signed)
pt called states that she needs refills on her lithium she will be out today.  and her next appt is not until the 28th    lithium carbonate (LITHOBID) 300 MG CR tablet  Medication  Date: 01/13/2017 Department: [facility] Ordering/Authorizing: Rainey Pines, MD  Order Providers   Prescribing Provider Encounter Provider  Rainey Pines, MD Rainey Pines, MD  Medication Detail    Disp Refills Start End   lithium carbonate (LITHOBID) 300 MG CR tablet 30 tablet 1 01/13/2017    Sig - Route: Take 1 tablet (300 mg total) at bedtime by mouth. - Oral   Sent to pharmacy as: lithium carbonate (LITHOBID) 300 MG CR tablet   E-Prescribing Status: Receipt confirmed by pharmacy (01/13/2017 10:11 AM EST)

## 2017-03-20 NOTE — Telephone Encounter (Signed)
refilled 

## 2017-03-31 ENCOUNTER — Other Ambulatory Visit: Payer: Self-pay

## 2017-03-31 ENCOUNTER — Ambulatory Visit (INDEPENDENT_AMBULATORY_CARE_PROVIDER_SITE_OTHER): Payer: BLUE CROSS/BLUE SHIELD | Admitting: Psychiatry

## 2017-03-31 ENCOUNTER — Encounter: Payer: Self-pay | Admitting: Psychiatry

## 2017-03-31 VITALS — BP 128/89 | HR 99 | Temp 97.8°F | Wt 274.4 lb

## 2017-03-31 DIAGNOSIS — F331 Major depressive disorder, recurrent, moderate: Secondary | ICD-10-CM | POA: Diagnosis not present

## 2017-03-31 DIAGNOSIS — F5081 Binge eating disorder: Secondary | ICD-10-CM

## 2017-03-31 MED ORDER — BUSPIRONE HCL 10 MG PO TABS: 10.0000 mg | ORAL_TABLET | Freq: Every day | ORAL | 1 refills | Status: DC

## 2017-03-31 MED ORDER — ARIPIPRAZOLE 5 MG PO TABS: 2.5000 mg | ORAL_TABLET | Freq: Every day | ORAL | Status: AC

## 2017-03-31 MED ORDER — LITHIUM CARBONATE ER 300 MG PO TBCR: 300.0000 mg | EXTENDED_RELEASE_TABLET | Freq: Every day | ORAL | 2 refills | Status: DC

## 2017-03-31 NOTE — Progress Notes (Signed)
BH MD/PA/NP OP Progress Note  03/31/2017 10:57 AM [name]  MRN:  [id number]  Subjective:  Ms. [name] is a 52 year old Caucasian female who presented for the follow-up appointment. She reported that she has been doing well and has been compliant with her medications. She reported that the lithium is helping her and she has noticed mild tremors occasionally. However they are not bothering her. She reported that she is taking Abilify 2.5 mg at this time since she received mail order medication. She is tapering down on the Abilify and we discussed about taking it on alternate days and she agreed with the plan. She reported that she has also found job working 10-11 hours on a weekly basis and she is excited about the same. Patient reported that the job is keeping her motivated and is helping with her depressive symptoms. Patient appeared calm and alert during the interview. She denied having any suicidal ideations or plans. She denied having any perceptual disturbances.   Patient reported that she drinks Gatorade during the daytime as she has been taking her blood pressure medication at night. Advised her to take her blood pressure medication and Cymbalta in the morning to help her sleep at night and she agreed with the plan..      .     Chief Complaint:  Chief Complaint    Follow-up; Medication Refill     Visit Diagnosis:     ICD-10-CM   1. MDD (major depressive disorder), recurrent episode, moderate (HCC) F33.1   2. Binge eating disorder F50.81     Past Medical History:  Past Medical History:  Diagnosis Date  . ADD (attention deficit disorder)   . Asthma   . Back pain   . Breast abscess 1993   right  . Breast screening   . Depression   . Headache    sinus  . Hypertension    diet controlled  . Lump or mass in breast    left  . Obesity, unspecified   . PONV (postoperative nausea and vomiting)   . Screening for obesity   . Tobacco use     Past Surgical History:   Procedure Laterality Date  . ABDOMINAL HYSTERECTOMY  2001  . BREAST BIOPSY Left 2013  . BREAST SURGERY Left 2014   excision of fibroadenoma.  . [place]  . CHOLECYSTECTOMY  1986  . ETHMOIDECTOMY Right 03/28/2015   Procedure: RIGHT ANTERIOR ETHMOIDECTOMY;  Surgeon: Clyde Canterbury, MD;  Location: [facility];  Service: ENT;  Laterality: Right;  . IMAGE GUIDED SINUS SURGERY N/A 03/28/2015   Procedure: IMAGE GUIDED SINUS SURGERY;  Surgeon: Clyde Canterbury, MD;  Location: [place];  Service: ENT;  Laterality: N/A;  STRYKER gave disk to cece  . MAXILLARY ANTROSTOMY Right 03/28/2015   Procedure: MAXILLARY ANTROSTOMY;  Surgeon: Clyde Canterbury, MD;  Location: [place];  Service: ENT;  Laterality: Right;   Family History:  Family History  Problem Relation Age of Onset  . Other Other 65       Colon Cancer, Skin Cancer  . Other Maternal Uncle 70       Prostate Cancer  . Diabetes Mother   . Heart disease Mother   . Parkinson's disease Father   . Colon cancer Father   . Hypotension Father   . Alcohol abuse Father   . Seizures Sister   . Depression Sister   . Depression Sister    Social History:  Social History  Socioeconomic History  . Marital status: Married    Spouse name: None  . Number of children: None  . Years of education: None  . Highest education level: None  Social Needs  . Financial resource strain: None  . Food insecurity - worry: None  . Food insecurity - inability: None  . Transportation needs - medical: None  . Transportation needs - non-medical: None  Occupational History  . None  Tobacco Use  . Smoking status: Former Smoker    Packs/day: 1.00    Types: Cigarettes    Last attempt to quit: 09/27/1994    Years since quitting: 22.5  . Smokeless tobacco: Never Used  Substance and Sexual Activity  . Alcohol use: No    Alcohol/week: 0.0 oz    Comment: 1x/6 mos  . Drug use: No  . Sexual activity: Yes    Partners: Male     Birth control/protection: None  Other Topics Concern  . None  Social History Narrative  . None   Additional History: History of major depressive disorder for 30 years Wellbutrin- hallucination  cymbalta- did fine   Assessment:   Musculoskeletal: Strength & Muscle Tone: within normal limits Gait & Station: normal Patient leans: N/A  Psychiatric Specialty Exam: Medication Refill     ROS  Blood pressure 128/89, pulse 99, temperature 97.8 F (36.6 C), temperature source Oral, weight 274 lb 6.4 oz (124.5 kg).Body mass index is 45.66 kg/m.  General Appearance: Disheveled  Eye Contact:  Fair  Speech:  Clear and Coherent  Volume:  Normal  Mood:  Anxious  Affect:  Appropriate and Tearful  Thought Process:  Coherent  Orientation:  Full (Time, Place, and Person)  Thought Content:  WDL  Suicidal Thoughts:  No  Homicidal Thoughts:  No  Memory:  Immediate;   Fair Recent;   Fair Remote;   Fair  Judgement:  Fair  Insight:  Present  Psychomotor Activity:  Normal  Concentration:  Poor  Recall:  [employer] of Knowledge: Fair  Language: Fair  Akathisia:  No  Handed:  Right  AIMS (if indicated):    Assets:  Communication Skills Desire for Improvement Housing Social Support  ADL's:  Intact  Cognition: WNL  Sleep:  okay   Is the patient at risk to self?  No. Has the patient been a risk to self in the past 6 months?  Yes.   Has the patient been a risk to self within the distant past?  Yes.   Is the patient a risk to others?  No. Has the patient been a risk to others in the past 6 months?  No. Has the patient been a risk to others within the distant past?  No.  Current Medications: Current Outpatient Medications  Medication Sig Dispense Refill  . ARIPiprazole (ABILIFY) 5 MG tablet Take 0.5 tablets (2.5 mg total) by mouth daily. Take on M, W, F- pt has supply Will end in 40 days    . busPIRone (BUSPAR) 10 MG tablet Take 1 tablet (10 mg total) by mouth at bedtime. 90  tablet 1  . chlorthalidone (HYGROTON) 25 MG tablet     . DULoxetine (CYMBALTA) 60 MG capsule Take 1 capsule (60 mg total) daily by mouth. 90 capsule 1  . lithium carbonate (LITHOBID) 300 MG CR tablet Take 1 tablet (300 mg total) by mouth at bedtime. 30 tablet 2  . SUMAtriptan (IMITREX) 50 MG tablet Take one tablet PRN for migraine. If no improvement, can take a second  pill in 2 hrs. No more than 2 pills in one week.     No current facility-administered medications for this visit.     Medical Decision Making:  Established Problem, Worsening (2), Review of Last Therapy Session (1), Review of Medication Regimen & Side Effects (2) and Review of New Medication or Change in Dosage (2)  Treatment Plan Summary:Medication management  Major depressive disorder:   Discussed with patient about the medications Cymbalta 60  mg daily. Change Abilify 2.5 mg on alternate days and she will run out of the medication in almost one month.   start lithium 300 mg by mouth daily at bedtime.  change BuSpar 10 mg by mouth twice a day  Follow-up in 2 month       More than 50% of the time spent in psychoeducation, counseling and coordination of care.    This note was generated in part or whole with voice recognition software. Voice regonition is usually quite accurate but there are transcription errors that can and very often do occur. I apologize for any typographical errors that were not detected and corrected.   Rainey Pines, MD 03/31/2017, 10:57 AM

## 2017-05-05 ENCOUNTER — Encounter
Admit: 2017-05-05 | Discharge: 2017-05-06 | Payer: PRIVATE HEALTH INSURANCE | Attending: Family Medicine | Primary: Family Medicine

## 2017-05-05 DIAGNOSIS — I10 Essential (primary) hypertension: Secondary | ICD-10-CM

## 2017-05-05 DIAGNOSIS — H6982 Other specified disorders of Eustachian tube, left ear: Secondary | ICD-10-CM

## 2017-05-05 DIAGNOSIS — Z1211 Encounter for screening for malignant neoplasm of colon: Principal | ICD-10-CM

## 2017-05-05 DIAGNOSIS — F332 Major depressive disorder, recurrent severe without psychotic features: Secondary | ICD-10-CM

## 2017-05-05 DIAGNOSIS — Z1331 Encounter for screening for depression: Secondary | ICD-10-CM

## 2017-05-05 MED ORDER — CHLORTHALIDONE 25 MG TABLET
ORAL_TABLET | Freq: Every morning | ORAL | 3 refills | 0.00000 days | Status: CP
Start: 2017-05-05 — End: 2017-10-31

## 2017-05-26 ENCOUNTER — Ambulatory Visit: Payer: BLUE CROSS/BLUE SHIELD | Admitting: Psychiatry

## 2017-05-31 IMAGING — CT CT RENAL STONE PROTOCOL
1 of 2 series · 14 of 32 positions shown, 18 images · non-contrast
Comparison: CT of the abdomen and pelvis 09/04/2012.

CLINICAL DATA: 49-year-old female with recent history of bilateral
flank pain and hematuria. Depression.

EXAM:
CT ABDOMEN AND PELVIS WITHOUT CONTRAST
TECHNIQUE: Multidetector CT imaging of the abdomen and pelvis was performed
following the standard protocol without IV contrast.

[Series 2: stone standard full · axial · 0.98mm/px · z∈[-599,-139]mm · 14 of 102 slices shown, 18 images]
[im 5/102  soft-tissue]
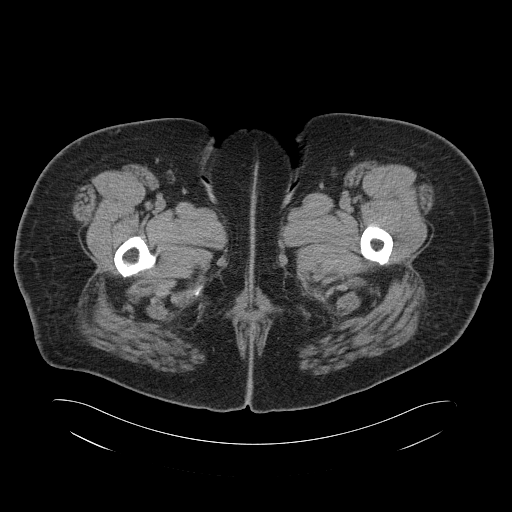
[im 5/102  bone]
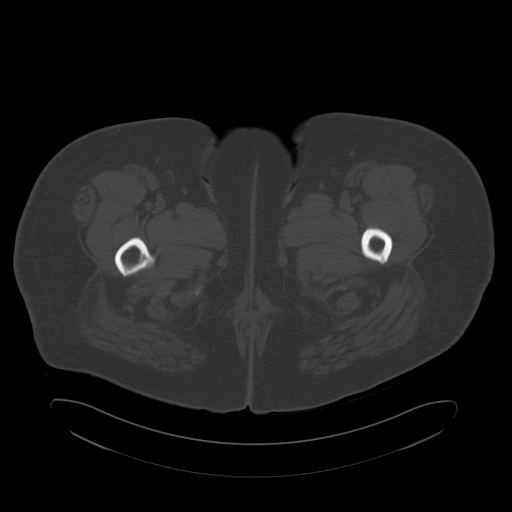
[im 14/102  soft-tissue]
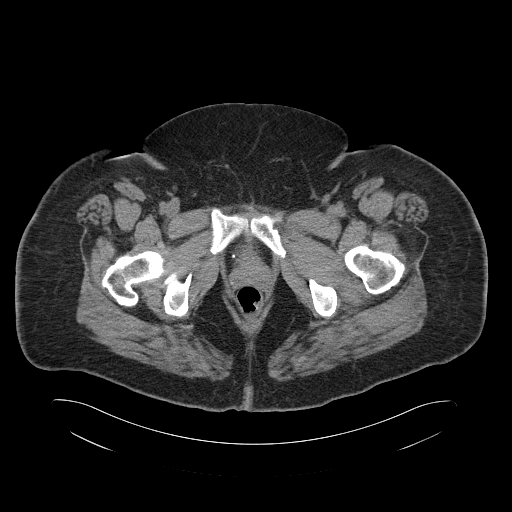
[im 22/102  soft-tissue]
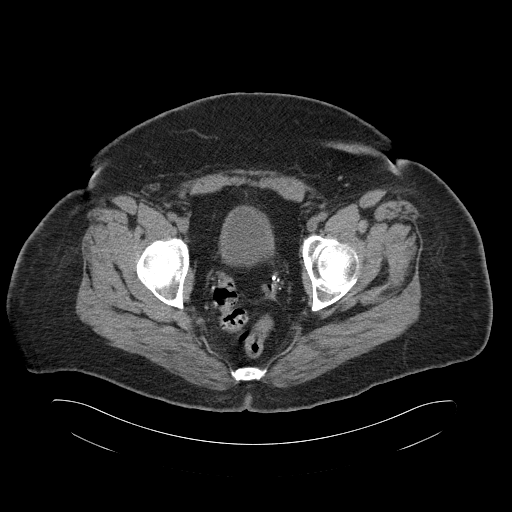
[im 31/102  soft-tissue]
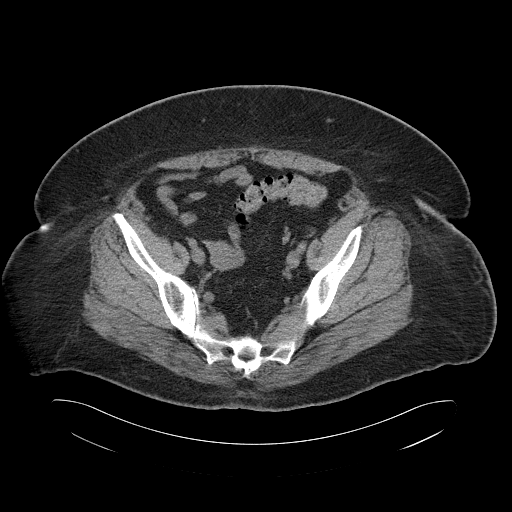
[im 40/102  soft-tissue]
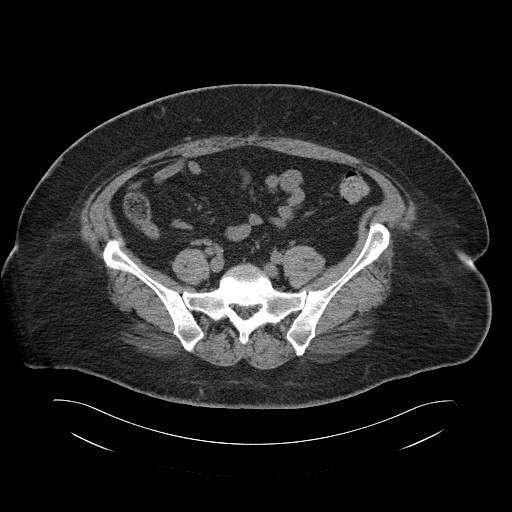
[im 49/102  soft-tissue]
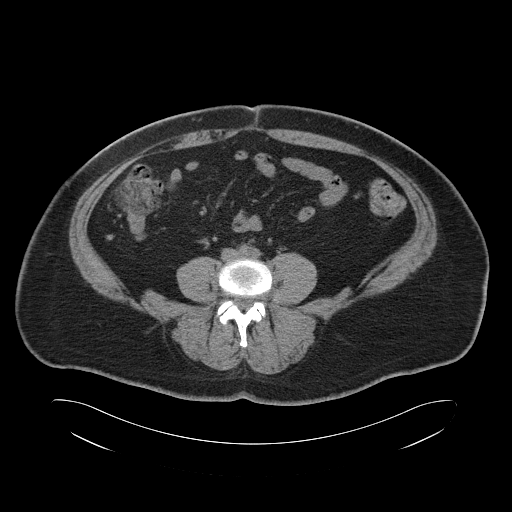
[im 53/102  soft-tissue]
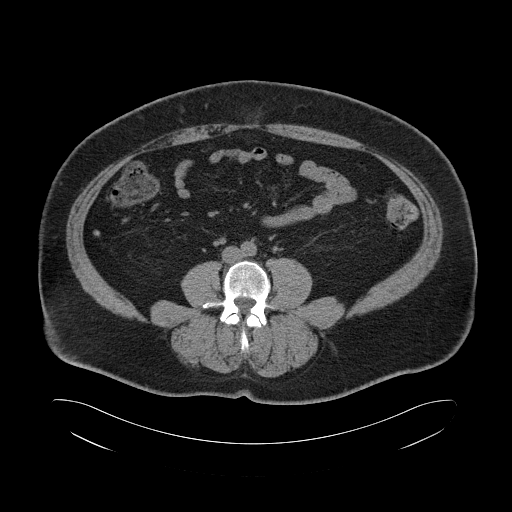
[im 62/102  soft-tissue]
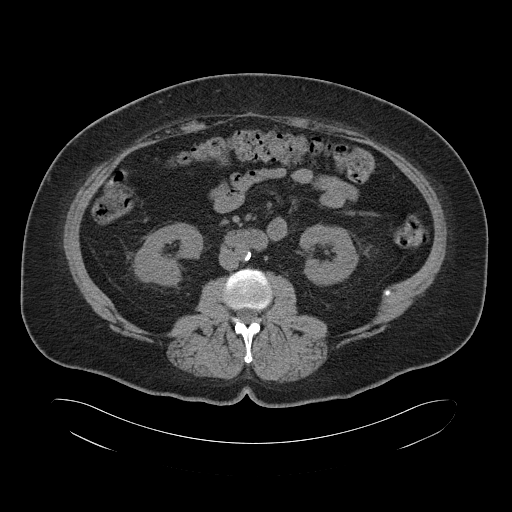
[im 71/102  soft-tissue]
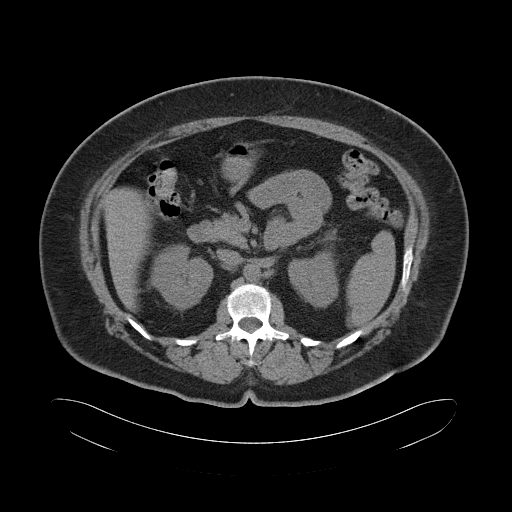
[im 71/102  bone]
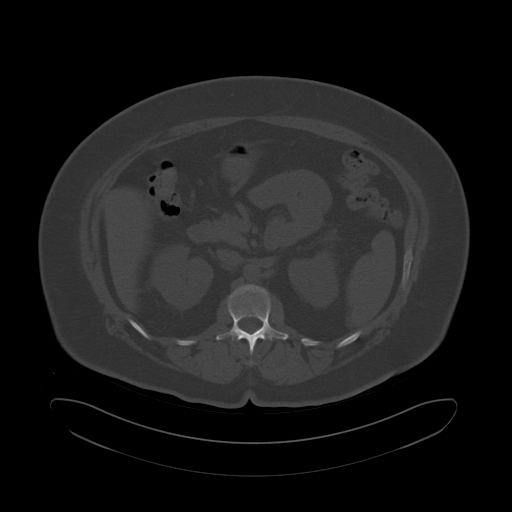
[im 80/102  soft-tissue]
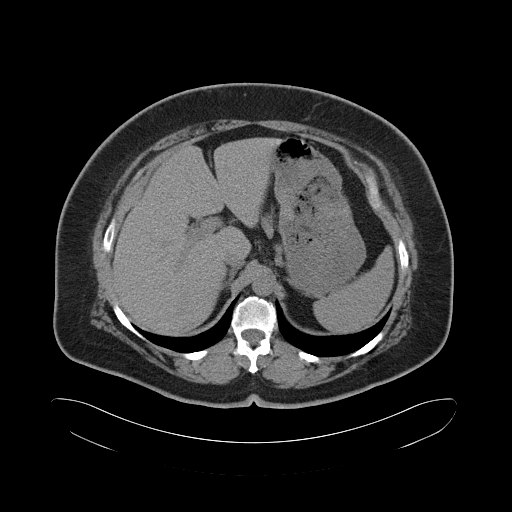
[im 84/102  lung]
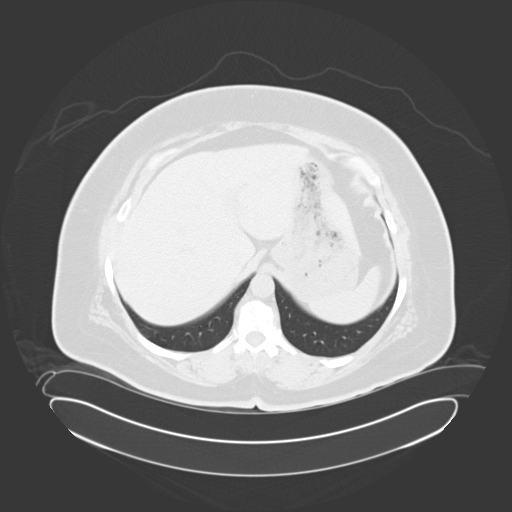
[im 88/102  soft-tissue]
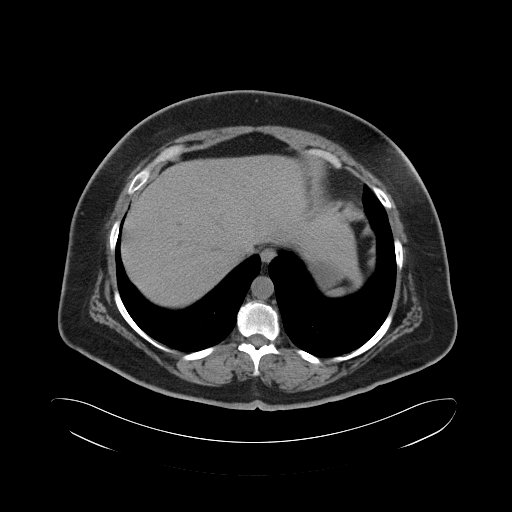
[im 88/102  lung]
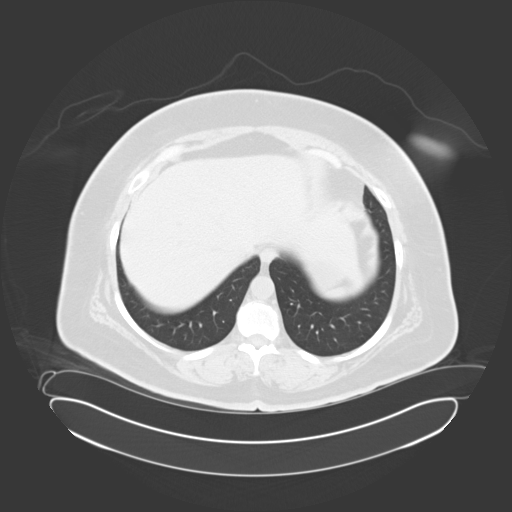
[im 93/102  lung]
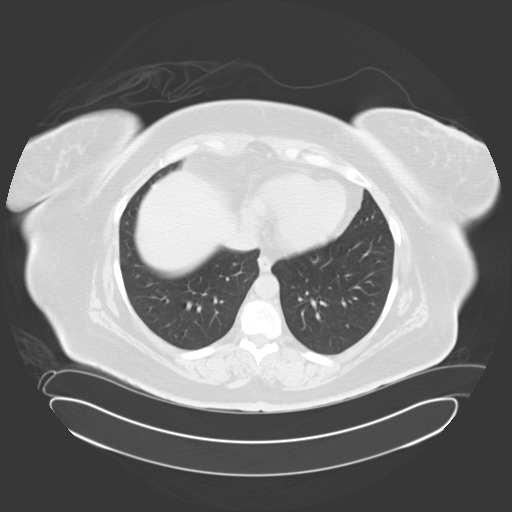
[im 97/102  soft-tissue]
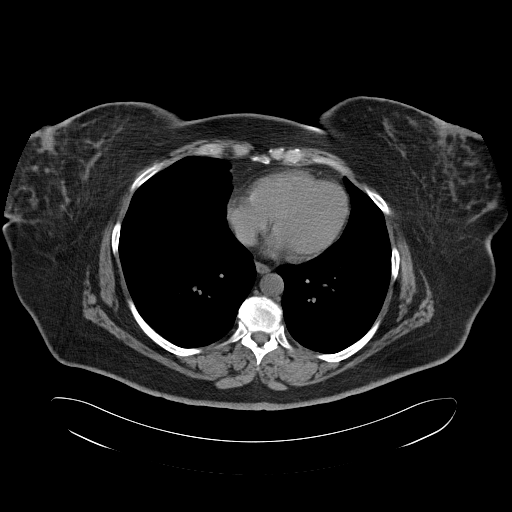
[im 97/102  lung]
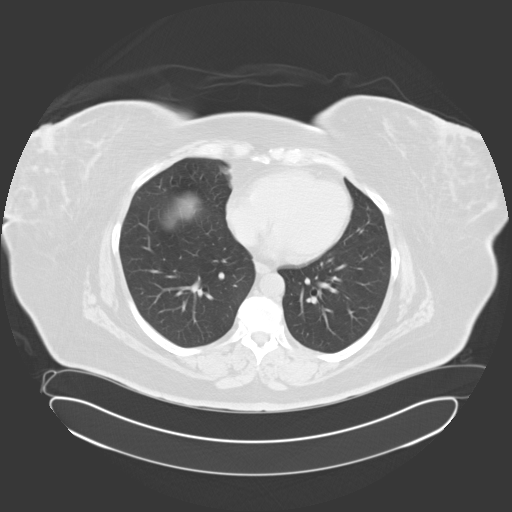

[14 of 32 positions shown; findings below may reference images not displayed]

FINDINGS: Lower chest:  Unremarkable.

Hepatobiliary: No definite cystic or solid hepatic lesions are
identified on today's noncontrast CT examination. Status post
cholecystectomy.

Pancreas: No definite pancreatic mass or peripancreatic inflammatory
changes are identified on today's noncontrast CT examination.

Spleen: Unremarkable.

Adrenals/Urinary Tract: There are no abnormal calcifications within
the collecting system of either kidney, along the course of either
ureter, or within the lumen of the urinary bladder. No
hydroureteronephrosis or perinephric stranding to suggest urinary
tract obstruction at this time. The unenhanced appearance of the
kidneys is unremarkable bilaterally. Mild bilateral perinephric
stranding (nonspecific) incidentally noted. Unenhanced appearance of
the urinary bladder is normal. Bilateral adrenal glands are normal
in appearance.

Stomach/Bowel: Unenhanced appearance of the stomach is normal. No
pathologic dilatation of small bowel or colon. Normal appendix.

Vascular/Lymphatic: Atherosclerosis throughout the abdominal and
pelvic vasculature, without evidence of aneurysm. No lymphadenopathy
noted in the abdomen or pelvis.

Reproductive: Status post hysterectomy. Ovaries are unremarkable in
appearance.

Other: Small supraumbilical ventral hernia containing only a small
amount of omental fat. No significant volume of ascites. No
pneumoperitoneum.

Musculoskeletal: There are no aggressive appearing lytic or blastic
lesions noted in the visualized portions of the skeleton.
IMPRESSION: 1. No explanation for the patient's history of hematuria identified
on today's noncontrast CT examination. Specifically, no urinary
tract calculi or findings of urinary tract obstruction are noted at
this time.
2. No acute findings in the abdomen or pelvis.
3. Normal appendix.
4. Atherosclerosis.
5. Additional incidental findings, as above.

## 2017-06-10 ENCOUNTER — Ambulatory Visit
Admit: 2017-06-10 | Discharge: 2017-06-11 | Payer: PRIVATE HEALTH INSURANCE | Attending: Psychiatry | Primary: Psychiatry

## 2017-06-10 DIAGNOSIS — F332 Major depressive disorder, recurrent severe without psychotic features: Principal | ICD-10-CM

## 2017-06-10 DIAGNOSIS — Z6841 Body Mass Index (BMI) 40.0 and over, adult: Secondary | ICD-10-CM

## 2017-06-10 MED ORDER — DULOXETINE 30 MG CAPSULE,DELAYED RELEASE
ORAL_CAPSULE | Freq: Every day | ORAL | 5 refills | 0 days | Status: CP
Start: 2017-06-10 — End: 2017-07-22

## 2017-07-22 ENCOUNTER — Encounter
Admit: 2017-07-22 | Discharge: 2017-07-23 | Payer: PRIVATE HEALTH INSURANCE | Attending: Psychiatry | Primary: Psychiatry

## 2017-07-22 DIAGNOSIS — Z6841 Body Mass Index (BMI) 40.0 and over, adult: Secondary | ICD-10-CM

## 2017-07-22 DIAGNOSIS — F3341 Major depressive disorder, recurrent, in partial remission: Principal | ICD-10-CM

## 2017-07-22 MED ORDER — DULOXETINE 30 MG CAPSULE,DELAYED RELEASE
ORAL_CAPSULE | Freq: Every day | ORAL | 5 refills | 0.00000 days | Status: CP
Start: 2017-07-22 — End: 2017-11-25

## 2017-07-22 MED ORDER — PROPRANOLOL 10 MG TABLET
ORAL_TABLET | Freq: Two times a day (BID) | ORAL | 5 refills | 0.00000 days | Status: CP | PRN
Start: 2017-07-22 — End: 2017-09-30

## 2017-08-12 ENCOUNTER — Encounter: Admit: 2017-08-12 | Discharge: 2017-08-13 | Payer: PRIVATE HEALTH INSURANCE

## 2017-08-12 DIAGNOSIS — F3341 Major depressive disorder, recurrent, in partial remission: Principal | ICD-10-CM

## 2017-09-30 ENCOUNTER — Encounter
Admit: 2017-09-30 | Discharge: 2017-10-01 | Payer: PRIVATE HEALTH INSURANCE | Attending: Student in an Organized Health Care Education/Training Program | Primary: Student in an Organized Health Care Education/Training Program

## 2017-09-30 DIAGNOSIS — F3341 Major depressive disorder, recurrent, in partial remission: Principal | ICD-10-CM

## 2017-09-30 DIAGNOSIS — F419 Anxiety disorder, unspecified: Secondary | ICD-10-CM

## 2017-09-30 MED ORDER — GABAPENTIN 300 MG CAPSULE
ORAL_CAPSULE | Freq: Every evening | ORAL | 2 refills | 0 days | Status: CP
Start: 2017-09-30 — End: 2017-11-25

## 2017-10-31 ENCOUNTER — Encounter
Admit: 2017-10-31 | Discharge: 2017-11-01 | Payer: PRIVATE HEALTH INSURANCE | Attending: Family Medicine | Primary: Family Medicine

## 2017-10-31 DIAGNOSIS — F332 Major depressive disorder, recurrent severe without psychotic features: Secondary | ICD-10-CM

## 2017-10-31 DIAGNOSIS — I10 Essential (primary) hypertension: Principal | ICD-10-CM

## 2017-10-31 DIAGNOSIS — Z0000 Encounter for general adult medical examination without abnormal findings: Secondary | ICD-10-CM

## 2017-10-31 DIAGNOSIS — K469 Unspecified abdominal hernia without obstruction or gangrene: Secondary | ICD-10-CM

## 2017-10-31 MED ORDER — CHLORTHALIDONE 25 MG TABLET
ORAL_TABLET | Freq: Every morning | ORAL | 3 refills | 0.00000 days | Status: CP
Start: 2017-10-31 — End: 2018-05-07

## 2017-11-25 ENCOUNTER — Encounter
Admit: 2017-11-25 | Discharge: 2017-11-26 | Payer: PRIVATE HEALTH INSURANCE | Attending: Student in an Organized Health Care Education/Training Program | Primary: Student in an Organized Health Care Education/Training Program

## 2017-11-25 DIAGNOSIS — F3341 Major depressive disorder, recurrent, in partial remission: Principal | ICD-10-CM

## 2017-11-25 DIAGNOSIS — F419 Anxiety disorder, unspecified: Secondary | ICD-10-CM

## 2017-11-25 MED ORDER — ARIPIPRAZOLE 5 MG TABLET
ORAL_TABLET | Freq: Every day | ORAL | 2 refills | 0.00000 days | Status: CP
Start: 2017-11-25 — End: 2018-02-03

## 2017-11-25 MED ORDER — DULOXETINE 30 MG CAPSULE,DELAYED RELEASE
ORAL_CAPSULE | Freq: Every day | ORAL | 5 refills | 0.00000 days | Status: CP
Start: 2017-11-25 — End: 2017-12-08

## 2017-11-25 MED ORDER — GABAPENTIN 300 MG CAPSULE
ORAL_CAPSULE | Freq: Two times a day (BID) | ORAL | 2 refills | 0.00000 days | Status: CP
Start: 2017-11-25 — End: 2018-02-03

## 2017-12-08 MED ORDER — HYDROXYZINE HCL 50 MG TABLET
ORAL_TABLET | Freq: Two times a day (BID) | ORAL | 1 refills | 0.00000 days | Status: CP | PRN
Start: 2017-12-08 — End: 2018-04-21

## 2017-12-08 MED ORDER — DULOXETINE 30 MG CAPSULE,DELAYED RELEASE
ORAL_CAPSULE | Freq: Every day | ORAL | 3 refills | 0.00000 days | Status: CP
Start: 2017-12-08 — End: 2018-02-03

## 2017-12-08 MED ORDER — DULOXETINE 30 MG CAPSULE,DELAYED RELEASE: 30 mg | capsule | Freq: Every day | 3 refills | 0 days | Status: AC

## 2018-01-13 ENCOUNTER — Other Ambulatory Visit: Payer: Self-pay | Admitting: Psychiatry

## 2018-02-03 ENCOUNTER — Encounter
Admit: 2018-02-03 | Discharge: 2018-02-04 | Payer: PRIVATE HEALTH INSURANCE | Attending: Student in an Organized Health Care Education/Training Program | Primary: Student in an Organized Health Care Education/Training Program

## 2018-02-03 DIAGNOSIS — F3341 Major depressive disorder, recurrent, in partial remission: Principal | ICD-10-CM

## 2018-02-03 DIAGNOSIS — F419 Anxiety disorder, unspecified: Secondary | ICD-10-CM

## 2018-02-03 MED ORDER — GABAPENTIN 300 MG CAPSULE
ORAL_CAPSULE | Freq: Two times a day (BID) | ORAL | 2 refills | 0 days | Status: CP
Start: 2018-02-03 — End: 2018-04-21

## 2018-02-03 MED ORDER — DULOXETINE 30 MG CAPSULE,DELAYED RELEASE
ORAL_CAPSULE | Freq: Every day | ORAL | 3 refills | 0 days | Status: CP
Start: 2018-02-03 — End: 2018-04-21

## 2018-02-03 MED ORDER — DULOXETINE 60 MG CAPSULE,DELAYED RELEASE
ORAL_CAPSULE | Freq: Every day | ORAL | 3 refills | 0.00000 days | Status: CP
Start: 2018-02-03 — End: 2018-04-21

## 2018-02-03 MED ORDER — BUSPIRONE 7.5 MG TABLET
ORAL_TABLET | Freq: Two times a day (BID) | ORAL | 11 refills | 0 days | Status: CP
Start: 2018-02-03 — End: 2018-04-21

## 2018-04-06 ENCOUNTER — Other Ambulatory Visit: Payer: Self-pay

## 2018-04-06 ENCOUNTER — Encounter: Payer: Self-pay | Admitting: Emergency Medicine

## 2018-04-06 ENCOUNTER — Ambulatory Visit
Admission: EM | Admit: 2018-04-06 | Discharge: 2018-04-06 | Disposition: A | Discharge status: Home / Self Care | Payer: BLUE CROSS/BLUE SHIELD | Attending: Family Medicine | Admitting: Family Medicine

## 2018-04-06 DIAGNOSIS — B9789 Other viral agents as the cause of diseases classified elsewhere: Secondary | ICD-10-CM | POA: Diagnosis not present

## 2018-04-06 DIAGNOSIS — J069 Acute upper respiratory infection, unspecified: Secondary | ICD-10-CM | POA: Diagnosis not present

## 2018-04-06 MED ORDER — PREDNISONE 20 MG PO TABS: 20.0000 mg | ORAL_TABLET | Freq: Every day | ORAL | 0 refills | Status: DC

## 2018-04-06 MED ORDER — HYDROCOD POLST-CPM POLST ER 10-8 MG/5ML PO SUER: 5.0000 mL | Freq: Every evening | ORAL | 0 refills | Status: DC | PRN

## 2018-04-06 NOTE — ED Triage Notes (Signed)
Pt c/o cough, runny nose, sore throat (from coughing,started after cough) Started 2 days ago. Denies body aches, chills, shortness of breath.

## 2018-04-06 NOTE — ED Provider Notes (Signed)
[facility]    CSN: [id number] Arrival date & time: 04/06/18  8921     History   Chief Complaint Chief Complaint  Patient presents with  . Cough    appt    HPI [name] is a 53 y.o. female.   The history is provided by the patient.  Cough  Associated symptoms: rhinorrhea   Associated symptoms: no wheezing   URI  Presenting symptoms: congestion, cough and rhinorrhea   Severity:  Moderate Onset quality:  Sudden Duration:  2 days Timing:  Constant Progression:  Unchanged Chronicity:  New Relieved by:  Nothing Ineffective treatments:  OTC medications Associated symptoms: no wheezing   Risk factors: chronic respiratory disease (asthma) and sick contacts     Past Medical History:  Diagnosis Date  . ADD (attention deficit disorder)   . Asthma   . Back pain   . Breast abscess 1993   right  . Breast screening   . Depression   . Headache    sinus  . Hypertension    diet controlled  . Lump or mass in breast    left  . Obesity, unspecified   . PONV (postoperative nausea and vomiting)   . Screening for obesity   . Tobacco use     Patient Active Problem List   Diagnosis Date Noted  . Asthma 12/15/2015  . Clinical depression 04/17/2015  . Morbid obesity ([place]) 03/21/2015  . GAD (generalized anxiety disorder) 03/15/2015  . Obesity 03/15/2015  . Severe recurrent major depression without psychotic features ([place]) 03/14/2015  . Binge eating disorder 09/27/2014  . Fibromyalgia 09/27/2014  . HLD (hyperlipidemia) 09/27/2014  . BP (high blood pressure) 09/27/2014  . Attention deficit disorder 09/30/2013    Past Surgical History:  Procedure Laterality Date  . ABDOMINAL HYSTERECTOMY  2001  . BREAST BIOPSY Left 2013  . BREAST SURGERY Left 2014   excision of fibroadenoma.  . [place]  . CHOLECYSTECTOMY  1986  . ETHMOIDECTOMY Right 03/28/2015   Procedure: RIGHT ANTERIOR ETHMOIDECTOMY;  Surgeon: Clyde Canterbury, MD;  Location: [place];  Service: ENT;  Laterality: Right;  . IMAGE GUIDED SINUS SURGERY N/A 03/28/2015   Procedure: IMAGE GUIDED SINUS SURGERY;  Surgeon: Clyde Canterbury, MD;  Location: [place];  Service: ENT;  Laterality: N/A;  STRYKER gave disk to cece  . MAXILLARY ANTROSTOMY Right 03/28/2015   Procedure: MAXILLARY ANTROSTOMY;  Surgeon: Clyde Canterbury, MD;  Location: [place];  Service: ENT;  Laterality: Right;    OB History    Gravida  2   Para  2   Term      Preterm      AB      Living  2     SAB      TAB      Ectopic      Multiple      Live Births           Obstetric Comments  Menstrual age: 64  Age 1st Pregnancy: 42         Home Medications    Prior to Admission medications   Medication Sig Start Date End Date Taking? Authorizing Provider  ARIPiprazole (ABILIFY) 5 MG tablet Take 0.5 tablets (2.5 mg total) by mouth daily. Take on M, W, F- pt has supply Will end in 40 days 03/31/17  Yes Rainey Pines, MD  chlorthalidone (HYGROTON) 25 MG tablet  01/07/17  Yes [provider]  DULoxetine (  CYMBALTA) 60 MG capsule Take 1 capsule (60 mg total) daily by mouth. 01/13/17  Yes Rainey Pines, MD  SUMAtriptan (IMITREX) 50 MG tablet Take one tablet PRN for migraine. If no improvement, can take a second pill in 2 hrs. No more than 2 pills in one week. 05/21/16  Yes [provider]  busPIRone (BUSPAR) 10 MG tablet Take 1 tablet (10 mg total) by mouth at bedtime. 03/31/17   Rainey Pines, MD  chlorpheniramine-HYDROcodone (TUSSIONEX PENNKINETIC ER) 10-8 MG/5ML SUER Take 5 mLs by mouth at bedtime as needed. 04/06/18   Norval Gable, MD  lithium carbonate (LITHOBID) 300 MG CR tablet Take 1 tablet (300 mg total) by mouth at bedtime. 03/31/17   Rainey Pines, MD  predniSONE (DELTASONE) 20 MG tablet Take 1 tablet (20 mg total) by mouth daily. 04/06/18   Norval Gable, MD    Family History Family History  Problem Relation Age of Onset  . Other Other 65        Colon Cancer, Skin Cancer  . Other Maternal Uncle 70       Prostate Cancer  . Diabetes Mother   . Heart disease Mother   . Parkinson's disease Father   . Colon cancer Father   . Hypotension Father   . Alcohol abuse Father   . Seizures Sister   . Depression Sister   . Depression Sister     Social History Social History   Tobacco Use  . Smoking status: Former Smoker    Packs/day: 1.00    Types: Cigarettes    Last attempt to quit: 09/27/1994    Years since quitting: 23.5  . Smokeless tobacco: Never Used  Substance Use Topics  . Alcohol use: No    Alcohol/week: 0.0 standard drinks    Comment: 1x/6 mos  . Drug use: No     Allergies   Cyclosporine; Haldol [haloperidol lactate]; Haloperidol; Prednisone; and Tetracycline   Review of Systems Review of Systems  HENT: Positive for congestion and rhinorrhea.   Respiratory: Positive for cough. Negative for wheezing.      Physical Exam Triage Vital Signs ED Triage Vitals  Enc Vitals Group     BP 04/06/18 0945 (!) 112/94     Pulse Rate 04/06/18 0945 84     Resp 04/06/18 0945 18     Temp 04/06/18 0945 99 F (37.2 C)     Temp Source 04/06/18 0945 Oral     SpO2 04/06/18 0945 98 %     Weight 04/06/18 0942 275 lb (124.7 kg)     Height 04/06/18 0942 5' 5.5" (1.664 m)     Head Circumference --      Peak Flow --      Pain Score 04/06/18 0942 0     Pain Loc --      Pain Edu? --      Excl. in [place]? --    No data found.  Updated Vital Signs BP (!) 112/94 (BP Location: Left Arm)   Pulse 84   Temp 99 F (37.2 C) (Oral)   Resp 18   Ht 5' 5.5" (1.664 m)   Wt 124.7 kg   SpO2 98%   BMI 45.07 kg/m   Visual Acuity Right Eye Distance:   Left Eye Distance:   Bilateral Distance:    Right Eye Near:   Left Eye Near:    Bilateral Near:     Physical Exam Vitals signs and nursing note reviewed.  Constitutional:  General: She is not in acute distress.    Appearance: She is well-developed. She is not diaphoretic.    HENT:     Head: Normocephalic and atraumatic.     Right Ear: Tympanic membrane, ear canal and external ear normal.     Left Ear: Tympanic membrane, ear canal and external ear normal.     Nose: Rhinorrhea present.     Mouth/Throat:     Pharynx: Uvula midline. No oropharyngeal exudate.  Eyes:     General: No scleral icterus.       Right eye: No discharge.        Left eye: No discharge.  Neck:     Musculoskeletal: Normal range of motion and neck supple.     Thyroid: No thyromegaly.  Cardiovascular:     Rate and Rhythm: Normal rate and regular rhythm.     Heart sounds: Normal heart sounds.  Pulmonary:     Effort: Pulmonary effort is normal. No respiratory distress.     Breath sounds: Normal breath sounds. No stridor. No wheezing, rhonchi or rales.  Lymphadenopathy:     Cervical: No cervical adenopathy.      UC Treatments / Results  Labs (all labs ordered are listed, but only abnormal results are displayed) Labs Reviewed - No data to display  EKG None  Radiology No results found.  Procedures Procedures (including critical care time)  Medications Ordered in UC Medications - No data to display  Initial Impression / Assessment and Plan / UC Course  I have reviewed the triage vital signs and the nursing notes.  Pertinent labs & imaging results that were available during my care of the patient were reviewed by me and considered in my medical decision making (see chart for details).      Final Clinical Impressions(s) / UC Diagnoses   Final diagnoses:  Viral URI with cough    ED Prescriptions    Medication Sig Dispense Auth. Provider   predniSONE (DELTASONE) 20 MG tablet Take 1 tablet (20 mg total) by mouth daily. 5 tablet Norval Gable, MD   chlorpheniramine-HYDROcodone (TUSSIONEX PENNKINETIC ER) 10-8 MG/5ML SUER Take 5 mLs by mouth at bedtime as needed. 60 mL Norval Gable, MD      1. diagnosis reviewed with patient 2. rx as per orders above; reviewed  possible side effects, interactions, risks and benefits  3. Recommend supportive treatment with rest, fluids 4. Follow-up prn if symptoms worsen or don't improve   Controlled Substance Prescriptions [place] Controlled Substance Registry consulted? Not Applicable   Norval Gable, MD 04/06/18 1031

## 2018-04-21 ENCOUNTER — Encounter
Admit: 2018-04-21 | Discharge: 2018-04-22 | Payer: PRIVATE HEALTH INSURANCE | Attending: Student in an Organized Health Care Education/Training Program | Primary: Student in an Organized Health Care Education/Training Program

## 2018-04-21 DIAGNOSIS — F419 Anxiety disorder, unspecified: Secondary | ICD-10-CM

## 2018-04-21 DIAGNOSIS — F3341 Major depressive disorder, recurrent, in partial remission: Principal | ICD-10-CM

## 2018-04-21 MED ORDER — DULOXETINE 60 MG CAPSULE,DELAYED RELEASE
ORAL_CAPSULE | Freq: Every day | ORAL | 1 refills | 0.00000 days | Status: CP
Start: 2018-04-21 — End: 2018-06-23

## 2018-04-21 MED ORDER — GABAPENTIN 300 MG CAPSULE
ORAL_CAPSULE | Freq: Two times a day (BID) | ORAL | 1 refills | 0.00000 days | Status: CP
Start: 2018-04-21 — End: 2018-09-22

## 2018-04-21 MED ORDER — DULOXETINE 30 MG CAPSULE,DELAYED RELEASE
ORAL_CAPSULE | Freq: Every day | ORAL | 1 refills | 0.00000 days | Status: CP
Start: 2018-04-21 — End: 2018-06-23

## 2018-04-21 MED ORDER — ARIPIPRAZOLE 5 MG TABLET
ORAL_TABLET | Freq: Every day | ORAL | 1 refills | 0.00000 days | Status: CP
Start: 2018-04-21 — End: 2018-10-06

## 2018-05-07 DIAGNOSIS — I10 Essential (primary) hypertension: Principal | ICD-10-CM

## 2018-05-07 MED ORDER — CHLORTHALIDONE 25 MG TABLET
ORAL_TABLET | 0 refills | 0 days | Status: CP
Start: 2018-05-07 — End: 2018-08-28

## 2018-06-22 ENCOUNTER — Other Ambulatory Visit: Payer: Self-pay | Admitting: Psychiatry

## 2018-06-23 MED ORDER — DULOXETINE 60 MG CAPSULE,DELAYED RELEASE
ORAL_CAPSULE | Freq: Every day | ORAL | 1 refills | 0 days | Status: CP
Start: 2018-06-23 — End: 2018-10-06

## 2018-06-23 MED ORDER — DULOXETINE 30 MG CAPSULE,DELAYED RELEASE
ORAL_CAPSULE | Freq: Every day | ORAL | 1 refills | 0 days | Status: CP
Start: 2018-06-23 — End: 2018-10-13

## 2018-07-21 ENCOUNTER — Encounter
Admit: 2018-07-21 | Discharge: 2018-07-22 | Payer: PRIVATE HEALTH INSURANCE | Attending: Student in an Organized Health Care Education/Training Program | Primary: Student in an Organized Health Care Education/Training Program

## 2018-07-21 DIAGNOSIS — F3341 Major depressive disorder, recurrent, in partial remission: Principal | ICD-10-CM

## 2018-07-21 DIAGNOSIS — F419 Anxiety disorder, unspecified: Secondary | ICD-10-CM

## 2018-08-13 ENCOUNTER — Other Ambulatory Visit: Payer: Self-pay

## 2018-08-13 ENCOUNTER — Encounter: Payer: Self-pay | Admitting: Emergency Medicine

## 2018-08-13 ENCOUNTER — Ambulatory Visit
Admission: EM | Admit: 2018-08-13 | Discharge: 2018-08-13 | Disposition: A | Discharge status: Home / Self Care | Payer: BC Managed Care – PPO | Attending: Emergency Medicine | Admitting: Emergency Medicine

## 2018-08-13 DIAGNOSIS — X501XXA Overexertion from prolonged static or awkward postures, initial encounter: Secondary | ICD-10-CM

## 2018-08-13 DIAGNOSIS — M545 Low back pain, unspecified: Secondary | ICD-10-CM

## 2018-08-13 DIAGNOSIS — T148XXA Other injury of unspecified body region, initial encounter: Secondary | ICD-10-CM

## 2018-08-13 LAB — URINALYSIS, COMPLETE (UACMP) WITH MICROSCOPIC
Bacteria, UA: NONE SEEN
Bilirubin Urine: NEGATIVE
Glucose, UA: NEGATIVE mg/dL
Hgb urine dipstick: NEGATIVE
Ketones, ur: NEGATIVE mg/dL
Leukocytes,Ua: NEGATIVE
Nitrite: NEGATIVE
Protein, ur: NEGATIVE mg/dL
RBC / HPF: NONE SEEN RBC/hpf (ref 0–5)
Specific Gravity, Urine: 1.02 (ref 1.005–1.030)
WBC, UA: NONE SEEN WBC/hpf (ref 0–5)
pH: 7 (ref 5.0–8.0)

## 2018-08-13 MED ORDER — NAPROXEN 500 MG PO TABS: 500.0000 mg | ORAL_TABLET | Freq: Two times a day (BID) | ORAL | 0 refills | Status: DC

## 2018-08-13 NOTE — Discharge Instructions (Addendum)
Follow-up with physical therapy encouraged.

## 2018-08-13 NOTE — ED Triage Notes (Signed)
Patient states she has had low back pain x 2 weeks. She states she holds her urine for about 15 hours per day and when she goes it is very dark. She has no other symptoms.

## 2018-08-13 NOTE — ED Provider Notes (Signed)
[facility]    CSN: [id number] Arrival date & time: 08/13/18  1419      History   Chief Complaint Chief Complaint  Patient presents with  . Back Pain    HPI [name] is a 53 y.o. female. 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 Paten states she has had pain like before and believes that it is due to her kidneys. States she drinks water throughout the day.   The history is provided by the patient. No language interpreter was used.  Back Pain Location:  Lumbar spine Quality:  Aching and stabbing Radiates to:  Does not radiate Pain severity:  Moderate Pain is:  Same all the time Duration:  2 days Timing:  Intermittent Progression:  Worsening Context: not falling, not MVA, not recent illness and not recent injury   Relieved by:  Ibuprofen Worsened by:  Standing Associated symptoms: numbness   Associated symptoms: no leg pain, no pelvic pain and no weight loss     Past Medical History:  Diagnosis Date  . ADD (attention deficit disorder)   . Asthma   . Back pain   . Breast abscess 1993   right  . Breast screening   . Depression   . Headache    sinus  . Hypertension    diet controlled  . Lump or mass in breast    left  . Obesity, unspecified   . PONV (postoperative nausea and vomiting)   . Screening for obesity   . Tobacco use     Patient Active Problem List   Diagnosis Date Noted  . Asthma 12/15/2015  . Clinical depression 04/17/2015  . Morbid obesity ([place]) 03/21/2015  . GAD (generalized anxiety disorder) 03/15/2015  . Obesity 03/15/2015  . Severe recurrent major depression without psychotic features ([place]) 03/14/2015  . Binge eating disorder 09/27/2014  . Fibromyalgia 09/27/2014  . HLD (hyperlipidemia) 09/27/2014  . BP (high blood pressure) 09/27/2014  . Attention deficit disorder 09/30/2013    Past Surgical History:  Procedure Laterality Date  . ABDOMINAL HYSTERECTOMY  2001  . BREAST BIOPSY Left  2013  . BREAST SURGERY Left 2014   excision of fibroadenoma.  . [place]  . CHOLECYSTECTOMY  1986  . ETHMOIDECTOMY Right 03/28/2015   Procedure: RIGHT ANTERIOR ETHMOIDECTOMY;  Surgeon: Clyde Canterbury, MD;  Location: [place];  Service: ENT;  Laterality: Right;  . IMAGE GUIDED SINUS SURGERY N/A 03/28/2015   Procedure: IMAGE GUIDED SINUS SURGERY;  Surgeon: Clyde Canterbury, MD;  Location: [place];  Service: ENT;  Laterality: N/A;  STRYKER gave disk to cece  . MAXILLARY ANTROSTOMY Right 03/28/2015   Procedure: MAXILLARY ANTROSTOMY;  Surgeon: Clyde Canterbury, MD;  Location: [place];  Service: ENT;  Laterality: Right;    OB History    Gravida  2   Para  2   Term      Preterm      AB      Living  2     SAB      TAB      Ectopic      Multiple      Live Births           Obstetric Comments  Menstrual age: 66  Age 1st Pregnancy: 70         Home Medications    Prior to Admission medications   Medication Sig Start Date End  Date Taking? Authorizing Provider  ARIPiprazole (ABILIFY) 5 MG tablet Take 0.5 tablets (2.5 mg total) by mouth daily. Take on M, W, F- pt has supply Will end in 40 days 03/31/17  Yes Rainey Pines, MD  chlorthalidone (HYGROTON) 25 MG tablet  01/07/17  Yes [provider]  DULoxetine (CYMBALTA) 60 MG capsule Take 1 capsule (60 mg total) daily by mouth. 01/13/17  Yes Rainey Pines, MD  gabapentin (NEURONTIN) 300 MG capsule Take by mouth. 04/21/18  Yes [provider]  SUMAtriptan (IMITREX) 50 MG tablet Take one tablet PRN for migraine. If no improvement, can take a second pill in 2 hrs. No more than 2 pills in one week. 05/21/16  Yes [provider]  busPIRone (BUSPAR) 10 MG tablet Take 1 tablet (10 mg total) by mouth at bedtime. 03/31/17   Rainey Pines, MD  chlorpheniramine-HYDROcodone (TUSSIONEX PENNKINETIC ER) 10-8 MG/5ML SUER Take 5 mLs by mouth at bedtime as needed. 04/06/18   Norval Gable, MD  lithium carbonate (LITHOBID) 300 MG CR tablet Take 1 tablet (300 mg total) by mouth at bedtime. 03/31/17   Rainey Pines, MD  predniSONE (DELTASONE) 20 MG tablet Take 1 tablet (20 mg total) by mouth daily. 04/06/18   Norval Gable, MD    Family History Family History  Problem Relation Age of Onset  . Other Other 65       Colon Cancer, Skin Cancer  . Other Maternal Uncle 70       Prostate Cancer  . Diabetes Mother   . Heart disease Mother   . Parkinson's disease Father   . Colon cancer Father   . Hypotension Father   . Alcohol abuse Father   . Seizures Sister   . Depression Sister   . Depression Sister     Social History Social History   Tobacco Use  . Smoking status: Former Smoker    Packs/day: 1.00    Types: Cigarettes    Quit date: 09/27/1994    Years since quitting: 23.8  . Smokeless tobacco: Never Used  Substance Use Topics  . Alcohol use: Yes    Alcohol/week: 0.0 standard drinks    Comment: 1x/6 mos  . Drug use: Not on file    Comment: last use 06/10     Allergies   Cyclosporine, Haldol [haloperidol lactate], Haloperidol, Prednisone, and Tetracycline   Review of Systems Review of Systems  Constitutional: Negative for weight loss.  Genitourinary: Negative for pelvic pain.  Musculoskeletal: Positive for back pain.  Neurological: Positive for numbness.       Decreased sensation to bilat legs     Physical Exam Triage Vital Signs ED Triage Vitals  Enc Vitals Group     BP 08/13/18 1435 (!) 146/93     Pulse Rate 08/13/18 1435 91     Resp 08/13/18 1435 18     Temp 08/13/18 1435 98.3 F (36.8 C)     Temp Source 08/13/18 1435 Oral     SpO2 08/13/18 1435 97 %     Weight 08/13/18 1431 300 lb (136.1 kg)     Height 08/13/18 1431 5\' 5"  (1.651 m)     Head Circumference --      Peak Flow --      Pain Score 08/13/18 1430 0     Pain Loc --      Pain Edu? --      Excl. in [place]? --    No data found.  Updated Vital Signs BP (!) 146/93 (  BP Location:  Right Arm)   Pulse 91   Temp 98.3 F (36.8 C) (Oral)   Resp 18   Ht 5\' 5"  (1.651 m)   Wt 300 lb (136.1 kg)   SpO2 97%   BMI 49.92 kg/m    Physical Exam Cardiovascular:     Heart sounds: Normal heart sounds.  Pulmonary:     Breath sounds: Normal breath sounds.  Abdominal:     Tenderness: There is no right CVA tenderness or left CVA tenderness.  Musculoskeletal:        General: Tenderness present.     Comments: Tenderness to L4 and surrounding illiocostalis  Neurological:     Mental Status: She is oriented to person, place, and time.     Motor: No weakness.     Gait: Gait normal.     Deep Tendon Reflexes: Reflexes normal.      UC Treatments / Results  Labs (all labs ordered are listed, but only abnormal results are displayed) Labs Reviewed  URINALYSIS, COMPLETE (UACMP) WITH MICROSCOPIC     Initial Impression / Assessment and Plan / UC Course  I have reviewed the triage vital signs and the nursing notes.  Pertinent labs & imaging results that were available during my care of the patient were reviewed by me and considered in my medical decision making (see chart for details).     Patient presents with lower back pain x 2 weeks. No change in ROM d.t pain; however patient reports increase paid to illiocostalis area with right later rotation and right later bending. US WNL. Presentation and clinic assessment consistent with muscle strain. Patient prescribed aprons 500mg  BID, heat and follow-up with physical therapy suggested. All questions answered and all concerns addressed.   Final Clinical Impressions(s) / UC Diagnoses   Final diagnoses:  Acute right-sided low back pain without sciatica  Muscle strain     Discharge Instructions     Follow-up with physical therapy encouraged.     ED Prescriptions    Medication Sig Dispense Auth. Provider   naproxen (NAPROSYN) 500 MG tablet Take 1 tablet (500 mg total) by mouth 2 (two) times daily. 30 tablet Gertie Baron,  NP       Gertie Baron, NP 08/13/18 1512

## 2018-08-28 MED ORDER — CHLORTHALIDONE 25 MG TABLET
ORAL_TABLET | 0 refills | 0 days | Status: CP
Start: 2018-08-28 — End: ?

## 2018-09-01 ENCOUNTER — Telehealth: Payer: Self-pay | Admitting: *Deleted

## 2018-09-01 DIAGNOSIS — Z20822 Contact with and (suspected) exposure to covid-19: Secondary | ICD-10-CM

## 2018-09-01 NOTE — Telephone Encounter (Signed)
Angie from [facility] Department calling to request COVID-19 testing. Possible exposure to co worker that tested positive. Pt has symptoms of cough and headache.  Pt called and scheduled for testing at [facility] site on 09/01/28. Pt advised to wear a mask and remain in the car at the time of appt. Pt verbalized understanding.

## 2018-09-02 ENCOUNTER — Other Ambulatory Visit: Payer: Self-pay

## 2018-09-02 DIAGNOSIS — Z20822 Contact with and (suspected) exposure to covid-19: Secondary | ICD-10-CM

## 2018-09-09 ENCOUNTER — Telehealth: Payer: Self-pay

## 2018-09-09 LAB — NOVEL CORONAVIRUS, NAA: SARS-CoV-2, NAA: NOT DETECTED

## 2018-09-09 NOTE — Telephone Encounter (Signed)
Checking on COVID 19 results - not available.

## 2018-09-17 ENCOUNTER — Encounter
Admit: 2018-09-17 | Discharge: 2018-09-18 | Disposition: A | Discharge status: Home / Self Care | Payer: PRIVATE HEALTH INSURANCE

## 2018-09-17 ENCOUNTER — Emergency Department
Admit: 2018-09-17 | Discharge: 2018-09-18 | Disposition: A | Discharge status: Home / Self Care | Payer: PRIVATE HEALTH INSURANCE

## 2018-09-17 DIAGNOSIS — K429 Umbilical hernia without obstruction or gangrene: Principal | ICD-10-CM

## 2018-09-17 MED ORDER — OXYCODONE 5 MG TABLET
ORAL_TABLET | ORAL | 0 refills | 2.00000 days | Status: CP | PRN
Start: 2018-09-17 — End: 2018-09-25

## 2018-09-17 MED ORDER — ONDANSETRON 4 MG DISINTEGRATING TABLET
ORAL_TABLET | Freq: Three times a day (TID) | ORAL | 0 refills | 5 days | Status: CP | PRN
Start: 2018-09-17 — End: 2018-10-12

## 2018-09-22 ENCOUNTER — Encounter: Admit: 2018-09-22 | Discharge: 2018-09-23 | Payer: PRIVATE HEALTH INSURANCE | Attending: Surgery | Primary: Surgery

## 2018-09-22 DIAGNOSIS — K439 Ventral hernia without obstruction or gangrene: Principal | ICD-10-CM

## 2018-09-23 ENCOUNTER — Encounter: Admit: 2018-09-23 | Discharge: 2018-09-23 | Payer: PRIVATE HEALTH INSURANCE

## 2018-09-23 ENCOUNTER — Encounter: Admit: 2018-09-23 | Discharge: 2018-09-23 | Payer: PRIVATE HEALTH INSURANCE | Attending: Family | Primary: Family

## 2018-09-23 DIAGNOSIS — I10 Essential (primary) hypertension: Secondary | ICD-10-CM

## 2018-09-23 DIAGNOSIS — J4520 Mild intermittent asthma, uncomplicated: Secondary | ICD-10-CM

## 2018-09-23 DIAGNOSIS — Z6841 Body Mass Index (BMI) 40.0 and over, adult: Secondary | ICD-10-CM

## 2018-09-23 DIAGNOSIS — Z01818 Encounter for other preprocedural examination: Principal | ICD-10-CM

## 2018-09-23 DIAGNOSIS — Z20828 Contact with and (suspected) exposure to other viral communicable diseases: Principal | ICD-10-CM

## 2018-09-23 DIAGNOSIS — F329 Major depressive disorder, single episode, unspecified: Secondary | ICD-10-CM

## 2018-09-24 DIAGNOSIS — K436 Other and unspecified ventral hernia with obstruction, without gangrene: Principal | ICD-10-CM

## 2018-09-25 ENCOUNTER — Encounter
Admit: 2018-09-25 | Discharge: 2018-09-25 | Payer: PRIVATE HEALTH INSURANCE | Attending: Certified Registered" | Primary: Certified Registered"

## 2018-09-25 ENCOUNTER — Encounter: Admit: 2018-09-25 | Discharge: 2018-09-25 | Payer: PRIVATE HEALTH INSURANCE

## 2018-09-25 DIAGNOSIS — K436 Other and unspecified ventral hernia with obstruction, without gangrene: Principal | ICD-10-CM

## 2018-09-25 MED ORDER — OXYCODONE 5 MG TABLET
ORAL_TABLET | Freq: Four times a day (QID) | ORAL | 0 refills | 3 days | Status: CP | PRN
Start: 2018-09-25 — End: 2018-09-29

## 2018-09-29 ENCOUNTER — Institutional Professional Consult (permissible substitution): Admit: 2018-09-29 | Discharge: 2018-09-30 | Payer: PRIVATE HEALTH INSURANCE

## 2018-09-29 DIAGNOSIS — Z8719 Personal history of other diseases of the digestive system: Secondary | ICD-10-CM

## 2018-09-29 DIAGNOSIS — J4520 Mild intermittent asthma, uncomplicated: Secondary | ICD-10-CM

## 2018-09-29 DIAGNOSIS — G8918 Other acute postprocedural pain: Secondary | ICD-10-CM

## 2018-09-29 DIAGNOSIS — Z9889 Other specified postprocedural states: Principal | ICD-10-CM

## 2018-09-29 MED ORDER — OXYCODONE 5 MG TABLET
ORAL_TABLET | Freq: Four times a day (QID) | ORAL | 0 refills | 3 days | Status: CP | PRN
Start: 2018-09-29 — End: 2018-10-12

## 2018-09-29 MED ORDER — COMBIVENT RESPIMAT 20 MCG-100 MCG/ACTUATION SOLUTION FOR INHALATION
Freq: Four times a day (QID) | RESPIRATORY_TRACT | 0 refills | 0.00000 days | Status: CP
Start: 2018-09-29 — End: ?

## 2018-10-06 MED ORDER — ARIPIPRAZOLE 5 MG TABLET
ORAL_TABLET | Freq: Every day | ORAL | 1 refills | 90 days | Status: CP
Start: 2018-10-06 — End: ?

## 2018-10-06 MED ORDER — DULOXETINE 60 MG CAPSULE,DELAYED RELEASE
ORAL_CAPSULE | Freq: Every day | ORAL | 1 refills | 90 days | Status: CP
Start: 2018-10-06 — End: ?

## 2018-10-12 ENCOUNTER — Encounter: Admit: 2018-10-12 | Discharge: 2018-10-13 | Payer: PRIVATE HEALTH INSURANCE | Attending: Surgery | Primary: Surgery

## 2018-10-12 DIAGNOSIS — Z8719 Personal history of other diseases of the digestive system: Secondary | ICD-10-CM

## 2018-10-12 DIAGNOSIS — Z9889 Other specified postprocedural states: Principal | ICD-10-CM

## 2018-10-13 ENCOUNTER — Encounter
Admit: 2018-10-13 | Discharge: 2018-10-14 | Payer: PRIVATE HEALTH INSURANCE | Attending: Student in an Organized Health Care Education/Training Program | Primary: Student in an Organized Health Care Education/Training Program

## 2018-10-13 DIAGNOSIS — F3341 Major depressive disorder, recurrent, in partial remission: Principal | ICD-10-CM

## 2018-10-13 DIAGNOSIS — Z79899 Other long term (current) drug therapy: Secondary | ICD-10-CM

## 2018-10-13 DIAGNOSIS — F419 Anxiety disorder, unspecified: Secondary | ICD-10-CM

## 2018-10-13 MED ORDER — DULOXETINE 30 MG CAPSULE,DELAYED RELEASE: 30 mg | capsule | Freq: Every day | 0 refills | 15 days | Status: AC

## 2018-10-13 MED ORDER — DULOXETINE 30 MG CAPSULE,DELAYED RELEASE
ORAL_CAPSULE | Freq: Every day | ORAL | 0 refills | 180.00000 days | Status: CP
Start: 2018-10-13 — End: ?

## 2018-10-27 MED ORDER — GABAPENTIN 300 MG CAPSULE
ORAL_CAPSULE | Freq: Every day | ORAL | 1 refills | 90 days | Status: CP
Start: 2018-10-27 — End: ?

## 2018-11-27 DIAGNOSIS — I10 Essential (primary) hypertension: Secondary | ICD-10-CM

## 2018-11-27 MED ORDER — CHLORTHALIDONE 25 MG TABLET
ORAL_TABLET | 1 refills | 0 days | Status: CP
Start: 2018-11-27 — End: ?

## 2018-12-21 ENCOUNTER — Encounter: Payer: Self-pay | Admitting: Emergency Medicine

## 2018-12-21 ENCOUNTER — Ambulatory Visit (INDEPENDENT_AMBULATORY_CARE_PROVIDER_SITE_OTHER): Payer: BC Managed Care – PPO

## 2018-12-21 ENCOUNTER — Other Ambulatory Visit: Payer: Self-pay

## 2018-12-21 ENCOUNTER — Ambulatory Visit
Admission: EM | Admit: 2018-12-21 | Discharge: 2018-12-21 | Disposition: A | Discharge status: Home / Self Care | Payer: BC Managed Care – PPO | Attending: Family Medicine | Admitting: Family Medicine

## 2018-12-21 DIAGNOSIS — M25571 Pain in right ankle and joints of right foot: Secondary | ICD-10-CM

## 2018-12-21 DIAGNOSIS — G8929 Other chronic pain: Secondary | ICD-10-CM | POA: Diagnosis not present

## 2018-12-21 DIAGNOSIS — S93401A Sprain of unspecified ligament of right ankle, initial encounter: Secondary | ICD-10-CM

## 2018-12-21 MED ORDER — CHLORTHALIDONE 25 MG PO TABS: 25.0000 mg | ORAL_TABLET | Freq: Every day | ORAL | 1 refills | Status: AC

## 2018-12-21 MED ORDER — MELOXICAM 15 MG PO TABS: 15.0000 mg | ORAL_TABLET | Freq: Every day | ORAL | 0 refills | Status: AC | PRN

## 2018-12-21 NOTE — Discharge Instructions (Signed)
Rest, ice, elevation.  Medication as directed.  Boot for the next 1-2 weeks.  If persists, see Ortho ([facility] or [facility]).  Take care  Dr. Lacinda Axon

## 2018-12-21 NOTE — ED Triage Notes (Signed)
Patient in today c/o right ankle pain. Patient states she injured her ankle ~7 weeks ago. Patient has not had any evaluation on the ankle.

## 2018-12-21 NOTE — ED Provider Notes (Signed)
[facility]    CSN: [id number] Arrival date & time: 12/21/18  1420  History   Chief Complaint Chief Complaint  Patient presents with  . Ankle Injury    APPT right   HPI  53 year old female presents with ankle pain/injury.  Patient reports that she injured her ankle approximately 7 weeks ago after she fell trying to get on her underwear.  Patient states that since that time she has had several small injuries since then.  Most recent injury was 2 days ago in which she twisted her ankle.  She localizes the pain to the medial aspect of the right ankle.  Associated swelling.  She is able to ambulate with some difficulty.  She is used over-the-counter anti-inflammatories without resolution.  Seems to be worse with activity.  No relieving factors.  No other associated symptoms.  No other complaints.  PMH, Surgical Hx, Family Hx, Social History reviewed and updated as below.  Past Medical History:  Diagnosis Date  . ADD (attention deficit disorder)   . Asthma   . Back pain   . Breast abscess 1993   right  . Breast screening   . Depression   . Headache    sinus  . Hypertension    diet controlled  . Lump or mass in breast    left  . Obesity, unspecified   . PONV (postoperative nausea and vomiting)   . Screening for obesity   . Tobacco use    Patient Active Problem List   Diagnosis Date Noted  . Asthma 12/15/2015  . Clinical depression 04/17/2015  . Morbid obesity ([place]) 03/21/2015  . GAD (generalized anxiety disorder) 03/15/2015  . Obesity 03/15/2015  . Severe recurrent major depression without psychotic features ([place]) 03/14/2015  . Binge eating disorder 09/27/2014  . Fibromyalgia 09/27/2014  . HLD (hyperlipidemia) 09/27/2014  . BP (high blood pressure) 09/27/2014  . Attention deficit disorder 09/30/2013   Past Surgical History:  Procedure Laterality Date  . ABDOMINAL HYSTERECTOMY  2001  . BREAST BIOPSY Left 2013  . BREAST SURGERY Left 2014   excision of  fibroadenoma.  . [place]  . CHOLECYSTECTOMY  1986  . ETHMOIDECTOMY Right 03/28/2015   Procedure: RIGHT ANTERIOR ETHMOIDECTOMY;  Surgeon: Clyde Canterbury, MD;  Location: [place];  Service: ENT;  Laterality: Right;  . HERNIA REPAIR    . IMAGE GUIDED SINUS SURGERY N/A 03/28/2015   Procedure: IMAGE GUIDED SINUS SURGERY;  Surgeon: Clyde Canterbury, MD;  Location: [place];  Service: ENT;  Laterality: N/A;  STRYKER gave disk to cece  . MAXILLARY ANTROSTOMY Right 03/28/2015   Procedure: MAXILLARY ANTROSTOMY;  Surgeon: Clyde Canterbury, MD;  Location: [place];  Service: ENT;  Laterality: Right;   OB History    Gravida  2   Para  2   Term      Preterm      AB      Living  2     SAB      TAB      Ectopic      Multiple      Live Births           Obstetric Comments  Menstrual age: 53  Age 1st Pregnancy: 38       Home Medications    Prior to Admission medications   Medication Sig Start Date End Date Taking? Authorizing Provider  ARIPiprazole (ABILIFY) 5 MG tablet Take 0.5 tablets (2.5 mg total) by  mouth daily. Take on M, W, F- pt has supply Will end in 40 days 03/31/17  Yes Rainey Pines, MD  DULoxetine (CYMBALTA) 60 MG capsule Take 1 capsule (60 mg total) daily by mouth. 01/13/17  Yes Rainey Pines, MD  gabapentin (NEURONTIN) 300 MG capsule Take by mouth. 04/21/18  Yes [provider]  Ipratropium-Albuterol (COMBIVENT RESPIMAT) 20-100 MCG/ACT AERS respimat Inhale into the lungs. 09/29/18  Yes [provider]  SUMAtriptan (IMITREX) 50 MG tablet Take one tablet PRN for migraine. If no improvement, can take a second pill in 2 hrs. No more than 2 pills in one week. 05/21/16  Yes [provider]  chlorthalidone (HYGROTON) 25 MG tablet Take 1 tablet (25 mg total) by mouth daily. 12/21/18   Coral Spikes, DO  meloxicam (MOBIC) 15 MG tablet Take 1 tablet (15 mg total) by mouth daily as needed (Pain/inflammation).  12/21/18   Coral Spikes, DO  lithium carbonate (LITHOBID) 300 MG CR tablet Take 1 tablet (300 mg total) by mouth at bedtime. 03/31/17 12/21/18  Rainey Pines, MD    Family History Family History  Problem Relation Age of Onset  . Other Other 65       Colon Cancer, Skin Cancer  . Other Maternal Uncle 70       Prostate Cancer  . Diabetes Mother   . Heart disease Mother   . Parkinson's disease Father   . Colon cancer Father   . Hypotension Father   . Alcohol abuse Father   . Seizures Sister   . Depression Sister   . Depression Sister     Social History Social History   Tobacco Use  . Smoking status: Former Smoker    Packs/day: 1.00    Types: Cigarettes    Quit date: 09/27/1994    Years since quitting: 24.2  . Smokeless tobacco: Never Used  Substance Use Topics  . Alcohol use: Yes    Alcohol/week: 7.0 standard drinks    Types: 7 Glasses of wine per week  . Drug use: Not Currently    Types: Marijuana    Comment: last use 08/12/18     Allergies   Cyclosporine, Haldol [haloperidol lactate], Haloperidol, Prednisone, and Tetracycline   Review of Systems Review of Systems  Constitutional: Negative.   Musculoskeletal:       Right ankle pain/injury.   Physical Exam Triage Vital Signs ED Triage Vitals  Enc Vitals Group     BP 12/21/18 1433 (!) 122/100     Pulse Rate 12/21/18 1433 78     Resp 12/21/18 1433 18     Temp 12/21/18 1433 98.3 F (36.8 C)     Temp Source 12/21/18 1433 Oral     SpO2 12/21/18 1433 100 %     Weight 12/21/18 1433 280 lb (127 kg)     Height 12/21/18 1433 5\' 6"  (1.676 m)     Head Circumference --      Peak Flow --      Pain Score 12/21/18 1432 6     Pain Loc --      Pain Edu? --      Excl. in [place]? --    Updated Vital Signs BP (!) 122/100 (BP Location: Left Arm) Comment: patient out of HTN meds  Pulse 78   Temp 98.3 F (36.8 C) (Oral)   Resp 18   Ht 5\' 6"  (1.676 m)   Wt 127 kg   SpO2 100%   BMI 45.19 kg/m  Visual Acuity Right Eye  Distance:   Left Eye Distance:   Bilateral Distance:    Right Eye Near:   Left Eye Near:    Bilateral Near:     Physical Exam Vitals signs and nursing note reviewed.  Constitutional:      General: She is not in acute distress.    Appearance: Normal appearance. She is obese. She is not ill-appearing.  HENT:     Head: Normocephalic and atraumatic.  Eyes:     General:        Right eye: No discharge.        Left eye: No discharge.     Conjunctiva/sclera: Conjunctivae normal.  Pulmonary:     Effort: Pulmonary effort is normal. No respiratory distress.  Musculoskeletal:     Comments: Patient with mild swelling and tenderness over the medial malleolus of the right ankle.  Neurological:     Mental Status: She is alert.  Psychiatric:        Mood and Affect: Mood normal.        Behavior: Behavior normal.    UC Treatments / Results  Labs (all labs ordered are listed, but only abnormal results are displayed) Labs Reviewed - No data to display  EKG   Radiology Dg Ankle Complete Right  Result Date: 12/21/2018 CLINICAL DATA:  Right ankle pain and swelling since an injury 7 weeks ago. Initial encounter. EXAM: RIGHT ANKLE - COMPLETE 3+ VIEW COMPARISON:  None. FINDINGS: Medial soft tissue swelling is identified. No acute bony or joint abnormality is identified. Mild to moderate talonavicular degenerative change is seen. No joint effusion. Minimal calcaneal spurring noted. IMPRESSION: No acute bony or joint abnormality. Medial soft tissue swelling noted. Mild to moderate talonavicular degenerative change. Electronically Signed   By: Inge Rise M.D.   On: 12/21/2018 14:58    Procedures Procedures (including critical care time)  Medications Ordered in UC Medications - No data to display  Initial Impression / Assessment and Plan / UC Course  I have reviewed the triage vital signs and the nursing notes.  Pertinent labs & imaging results that were available during my care of the  patient were reviewed by me and considered in my medical decision making (see chart for details).    53 year old female presents with an ankle injury.  She is now having chronic ankle pain.  X-ray negative.  Placing in a cam walker.  Meloxicam as directed.  I have refilled her chlorthalidone at her request.  Advised to see Ortho if she fails to improve or worsens.  Final Clinical Impressions(s) / UC Diagnoses   Final diagnoses:  Chronic pain of right ankle  Sprain of right ankle, unspecified ligament, initial encounter     Discharge Instructions     Rest, ice, elevation.  Medication as directed.  Boot for the next 1-2 weeks.  If persists, see Ortho ([facility] or [facility]).  Take care  Dr. Lacinda Axon    ED Prescriptions    Medication Sig Dispense Auth. Provider   meloxicam (MOBIC) 15 MG tablet Take 1 tablet (15 mg total) by mouth daily as needed (Pain/inflammation). 30 tablet Prerna Harold G, DO   chlorthalidone (HYGROTON) 25 MG tablet Take 1 tablet (25 mg total) by mouth daily. 90 tablet Thersa Salt G, DO     PDMP not reviewed this encounter.   Coral Spikes, [place] 12/21/18 1631

## 2018-12-25 DIAGNOSIS — I10 Essential (primary) hypertension: Principal | ICD-10-CM

## 2019-01-05 ENCOUNTER — Other Ambulatory Visit: Payer: Self-pay | Admitting: Podiatry

## 2019-01-05 ENCOUNTER — Encounter
Admit: 2019-01-05 | Discharge: 2019-01-06 | Payer: PRIVATE HEALTH INSURANCE | Attending: Student in an Organized Health Care Education/Training Program | Primary: Student in an Organized Health Care Education/Training Program

## 2019-01-05 DIAGNOSIS — F419 Anxiety disorder, unspecified: Principal | ICD-10-CM

## 2019-01-05 DIAGNOSIS — F3341 Major depressive disorder, recurrent, in partial remission: Principal | ICD-10-CM

## 2019-01-05 DIAGNOSIS — Z79899 Other long term (current) drug therapy: Principal | ICD-10-CM

## 2019-01-05 DIAGNOSIS — S86111A Strain of other muscle(s) and tendon(s) of posterior muscle group at lower leg level, right leg, initial encounter: Secondary | ICD-10-CM

## 2019-01-05 MED ORDER — DULOXETINE 60 MG CAPSULE,DELAYED RELEASE
ORAL_CAPSULE | Freq: Every day | ORAL | 1 refills | 90.00000 days | Status: CP
Start: 2019-01-05 — End: ?

## 2019-01-05 MED ORDER — ARIPIPRAZOLE 2 MG TABLET
ORAL_TABLET | Freq: Every day | ORAL | 0 refills | 30 days | Status: CP
Start: 2019-01-05 — End: 2019-02-04

## 2019-01-05 MED ORDER — DULOXETINE 30 MG CAPSULE,DELAYED RELEASE
ORAL_CAPSULE | Freq: Every day | ORAL | 1 refills | 90.00000 days | Status: CP
Start: 2019-01-05 — End: ?

## 2019-01-15 ENCOUNTER — Other Ambulatory Visit: Payer: Self-pay

## 2019-01-15 ENCOUNTER — Ambulatory Visit
Admission: RE | Admit: 2019-01-15 | Discharge: 2019-01-15 | Disposition: A | Discharge status: Home / Self Care | Payer: BC Managed Care – PPO | Source: Ambulatory Visit | Attending: Podiatry | Admitting: Podiatry

## 2019-01-15 DIAGNOSIS — S86111A Strain of other muscle(s) and tendon(s) of posterior muscle group at lower leg level, right leg, initial encounter: Secondary | ICD-10-CM | POA: Diagnosis not present

## 2019-01-19 ENCOUNTER — Other Ambulatory Visit: Payer: Self-pay | Admitting: Family Medicine

## 2019-01-21 ENCOUNTER — Other Ambulatory Visit: Payer: Self-pay | Admitting: Podiatry

## 2019-01-21 ENCOUNTER — Other Ambulatory Visit: Payer: Self-pay

## 2019-01-21 ENCOUNTER — Other Ambulatory Visit (HOSPITAL_COMMUNITY): Payer: Self-pay | Admitting: Podiatry

## 2019-01-21 ENCOUNTER — Ambulatory Visit
Admission: RE | Admit: 2019-01-21 | Discharge: 2019-01-21 | Disposition: A | Discharge status: Home / Self Care | Payer: BC Managed Care – PPO | Source: Ambulatory Visit | Attending: Podiatry | Admitting: Podiatry

## 2019-01-21 DIAGNOSIS — I739 Peripheral vascular disease, unspecified: Secondary | ICD-10-CM

## 2019-01-21 DIAGNOSIS — S86111D Strain of other muscle(s) and tendon(s) of posterior muscle group at lower leg level, right leg, subsequent encounter: Secondary | ICD-10-CM | POA: Diagnosis present

## 2019-01-22 ENCOUNTER — Other Ambulatory Visit: Payer: Self-pay | Admitting: Podiatry

## 2019-02-02 ENCOUNTER — Encounter
Admit: 2019-02-02 | Discharge: 2019-02-03 | Payer: PRIVATE HEALTH INSURANCE | Attending: Student in an Organized Health Care Education/Training Program | Primary: Student in an Organized Health Care Education/Training Program

## 2019-02-02 DIAGNOSIS — F419 Anxiety disorder, unspecified: Principal | ICD-10-CM

## 2019-02-02 DIAGNOSIS — Z79899 Other long term (current) drug therapy: Principal | ICD-10-CM

## 2019-02-02 DIAGNOSIS — F3341 Major depressive disorder, recurrent, in partial remission: Principal | ICD-10-CM

## 2019-02-02 MED ORDER — DULOXETINE 60 MG CAPSULE,DELAYED RELEASE
ORAL_CAPSULE | Freq: Every day | ORAL | 1 refills | 90 days | Status: CP
Start: 2019-02-02 — End: ?

## 2019-02-02 MED ORDER — ARIPIPRAZOLE 2 MG TABLET
ORAL_TABLET | Freq: Every day | ORAL | 1 refills | 90 days | Status: CP
Start: 2019-02-02 — End: ?

## 2019-02-02 MED ORDER — HYDROXYZINE HCL 25 MG TABLET
ORAL_TABLET | Freq: Every evening | ORAL | 1 refills | 30.00000 days | Status: CP | PRN
Start: 2019-02-02 — End: ?

## 2019-02-02 MED ORDER — DULOXETINE 30 MG CAPSULE,DELAYED RELEASE
ORAL_CAPSULE | Freq: Every day | ORAL | 1 refills | 90 days | Status: CP
Start: 2019-02-02 — End: ?

## 2019-02-09 ENCOUNTER — Encounter
Admit: 2019-02-09 | Discharge: 2019-02-10 | Payer: PRIVATE HEALTH INSURANCE | Attending: Student in an Organized Health Care Education/Training Program | Primary: Student in an Organized Health Care Education/Training Program

## 2019-02-09 MED ORDER — QUETIAPINE 25 MG TABLET
ORAL_TABLET | Freq: Every evening | ORAL | 0 refills | 90.00000 days | Status: CP
Start: 2019-02-09 — End: 2019-05-10

## 2019-02-12 ENCOUNTER — Other Ambulatory Visit: Payer: BC Managed Care – PPO

## 2019-02-15 ENCOUNTER — Other Ambulatory Visit: Payer: Self-pay

## 2019-02-15 ENCOUNTER — Encounter
Admission: RE | Admit: 2019-02-15 | Discharge: 2019-02-15 | Disposition: A | Discharge status: Home / Self Care | Payer: BC Managed Care – PPO | Source: Ambulatory Visit | Attending: Podiatry | Admitting: Podiatry

## 2019-02-15 DIAGNOSIS — I10 Essential (primary) hypertension: Secondary | ICD-10-CM | POA: Insufficient documentation

## 2019-02-15 DIAGNOSIS — Z01812 Encounter for preprocedural laboratory examination: Secondary | ICD-10-CM | POA: Diagnosis not present

## 2019-02-15 HISTORY — DX: Narcolepsy without cataplexy: G47.419

## 2019-02-15 HISTORY — DX: Family history of other specified conditions: Z84.89

## 2019-02-15 NOTE — Patient Instructions (Signed)
Your procedure is scheduled on: 02-19-19 FRIDAY Report to Same Day Surgery 2nd floor medical mall [facility] Entrance-take elevator on left to 2nd floor.  Check in with surgery information desk.) To find out your arrival time please call [phone] between 1PM - 3PM on 02-18-19 THURSDAY  Remember: Instructions that are not followed completely may result in serious medical risk, up to and including death, or upon the discretion of your surgeon and anesthesiologist your surgery may need to be rescheduled.    _x___ 1. Do not eat food after midnight the night before your procedure. NO GUM OR CANDY AFTER MIDNIGHT. You may drink clear liquids up to 2 hours before you are scheduled to arrive at the hospital for your procedure.  Do not drink clear liquids within 2 hours of your scheduled arrival to the hospital.  Clear liquids include  --Water or Apple juice without pulp  - Gatorade  --Black Coffee or Clear Tea (No milk, no creamers, do not add anything to the coffee or Tea   ____Ensure clear carbohydrate drink on the way to the hospital for bariatric patients  _X___Ensure clear carbohydrate drink 3 hours PRIOR TO ARRIVAL TIME TO HOSPITAL    __x__ 2. No Alcohol for 24 hours before or after surgery.   __x__3. No Smoking or e-cigarettes for 24 prior to surgery.  Do not use any chewable tobacco products for at least 6 hour prior to surgery   ____  4. Bring all medications with you on the day of surgery if instructed.    __x__ 5. Notify your doctor if there is any change in your medical condition     (cold, fever, infections).    x___6. On the morning of surgery brush your teeth with toothpaste and water.  You may rinse your mouth with mouth wash if you wish.  Do not swallow any toothpaste or mouthwash.   Do not wear jewelry, make-up, hairpins, clips or nail polish.  Do not wear lotions, powders, or perfumes.  Do not shave 48 hours prior to surgery. Men may shave face and neck.  Do not  bring valuables to the hospital.    [facility] is not responsible for any belongings or valuables.               Contacts, dentures or bridgework may not be worn into surgery.  Leave your suitcase in the car. After surgery it may be brought to your room.  For patients admitted to the hospital, discharge time is determined by your treatment team.  _  Patients discharged the day of surgery will not be allowed to drive home.  You will need someone to drive you home and stay with you the night of your procedure.    Please read over the following fact sheets that you were given:   [facility] Preparing for Surgery and or MRSA Information   ____ Take anti-hypertensive listed below, cardiac, seizure, asthma, anti-reflux and psychiatric medicines. These include:  1. NONE  2.  3.  4.  5.  6.  ____Fleets enema or Magnesium Citrate as directed.   _x___ Use CHG Soap or sage wipes as directed on instruction sheet   _X___ Use inhalers on the day of surgery and bring to hospital day of [place]  ____ Stop Metformin and Janumet 2 days prior to surgery.    ____ Take 1/2 of usual insulin dose the night before surgery and none  on the morning surgery.   ____ Follow recommendations from Cardiologist, Pulmonologist or PCP regarding stopping Aspirin, Coumadin, Plavix ,Eliquis, Effient, or Pradaxa, and Pletal.  X____Stop Anti-inflammatories such as Advil, Aleve, Ibuprofen, Motrin, Naproxen, Naprosyn, Goodies powders or aspirin products NOW-OK to take Tylenol   ____ Stop supplements until after surgery.     ____ Bring C-Pap to the hospital.

## 2019-02-15 NOTE — Pre-Procedure Instructions (Signed)
ECG 12 Lead3/27/2018 [facility] Result Impression  Rate:  86  Rhythm: sinus Axis: normal PR: 156 QRS: 76 QTc: 459 Infarction: negative for Q-waves, abnormal R-wave progression Ischemia: negative for ST elevation, depression, or T-wave inversions  Overall impression:  Normal sinus rhythm, possible prior septal infarct, no prior ECGs for comparison.   Result Narrative  This result has an attachment that is not available.

## 2019-02-16 ENCOUNTER — Other Ambulatory Visit: Payer: Self-pay

## 2019-02-16 ENCOUNTER — Encounter
Admission: RE | Admit: 2019-02-16 | Discharge: 2019-02-16 | Disposition: A | Discharge status: Home / Self Care | Payer: BC Managed Care – PPO | Source: Ambulatory Visit | Attending: Podiatry | Admitting: Podiatry

## 2019-02-16 ENCOUNTER — Other Ambulatory Visit: Payer: BC Managed Care – PPO

## 2019-02-16 DIAGNOSIS — Z01818 Encounter for other preprocedural examination: Secondary | ICD-10-CM | POA: Insufficient documentation

## 2019-02-16 DIAGNOSIS — S86111D Strain of other muscle(s) and tendon(s) of posterior muscle group at lower leg level, right leg, subsequent encounter: Secondary | ICD-10-CM | POA: Diagnosis not present

## 2019-02-16 DIAGNOSIS — Z20828 Contact with and (suspected) exposure to other viral communicable diseases: Secondary | ICD-10-CM | POA: Insufficient documentation

## 2019-02-16 DIAGNOSIS — I10 Essential (primary) hypertension: Secondary | ICD-10-CM | POA: Diagnosis not present

## 2019-02-16 LAB — COMPREHENSIVE METABOLIC PANEL
ALT: 74 U/L — ABNORMAL HIGH (ref 0–44)
AST: 65 U/L — ABNORMAL HIGH (ref 15–41)
Albumin: 3.6 g/dL (ref 3.5–5.0)
Alkaline Phosphatase: 80 U/L (ref 38–126)
Anion gap: 9 (ref 5–15)
BUN: 16 mg/dL (ref 6–20)
CO2: 30 mmol/L (ref 22–32)
Calcium: 9.3 mg/dL (ref 8.9–10.3)
Chloride: 102 mmol/L (ref 98–111)
Creatinine, Ser: 0.85 mg/dL (ref 0.44–1.00)
GFR calc Af Amer: >60 mL/min (ref >60)
GFR calc non Af Amer: >60 mL/min (ref >60)
Glucose, Bld: 130 mg/dL — ABNORMAL HIGH (ref 70–99)
Potassium: 2.9 mmol/L — ABNORMAL LOW (ref 3.5–5.1)
Sodium: 141 mmol/L (ref 135–145)
Total Bilirubin: 1.3 mg/dL — ABNORMAL HIGH (ref 0.3–1.2)
Total Protein: 7.1 g/dL (ref 6.5–8.1)

## 2019-02-16 LAB — SARS CORONAVIRUS 2 (TAT 6-24 HRS): SARS Coronavirus 2: NEGATIVE

## 2019-02-16 NOTE — Pre-Procedure Instructions (Signed)
Fax send and phone call made to Dr. Deborra Medina office of potassium 2.9 and need for supplement before surgery.

## 2019-02-18 ENCOUNTER — Encounter: Admit: 2019-02-18 | Discharge: 2019-02-19 | Payer: PRIVATE HEALTH INSURANCE

## 2019-02-18 DIAGNOSIS — E876 Hypokalemia: Principal | ICD-10-CM

## 2019-02-18 MED ORDER — POTASSIUM CHLORIDE ER 20 MEQ TABLET,EXTENDED RELEASE(PART/CRYST)
ORAL_TABLET | Freq: Two times a day (BID) | ORAL | 2 refills | 30 days | Status: CP
Start: 2019-02-18 — End: 2019-05-19

## 2019-02-19 ENCOUNTER — Encounter
Admission: RE | Disposition: A | Discharge status: Home / Self Care | Payer: Self-pay | Source: Home / Self Care | Attending: Podiatry

## 2019-02-19 ENCOUNTER — Ambulatory Visit: Payer: Self-pay | Admitting: Anesthesiology

## 2019-02-19 ENCOUNTER — Encounter: Payer: Self-pay | Admitting: Podiatry

## 2019-02-19 ENCOUNTER — Other Ambulatory Visit: Payer: Self-pay

## 2019-02-19 ENCOUNTER — Ambulatory Visit: Payer: Self-pay

## 2019-02-19 ENCOUNTER — Ambulatory Visit
Admission: RE | Admit: 2019-02-19 | Discharge: 2019-02-19 | Disposition: A | Discharge status: Home / Self Care | Payer: BC Managed Care – PPO | Attending: Podiatry | Admitting: Podiatry

## 2019-02-19 ENCOUNTER — Encounter: Payer: Self-pay | Admitting: Anesthesiology

## 2019-02-19 DIAGNOSIS — Z7951 Long term (current) use of inhaled steroids: Secondary | ICD-10-CM | POA: Insufficient documentation

## 2019-02-19 DIAGNOSIS — S96811A Strain of other specified muscles and tendons at ankle and foot level, right foot, initial encounter: Secondary | ICD-10-CM | POA: Diagnosis not present

## 2019-02-19 DIAGNOSIS — M25571 Pain in right ankle and joints of right foot: Secondary | ICD-10-CM | POA: Diagnosis present

## 2019-02-19 DIAGNOSIS — X58XXXA Exposure to other specified factors, initial encounter: Secondary | ICD-10-CM | POA: Insufficient documentation

## 2019-02-19 DIAGNOSIS — J45909 Unspecified asthma, uncomplicated: Secondary | ICD-10-CM | POA: Insufficient documentation

## 2019-02-19 DIAGNOSIS — K760 Fatty (change of) liver, not elsewhere classified: Secondary | ICD-10-CM | POA: Diagnosis not present

## 2019-02-19 DIAGNOSIS — Z833 Family history of diabetes mellitus: Secondary | ICD-10-CM | POA: Diagnosis not present

## 2019-02-19 DIAGNOSIS — E785 Hyperlipidemia, unspecified: Secondary | ICD-10-CM | POA: Insufficient documentation

## 2019-02-19 DIAGNOSIS — Z90710 Acquired absence of both cervix and uterus: Secondary | ICD-10-CM | POA: Insufficient documentation

## 2019-02-19 DIAGNOSIS — K219 Gastro-esophageal reflux disease without esophagitis: Secondary | ICD-10-CM | POA: Diagnosis not present

## 2019-02-19 DIAGNOSIS — Z8249 Family history of ischemic heart disease and other diseases of the circulatory system: Secondary | ICD-10-CM | POA: Insufficient documentation

## 2019-02-19 DIAGNOSIS — Z881 Allergy status to other antibiotic agents status: Secondary | ICD-10-CM | POA: Diagnosis not present

## 2019-02-19 DIAGNOSIS — G47419 Narcolepsy without cataplexy: Secondary | ICD-10-CM | POA: Insufficient documentation

## 2019-02-19 DIAGNOSIS — Z79899 Other long term (current) drug therapy: Secondary | ICD-10-CM | POA: Diagnosis not present

## 2019-02-19 DIAGNOSIS — Z6841 Body Mass Index (BMI) 40.0 and over, adult: Secondary | ICD-10-CM | POA: Insufficient documentation

## 2019-02-19 DIAGNOSIS — Z419 Encounter for procedure for purposes other than remedying health state, unspecified: Secondary | ICD-10-CM

## 2019-02-19 DIAGNOSIS — Z8262 Family history of osteoporosis: Secondary | ICD-10-CM | POA: Diagnosis not present

## 2019-02-19 DIAGNOSIS — F329 Major depressive disorder, single episode, unspecified: Secondary | ICD-10-CM | POA: Diagnosis not present

## 2019-02-19 DIAGNOSIS — I10 Essential (primary) hypertension: Secondary | ICD-10-CM | POA: Diagnosis not present

## 2019-02-19 DIAGNOSIS — F419 Anxiety disorder, unspecified: Secondary | ICD-10-CM | POA: Insufficient documentation

## 2019-02-19 DIAGNOSIS — Z888 Allergy status to other drugs, medicaments and biological substances status: Secondary | ICD-10-CM | POA: Diagnosis not present

## 2019-02-19 DIAGNOSIS — G43909 Migraine, unspecified, not intractable, without status migrainosus: Secondary | ICD-10-CM | POA: Insufficient documentation

## 2019-02-19 DIAGNOSIS — G479 Sleep disorder, unspecified: Secondary | ICD-10-CM | POA: Insufficient documentation

## 2019-02-19 DIAGNOSIS — F909 Attention-deficit hyperactivity disorder, unspecified type: Secondary | ICD-10-CM | POA: Diagnosis not present

## 2019-02-19 DIAGNOSIS — G709 Myoneural disorder, unspecified: Secondary | ICD-10-CM | POA: Insufficient documentation

## 2019-02-19 DIAGNOSIS — Z87891 Personal history of nicotine dependence: Secondary | ICD-10-CM | POA: Insufficient documentation

## 2019-02-19 DIAGNOSIS — Z9049 Acquired absence of other specified parts of digestive tract: Secondary | ICD-10-CM | POA: Diagnosis not present

## 2019-02-19 DIAGNOSIS — Z809 Family history of malignant neoplasm, unspecified: Secondary | ICD-10-CM | POA: Insufficient documentation

## 2019-02-19 HISTORY — DX: Gastro-esophageal reflux disease without esophagitis: K21.9

## 2019-02-19 HISTORY — DX: Attention-deficit hyperactivity disorder, unspecified type: F90.9

## 2019-02-19 HISTORY — PX: TENDON TRANSFER: SHX6109

## 2019-02-19 HISTORY — DX: Fatty (change of) liver, not elsewhere classified: K76.0

## 2019-02-19 HISTORY — PX: TENDON REPAIR: SHX5111

## 2019-02-19 LAB — POCT I-STAT, CHEM 8
BUN: 10 mg/dL (ref 6–20)
Calcium, Ion: 1.18 mmol/L (ref 1.15–1.40)
Chloride: 103 mmol/L (ref 98–111)
Creatinine, Ser: 0.7 mg/dL (ref 0.44–1.00)
Glucose, Bld: 138 mg/dL — ABNORMAL HIGH (ref 70–99)
HCT: 46 % (ref 36.0–46.0)
Hemoglobin: 15.6 g/dL — ABNORMAL HIGH (ref 12.0–15.0)
Potassium: 3.2 mmol/L — ABNORMAL LOW (ref 3.5–5.1)
Sodium: 140 mmol/L (ref 135–145)
TCO2: 25 mmol/L (ref 22–32)

## 2019-02-19 SURGERY — TRANSFER, TENDON
Anesthesia: General | Laterality: Right

## 2019-02-19 MED ORDER — OXYCODONE-ACETAMINOPHEN 5-325 MG PO TABS
ORAL_TABLET | ORAL | Status: AC
  Filled 2019-02-19: qty 1

## 2019-02-19 MED ORDER — CEFAZOLIN SODIUM-DEXTROSE 2-4 GM/100ML-% IV SOLN
INTRAVENOUS | Status: AC
  Filled 2019-02-19: qty 100

## 2019-02-19 MED ORDER — LIDOCAINE HCL (PF) 1 % IJ SOLN
INTRAMUSCULAR | Status: DC | PRN
  Administered 2019-02-19: 20 mL

## 2019-02-19 MED ORDER — ROPIVACAINE HCL 5 MG/ML IJ SOLN
INTRAMUSCULAR | Status: DC | PRN
  Administered 2019-02-19: 30 mL via PERINEURAL

## 2019-02-19 MED ORDER — PROPOFOL 10 MG/ML IV BOLUS
INTRAVENOUS | Status: AC
  Filled 2019-02-19: qty 40

## 2019-02-19 MED ORDER — SCOPOLAMINE 1 MG/3DAYS TD PT72
MEDICATED_PATCH | TRANSDERMAL | Status: AC
  Administered 2019-02-19: 1.5 mg via TRANSDERMAL
  Filled 2019-02-19: qty 1

## 2019-02-19 MED ORDER — SCOPOLAMINE 1 MG/3DAYS TD PT72: 1.0000 | MEDICATED_PATCH | Freq: Once | TRANSDERMAL | Status: DC

## 2019-02-19 MED ORDER — PROPOFOL 10 MG/ML IV BOLUS
INTRAVENOUS | Status: DC | PRN
  Administered 2019-02-19: 150 mg via INTRAVENOUS

## 2019-02-19 MED ORDER — FENTANYL CITRATE (PF) 100 MCG/2ML IJ SOLN
INTRAMUSCULAR | Status: AC
  Filled 2019-02-19: qty 2

## 2019-02-19 MED ORDER — ONDANSETRON HCL 4 MG/2ML IJ SOLN
4.0000 mg | Freq: Once | INTRAMUSCULAR | Status: AC | PRN
  Administered 2019-02-19: 4 mg via INTRAVENOUS

## 2019-02-19 MED ORDER — ONDANSETRON HCL 4 MG/2ML IJ SOLN
INTRAMUSCULAR | Status: DC | PRN
  Administered 2019-02-19: 4 mg via INTRAVENOUS

## 2019-02-19 MED ORDER — OXYCODONE-ACETAMINOPHEN 5-325 MG PO TABS: 1.0000 | ORAL_TABLET | Freq: Once | ORAL | Status: AC

## 2019-02-19 MED ORDER — FENTANYL CITRATE (PF) 100 MCG/2ML IJ SOLN
50.0000 ug | Freq: Once | INTRAMUSCULAR | Status: AC
  Administered 2019-02-19: 50 ug via INTRAVENOUS

## 2019-02-19 MED ORDER — LACTATED RINGERS IV SOLN: INTRAVENOUS | Status: DC

## 2019-02-19 MED ORDER — DEXAMETHASONE SODIUM PHOSPHATE 10 MG/ML IJ SOLN
INTRAMUSCULAR | Status: DC | PRN
  Administered 2019-02-19: 5 mg via INTRAVENOUS

## 2019-02-19 MED ORDER — SUCCINYLCHOLINE 20MG/ML (10ML) SYRINGE FOR MEDFUSION PUMP - OPTIME
INTRAMUSCULAR | Status: DC | PRN
  Administered 2019-02-19: 120 mg via INTRAVENOUS

## 2019-02-19 MED ORDER — PROPOFOL 10 MG/ML IV BOLUS
INTRAVENOUS | Status: AC
  Filled 2019-02-19: qty 20

## 2019-02-19 MED ORDER — FAMOTIDINE 20 MG PO TABS
ORAL_TABLET | ORAL | Status: AC
  Administered 2019-02-19: 20 mg via ORAL
  Filled 2019-02-19: qty 1

## 2019-02-19 MED ORDER — MIDAZOLAM HCL 2 MG/2ML IJ SOLN
INTRAMUSCULAR | Status: AC
  Filled 2019-02-19: qty 2

## 2019-02-19 MED ORDER — LIDOCAINE HCL (CARDIAC) PF 100 MG/5ML IV SOSY
PREFILLED_SYRINGE | INTRAVENOUS | Status: DC | PRN
  Administered 2019-02-19: 100 mg via INTRAVENOUS

## 2019-02-19 MED ORDER — MIDAZOLAM HCL 2 MG/2ML IJ SOLN
INTRAMUSCULAR | Status: AC
  Administered 2019-02-19: 1 mg via INTRAVENOUS
  Filled 2019-02-19: qty 2

## 2019-02-19 MED ORDER — ENOXAPARIN SODIUM 40 MG/0.4ML ~~LOC~~ SOLN: 40.0000 mg | SUBCUTANEOUS | 0 refills | Status: AC

## 2019-02-19 MED ORDER — LIDOCAINE HCL (PF) 1 % IJ SOLN
INTRAMUSCULAR | Status: AC
  Filled 2019-02-19: qty 5

## 2019-02-19 MED ORDER — MIDAZOLAM HCL 2 MG/2ML IJ SOLN: 1.0000 mg | Freq: Once | INTRAMUSCULAR | Status: AC

## 2019-02-19 MED ORDER — OXYCODONE-ACETAMINOPHEN 7.5-325 MG PO TABS: 1.0000 | ORAL_TABLET | Freq: Four times a day (QID) | ORAL | 0 refills | Status: AC | PRN

## 2019-02-19 MED ORDER — FENTANYL CITRATE (PF) 100 MCG/2ML IJ SOLN
INTRAMUSCULAR | Status: AC
  Administered 2019-02-19: 25 ug via INTRAVENOUS
  Filled 2019-02-19: qty 2

## 2019-02-19 MED ORDER — PHENYLEPHRINE HCL-NACL 10-0.9 MG/250ML-% IV SOLN
INTRAVENOUS | Status: DC | PRN
  Administered 2019-02-19: 50 ug/min via INTRAVENOUS

## 2019-02-19 MED ORDER — LIDOCAINE HCL (PF) 1 % IJ SOLN
INTRAMUSCULAR | Status: DC | PRN
  Administered 2019-02-19: 4 mL via INTRADERMAL

## 2019-02-19 MED ORDER — FAMOTIDINE 20 MG PO TABS: 20.0000 mg | ORAL_TABLET | Freq: Once | ORAL | Status: AC

## 2019-02-19 MED ORDER — ONDANSETRON HCL 4 MG/2ML IJ SOLN
INTRAMUSCULAR | Status: AC
  Filled 2019-02-19: qty 2

## 2019-02-19 MED ORDER — ACETAMINOPHEN 10 MG/ML IV SOLN
INTRAVENOUS | Status: AC
  Filled 2019-02-19: qty 100

## 2019-02-19 MED ORDER — FENTANYL CITRATE (PF) 100 MCG/2ML IJ SOLN
25.0000 ug | INTRAMUSCULAR | Status: DC | PRN
  Administered 2019-02-19 (×2): 25 ug via INTRAVENOUS

## 2019-02-19 MED ORDER — POVIDONE-IODINE 7.5 % EX SOLN
Freq: Once | CUTANEOUS | Status: DC
  Filled 2019-02-19: qty 118

## 2019-02-19 MED ORDER — OXYCODONE-ACETAMINOPHEN 5-325 MG PO TABS
ORAL_TABLET | ORAL | Status: AC
  Administered 2019-02-19: 1 via ORAL
  Filled 2019-02-19: qty 1

## 2019-02-19 MED ORDER — ACETAMINOPHEN 10 MG/ML IV SOLN
INTRAVENOUS | Status: DC | PRN
  Administered 2019-02-19: 1000 mg via INTRAVENOUS

## 2019-02-19 MED ORDER — CEFAZOLIN SODIUM-DEXTROSE 2-4 GM/100ML-% IV SOLN
2.0000 g | INTRAVENOUS | Status: AC
  Administered 2019-02-19: 3 g via INTRAVENOUS

## 2019-02-19 MED ORDER — ONDANSETRON HCL 4 MG PO TABS: 4.0000 mg | ORAL_TABLET | Freq: Three times a day (TID) | ORAL | 1 refills | Status: AC | PRN

## 2019-02-19 MED ORDER — MIDAZOLAM HCL 2 MG/2ML IJ SOLN
INTRAMUSCULAR | Status: DC | PRN
  Administered 2019-02-19: 2 mg via INTRAVENOUS

## 2019-02-19 MED ORDER — CEPHALEXIN 500 MG PO CAPS: 500.0000 mg | ORAL_CAPSULE | Freq: Four times a day (QID) | ORAL | 0 refills | Status: AC

## 2019-02-19 MED ORDER — OXYCODONE-ACETAMINOPHEN 5-325 MG PO TABS
1.0000 | ORAL_TABLET | Freq: Once | ORAL | Status: AC
  Administered 2019-02-19: 1 via ORAL

## 2019-02-19 MED ORDER — NEOMYCIN-POLYMYXIN B GU 40-200000 IR SOLN
Status: DC | PRN
  Administered 2019-02-19: 4 mL

## 2019-02-19 MED ORDER — PHENYLEPHRINE HCL (PRESSORS) 10 MG/ML IV SOLN
INTRAVENOUS | Status: DC | PRN
  Administered 2019-02-19 (×6): 100 ug via INTRAVENOUS

## 2019-02-19 MED ORDER — FENTANYL CITRATE (PF) 100 MCG/2ML IJ SOLN
INTRAMUSCULAR | Status: DC | PRN
  Administered 2019-02-19 (×4): 50 ug via INTRAVENOUS

## 2019-02-19 MED ORDER — ROPIVACAINE HCL 5 MG/ML IJ SOLN
INTRAMUSCULAR | Status: AC
  Filled 2019-02-19: qty 30

## 2019-02-19 SURGICAL SUPPLY — 82 items
ANCHOR SUT 1.45 SZ 1 SHORT (Anchor) ×1 IMPLANT
BIT DRILL CANN 4.0X150 IMPLANT
BLADE MED AGGRESSIVE (BLADE) ×1 IMPLANT
BLADE SURG 15 STRL LF DISP TIS (BLADE) ×2 IMPLANT
BLADE SURG 15 STRL SS (BLADE) ×2
BLADE SURG MINI STRL (BLADE) ×2 IMPLANT
BNDG CONFORM 2 STRL LF (GAUZE/BANDAGES/DRESSINGS) ×2 IMPLANT
BNDG CONFORM 3 STRL LF (GAUZE/BANDAGES/DRESSINGS) ×2 IMPLANT
BNDG ELASTIC 4X5.8 VLCR NS LF (GAUZE/BANDAGES/DRESSINGS) ×4 IMPLANT
BNDG ESMARK 4X12 TAN STRL LF (GAUZE/BANDAGES/DRESSINGS) ×2 IMPLANT
BNDG GAUZE 4.5X4.1 6PLY STRL (MISCELLANEOUS) ×1 IMPLANT
CANISTER SUCT 1200ML W/VALVE (MISCELLANEOUS) ×2 IMPLANT
COVER WAND RF STERILE (DRAPES) ×2 IMPLANT
CUFF TOURN SGL QUICK 12 (TOURNIQUET CUFF) IMPLANT
CUFF TOURN SGL QUICK 18X4 (TOURNIQUET CUFF) IMPLANT
CUFF TOURN SGL QUICK 24 (TOURNIQUET CUFF)
CUFF TOURN SGL QUICK 34 (TOURNIQUET CUFF) ×1
CUFF TRNQT CYL 24X4X16.5-23 (TOURNIQUET CUFF) IMPLANT
CUFF TRNQT CYL 34X4.125X (TOURNIQUET CUFF) IMPLANT
DRAPE FLUOR MINI C-ARM 54X84 (DRAPES) ×2 IMPLANT
DRILL CANN 4.0X150 ×2
DURAPREP 26ML APPLICATOR (WOUND CARE) ×2 IMPLANT
Drill 4.0 Cannulated IMPLANT
ELECT REM PT RETURN 9FT ADLT (ELECTROSURGICAL) ×2
ELECTRODE REM PT RTRN 9FT ADLT (ELECTROSURGICAL) ×1 IMPLANT
ETHIBOND 2 0 GREEN CT 2 30IN (SUTURE) ×2 IMPLANT
GAUZE SPONGE 4X4 12PLY STRL (GAUZE/BANDAGES/DRESSINGS) ×2 IMPLANT
GAUZE XEROFORM 1X8 LF (GAUZE/BANDAGES/DRESSINGS) ×2 IMPLANT
GLOVE BIO SURGEON STRL SZ7.5 (GLOVE) ×2 IMPLANT
GLOVE BIO SURGEON STRL SZ8 (GLOVE) ×2 IMPLANT
GLOVE INDICATOR 8.0 STRL GRN (GLOVE) ×2 IMPLANT
GOWN STRL REUS W/ TWL LRG LVL3 (GOWN DISPOSABLE) ×2 IMPLANT
GOWN STRL REUS W/TWL LRG LVL3 (GOWN DISPOSABLE) ×2
HANDLE YANKAUER SUCT BULB TIP (MISCELLANEOUS) ×2 IMPLANT
K-WIRE 1.6X20 (Wire) ×2 IMPLANT
KIT SUTURE 1.8 Q-FIX DISP (KITS) ×1 IMPLANT
KIT TURNOVER KIT A (KITS) ×2 IMPLANT
KWIRE 1.6X20 (Wire) IMPLANT
LABEL OR SOLS (LABEL) ×2 IMPLANT
NDL FILTER BLUNT 18X1 1/2 (NEEDLE) ×1 IMPLANT
NDL HYPO 25X1 1.5 SAFETY (NEEDLE) ×1 IMPLANT
NDL MAYO CATGUT SZ5 (NEEDLE)
NDL SUT 5 .5 CRC TPR PNT MAYO (NEEDLE) IMPLANT
NEEDLE FILTER BLUNT 18X 1/2SAF (NEEDLE) ×1
NEEDLE FILTER BLUNT 18X1 1/2 (NEEDLE) ×1 IMPLANT
NEEDLE HYPO 25X1 1.5 SAFETY (NEEDLE) ×2 IMPLANT
NS IRRIG 1000ML POUR BTL (IV SOLUTION) ×2 IMPLANT
NS IRRIG 500ML POUR BTL (IV SOLUTION) ×2 IMPLANT
Nitinol suture loop IMPLANT
PACK EXTREMITY ARMC (MISCELLANEOUS) ×2 IMPLANT
PAD ABD DERMACEA PRESS 5X9 (GAUZE/BANDAGES/DRESSINGS) ×1 IMPLANT
PAD CAST CTTN 4X4 STRL (SOFTGOODS) ×2 IMPLANT
PAD PREP 24X41 OB/GYN DISP (PERSONAL CARE ITEMS) ×2 IMPLANT
PADDING CAST COTTON 4X4 STRL (SOFTGOODS) ×4
PENCIL ELECTRO HAND CTR (MISCELLANEOUS) ×2 IMPLANT
RASP SM TEAR CROSS CUT (RASP) ×2 IMPLANT
SCREW GRAPPLER 4X16 PEEK (Screw) ×1 IMPLANT
SOL PREP PVP 2OZ (MISCELLANEOUS) ×2
SOLUTION PREP PVP 2OZ (MISCELLANEOUS) ×1 IMPLANT
SPLINT CAST 1 STEP 5X30 WHT (MISCELLANEOUS) ×2 IMPLANT
SPLINT FAST PLASTER 5X30 (CAST SUPPLIES) ×1
SPLINT PLASTER CAST FAST 5X30 (CAST SUPPLIES) ×1 IMPLANT
SPONGE LAP 18X18 RF (DISPOSABLE) ×2 IMPLANT
STOCKINETTE M/LG 89821 (MISCELLANEOUS) ×2 IMPLANT
STOCKINETTE STRL 6IN 960660 (GAUZE/BANDAGES/DRESSINGS) ×2 IMPLANT
STRIP CLOSURE SKIN 1/2X4 (GAUZE/BANDAGES/DRESSINGS) ×2 IMPLANT
SUT ETHILON 4-0 (SUTURE) ×2
SUT ETHILON 4-0 FS2 18XMFL BLK (SUTURE) ×2
SUT MNCRL+ 5-0 VIOLET P-3 (SUTURE) ×1 IMPLANT
SUT MONOCRYL 5-0 (SUTURE) ×1
SUT VIC AB 0 SH 27 (SUTURE) ×2 IMPLANT
SUT VIC AB 2-0 SH 27 (SUTURE) ×4
SUT VIC AB 2-0 SH 27XBRD (SUTURE) ×2 IMPLANT
SUT VIC AB 3-0 SH 27 (SUTURE) ×2
SUT VIC AB 3-0 SH 27X BRD (SUTURE) ×1 IMPLANT
SUT VIC AB 4-0 FS2 27 (SUTURE) ×2 IMPLANT
SUT VICRYL AB 3-0 FS1 BRD 27IN (SUTURE) ×2 IMPLANT
SUTURE ETHLN 4-0 FS2 18XMF BLK (SUTURE) IMPLANT
SWABSTK COMLB BENZOIN TINCTURE (MISCELLANEOUS) ×2 IMPLANT
SYR 10ML LL (SYRINGE) ×4 IMPLANT
SYR 3ML LL SCALE MARK (SYRINGE) ×2 IMPLANT
SYR 5ML LL (SYRINGE) ×2 IMPLANT

## 2019-02-19 NOTE — Discharge Instructions (Signed)
[place] DR. TROXLER, DR. Vickki Muff, AND DR. [place]   1. Take your medication as prescribed.  Pain medication should be taken only as needed.  Try taking her Percocet pain medication once every 6 hours but if it continues to be painful it is okay to take it once every 4 hours.  Also try taking Tylenol or ibuprofen between doses.  If it still continues to be painful then you can take 2 pills every 6 hours.  Try to take the pain medication as sparingly as possible and as needed.  It is important to note that you will likely have pain after the procedure and pain is normal.  Take your antibiotics, antinausea medicine, pain medication, and blood thinner medication as prescribed.  2. Do not take Lovenox (blood thinner) injection until 24-48 hours postop as injections sooner can cause postop bleeding.  This is used to prevent DVT from the nonweightbearing status for the next 4-6 weeks.  3. Keep the dressing clean, dry and intact.  Remain nonweightbearing to the right lower extremity at all times.  He will likely have to remain nonweightbearing for the next 6 weeks.  4. Keep your foot elevated above the heart level for the first 48 hours.  5. Walking to the bathroom and brief periods of walking are acceptable, unless we have instructed you to be non-weight bearing.  6. Always wear your post-op shoe when walking.  Always use your crutches if you are to be non-weight bearing.  7. Do not take a shower. Baths are permissible as long as the foot is kept out of the water.   8. Every hour you are awake:  - Bend your knee 15 times. - Flex foot 15 times - Massage calf 15 times  9. Call [facility] [phone]) if any of the following problems occur: - You develop a temperature or fever. - The bandage becomes saturated with blood. - Medication does not stop your pain. - Injury of the foot  occurs. - Any symptoms of infection including redness, odor, or red streaks running from wound. -

## 2019-02-19 NOTE — Anesthesia Post-op Follow-up Note (Signed)
Anesthesia QCDR form completed.        

## 2019-02-19 NOTE — Transfer of Care (Signed)
Immediate Anesthesia Transfer of Care Note  Patient: [name]  Procedure(s) Performed: FDL TRANSFER;DEEP RIGHT WITH FLURORSCOPY AND SPLINT (Right ) FLEXOR TENDON REPAIR-SECOND RIGHT (Right )  Patient Location: PACU  Anesthesia Type:General  Level of Consciousness: awake, alert  and oriented  Airway & Oxygen Therapy: Patient Spontanous Breathing and Patient connected to face mask oxygen  Post-op Assessment: Report given to RN and Post -op Vital signs reviewed and stable  Post vital signs: Reviewed and stable  Last Vitals:  Vitals Value Taken Time  BP    Temp    Pulse    Resp    SpO2      Last Pain:  Vitals:   02/19/19 0614  TempSrc: Tympanic  PainSc: 6          Complications: No apparent anesthesia complications

## 2019-02-19 NOTE — Anesthesia Preprocedure Evaluation (Addendum)
Anesthesia Evaluation  Patient identified by MRN, date of birth, ID band Patient awake    Reviewed: Allergy & Precautions, H&P , NPO status , Patient's Chart, lab work & pertinent test results, reviewed documented beta blocker date and time   History of Anesthesia Complications (+) PONV, Family history of anesthesia reaction and history of anesthetic complications  Airway Mallampati: II  TM Distance: >3 FB Neck ROM: full    Dental  (+) Teeth Intact   Pulmonary neg pulmonary ROS, asthma , former smoker,    Pulmonary exam normal        Cardiovascular hypertension, negative cardio ROS Normal cardiovascular exam Rhythm:regular Rate:Normal     Neuro/Psych  Headaches, PSYCHIATRIC DISORDERS Anxiety Depression  Neuromuscular disease negative neurological ROS  negative psych ROS   GI/Hepatic Neg liver ROS, GERD  Medicated,  Endo/Other  negative endocrine ROS  Renal/GU negative Renal ROS  negative genitourinary   Musculoskeletal  (+) Fibromyalgia -  Abdominal   Peds  Hematology negative hematology ROS (+)   Anesthesia Other Findings Past Medical History: No date: ADD (attention deficit disorder) No date: ADHD No date: Asthma     Comment:  COUGH VARIANT  No date: Back pain 1993: Breast abscess     Comment:  right No date: Breast screening No date: Depression No date: Family history of adverse reaction to anesthesia     Comment:  MOM AND MATERNAL AUNT-N/V No date: Fatty liver No date: GERD (gastroesophageal reflux disease)     Comment:  OCC-TUMS PRN No date: Headache     Comment:  sinus No date: Hypertension     Comment:  diet controlled No date: Lump or mass in breast     Comment:  left No date: Narcolepsy     Comment:  MILD PER PT No date: Obesity, unspecified No date: PONV (postoperative nausea and vomiting)     Comment:  WOKE UP DURING JAW SURGERY DUE TO IV COMIING OUT DURING               SURGERY AND NOT  RECEIVING ANY MEDICATION  No date: Screening for obesity No date: Tobacco use Past Surgical History: 2001: ABDOMINAL HYSTERECTOMY 2013: BREAST BIOPSY; Left 2014: BREAST SURGERY; Left     Comment:  excision of fibroadenoma. 1986, 1991: CESAREAN SECTION 1986: CHOLECYSTECTOMY 03/28/2015: ETHMOIDECTOMY; Right     Comment:  Procedure: RIGHT ANTERIOR ETHMOIDECTOMY;  Surgeon: Clyde Canterbury, MD;  Location: [place];  Service:               ENT;  Laterality: Right; No date: HERNIA REPAIR 03/28/2015: IMAGE GUIDED SINUS SURGERY; N/A     Comment:  Procedure: IMAGE GUIDED SINUS SURGERY;  Surgeon: Clyde Canterbury, MD;  Location: [place];  Service:               ENT;  Laterality: N/A;  STRYKER gave disk to cece No date: [place] 03/28/2015: MAXILLARY ANTROSTOMY; Right     Comment:  Procedure: MAXILLARY ANTROSTOMY;  Surgeon: Clyde Canterbury,              MD;  Location: [place];  Service: ENT;                Laterality: Right; BMI    Body Mass Index: 46.00 kg/m  Reproductive/Obstetrics negative OB ROS                            Anesthesia Physical Anesthesia Plan  ASA: III  Anesthesia Plan: General ETT   Post-op Pain Management:  Regional for Post-op pain   Induction:   PONV Risk Score and Plan:   Airway Management Planned:   Additional Equipment:   Intra-op Plan:   Post-operative Plan:   Informed Consent: I have reviewed the patients History and Physical, chart, labs and discussed the procedure including the risks, benefits and alternatives for the proposed anesthesia with the patient or authorized representative who has indicated his/her understanding and acceptance.     Dental Advisory Given  Plan Discussed with: CRNA  Anesthesia Plan Comments: (Will plan on postop pain popliteal in pacu.  Risks and benefits discussed and accepted by patient.  JA)        Anesthesia Quick Evaluation

## 2019-02-19 NOTE — Op Note (Signed)
PODIATRY / FOOT AND ANKLE SURGERY OPERATIVE REPORT    SURGEON: Caroline More, DPM  PRE-OPERATIVE DIAGNOSIS:  1.  Right medial foot and ankle posterior tibial tendon tear  POST-OPERATIVE DIAGNOSIS: Same  PROCEDURE(S): 1. Right posterior tibial tendon tear repair 2. Right flexor digitorum longus tendon transfer  HEMOSTASIS: Right thigh tourniquet  ANESTHESIA: general, postop popliteal and saphenous nerve block  ESTIMATED BLOOD LOSS: 40 cc  FINDING(S): 1.  Split longitudinal tear of the posterior tibial tendon from the tip of the medial malleolus to the navicular tuberosity level with degenerative changes seen at the distal aspect of the tendon and internal calcification.  PATHOLOGY/SPECIMEN(S): None taken  INDICATIONS:   [name] is a 53 y.o. female who presents with with right medial ankle pain.  Patient states that she suffered an eversion type ankle sprain and has been having pain at the medial aspect of the ankle and foot ever since that issue occurred.  Patient has been treated conservatively with nonweightbearing and partial weightbearing in a cam boot.  The patient got no better and an MRI was performed showing a split longitudinal tear with degenerative changes distally along the course of the posterior tibial tendon from the medial malleolus to the navicular tuberosity level.  Discussed all treatment options with the patient will conservative and surgical attempts at correction at this time the patient has elected for surgery.  All questions answered..  DESCRIPTION: After obtaining full informed written consent, the patient was brought back to the operating room and placed supine upon the operating table.  The patient received IV antibiotics prior to induction.  After obtaining adequate anesthesia a preop block was performed with 20 cc 1% lidocaine plain about the medial ankle. the patient was prepped and draped in the standard fashion.  An Esmarch bandage was used to  exsanguinate the right lower extremity and pneumatic thigh tourniquet was inflated.  Attention was directed to the medial ankle and medial aspect of the foot where a linear longitudinal incision was made from the navicular tuberosity level along the course the posterior tibial tendon to slightly posterior to the medial malleolus.  The incision was deepened through the subcutaneous tissue utilizing sharp and blunt dissection and care was taken to identify and retract all vital neurovascular structures and all venous contributories were cauterized necessary.  At this time a periosteal type of incision was made at the dorsal medial aspect of the navicular and the posterior tibial tendon was identified during this process as the periosteum was reflected dorsally and plantarly.  An incision was made through the posterior tibial tendon sheath from the navicular tuberosity to the medial malleolar level and the posterior tibial tendon was identified and inspected.  There was a notable longitudinal tear with tendon fraying in the central aspect posterior tibial tendon with slight calcification distally but the tendon did not appear to be ruptured at the insertion point.  The tendon appeared to be significantly degenerated distally though with calcification noted.  Full-thickness flaps were made off the navicular tuberosity of the posterior tibial tendon and the posterior tibial tendon was removed from its main insertion point at the navicular tuberosity and plantar navicular level.  Attention was directed to the flexor digitorum longus sheath which was identified and opened along the course the incision followed distally to the junction of the connection with the flexor hallucis longus tendon.  The flexor digitorum longus was cut slightly more proximal to the level of the connection to the flexor hallucis longus tendon.  The flexor digitorum longus tendon was isolated and mobilized.  At this time the guidewire for the  [employer] 28 Bio-Tenodesis screw was placed from plantar to dorsal through the navicular body and navicular tuberosity level.  C-arm imaging was utilized to verify correct position which appeared to be excellent overall.  A whipstitch was then performed with 2-0 Ethibond through the tendon of the flexor digitorum longus.  It was measured and measured to be at a 4.0 size.  At this time the drill was then placed over the guidewire that was placed the navicular and drilled from plantar to dorsal creating a pilot hole or tunnel for the tendon to go.  At this time the guidewire was removed and the tendon was then passed using the whipstitch through the plantar aspect of the navicular and out the dorsal aspect.  The tendon was then tensioned with the appropriate tension and held into place.  Then at this time a 4.0 [employer] 28 Bio-Tenodesis screw was then placed through the hole with the tendon and the tendon appeared to be well secured and was tied down dorsally.  The posterior tibial tendon was then debrided further removing all nonviable diseased tendon along with calcifications.  The split tear was debrided as needed to produce fresh margins.  At this time the tendon was then retubularized with 2-0 Ethibond in a running interconnected type stitch and reinforced with 2-0 Vicryl.  This time a Biomet Zimmer guidewire for 1.45 mm suture anchor was then placed through the navicular tuberosity level under fluoroscopic guidance.  The guidewire was then removed and the 1.45 mm suture anchor was placed with excellent seating noted.  The distal end of the posterior tibial tendon appeared to be bulky so it was trimmed slightly more.  The main insertion point of the posterior tibial tendon was then reattached to the navicular body and navicular tuberosity level and tied into place utilizing the sutures that were on the suture anchor.  The tendinous repair was then reinforced with 2-0 Ethibond and the flexor tendon was also sutured  to the posterior tibial tendon at that level as well to reinforce the repair yet further.  The surgical site was flushed with copious amounts normal sterile saline.  The periosteum and posterior tibial tendon sheath was then reapproximated well coapted with 2-0 Vicryl.  The subcutaneous tissue was reapproximated well coapted with 3-0 Vicryl and the skin was then reapproximated well coapted with 4-0 nylon horizontal mattress type stitching.  A postoperative dressing was then applied consisting of Xeroform followed by 4 x 4 gauze, ABD, Kerlix, web roll, posterior splint, Ace wrap.  The pneumatic thigh tourniquet was deflated and a prompt hyperemic response was noted all digits of right foot.  The patient tolerated the procedure and anesthesia well was transferred to recovery room vital signs stable.  Following.  Postoperative monitoring patient be discharged home the following written oral postop instructions that were dispensed to the patient: Remain nonweightbearing to the right foot at all times, take postop pain medication, antibiotics, antinausea medicine, and blood thinner medication as prescribed, ice elevate right lower extremity when at rest, perform knee flexion extension exercises as well as calf massages to avoid DVT formations further.  COMPLICATIONS: None  CONDITION: Good, stable  Caroline More, DPM

## 2019-02-19 NOTE — H&P (Signed)
HISTORY AND PHYSICAL INTERVAL NOTE:  02/19/2019  7:16 AM  [name]  has presented today for surgery, with the diagnosis of S86.111D POSTERIOR TIBIAL TENDON TEAR RIGHT.  The various methods of treatment have been discussed with the patient.  No guarantees were given.  After consideration of risks, benefits and other options for treatment, the patient has consented to surgery.  I have reviewed the patients' chart and labs.    PROCEDURE:  1. RIGHT POSTERIOR TIBIAL TENDON REPAIR WITH USE OF GRAFT MATERIAL 2. FLEXOR DIGITORUM LONGUS TRANSFER  A history and physical examination was performed in my office.  The patient was reexamined.  There have been no changes to this history and physical examination.  Caroline More, DPM

## 2019-02-19 NOTE — Anesthesia Procedure Notes (Signed)
Anesthesia Regional Block: Popliteal block   Pre-Anesthetic Checklist: ,, timeout performed, Correct Patient, Correct Site, Correct Laterality, Correct Procedure, Correct Position, site marked, Risks and benefits discussed,  Surgical consent,  Pre-op evaluation,  At surgeon's request and post-op pain management  Laterality: Lower and Right  Prep: chloraprep       Needles:  Injection technique: Single-shot  Needle Type: Echogenic Stimulator Needle     Needle Length: 9cm  Needle Gauge: 21     Additional Needles:   Procedures:,,,, ultrasound used (permanent image in chart),,,,  Narrative:  Start time: 02/19/2019 11:50 AM End time: 02/19/2019 12:00 PM Injection made incrementally with aspirations every 5 mL.  Performed by: Personally  Anesthesiologist: Piscitello, Precious Haws, MD  Additional Notes: Patient consented for risk and benefits of nerve block including but not limited to nerve damage, failed block, bleeding and infection.  Patient voiced understanding.  Functioning IV was confirmed and monitors were applied.  A echogenic needle was used. Sterile prep,hand hygiene and sterile gloves were used. Minimal sedation used for procedure.   No paresthesia endorsed by patient during the procedure.  Negative aspiration and negative test dose prior to incremental administration of local anesthetic. The patient tolerated the procedure well with no immediate complications.

## 2019-02-19 NOTE — Anesthesia Procedure Notes (Signed)
Procedure Name: Intubation Date/Time: 02/19/2019 7:40 AM Performed by: Chanetta Marshall, CRNA Pre-anesthesia Checklist: Patient identified, Emergency Drugs available, Suction available and Patient being monitored Patient Re-evaluated:Patient Re-evaluated prior to induction Oxygen Delivery Method: Circle system utilized Preoxygenation: Pre-oxygenation with 100% oxygen Induction Type: IV induction Ventilation: Mask ventilation without difficulty Laryngoscope Size: McGraph and 2 Grade View: Grade I Tube type: Oral Number of attempts: 1 Airway Equipment and Method: Stylet,  Oral airway and Video-laryngoscopy Placement Confirmation: ETT inserted through vocal cords under direct vision,  positive ETCO2,  breath sounds checked- equal and bilateral and CO2 detector Secured at: 21 cm Tube secured with: Tape Dental Injury: Teeth and Oropharynx as per pre-operative assessment

## 2019-02-24 NOTE — Anesthesia Postprocedure Evaluation (Signed)
Anesthesia Post Note  Patient: [name]  Procedure(s) Performed: FDL TRANSFER;DEEP RIGHT WITH FLURORSCOPY AND SPLINT (Right ) FLEXOR TENDON REPAIR-SECOND RIGHT (Right )  Patient location during evaluation: PACU Anesthesia Type: General Level of consciousness: awake and alert Pain management: pain level controlled Vital Signs Assessment: post-procedure vital signs reviewed and stable Respiratory status: spontaneous breathing, nonlabored ventilation, respiratory function stable and patient connected to nasal cannula oxygen Cardiovascular status: blood pressure returned to baseline and stable Postop Assessment: no apparent nausea or vomiting Anesthetic complications: no     Last Vitals:  Vitals:   02/19/19 1245 02/19/19 1400  BP: 119/87 112/78  Pulse: (!) 103 100  Resp: 16 17  Temp: 36.6 C 36.4 C  SpO2: 100% 98%    Last Pain:  Vitals:   02/19/19 1400  TempSrc:   PainSc: [place] [name]

## 2019-03-23 ENCOUNTER — Telehealth
Admit: 2019-03-23 | Discharge: 2019-03-24 | Attending: Student in an Organized Health Care Education/Training Program | Primary: Student in an Organized Health Care Education/Training Program

## 2019-04-19 MED ORDER — GABAPENTIN 300 MG CAPSULE
ORAL_CAPSULE | Freq: Every day | ORAL | 1 refills | 90 days | Status: CP
Start: 2019-04-19 — End: ?

## 2019-05-05 MED ORDER — GABAPENTIN 300 MG CAPSULE
ORAL_CAPSULE | Freq: Every day | ORAL | 1 refills | 90 days | Status: CP
Start: 2019-05-05 — End: ?

## 2019-06-08 ENCOUNTER — Telehealth
Admit: 2019-06-08 | Discharge: 2019-06-09 | Attending: Student in an Organized Health Care Education/Training Program | Primary: Student in an Organized Health Care Education/Training Program

## 2019-06-08 DIAGNOSIS — G4700 Insomnia, unspecified: Principal | ICD-10-CM

## 2019-06-08 DIAGNOSIS — F3341 Major depressive disorder, recurrent, in partial remission: Principal | ICD-10-CM

## 2019-06-08 DIAGNOSIS — F419 Anxiety disorder, unspecified: Principal | ICD-10-CM

## 2019-06-08 DIAGNOSIS — Z79899 Other long term (current) drug therapy: Principal | ICD-10-CM

## 2019-06-08 MED ORDER — DULOXETINE 60 MG CAPSULE,DELAYED RELEASE
ORAL_CAPSULE | Freq: Every day | ORAL | 0 refills | 90 days | Status: CP
Start: 2019-06-08 — End: ?

## 2019-06-08 MED ORDER — QUETIAPINE 25 MG TABLET
ORAL_TABLET | 0 refills | 0 days | Status: CP
Start: 2019-06-08 — End: ?

## 2019-06-18 MED ORDER — ARIPIPRAZOLE 2 MG TABLET
ORAL_TABLET | Freq: Every day | ORAL | 2 refills | 30 days | Status: CP
Start: 2019-06-18 — End: 2019-07-18

## 2019-06-30 DIAGNOSIS — I10 Essential (primary) hypertension: Principal | ICD-10-CM

## 2019-06-30 MED ORDER — CHLORTHALIDONE 25 MG TABLET
ORAL_TABLET | 1 refills | 0 days | Status: CP
Start: 2019-06-30 — End: ?

## 2019-07-27 ENCOUNTER — Telehealth
Admit: 2019-07-27 | Discharge: 2019-07-28 | Attending: Student in an Organized Health Care Education/Training Program | Primary: Student in an Organized Health Care Education/Training Program

## 2019-07-27 DIAGNOSIS — Z79899 Other long term (current) drug therapy: Principal | ICD-10-CM

## 2019-07-27 DIAGNOSIS — F3341 Major depressive disorder, recurrent, in partial remission: Principal | ICD-10-CM

## 2019-07-27 DIAGNOSIS — F419 Anxiety disorder, unspecified: Principal | ICD-10-CM

## 2019-07-27 DIAGNOSIS — G4700 Insomnia, unspecified: Principal | ICD-10-CM

## 2019-08-09 MED ORDER — BUPROPION HCL XL 150 MG 24 HR TABLET, EXTENDED RELEASE
ORAL_TABLET | Freq: Every day | ORAL | 0 refills | 30.00000 days | Status: CP
Start: 2019-08-09 — End: ?

## 2019-08-17 ENCOUNTER — Telehealth
Admit: 2019-08-17 | Discharge: 2019-08-18 | Attending: Student in an Organized Health Care Education/Training Program | Primary: Student in an Organized Health Care Education/Training Program

## 2019-08-17 DIAGNOSIS — Z79899 Other long term (current) drug therapy: Principal | ICD-10-CM

## 2019-08-17 DIAGNOSIS — F3341 Major depressive disorder, recurrent, in partial remission: Principal | ICD-10-CM

## 2019-08-17 DIAGNOSIS — G4710 Hypersomnia, unspecified: Principal | ICD-10-CM

## 2019-08-17 DIAGNOSIS — F419 Anxiety disorder, unspecified: Principal | ICD-10-CM

## 2019-08-31 ENCOUNTER — Telehealth
Admit: 2019-08-31 | Discharge: 2019-09-01 | Attending: Student in an Organized Health Care Education/Training Program | Primary: Student in an Organized Health Care Education/Training Program

## 2019-08-31 MED ORDER — RISPERIDONE 0.5 MG TABLET
ORAL_TABLET | Freq: Every evening | ORAL | 0 refills | 30.00000 days | Status: CP
Start: 2019-08-31 — End: 2019-09-30

## 2019-09-06 MED ORDER — DULOXETINE 30 MG CAPSULE,DELAYED RELEASE
ORAL_CAPSULE | Freq: Every day | ORAL | 2 refills | 30.00000 days | Status: CP
Start: 2019-09-06 — End: ?

## 2019-09-16 ENCOUNTER — Telehealth
Admit: 2019-09-16 | Discharge: 2019-09-17 | Payer: PRIVATE HEALTH INSURANCE | Attending: Student in an Organized Health Care Education/Training Program | Primary: Student in an Organized Health Care Education/Training Program

## 2019-09-16 MED ORDER — QUETIAPINE 25 MG TABLET
ORAL_TABLET | Freq: Every day | ORAL | 0 refills | 15.00000 days | Status: CP | PRN
Start: 2019-09-16 — End: ?

## 2019-09-24 MED ORDER — RISPERIDONE 1 MG TABLET
ORAL_TABLET | Freq: Every evening | ORAL | 0 refills | 90 days | Status: CP
Start: 2019-09-24 — End: 2019-12-23

## 2019-09-28 ENCOUNTER — Ambulatory Visit
Admit: 2019-09-28 | Discharge: 2019-09-29 | Payer: PRIVATE HEALTH INSURANCE | Attending: Family Medicine | Primary: Family Medicine

## 2019-09-28 MED ORDER — SUMATRIPTAN 50 MG TABLET
ORAL_TABLET | 11 refills | 0 days | Status: CP
Start: 2019-09-28 — End: ?

## 2019-09-28 MED ORDER — CHLORTHALIDONE 25 MG TABLET
ORAL_TABLET | Freq: Every day | ORAL | 3 refills | 90 days | Status: CP
Start: 2019-09-28 — End: ?

## 2019-10-01 DIAGNOSIS — B9689 Other specified bacterial agents as the cause of diseases classified elsewhere: Principal | ICD-10-CM

## 2019-10-01 DIAGNOSIS — N760 Acute vaginitis: Principal | ICD-10-CM

## 2019-10-01 MED ORDER — DULOXETINE 30 MG CAPSULE,DELAYED RELEASE
ORAL_CAPSULE | Freq: Every day | ORAL | 0 refills | 90.00000 days | Status: CP
Start: 2019-10-01 — End: ?

## 2019-10-01 MED ORDER — DULOXETINE 60 MG CAPSULE,DELAYED RELEASE
ORAL_CAPSULE | Freq: Every day | ORAL | 0 refills | 90 days | Status: CP
Start: 2019-10-01 — End: ?

## 2019-10-01 MED ORDER — METRONIDAZOLE 500 MG TABLET
ORAL_TABLET | Freq: Two times a day (BID) | ORAL | 0 refills | 7 days | Status: CP
Start: 2019-10-01 — End: 2019-10-08

## 2019-10-20 ENCOUNTER — Other Ambulatory Visit: Admit: 2019-10-20 | Discharge: 2019-10-21 | Payer: PRIVATE HEALTH INSURANCE

## 2019-10-20 DIAGNOSIS — E876 Hypokalemia: Principal | ICD-10-CM

## 2019-11-04 ENCOUNTER — Telehealth
Admit: 2019-11-04 | Discharge: 2019-11-05 | Payer: PRIVATE HEALTH INSURANCE | Attending: Student in an Organized Health Care Education/Training Program | Primary: Student in an Organized Health Care Education/Training Program

## 2019-11-04 ENCOUNTER — Ambulatory Visit
Admit: 2019-11-04 | Discharge: 2019-11-15 | Disposition: A | Discharge status: Home / Self Care | Payer: PRIVATE HEALTH INSURANCE

## 2019-11-04 DIAGNOSIS — F419 Anxiety disorder, unspecified: Principal | ICD-10-CM

## 2019-11-04 DIAGNOSIS — F3341 Major depressive disorder, recurrent, in partial remission: Principal | ICD-10-CM

## 2019-11-04 DIAGNOSIS — G4710 Hypersomnia, unspecified: Principal | ICD-10-CM

## 2019-11-04 DIAGNOSIS — Z79899 Other long term (current) drug therapy: Principal | ICD-10-CM

## 2019-11-15 MED ORDER — TRAZODONE 100 MG TABLET
ORAL_TABLET | Freq: Every evening | ORAL | 0 refills | 30 days | Status: CP | PRN
Start: 2019-11-15 — End: 2019-12-15

## 2019-11-15 MED ORDER — HYDROXYZINE HCL 25 MG TABLET
Freq: Four times a day (QID) | ORAL | 0 refills | 8 days | Status: CP | PRN
Start: 2019-11-15 — End: ?

## 2019-11-16 MED ORDER — LIOTHYRONINE 50 MCG TABLET
ORAL_TABLET | Freq: Every day | ORAL | 0 refills | 30.00000 days | Status: CP
Start: 2019-11-16 — End: 2019-12-16

## 2019-11-16 MED ORDER — AMLODIPINE 10 MG TABLET
ORAL_TABLET | Freq: Every day | ORAL | 0 refills | 30.00000 days | Status: CP
Start: 2019-11-16 — End: 2019-12-16

## 2019-11-16 MED ORDER — POTASSIUM CHLORIDE 20 MEQ ORAL PACKET
PACK | Freq: Every day | ORAL | 0 refills | 30 days | Status: CP
Start: 2019-11-16 — End: 2019-12-16

## 2019-11-17 ENCOUNTER — Encounter: Admit: 2019-11-17 | Discharge: 2019-11-18 | Payer: PRIVATE HEALTH INSURANCE

## 2019-11-19 ENCOUNTER — Encounter
Admit: 2019-11-19 | Discharge: 2019-11-20 | Payer: PRIVATE HEALTH INSURANCE | Attending: Student in an Organized Health Care Education/Training Program | Primary: Student in an Organized Health Care Education/Training Program

## 2019-11-19 DIAGNOSIS — G4710 Hypersomnia, unspecified: Principal | ICD-10-CM

## 2019-11-19 DIAGNOSIS — F3341 Major depressive disorder, recurrent, in partial remission: Principal | ICD-10-CM

## 2019-11-19 MED ORDER — LIDOCAINE HCL 2 % MUCOSAL SOLUTION
Freq: Two times a day (BID) | OROMUCOSAL | 0 refills | 3 days | Status: CP | PRN
Start: 2019-11-19 — End: ?

## 2019-11-19 MED ORDER — TRAZODONE 150 MG TABLET
ORAL_TABLET | Freq: Every evening | ORAL | 0 refills | 30.00000 days | Status: CP
Start: 2019-11-19 — End: 2019-12-19

## 2019-11-29 DIAGNOSIS — R928 Other abnormal and inconclusive findings on diagnostic imaging of breast: Principal | ICD-10-CM

## 2019-12-15 ENCOUNTER — Ambulatory Visit: Admit: 2019-12-15 | Discharge: 2019-12-16 | Payer: PRIVATE HEALTH INSURANCE

## 2019-12-21 ENCOUNTER — Encounter: Admit: 2019-12-21 | Discharge: 2019-12-22 | Payer: PRIVATE HEALTH INSURANCE

## 2019-12-21 DIAGNOSIS — I10 Essential (primary) hypertension: Principal | ICD-10-CM

## 2019-12-21 DIAGNOSIS — G8929 Other chronic pain: Principal | ICD-10-CM

## 2019-12-21 DIAGNOSIS — M25512 Pain in left shoulder: Secondary | ICD-10-CM

## 2019-12-21 DIAGNOSIS — M5412 Radiculopathy, cervical region: Principal | ICD-10-CM

## 2019-12-21 MED ORDER — AMLODIPINE 10 MG TABLET
ORAL_TABLET | Freq: Every day | ORAL | 11 refills | 30 days | Status: CP
Start: 2019-12-21 — End: 2020-01-20

## 2019-12-23 ENCOUNTER — Telehealth
Admit: 2019-12-23 | Discharge: 2019-12-24 | Payer: PRIVATE HEALTH INSURANCE | Attending: Student in an Organized Health Care Education/Training Program | Primary: Student in an Organized Health Care Education/Training Program

## 2019-12-23 MED ORDER — TRAZODONE 50 MG TABLET
ORAL_TABLET | Freq: Every evening | ORAL | 2 refills | 30 days | Status: CP
Start: 2019-12-23 — End: ?

## 2020-01-18 ENCOUNTER — Ambulatory Visit
Admit: 2020-01-18 | Discharge: 2020-01-19 | Payer: PRIVATE HEALTH INSURANCE | Attending: Family Medicine | Primary: Family Medicine

## 2020-01-18 DIAGNOSIS — R400 Somnolence: Principal | ICD-10-CM

## 2020-01-18 DIAGNOSIS — E876 Hypokalemia: Principal | ICD-10-CM

## 2020-01-18 DIAGNOSIS — I10 Essential (primary) hypertension: Principal | ICD-10-CM

## 2020-01-18 DIAGNOSIS — R238 Other skin changes: Principal | ICD-10-CM

## 2020-01-18 DIAGNOSIS — M25512 Pain in left shoulder: Principal | ICD-10-CM

## 2020-01-18 DIAGNOSIS — K149 Disease of tongue, unspecified: Principal | ICD-10-CM

## 2020-01-18 DIAGNOSIS — G8929 Other chronic pain: Principal | ICD-10-CM

## 2020-01-18 DIAGNOSIS — F332 Major depressive disorder, recurrent severe without psychotic features: Principal | ICD-10-CM

## 2020-01-18 DIAGNOSIS — M7989 Other specified soft tissue disorders: Principal | ICD-10-CM

## 2020-01-18 DIAGNOSIS — R0683 Snoring: Principal | ICD-10-CM

## 2020-01-18 DIAGNOSIS — R7309 Other abnormal glucose: Principal | ICD-10-CM

## 2020-01-18 MED ORDER — LIDOCAINE HCL 2 % MUCOSAL SOLUTION
Freq: Two times a day (BID) | OROMUCOSAL | 11 refills | 3 days | Status: CP | PRN
Start: 2020-01-18 — End: ?

## 2020-01-21 MED ORDER — DULOXETINE 30 MG CAPSULE,DELAYED RELEASE
ORAL_CAPSULE | Freq: Every day | ORAL | 1 refills | 90.00000 days | Status: CP
Start: 2020-01-21 — End: 2020-03-16

## 2020-01-21 MED ORDER — DULOXETINE 60 MG CAPSULE,DELAYED RELEASE
ORAL_CAPSULE | Freq: Every day | ORAL | 1 refills | 90.00000 days | Status: CP
Start: 2020-01-21 — End: 2020-03-16

## 2020-01-25 DIAGNOSIS — R7401 Elevated transaminase level: Principal | ICD-10-CM

## 2020-01-25 DIAGNOSIS — D696 Thrombocytopenia, unspecified: Principal | ICD-10-CM

## 2020-01-26 ENCOUNTER — Other Ambulatory Visit: Admit: 2020-01-26 | Discharge: 2020-01-27 | Payer: PRIVATE HEALTH INSURANCE

## 2020-01-26 DIAGNOSIS — D696 Thrombocytopenia, unspecified: Principal | ICD-10-CM

## 2020-01-26 DIAGNOSIS — R7401 Elevated transaminase level: Principal | ICD-10-CM

## 2020-02-03 ENCOUNTER — Encounter
Admit: 2020-02-03 | Discharge: 2020-02-04 | Payer: PRIVATE HEALTH INSURANCE | Attending: Student in an Organized Health Care Education/Training Program | Primary: Student in an Organized Health Care Education/Training Program

## 2020-02-03 DIAGNOSIS — F419 Anxiety disorder, unspecified: Principal | ICD-10-CM

## 2020-02-03 DIAGNOSIS — F3341 Major depressive disorder, recurrent, in partial remission: Principal | ICD-10-CM

## 2020-02-03 DIAGNOSIS — G4700 Insomnia, unspecified: Principal | ICD-10-CM

## 2020-02-03 MED ORDER — BUSPIRONE 5 MG TABLET
ORAL_TABLET | Freq: Two times a day (BID) | ORAL | 0 refills | 30 days | Status: CP
Start: 2020-02-03 — End: 2020-03-16

## 2020-02-07 ENCOUNTER — Ambulatory Visit: Admit: 2020-02-07 | Discharge: 2020-02-08 | Payer: PRIVATE HEALTH INSURANCE

## 2020-02-07 DIAGNOSIS — R7401 Elevation of levels of liver transaminase levels: Principal | ICD-10-CM

## 2020-02-15 MED ORDER — FLUOXETINE 20 MG TABLET
ORAL_TABLET | Freq: Every day | ORAL | 2 refills | 30 days | Status: CP
Start: 2020-02-15 — End: 2020-03-16

## 2020-02-24 DIAGNOSIS — D696 Thrombocytopenia, unspecified: Principal | ICD-10-CM

## 2020-02-24 DIAGNOSIS — R197 Diarrhea, unspecified: Principal | ICD-10-CM

## 2020-02-24 DIAGNOSIS — R7401 Elevated transaminase level: Principal | ICD-10-CM

## 2020-02-24 DIAGNOSIS — R195 Other fecal abnormalities: Principal | ICD-10-CM

## 2020-03-08 ENCOUNTER — Other Ambulatory Visit: Admit: 2020-03-08 | Discharge: 2020-03-09 | Payer: PRIVATE HEALTH INSURANCE

## 2020-03-08 DIAGNOSIS — D696 Thrombocytopenia, unspecified: Principal | ICD-10-CM

## 2020-03-08 DIAGNOSIS — R7401 Elevation of levels of liver transaminase levels: Principal | ICD-10-CM

## 2020-03-09 ENCOUNTER — Encounter: Admit: 2020-03-09 | Discharge: 2020-03-10 | Payer: PRIVATE HEALTH INSURANCE

## 2020-03-09 DIAGNOSIS — Z6841 Body Mass Index (BMI) 40.0 and over, adult: Principal | ICD-10-CM

## 2020-03-09 DIAGNOSIS — R197 Diarrhea, unspecified: Principal | ICD-10-CM

## 2020-03-09 DIAGNOSIS — R112 Nausea with vomiting, unspecified: Principal | ICD-10-CM

## 2020-03-09 DIAGNOSIS — R7401 Elevation of levels of liver transaminase levels: Principal | ICD-10-CM

## 2020-03-09 DIAGNOSIS — R195 Other fecal abnormalities: Principal | ICD-10-CM

## 2020-03-09 DIAGNOSIS — K760 Fatty (change of) liver, not elsewhere classified: Principal | ICD-10-CM

## 2020-03-09 DIAGNOSIS — Z800 Family history of malignant neoplasm of digestive organs: Principal | ICD-10-CM

## 2020-03-10 DIAGNOSIS — Z6841 Body Mass Index (BMI) 40.0 and over, adult: Principal | ICD-10-CM

## 2020-03-10 DIAGNOSIS — I10 Essential (primary) hypertension: Principal | ICD-10-CM

## 2020-03-10 DIAGNOSIS — Z885 Allergy status to narcotic agent status: Principal | ICD-10-CM

## 2020-03-10 DIAGNOSIS — R112 Nausea with vomiting, unspecified: Principal | ICD-10-CM

## 2020-03-10 DIAGNOSIS — R197 Diarrhea, unspecified: Principal | ICD-10-CM

## 2020-03-10 DIAGNOSIS — R195 Other fecal abnormalities: Principal | ICD-10-CM

## 2020-03-10 MED ORDER — PEG 3350-ELECTROLYTES 236 GRAM-22.74 GRAM-6.74 GRAM-5.86 GRAM SOLUTION
Freq: Once | ORAL | 0 refills | 1.00000 days | Status: CP
Start: 2020-03-10 — End: 2020-03-16

## 2020-03-14 ENCOUNTER — Encounter
Admit: 2020-03-14 | Discharge: 2020-03-15 | Payer: PRIVATE HEALTH INSURANCE | Attending: Family Medicine | Primary: Family Medicine

## 2020-03-14 DIAGNOSIS — I10 Essential (primary) hypertension: Principal | ICD-10-CM

## 2020-03-15 MED FILL — PEG 3350-ELECTROLYTES 236 GRAM-22.74 GRAM-6.74 GRAM-5.86 GRAM SOLUTION: ORAL | 1 days supply | Qty: 4000 | Fill #0

## 2020-03-16 MED ORDER — FLUOXETINE 40 MG CAPSULE
ORAL_CAPSULE | Freq: Every day | ORAL | 0 refills | 90 days | Status: CP
Start: 2020-03-16 — End: 2021-03-16

## 2020-03-23 ENCOUNTER — Encounter
Admit: 2020-03-23 | Discharge: 2020-03-24 | Payer: PRIVATE HEALTH INSURANCE | Attending: Student in an Organized Health Care Education/Training Program | Primary: Student in an Organized Health Care Education/Training Program

## 2020-03-23 DIAGNOSIS — G4700 Insomnia, unspecified: Principal | ICD-10-CM

## 2020-03-23 DIAGNOSIS — F3341 Major depressive disorder, recurrent, in partial remission: Principal | ICD-10-CM

## 2020-03-23 DIAGNOSIS — F419 Anxiety disorder, unspecified: Principal | ICD-10-CM

## 2020-03-23 MED ORDER — TRAZODONE 50 MG TABLET
ORAL_TABLET | Freq: Every evening | ORAL | 0 refills | 90.00000 days | Status: CP
Start: 2020-03-23 — End: 2020-04-22

## 2020-04-19 ENCOUNTER — Encounter
Admit: 2020-04-19 | Discharge: 2020-04-19 | Payer: PRIVATE HEALTH INSURANCE | Attending: Anesthesiology | Primary: Anesthesiology

## 2020-04-19 ENCOUNTER — Encounter: Admit: 2020-04-19 | Discharge: 2020-04-19 | Payer: PRIVATE HEALTH INSURANCE

## 2020-04-27 ENCOUNTER — Encounter
Admit: 2020-04-27 | Discharge: 2020-04-28 | Payer: PRIVATE HEALTH INSURANCE | Attending: Family Medicine | Primary: Family Medicine

## 2020-04-27 DIAGNOSIS — Z6841 Body Mass Index (BMI) 40.0 and over, adult: Principal | ICD-10-CM

## 2020-04-27 MED ORDER — OZEMPIC 0.25 MG OR 0.5 MG (2 MG/1.5 ML) SUBCUTANEOUS PEN INJECTOR
SUBCUTANEOUS | 11 refills | 56 days | Status: CP
Start: 2020-04-27 — End: ?

## 2020-05-18 ENCOUNTER — Encounter
Admit: 2020-05-18 | Discharge: 2020-05-19 | Payer: PRIVATE HEALTH INSURANCE | Attending: Student in an Organized Health Care Education/Training Program | Primary: Student in an Organized Health Care Education/Training Program

## 2020-05-18 DIAGNOSIS — R251 Tremor, unspecified: Principal | ICD-10-CM

## 2020-05-18 MED ORDER — TRAZODONE 50 MG TABLET
ORAL_TABLET | 0 refills | 0 days | Status: CP
Start: 2020-05-18 — End: 2020-08-16

## 2020-06-22 MED ORDER — FLUOXETINE 40 MG CAPSULE
ORAL_CAPSULE | Freq: Every day | ORAL | 0 refills | 90 days | Status: CP
Start: 2020-06-22 — End: 2021-06-22

## 2020-06-26 DIAGNOSIS — Z6841 Body Mass Index (BMI) 40.0 and over, adult: Principal | ICD-10-CM

## 2020-06-26 MED ORDER — OZEMPIC 0.25 MG OR 0.5 MG (2 MG/1.5 ML) SUBCUTANEOUS PEN INJECTOR
SUBCUTANEOUS | 11 refills | 28 days | Status: CP
Start: 2020-06-26 — End: ?

## 2020-07-05 ENCOUNTER — Encounter: Admit: 2020-07-05 | Discharge: 2020-07-06 | Payer: PRIVATE HEALTH INSURANCE

## 2020-07-05 DIAGNOSIS — K7581 Nonalcoholic steatohepatitis (NASH): Principal | ICD-10-CM

## 2020-07-12 ENCOUNTER — Encounter: Admit: 2020-07-12 | Discharge: 2020-07-13 | Payer: PRIVATE HEALTH INSURANCE | Attending: Neurology | Primary: Neurology

## 2020-07-12 DIAGNOSIS — F959 Tic disorder, unspecified: Principal | ICD-10-CM

## 2020-07-12 DIAGNOSIS — R251 Tremor, unspecified: Principal | ICD-10-CM

## 2020-07-21 ENCOUNTER — Encounter
Admit: 2020-07-21 | Discharge: 2020-07-22 | Payer: PRIVATE HEALTH INSURANCE | Attending: Student in an Organized Health Care Education/Training Program | Primary: Student in an Organized Health Care Education/Training Program

## 2020-07-21 DIAGNOSIS — F419 Anxiety disorder, unspecified: Principal | ICD-10-CM

## 2020-07-21 DIAGNOSIS — F3341 Major depressive disorder, recurrent, in partial remission: Principal | ICD-10-CM

## 2020-07-25 DIAGNOSIS — G8929 Other chronic pain: Principal | ICD-10-CM

## 2020-07-25 DIAGNOSIS — M25512 Pain in left shoulder: Principal | ICD-10-CM

## 2020-08-01 MED ORDER — OZEMPIC 0.25 MG OR 0.5 MG (2 MG/1.5 ML) SUBCUTANEOUS PEN INJECTOR
SUBCUTANEOUS | 3 refills | 84 days | Status: CP
Start: 2020-08-01 — End: ?

## 2020-08-15 ENCOUNTER — Encounter
Admit: 2020-08-15 | Discharge: 2020-08-16 | Payer: PRIVATE HEALTH INSURANCE | Attending: Rehabilitative and Restorative Service Providers" | Primary: Rehabilitative and Restorative Service Providers"

## 2020-08-15 ENCOUNTER — Encounter: Admit: 2020-08-15 | Discharge: 2020-08-16 | Payer: PRIVATE HEALTH INSURANCE

## 2020-08-15 MED ORDER — DOXYCYCLINE HYCLATE 100 MG TABLET,DELAYED RELEASE
ORAL_TABLET | Freq: Two times a day (BID) | ORAL | 0 refills | 10 days | Status: CP
Start: 2020-08-15 — End: 2020-08-15

## 2020-08-15 MED ORDER — DICLOFENAC 1 % TOPICAL GEL
Freq: Four times a day (QID) | TOPICAL | 1 refills | 30 days | Status: CP
Start: 2020-08-15 — End: 2020-10-14

## 2020-08-25 ENCOUNTER — Encounter: Admit: 2020-08-25 | Discharge: 2020-08-26 | Payer: PRIVATE HEALTH INSURANCE | Attending: Clinical | Primary: Clinical

## 2020-08-25 DIAGNOSIS — F3341 Major depressive disorder, recurrent, in partial remission: Principal | ICD-10-CM

## 2020-08-25 DIAGNOSIS — F419 Anxiety disorder, unspecified: Principal | ICD-10-CM

## 2020-08-29 ENCOUNTER — Encounter: Admit: 2020-08-29 | Discharge: 2020-08-30 | Payer: PRIVATE HEALTH INSURANCE | Attending: Clinical | Primary: Clinical

## 2020-08-29 DIAGNOSIS — F332 Major depressive disorder, recurrent severe without psychotic features: Principal | ICD-10-CM

## 2020-08-29 DIAGNOSIS — F419 Anxiety disorder, unspecified: Principal | ICD-10-CM

## 2020-09-06 ENCOUNTER — Encounter: Admit: 2020-09-06 | Discharge: 2020-09-07 | Payer: PRIVATE HEALTH INSURANCE

## 2020-09-08 ENCOUNTER — Encounter: Admit: 2020-09-08 | Discharge: 2020-09-09 | Payer: PRIVATE HEALTH INSURANCE | Attending: Clinical | Primary: Clinical

## 2020-09-08 ENCOUNTER — Encounter
Admit: 2020-09-08 | Discharge: 2020-09-09 | Payer: PRIVATE HEALTH INSURANCE | Attending: Rehabilitative and Restorative Service Providers" | Primary: Rehabilitative and Restorative Service Providers"

## 2020-09-08 DIAGNOSIS — F419 Anxiety disorder, unspecified: Principal | ICD-10-CM

## 2020-09-08 DIAGNOSIS — F332 Major depressive disorder, recurrent severe without psychotic features: Principal | ICD-10-CM

## 2020-09-08 MED ORDER — ETODOLAC 400 MG TABLET
ORAL_TABLET | Freq: Two times a day (BID) | ORAL | 11 refills | 30 days | Status: CP
Start: 2020-09-08 — End: 2021-09-08

## 2020-09-12 ENCOUNTER — Encounter: Admit: 2020-09-12 | Discharge: 2020-09-13 | Payer: PRIVATE HEALTH INSURANCE

## 2020-09-12 ENCOUNTER — Encounter: Admit: 2020-09-12 | Discharge: 2020-09-13 | Payer: PRIVATE HEALTH INSURANCE | Attending: Clinical | Primary: Clinical

## 2020-09-12 DIAGNOSIS — F332 Major depressive disorder, recurrent severe without psychotic features: Principal | ICD-10-CM

## 2020-09-12 DIAGNOSIS — F419 Anxiety disorder, unspecified: Principal | ICD-10-CM

## 2020-09-13 DIAGNOSIS — Z01812 Encounter for preprocedural laboratory examination: Principal | ICD-10-CM

## 2020-09-13 DIAGNOSIS — S46002A Unspecified injury of muscle(s) and tendon(s) of the rotator cuff of left shoulder, initial encounter: Principal | ICD-10-CM

## 2020-09-19 ENCOUNTER — Encounter
Admit: 2020-09-19 | Discharge: 2020-09-19 | Payer: PRIVATE HEALTH INSURANCE | Attending: Student in an Organized Health Care Education/Training Program | Primary: Student in an Organized Health Care Education/Training Program

## 2020-09-19 ENCOUNTER — Telehealth: Admit: 2020-09-19 | Discharge: 2020-09-19 | Payer: PRIVATE HEALTH INSURANCE | Attending: Clinical | Primary: Clinical

## 2020-09-19 DIAGNOSIS — F419 Anxiety disorder, unspecified: Principal | ICD-10-CM

## 2020-09-19 DIAGNOSIS — F332 Major depressive disorder, recurrent severe without psychotic features: Principal | ICD-10-CM

## 2020-09-19 DIAGNOSIS — G4700 Insomnia, unspecified: Principal | ICD-10-CM

## 2020-09-19 MED ORDER — FLUOXETINE 40 MG CAPSULE
ORAL_CAPSULE | Freq: Every day | ORAL | 0 refills | 90 days | Status: CP
Start: 2020-09-19 — End: 2021-09-19

## 2020-09-19 MED ORDER — TRAZODONE 50 MG TABLET
ORAL_TABLET | 0 refills | 0 days | Status: CP
Start: 2020-09-19 — End: 2020-12-18

## 2020-09-20 ENCOUNTER — Encounter: Admit: 2020-09-20 | Discharge: 2020-09-21 | Payer: PRIVATE HEALTH INSURANCE

## 2020-09-20 DIAGNOSIS — S46002A Unspecified injury of muscle(s) and tendon(s) of the rotator cuff of left shoulder, initial encounter: Principal | ICD-10-CM

## 2020-09-20 DIAGNOSIS — Z01812 Encounter for preprocedural laboratory examination: Principal | ICD-10-CM

## 2020-09-25 ENCOUNTER — Encounter
Admit: 2020-09-25 | Discharge: 2020-09-25 | Payer: PRIVATE HEALTH INSURANCE | Attending: Anesthesiology | Primary: Anesthesiology

## 2020-09-25 ENCOUNTER — Encounter: Admit: 2020-09-25 | Discharge: 2020-09-25 | Payer: PRIVATE HEALTH INSURANCE

## 2020-09-25 ENCOUNTER — Encounter: Admit: 2020-09-25 | Discharge: 2020-09-26 | Payer: PRIVATE HEALTH INSURANCE

## 2020-09-25 MED ORDER — ACETAMINOPHEN 500 MG TABLET
ORAL_TABLET | Freq: Three times a day (TID) | ORAL | 0 refills | 14.00000 days | Status: CP
Start: 2020-09-25 — End: 2020-10-09

## 2020-09-25 MED ORDER — OXYCODONE 5 MG TABLET
ORAL_TABLET | ORAL | 0 refills | 3.00000 days | Status: CP | PRN
Start: 2020-09-25 — End: 2020-09-30
  Filled 2020-09-25: qty 30, 3d supply, fill #0

## 2020-09-25 MED ORDER — ASPIRIN 81 MG TABLET,DELAYED RELEASE
ORAL_TABLET | Freq: Every day | ORAL | 0 refills | 30 days | Status: CP
Start: 2020-09-25 — End: 2020-10-25
  Filled 2020-09-25: qty 30, 30d supply, fill #0
  Filled 2020-09-25: qty 84, 14d supply, fill #0

## 2020-09-25 MED ORDER — DOCUSATE SODIUM 100 MG CAPSULE
ORAL_CAPSULE | Freq: Two times a day (BID) | ORAL | 0 refills | 10 days | Status: CP
Start: 2020-09-25 — End: 2020-10-05
  Filled 2020-09-25: qty 20, 10d supply, fill #0

## 2020-09-25 MED ORDER — ONDANSETRON HCL 4 MG TABLET
ORAL_TABLET | Freq: Three times a day (TID) | ORAL | 0 refills | 4 days | Status: CP | PRN
Start: 2020-09-25 — End: 2020-10-25
  Filled 2020-09-25: qty 10, 3d supply, fill #0

## 2020-09-25 MED ORDER — GABAPENTIN 100 MG CAPSULE
ORAL_CAPSULE | Freq: Three times a day (TID) | ORAL | 0 refills | 5.00000 days | Status: CP
Start: 2020-09-25 — End: 2020-09-30
  Filled 2020-09-25: qty 15, 5d supply, fill #0

## 2020-09-26 ENCOUNTER — Encounter: Admit: 2020-09-26 | Discharge: 2020-09-27 | Payer: PRIVATE HEALTH INSURANCE | Attending: Clinical | Primary: Clinical

## 2020-09-26 DIAGNOSIS — F419 Anxiety disorder, unspecified: Principal | ICD-10-CM

## 2020-09-26 DIAGNOSIS — F332 Major depressive disorder, recurrent severe without psychotic features: Principal | ICD-10-CM

## 2020-10-03 DIAGNOSIS — G8918 Other acute postprocedural pain: Principal | ICD-10-CM

## 2020-10-03 DIAGNOSIS — M25519 Pain in unspecified shoulder: Principal | ICD-10-CM

## 2020-10-03 MED ORDER — GABAPENTIN 100 MG CAPSULE
ORAL_CAPSULE | Freq: Three times a day (TID) | ORAL | 0 refills | 10 days | Status: CP
Start: 2020-10-03 — End: 2020-10-13

## 2020-10-10 ENCOUNTER — Ambulatory Visit: Admit: 2020-10-10 | Discharge: 2020-10-11 | Payer: PRIVATE HEALTH INSURANCE

## 2020-10-10 DIAGNOSIS — G8918 Other acute postprocedural pain: Principal | ICD-10-CM

## 2020-10-10 DIAGNOSIS — M25519 Pain in unspecified shoulder: Principal | ICD-10-CM

## 2020-10-10 MED ORDER — GABAPENTIN 100 MG CAPSULE
ORAL_CAPSULE | Freq: Three times a day (TID) | ORAL | 0 refills | 14 days | Status: CP
Start: 2020-10-10 — End: 2020-10-24

## 2020-10-10 MED ORDER — OXYCODONE 5 MG TABLET
ORAL_TABLET | Freq: Four times a day (QID) | ORAL | 0 refills | 4 days | Status: CP | PRN
Start: 2020-10-10 — End: 2020-10-15

## 2020-10-17 ENCOUNTER — Telehealth: Admit: 2020-10-17 | Discharge: 2020-10-18 | Payer: PRIVATE HEALTH INSURANCE | Attending: Clinical | Primary: Clinical

## 2020-10-17 DIAGNOSIS — F419 Anxiety disorder, unspecified: Principal | ICD-10-CM

## 2020-10-17 DIAGNOSIS — F332 Major depressive disorder, recurrent severe without psychotic features: Principal | ICD-10-CM

## 2020-10-24 DIAGNOSIS — I10 Essential (primary) hypertension: Principal | ICD-10-CM

## 2020-10-24 MED ORDER — TRAMADOL 50 MG TABLET
ORAL_TABLET | Freq: Four times a day (QID) | ORAL | 0 refills | 5 days | Status: CP | PRN
Start: 2020-10-24 — End: 2021-10-24

## 2020-10-24 MED ORDER — AMLODIPINE 10 MG TABLET
ORAL_TABLET | Freq: Every day | ORAL | 3 refills | 90 days | Status: CP
Start: 2020-10-24 — End: 2021-10-24

## 2020-10-24 MED ORDER — GABAPENTIN 100 MG CAPSULE
ORAL_CAPSULE | Freq: Three times a day (TID) | ORAL | 3 refills | 90 days | Status: CP
Start: 2020-10-24 — End: 2021-10-24

## 2020-10-26 MED ORDER — OXYCODONE 5 MG TABLET
ORAL_TABLET | ORAL | 0 refills | 4 days | Status: CP | PRN
Start: 2020-10-26 — End: ?

## 2020-10-30 ENCOUNTER — Telehealth: Admit: 2020-10-30 | Discharge: 2020-10-31 | Payer: PRIVATE HEALTH INSURANCE | Attending: Clinical | Primary: Clinical

## 2020-10-30 DIAGNOSIS — F332 Major depressive disorder, recurrent severe without psychotic features: Principal | ICD-10-CM

## 2020-10-30 DIAGNOSIS — F419 Anxiety disorder, unspecified: Principal | ICD-10-CM

## 2020-11-02 DIAGNOSIS — I10 Essential (primary) hypertension: Principal | ICD-10-CM

## 2020-11-02 MED ORDER — AMLODIPINE 10 MG TABLET
ORAL_TABLET | Freq: Every day | ORAL | 3 refills | 90.00000 days | Status: CP
Start: 2020-11-02 — End: 2021-11-02

## 2020-11-02 MED ORDER — SUMATRIPTAN 50 MG TABLET
ORAL_TABLET | ORAL | 11 refills | 1 days | Status: CP | PRN
Start: 2020-11-02 — End: 2021-11-02

## 2020-11-07 ENCOUNTER — Encounter: Admit: 2020-11-07 | Discharge: 2020-11-08 | Payer: PRIVATE HEALTH INSURANCE

## 2020-11-07 MED ORDER — OXYCODONE 5 MG TABLET
ORAL_TABLET | ORAL | 0 refills | 2 days | Status: CP | PRN
Start: 2020-11-07 — End: ?

## 2020-11-13 ENCOUNTER — Telehealth: Admit: 2020-11-13 | Discharge: 2020-11-14 | Payer: PRIVATE HEALTH INSURANCE | Attending: Clinical | Primary: Clinical

## 2020-11-13 DIAGNOSIS — F419 Anxiety disorder, unspecified: Principal | ICD-10-CM

## 2020-11-13 DIAGNOSIS — F332 Major depressive disorder, recurrent severe without psychotic features: Principal | ICD-10-CM

## 2020-12-05 ENCOUNTER — Ambulatory Visit: Admit: 2020-12-05 | Discharge: 2020-12-06 | Payer: PRIVATE HEALTH INSURANCE

## 2020-12-05 DIAGNOSIS — F332 Major depressive disorder, recurrent severe without psychotic features: Principal | ICD-10-CM

## 2020-12-05 DIAGNOSIS — I10 Essential (primary) hypertension: Principal | ICD-10-CM

## 2020-12-05 DIAGNOSIS — Z713 Dietary counseling and surveillance: Principal | ICD-10-CM

## 2020-12-05 DIAGNOSIS — Z0000 Encounter for general adult medical examination without abnormal findings: Principal | ICD-10-CM

## 2020-12-05 DIAGNOSIS — Z6841 Body Mass Index (BMI) 40.0 and over, adult: Principal | ICD-10-CM

## 2020-12-05 DIAGNOSIS — K7460 Unspecified cirrhosis of liver: Principal | ICD-10-CM

## 2020-12-05 DIAGNOSIS — J4520 Mild intermittent asthma, uncomplicated: Principal | ICD-10-CM

## 2020-12-05 MED ORDER — ALBUTEROL SULFATE HFA 90 MCG/ACTUATION AEROSOL INHALER
Freq: Four times a day (QID) | RESPIRATORY_TRACT | 0 refills | 0.00000 days | Status: CP | PRN
Start: 2020-12-05 — End: 2021-12-05

## 2020-12-05 MED ORDER — OZEMPIC 0.25 MG OR 0.5 MG (2 MG/1.5 ML) SUBCUTANEOUS PEN INJECTOR
SUBCUTANEOUS | 3 refills | 84.00000 days | Status: CP
Start: 2020-12-05 — End: ?

## 2020-12-06 DIAGNOSIS — F419 Anxiety disorder, unspecified: Principal | ICD-10-CM

## 2020-12-06 DIAGNOSIS — F332 Major depressive disorder, recurrent severe without psychotic features: Principal | ICD-10-CM

## 2020-12-11 ENCOUNTER — Telehealth: Admit: 2020-12-11 | Discharge: 2020-12-12 | Payer: PRIVATE HEALTH INSURANCE | Attending: Clinical | Primary: Clinical

## 2020-12-11 DIAGNOSIS — F332 Major depressive disorder, recurrent severe without psychotic features: Principal | ICD-10-CM

## 2020-12-11 DIAGNOSIS — F419 Anxiety disorder, unspecified: Principal | ICD-10-CM

## 2020-12-13 DIAGNOSIS — F419 Anxiety disorder, unspecified: Principal | ICD-10-CM

## 2020-12-13 DIAGNOSIS — F332 Major depressive disorder, recurrent severe without psychotic features: Principal | ICD-10-CM

## 2020-12-14 MED ORDER — FLUOXETINE 40 MG CAPSULE
ORAL_CAPSULE | Freq: Every day | ORAL | 0 refills | 90.00000 days | Status: CP
Start: 2020-12-14 — End: 2021-03-14

## 2020-12-18 ENCOUNTER — Telehealth: Admit: 2020-12-18 | Discharge: 2020-12-19 | Payer: PRIVATE HEALTH INSURANCE | Attending: Clinical | Primary: Clinical

## 2020-12-18 DIAGNOSIS — F419 Anxiety disorder, unspecified: Principal | ICD-10-CM

## 2020-12-18 DIAGNOSIS — F332 Major depressive disorder, recurrent severe without psychotic features: Principal | ICD-10-CM

## 2020-12-25 DIAGNOSIS — G4700 Insomnia, unspecified: Principal | ICD-10-CM

## 2020-12-25 DIAGNOSIS — F419 Anxiety disorder, unspecified: Principal | ICD-10-CM

## 2020-12-28 ENCOUNTER — Ambulatory Visit: Admit: 2020-12-28 | Discharge: 2020-12-29 | Payer: PRIVATE HEALTH INSURANCE

## 2020-12-28 DIAGNOSIS — B372 Candidiasis of skin and nail: Principal | ICD-10-CM

## 2020-12-28 DIAGNOSIS — L739 Follicular disorder, unspecified: Principal | ICD-10-CM

## 2020-12-28 MED ORDER — CLINDAMYCIN HCL 300 MG CAPSULE
ORAL_CAPSULE | Freq: Three times a day (TID) | ORAL | 0 refills | 10.00000 days | Status: CP
Start: 2020-12-28 — End: 2021-01-07

## 2020-12-28 MED ORDER — NYSTATIN 100,000 UNIT/GRAM TOPICAL POWDER
TOPICAL | 0 refills | 0.00000 days | Status: CP
Start: 2020-12-28 — End: 2021-12-28

## 2021-01-01 ENCOUNTER — Telehealth: Admit: 2021-01-01 | Discharge: 2021-01-02 | Payer: PRIVATE HEALTH INSURANCE | Attending: Clinical | Primary: Clinical

## 2021-01-01 DIAGNOSIS — F419 Anxiety disorder, unspecified: Principal | ICD-10-CM

## 2021-01-01 DIAGNOSIS — F332 Major depressive disorder, recurrent severe without psychotic features: Principal | ICD-10-CM

## 2021-01-04 ENCOUNTER — Ambulatory Visit: Admit: 2021-01-04 | Discharge: 2021-01-04 | Payer: PRIVATE HEALTH INSURANCE

## 2021-01-04 DIAGNOSIS — K7460 Unspecified cirrhosis of liver: Principal | ICD-10-CM

## 2021-01-09 ENCOUNTER — Telehealth
Admit: 2021-01-09 | Discharge: 2021-01-10 | Payer: PRIVATE HEALTH INSURANCE | Attending: Student in an Organized Health Care Education/Training Program | Primary: Student in an Organized Health Care Education/Training Program

## 2021-01-09 DIAGNOSIS — F332 Major depressive disorder, recurrent severe without psychotic features: Principal | ICD-10-CM

## 2021-01-09 DIAGNOSIS — F419 Anxiety disorder, unspecified: Principal | ICD-10-CM

## 2021-01-09 DIAGNOSIS — G4700 Insomnia, unspecified: Principal | ICD-10-CM

## 2021-01-09 MED ORDER — TRAZODONE 50 MG TABLET
ORAL_TABLET | 0 refills | 0 days | Status: CP
Start: 2021-01-09 — End: 2021-04-19

## 2021-01-09 MED ORDER — FLUOXETINE 20 MG CAPSULE
ORAL_CAPSULE | Freq: Every day | ORAL | 3 refills | 30 days | Status: CP
Start: 2021-01-09 — End: 2021-05-09

## 2021-01-10 ENCOUNTER — Telehealth: Admit: 2021-01-10 | Discharge: 2021-01-11 | Payer: PRIVATE HEALTH INSURANCE | Attending: Clinical | Primary: Clinical

## 2021-01-10 DIAGNOSIS — F419 Anxiety disorder, unspecified: Principal | ICD-10-CM

## 2021-01-10 DIAGNOSIS — F332 Major depressive disorder, recurrent severe without psychotic features: Principal | ICD-10-CM

## 2021-01-15 MED ORDER — FLUOXETINE 20 MG CAPSULE
ORAL_CAPSULE | Freq: Every day | ORAL | 3 refills | 30 days | Status: CP
Start: 2021-01-15 — End: 2021-05-15

## 2021-01-15 MED ORDER — TRAZODONE 50 MG TABLET
ORAL_TABLET | 0 refills | 0 days | Status: CP
Start: 2021-01-15 — End: 2021-04-25

## 2021-01-17 ENCOUNTER — Telehealth: Admit: 2021-01-17 | Discharge: 2021-01-18 | Payer: PRIVATE HEALTH INSURANCE | Attending: Clinical | Primary: Clinical

## 2021-01-17 DIAGNOSIS — F419 Anxiety disorder, unspecified: Principal | ICD-10-CM

## 2021-01-17 DIAGNOSIS — F332 Major depressive disorder, recurrent severe without psychotic features: Principal | ICD-10-CM

## 2021-01-30 ENCOUNTER — Telehealth: Admit: 2021-01-30 | Discharge: 2021-01-31 | Payer: PRIVATE HEALTH INSURANCE | Attending: Clinical | Primary: Clinical

## 2021-01-30 DIAGNOSIS — F332 Major depressive disorder, recurrent severe without psychotic features: Principal | ICD-10-CM

## 2021-01-30 DIAGNOSIS — F419 Anxiety disorder, unspecified: Principal | ICD-10-CM

## 2021-01-31 ENCOUNTER — Ambulatory Visit
Admit: 2021-01-31 | Discharge: 2021-02-01 | Payer: PRIVATE HEALTH INSURANCE | Attending: Student in an Organized Health Care Education/Training Program | Primary: Student in an Organized Health Care Education/Training Program

## 2021-01-31 DIAGNOSIS — L308 Other specified dermatitis: Principal | ICD-10-CM

## 2021-01-31 DIAGNOSIS — L739 Follicular disorder, unspecified: Principal | ICD-10-CM

## 2021-01-31 DIAGNOSIS — L720 Epidermal cyst: Principal | ICD-10-CM

## 2021-01-31 DIAGNOSIS — L853 Xerosis cutis: Principal | ICD-10-CM

## 2021-01-31 MED ORDER — TRIAMCINOLONE ACETONIDE 0.1 % TOPICAL OINTMENT
INTRAMUSCULAR | 1 refills | 0.00000 days | Status: CP
Start: 2021-01-31 — End: ?

## 2021-02-06 ENCOUNTER — Other Ambulatory Visit: Payer: Self-pay | Admitting: Nurse Practitioner

## 2021-02-06 DIAGNOSIS — K7460 Unspecified cirrhosis of liver: Principal | ICD-10-CM

## 2021-02-07 ENCOUNTER — Telehealth: Admit: 2021-02-07 | Discharge: 2021-02-08 | Payer: PRIVATE HEALTH INSURANCE | Attending: Clinical | Primary: Clinical

## 2021-02-16 ENCOUNTER — Other Ambulatory Visit: Payer: Self-pay

## 2021-02-16 ENCOUNTER — Ambulatory Visit
Admission: RE | Admit: 2021-02-16 | Discharge: 2021-02-16 | Disposition: A | Discharge status: Home / Self Care | Payer: BC Managed Care – PPO | Source: Ambulatory Visit | Attending: Nurse Practitioner | Admitting: Nurse Practitioner

## 2021-02-16 DIAGNOSIS — K7460 Unspecified cirrhosis of liver: Secondary | ICD-10-CM | POA: Insufficient documentation

## 2021-03-06 ENCOUNTER — Telehealth
Admit: 2021-03-06 | Discharge: 2021-03-07 | Payer: PRIVATE HEALTH INSURANCE | Attending: Student in an Organized Health Care Education/Training Program | Primary: Student in an Organized Health Care Education/Training Program

## 2021-03-06 DIAGNOSIS — F332 Major depressive disorder, recurrent severe without psychotic features: Principal | ICD-10-CM

## 2021-03-06 DIAGNOSIS — F419 Anxiety disorder, unspecified: Principal | ICD-10-CM

## 2021-03-06 MED ORDER — FLUOXETINE 20 MG CAPSULE
ORAL_CAPSULE | Freq: Every day | ORAL | 3 refills | 30.00000 days | Status: CP
Start: 2021-03-06 — End: 2021-07-04

## 2021-03-07 ENCOUNTER — Ambulatory Visit
Admit: 2021-03-07 | Discharge: 2021-03-08 | Payer: PRIVATE HEALTH INSURANCE | Attending: Student in an Organized Health Care Education/Training Program | Primary: Student in an Organized Health Care Education/Training Program

## 2021-03-07 DIAGNOSIS — L720 Epidermal cyst: Principal | ICD-10-CM

## 2021-03-08 ENCOUNTER — Ambulatory Visit: Admit: 2021-03-08 | Discharge: 2021-03-09 | Payer: PRIVATE HEALTH INSURANCE

## 2021-03-12 MED ORDER — CLONAZEPAM 0.5 MG TABLET
ORAL_TABLET | Freq: Two times a day (BID) | ORAL | 0 refills | 30 days | Status: CP | PRN
Start: 2021-03-12 — End: 2021-04-11

## 2021-03-13 ENCOUNTER — Telehealth: Admit: 2021-03-13 | Discharge: 2021-03-14 | Payer: PRIVATE HEALTH INSURANCE | Attending: Clinical | Primary: Clinical

## 2021-03-13 DIAGNOSIS — F419 Anxiety disorder, unspecified: Principal | ICD-10-CM

## 2021-03-13 DIAGNOSIS — F332 Major depressive disorder, recurrent severe without psychotic features: Principal | ICD-10-CM

## 2021-04-04 DIAGNOSIS — F419 Anxiety disorder, unspecified: Principal | ICD-10-CM

## 2021-04-04 DIAGNOSIS — G4700 Insomnia, unspecified: Principal | ICD-10-CM

## 2021-04-05 ENCOUNTER — Ambulatory Visit
Admit: 2021-04-05 | Discharge: 2021-04-05 | Payer: PRIVATE HEALTH INSURANCE | Attending: Family Medicine | Primary: Family Medicine

## 2021-04-05 DIAGNOSIS — Z713 Dietary counseling and surveillance: Principal | ICD-10-CM

## 2021-04-05 DIAGNOSIS — R29818 Other symptoms and signs involving the nervous system: Principal | ICD-10-CM

## 2021-04-05 DIAGNOSIS — R7303 Prediabetes: Principal | ICD-10-CM

## 2021-04-05 MED ORDER — SEMAGLUTIDE 1 MG/DOSE (4 MG/3 ML) SUBCUTANEOUS PEN INJECTOR
SUBCUTANEOUS | 2 refills | 84.00000 days | Status: CP
Start: 2021-04-05 — End: 2021-07-04

## 2021-04-05 MED ORDER — TRAZODONE 50 MG TABLET
ORAL_TABLET | 3 refills | 0 days | Status: CP
Start: 2021-04-05 — End: ?

## 2021-04-09 ENCOUNTER — Ambulatory Visit: Admit: 2021-04-09 | Discharge: 2021-04-09 | Payer: PRIVATE HEALTH INSURANCE

## 2021-04-09 DIAGNOSIS — G3184 Mild cognitive impairment, so stated: Principal | ICD-10-CM

## 2021-04-09 DIAGNOSIS — R29818 Other symptoms and signs involving the nervous system: Principal | ICD-10-CM

## 2021-04-17 ENCOUNTER — Telehealth: Admit: 2021-04-17 | Discharge: 2021-04-17 | Payer: PRIVATE HEALTH INSURANCE | Attending: Clinical | Primary: Clinical

## 2021-04-17 DIAGNOSIS — F332 Major depressive disorder, recurrent severe without psychotic features: Principal | ICD-10-CM

## 2021-04-17 DIAGNOSIS — F419 Anxiety disorder, unspecified: Principal | ICD-10-CM

## 2021-04-24 ENCOUNTER — Ambulatory Visit: Admit: 2021-04-24 | Discharge: 2021-04-25 | Payer: PRIVATE HEALTH INSURANCE

## 2021-04-24 ENCOUNTER — Telehealth
Admit: 2021-04-24 | Discharge: 2021-04-25 | Payer: PRIVATE HEALTH INSURANCE | Attending: Student in an Organized Health Care Education/Training Program | Primary: Student in an Organized Health Care Education/Training Program

## 2021-04-24 DIAGNOSIS — F419 Anxiety disorder, unspecified: Principal | ICD-10-CM

## 2021-04-24 DIAGNOSIS — F332 Major depressive disorder, recurrent severe without psychotic features: Principal | ICD-10-CM

## 2021-04-24 MED ORDER — DIAZEPAM 5 MG TABLET
ORAL_TABLET | Freq: Every day | ORAL | 1 refills | 30 days | Status: CP | PRN
Start: 2021-04-24 — End: 2021-06-23

## 2021-04-24 MED ORDER — FLUOXETINE 20 MG CAPSULE
ORAL_CAPSULE | Freq: Every day | ORAL | 3 refills | 45 days | Status: CP
Start: 2021-04-24 — End: 2021-10-21

## 2021-05-05 ENCOUNTER — Ambulatory Visit: Admit: 2021-05-05 | Discharge: 2021-05-07 | Payer: PRIVATE HEALTH INSURANCE

## 2021-05-07 MED ORDER — CLONAZEPAM 0.5 MG TABLET
ORAL_TABLET | Freq: Four times a day (QID) | ORAL | 1 refills | 15 days | Status: CP | PRN
Start: 2021-05-07 — End: 2021-06-06

## 2021-05-15 ENCOUNTER — Telehealth: Admit: 2021-05-15 | Discharge: 2021-05-16 | Payer: PRIVATE HEALTH INSURANCE | Attending: Clinical | Primary: Clinical

## 2021-05-15 DIAGNOSIS — F419 Anxiety disorder, unspecified: Principal | ICD-10-CM

## 2021-05-15 DIAGNOSIS — F332 Major depressive disorder, recurrent severe without psychotic features: Principal | ICD-10-CM

## 2021-05-31 ENCOUNTER — Ambulatory Visit: Admit: 2021-05-31 | Discharge: 2021-06-01 | Payer: PRIVATE HEALTH INSURANCE

## 2021-05-31 DIAGNOSIS — F5104 Psychophysiologic insomnia: Principal | ICD-10-CM

## 2021-05-31 DIAGNOSIS — G3184 Mild cognitive impairment, so stated: Principal | ICD-10-CM

## 2021-05-31 DIAGNOSIS — F332 Major depressive disorder, recurrent severe without psychotic features: Principal | ICD-10-CM

## 2021-06-04 ENCOUNTER — Telehealth: Admit: 2021-06-04 | Discharge: 2021-06-05 | Payer: PRIVATE HEALTH INSURANCE | Attending: Clinical | Primary: Clinical

## 2021-06-04 DIAGNOSIS — F419 Anxiety disorder, unspecified: Principal | ICD-10-CM

## 2021-06-04 DIAGNOSIS — F332 Major depressive disorder, recurrent severe without psychotic features: Principal | ICD-10-CM

## 2021-06-05 ENCOUNTER — Ambulatory Visit: Admit: 2021-06-05 | Discharge: 2021-06-06 | Payer: PRIVATE HEALTH INSURANCE

## 2021-06-05 DIAGNOSIS — F332 Major depressive disorder, recurrent severe without psychotic features: Principal | ICD-10-CM

## 2021-06-05 DIAGNOSIS — R7303 Prediabetes: Principal | ICD-10-CM

## 2021-06-05 MED ORDER — DESVENLAFAXINE SUCCINATE ER 25 MG TABLET,EXTENDED RELEASE 24 HR
ORAL_TABLET | Freq: Every day | ORAL | 0 refills | 30 days | Status: CP
Start: 2021-06-05 — End: 2021-07-05

## 2021-06-06 ENCOUNTER — Ambulatory Visit
Admit: 2021-06-06 | Discharge: 2021-06-07 | Payer: PRIVATE HEALTH INSURANCE | Attending: Family Medicine | Primary: Family Medicine

## 2021-06-06 DIAGNOSIS — F332 Major depressive disorder, recurrent severe without psychotic features: Principal | ICD-10-CM

## 2021-06-06 DIAGNOSIS — R7303 Prediabetes: Principal | ICD-10-CM

## 2021-06-08 ENCOUNTER — Telehealth: Admit: 2021-06-08 | Discharge: 2021-06-09 | Payer: PRIVATE HEALTH INSURANCE | Attending: Clinical | Primary: Clinical

## 2021-06-08 DIAGNOSIS — F419 Anxiety disorder, unspecified: Principal | ICD-10-CM

## 2021-06-08 DIAGNOSIS — F332 Major depressive disorder, recurrent severe without psychotic features: Principal | ICD-10-CM

## 2021-06-13 MED ORDER — CLONAZEPAM 0.5 MG TABLET
ORAL_TABLET | Freq: Four times a day (QID) | ORAL | 0 refills | 15 days | Status: CP | PRN
Start: 2021-06-13 — End: 2021-07-13

## 2021-06-18 ENCOUNTER — Telehealth: Admit: 2021-06-18 | Discharge: 2021-06-19 | Payer: PRIVATE HEALTH INSURANCE | Attending: Clinical | Primary: Clinical

## 2021-06-18 DIAGNOSIS — F332 Major depressive disorder, recurrent severe without psychotic features: Principal | ICD-10-CM

## 2021-06-18 DIAGNOSIS — F419 Anxiety disorder, unspecified: Principal | ICD-10-CM

## 2021-06-19 ENCOUNTER — Telehealth: Admit: 2021-06-19 | Discharge: 2021-06-20 | Payer: PRIVATE HEALTH INSURANCE

## 2021-06-19 ENCOUNTER — Telehealth
Admit: 2021-06-19 | Discharge: 2021-06-20 | Payer: PRIVATE HEALTH INSURANCE | Attending: Student in an Organized Health Care Education/Training Program | Primary: Student in an Organized Health Care Education/Training Program

## 2021-06-19 DIAGNOSIS — F332 Major depressive disorder, recurrent severe without psychotic features: Principal | ICD-10-CM

## 2021-06-19 DIAGNOSIS — G4700 Insomnia, unspecified: Principal | ICD-10-CM

## 2021-06-19 DIAGNOSIS — F419 Anxiety disorder, unspecified: Principal | ICD-10-CM

## 2021-06-19 MED ORDER — DESVENLAFAXINE ER 50 MG TABLET,EXTENDED RELEASE 24 HR
ORAL_TABLET | Freq: Every day | ORAL | 0 refills | 90 days | Status: CP
Start: 2021-06-19 — End: 2021-09-17

## 2021-06-26 ENCOUNTER — Telehealth
Admit: 2021-06-26 | Discharge: 2021-06-27 | Payer: PRIVATE HEALTH INSURANCE | Attending: Student in an Organized Health Care Education/Training Program | Primary: Student in an Organized Health Care Education/Training Program

## 2021-06-26 DIAGNOSIS — F332 Major depressive disorder, recurrent severe without psychotic features: Principal | ICD-10-CM

## 2021-06-26 MED ORDER — CLONAZEPAM 0.5 MG TABLET
ORAL_TABLET | Freq: Three times a day (TID) | ORAL | 1 refills | 30 days | Status: CP | PRN
Start: 2021-06-26 — End: 2021-08-25

## 2021-06-27 DIAGNOSIS — F411 Generalized anxiety disorder: Principal | ICD-10-CM

## 2021-06-27 DIAGNOSIS — F332 Major depressive disorder, recurrent severe without psychotic features: Principal | ICD-10-CM

## 2021-06-27 DIAGNOSIS — K7460 Unspecified cirrhosis of liver: Principal | ICD-10-CM

## 2021-06-29 MED ORDER — DESVENLAFAXINE SUCCINATE ER 100 MG TABLET,EXTENDED RELEASE 24 HR
ORAL_TABLET | Freq: Every day | ORAL | 2 refills | 30 days | Status: CP
Start: 2021-06-29 — End: 2021-09-27

## 2021-07-02 ENCOUNTER — Telehealth: Admit: 2021-07-02 | Discharge: 2021-07-03 | Payer: PRIVATE HEALTH INSURANCE | Attending: Clinical | Primary: Clinical

## 2021-07-02 DIAGNOSIS — F419 Anxiety disorder, unspecified: Principal | ICD-10-CM

## 2021-07-02 DIAGNOSIS — F332 Major depressive disorder, recurrent severe without psychotic features: Principal | ICD-10-CM

## 2021-07-05 ENCOUNTER — Ambulatory Visit: Admit: 2021-07-05 | Discharge: 2021-07-06 | Payer: PRIVATE HEALTH INSURANCE

## 2021-07-05 DIAGNOSIS — K7460 Unspecified cirrhosis of liver: Principal | ICD-10-CM

## 2021-07-05 DIAGNOSIS — K7682 Hepatic encephalopathy (CMS-HCC): Principal | ICD-10-CM

## 2021-07-05 MED ORDER — LACTULOSE 10 GRAM/15 ML ORAL SOLUTION
Freq: Three times a day (TID) | ORAL | 11 refills | 30.00000 days | Status: CP
Start: 2021-07-05 — End: ?

## 2021-07-05 MED ORDER — RIFAXIMIN 550 MG TABLET
ORAL_TABLET | Freq: Two times a day (BID) | ORAL | 4 refills | 90 days | Status: CP
Start: 2021-07-05 — End: ?
  Filled 2021-07-10: qty 60, 30d supply, fill #0

## 2021-07-06 ENCOUNTER — Telehealth: Admit: 2021-07-06 | Discharge: 2021-07-07 | Payer: PRIVATE HEALTH INSURANCE | Attending: Clinical | Primary: Clinical

## 2021-07-06 DIAGNOSIS — F419 Anxiety disorder, unspecified: Principal | ICD-10-CM

## 2021-07-06 DIAGNOSIS — F332 Major depressive disorder, recurrent severe without psychotic features: Principal | ICD-10-CM
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
## 2021-07-06 NOTE — Unmapped (Signed)
[facility] Specialty Medication Onboarding    Specialty Medication: XIFAXAN 550 mg Tab (rifAXIMin)  Prior Authorization: Not Required   Financial Assistance: No - copay  <$25  Final Copay/Day Supply: $0 / 30    Insurance Restrictions: Yes - max 1 month supply     Notes to Pharmacist:     The triage team has completed the benefits investigation and has determined that the patient is able to fill this medication at [facility]. Please contact the patient to complete the onboarding or follow up with the prescribing physician as needed.

## 2021-07-10 NOTE — Unmapped (Signed)
[facility]   Patient Onboarding/Medication Counseling    Alexis Burns is a 56 y.o. female with Hepatic Encephalopathy who I am counseling today on initiation of therapy.  I am speaking to the patient.    Was a Nurse, learning disability used for this call? No    Verified patient's date of birth / HIPAA.    Specialty medication(s) to be sent: Infectious Disease: Xifaxan      Non-specialty medications/supplies to be sent: n/a      Medications not needed at this time: n/a         Xifaxan (rifaximin) 550mg  tablets    Medication & Administration     Dosage: Hepatic Encephalopathy Prevention: Take one 550mg  tablet by mouth twice daily.    Administration: Take with or without food.    Adherence/Missed dose instructions:   Take missed dose as soon as you remember. If it is close to the time of your next dose, skip the missed dose and resume with your next scheduled dose.  Do not take extra doses or 2 doses at the same time.    Goals of Therapy     Hepatic Encephalopathy: The goal is to reduce risk of overt hepatic encephalopathy recurrence.    Side Effects & Monitoring Parameters     Common Side Effects:   Peripheral edema  Nausea  Dizziness  Fatigue  Ascites    The following side effects should be reported to the provider:  Signs of an allergic reaction, such as rash; hives; itching; red, swollen, blistered, or peeling skin with or without fever. If you have wheezing; tightness in the chest or throat; trouble breathing, swallowing, or talking; unusual hoarseness; or swelling of the mouth, face, lips, tongue, or throat, call 911 or go to the closest emergency department (ED).   Swelling in the arms, legs or stomach.   Feeling very tired or weak.  Low mood (depression).   Fever.   Diarrhea is common with antibiotics. Rarely, a severe form called C diff-associated diarrhea (CDAD) may happen. Sometimes this has led to a deadly bowel problem (colitis). CDAD may happen during or a few months after taking antibiotics. Call your doctor right away if you have stomach pain, cramps, or very loose, watery, or bloody stools. Check with your doctor before treating diarrhea.    Monitoring Parameters:   For the prevention of hepatic encephalopathy:  Patient should monitor for changes in mental status.         Contraindications, Warnings, & Precautions     Superinfection: Prolonged use may result in fungal or bacterial superinfection, including Clostridioides (formerly Clostridium) difficile-associated diarrhea (CDAD) and pseudomembranous colitis; CDAD has been observed >2 months post-antibiotic treatment.  Severe (Child Pugh Class C) Hepatic Impairment: increased systemic exposure with severe hepatic impairment.  Concomitant use with P-glycoprotein (P-gp) inhibitors: P-gp inhibitors may increase systemic exposure of rifaximin.    Drug/Food Interactions     Medication list reviewed in Epic. The patient was instructed to inform the care team before taking any new medications or supplements. No drug interactions identified.   Warfarin: monitor INR and prothrombin time; Dose adjustment of warfarin may be needed to maintain target INR range.    Storage, Handling Precautions, & Disposal     Store this medication at room temperature.  Store in a dry place. Do not store in a bathroom.   Keep all drugs out of the reach of children and pets.  Throw away unused or expired drugs. Do not flush down a  toilet or pour down a drain unless you are told to do so. Check with your pharmacist if you have questions about the best way to throw out drugs. There may be drug take-back programs in your area.      Current Medications (including OTC/herbals), Comorbidities and Allergies     Current Outpatient Medications   Medication Sig Dispense Refill    acetaminophen (TYLENOL) 500 MG tablet Take by mouth.      albuterol HFA 90 mcg/actuation inhaler Inhale 2 puffs every six (6) hours as needed for wheezing. 8 g 0    amLODIPine (NORVASC) 10 MG tablet Take 1 tablet (10 mg total) by mouth daily. For high blood pressure 90 tablet 3    clonazePAM (KLONOPIN) 0.5 MG tablet Take 1 tablet (0.5 mg total) by mouth Three (3) times a day as needed for anxiety (panic). 90 tablet 1    desvenlafaxine succinate (PRISTIQ) 100 MG 24 hr tablet Take 1 tablet (100 mg total) by mouth daily. 30 tablet 2    ibuprofen (ADVIL,MOTRIN) 200 MG tablet Take 1 tablet (200 mg total) by mouth every six (6) hours as needed for pain. Taken 4 po PRN (Patient not taking: Reported on 07/10/2021)      lactulose 10 gram/15 mL solution Take 30 mL (20 g total) by mouth Three (3) times a day. 300 mL 0    lactulose 10 gram/15 mL solution Take 30 mL (20 g total) by mouth Three (3) times a day. 2700 mL 11    MAGNESIUM ORAL Take 30 mg by mouth Two (2) times a day. (Patient not taking: Reported on 07/10/2021)      nystatin (MYCOSTATIN) 100,000 unit/gram powder Apply to affected area 3 times daily (Patient not taking: Reported on 07/10/2021) 15 g 0    potassium chloride 20 MEQ CR tablet Take 1 tablet (20 mEq total) by mouth Two (2) times a day. (Patient not taking: Reported on 07/10/2021)      XIFAXAN 550 mg Tab Take 1 tablet (550 mg total) by mouth Two (2) times a day. 180 tablet 4    SUMAtriptan (IMITREX) 50 MG tablet Take 1 tablet (50 mg total) by mouth every two (2) hours as needed for migraine. 10 tablet 11    traZODone (DESYREL) 50 MG tablet TAKE 1 TABLET NIGHTLY AS NEEDED FOR SLEEP, AND TAKE ONE-HALF (1/2) TABLET UP TO TWICE A DAY AS NEEDED FOR ANXIETY (Patient taking differently: 0.5 tablets (25 mg total) nightly. Taking 1/2 tab at night) 180 tablet 3     No current facility-administered medications for this visit.       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    Appropriateness of Therapy     Acute infections noted within Epic:  No active infections  Patient reported infection: None    Is medication and dose appropriate based on diagnosis and infection status? Yes    Prescription has been clinically reviewed: Yes      Baseline Quality of Life Assessment      How many days over the past month did your Hepatic Encephalopathy  keep you from your normal activities? For example, brushing your teeth or getting up in the morning. 0    Financial Information     Medication Assistance provided: None Required    Anticipated copay of $0.00 for a maximum of 30 days per fill was  reviewed with patient. Verified delivery address.    Delivery Information     Scheduled delivery date: 07/10/21    Expected start date: 07/10/21    Medication will be delivered via Same Day Courier to the prescription address in [facility].  This shipment will not require a signature.      Explained the services we provide at [facility] and that each month we would call to set up refills.  Stressed importance of returning phone calls so that we could ensure they receive their medications in time each month.  Informed patient that we should be setting up refills 7-10 days prior to when they will run out of medication.  A pharmacist will reach out to perform a clinical assessment periodically.  Informed patient that a welcome packet, containing information about our pharmacy and other support services, a Notice of Privacy Practices, and a drug information handout will be sent.      The patient or caregiver noted above participated in the development of this care plan and knows that they can request review of or adjustments to the care plan at any time.      Patient or caregiver verbalized understanding of the above information as well as how to contact the pharmacy at [phone] option 4 with any questions/concerns.  The pharmacy is open Monday through Friday 8:30am-4:30pm.  A pharmacist is available 24/7 via pager to answer any clinical questions they may have.    Patient Specific Needs     Does the patient have any physical, cognitive, or cultural barriers? No    Does the patient have adequate living arrangements? (i.e. the ability to store and take their medication appropriately) Yes    Did you identify any home environmental safety or security hazards? No    Patient prefers to have medications discussed with  Patient     Is the patient or caregiver able to read and understand education materials at a high school level or above? Yes    Patient's primary language is  English     Is the patient high risk? No      Roderic Palau  [facility] Specialty Pharmacist

## 2021-07-11 NOTE — Unmapped (Signed)
Refill request for Lactulose from [facility] in [place]    Patient will be getting refills from [facility]--> separate prescription entered by the provider for [facility]    I contacted [facility] in [place] and spoke with Ephriam Knuckles (pharmacist)    I made him aware that the patient will be receiving Lactulose from [facility] moving forward; therefore, no refills will be authorized at [facility]    He was appreciative for the call    Lanora Manis, RN

## 2021-07-11 NOTE — Unmapped (Signed)
Phone call with patient Crista Nuon [phone])  Phone call with patient after she described some worsening of symptoms recently. She notes that she has been having some dizziness- notes that she had a fall 3 times in one day earlier this week. Patient has had dizziness and lightheadedness since starting lactulose however she has been drinking more water and that has been helpful. Discussed how starting lactulose may have caused some hypotension. Lisaann notes that she has had some second thoughts about her interventional psychiatry- strongly advised that she should keep that appointment. Advised that I would reach out to the interventional team prior to that appointment to give them background. Matti notes that her mood has been 50/50 and she continues to have strong emotional triggers. Last night was particularly triggering as a pharmacist let her know that she may have to be on Rifaximin for the rest of her life.     Discussed possibility of increasing Pristiq and advised that there is no data to support efficacy on higher doses than she is currently on and I would want to mitigate any risk of side effect as she is adjusting to the current dose. Advised that she start the Rifaxamin as planned and continue to monitor how this may improve symptomology. Advised follow up with interventional psychiatry as planned on 5/16. Patient verbalized reassurance and understanding of plan.

## 2021-07-11 NOTE — Unmapped (Signed)
Left Alexis Burns a voicemail to call back Dr. Valarie Cones at my office number at [phone] after she called the front desk yesterday reporting worsening of symptoms. Additionally, advised that she can respond to my MyChart message if needed.

## 2021-07-12 DIAGNOSIS — I10 Essential (primary) hypertension: Principal | ICD-10-CM

## 2021-07-12 MED ORDER — AMLODIPINE 10 MG TABLET
ORAL_TABLET | Freq: Every day | ORAL | 3 refills | 90 days
Start: 2021-07-12 — End: 2022-07-12

## 2021-07-12 MED ORDER — CLONAZEPAM 0.5 MG TABLET
ORAL_TABLET | Freq: Three times a day (TID) | ORAL | 1 refills | 30 days | PRN
Start: 2021-07-12 — End: 2021-09-10

## 2021-07-13 MED ORDER — AMLODIPINE 10 MG TABLET
ORAL_TABLET | Freq: Every day | ORAL | 3 refills | 90 days
Start: 2021-07-13 — End: 2022-07-13

## 2021-07-13 MED ORDER — CLONAZEPAM 0.5 MG TABLET
ORAL_TABLET | Freq: Three times a day (TID) | ORAL | 1 refills | 30 days | Status: CP | PRN
Start: 2021-07-13 — End: 2021-09-11

## 2021-07-13 NOTE — Unmapped (Signed)
Patient needs an appointment. 30 day refill cannot be given.

## 2021-07-13 NOTE — Unmapped (Signed)
Phone call with Wilburta after she requested a refill of her klonopin.     Advised that there is a refill available, however she notes that she did not see one available at the pharmacy. She reports that she is currently taking 1 tablet of Klonopin 0.5 mg in the morning and two tablets of Klonopin 0.5 mg in the afternoons. She currently has #8 tablets available. Advised that I would refill her prescription and make sure that there were refills available. PDMP reviewed.     Additionally, Aayra notes that she does not believe that the Pristiq has been helpful for her. Advised that I would not recommend making medication changes prior to her appointment with Dr. Fredrich Birks on Tuesday and she verbalizes understanding and is agreeable with plan.

## 2021-07-16 NOTE — Unmapped (Signed)
Left message    This is [facility] Outpatient ECT service calling as a reminder that you have  Virtual consult appt.for tomorrow  May 16h, at 8:30a with Dr. Rondall Allegra.  Please do log into your My Chart at least 20 mins. before your appointment starts to update any information. If you encounter any technical problems please call the office before your appointment to resolve [phone].           Thank you  Onnie Graham Department of Psychiatry  Out Patient ECT

## 2021-07-17 ENCOUNTER — Telehealth: Admit: 2021-07-17 | Discharge: 2021-07-18 | Payer: PRIVATE HEALTH INSURANCE

## 2021-07-17 DIAGNOSIS — F411 Generalized anxiety disorder: Principal | ICD-10-CM

## 2021-07-17 DIAGNOSIS — F332 Major depressive disorder, recurrent severe without psychotic features: Principal | ICD-10-CM

## 2021-07-17 NOTE — Unmapped (Addendum)
Start ECT as an outpatient  - get spinal xray --- call [facility] at [phone]  - get EKG --- call  [phone] to schedule  We will add you to the list to be directly admitted to the hospital for inpatient ECT

## 2021-07-17 NOTE — Unmapped (Signed)
[facility]  Psychiatry   Interventional Psychiatry Consultation        Encounter Description: This encounter was conducted via live, face-to-face video conference in the setting of State of Emergency due to COVID-19 Pandemic. Total time: 90 minutes on the real-time audio and video with the patient. An additional 20 minutes were spent on pre- and post-visit activities.     The patient was physically located in [place] or a state in which I am permitted to provide care. The patient and/or parent/guardian understood that s/he may incur co-pays and cost sharing, and agreed to the telemedicine visit. The visit was reasonable and appropriate under the circumstances given the patient's presentation at the time.    The patient and/or parent/guardian has been advised of the potential risks and limitations of this mode of treatment (including, but not limited to, the absence of in-person examination) and has agreed to be treated using telemedicine. The patient's/patient's family's questions regarding telemedicine have been answered.     If the visit was completed in an ambulatory setting, the patient and/or parent/guardian has also been advised to contact their provider???s office for worsening conditions, and seek emergency medical treatment and/or call 911 if the patient deems either necessary.      Service Type: Outpatient and Virtual  Requesting Physician: Katherine Mantle MD  Consulting Service: Psychiatry  Service Date: Jul 17, 2021      Assessment:     Alexis Burns is a 56 y.o., White race, Not Hispanic, Latino/a, or Spanish origin ethnicity, ENGLISH speaking female with a history of depression, anxiety that has been referred for an interventional psychiatry service. This patient is voluntarily seeking this consultation as a potential therapeutic intervention for their symptoms. Patient with a longstanding history of depression, worse in the past six months.  Has benefited previously from medications but says they have been ineffective in the past several months.  Has daily SI with no intent.  Patient's medical history significant for fatty liver disease and brain fog which she associates with shoulder surgery a year ago.  Patient is an appropriate candidate for ECT.  Given her severe depression, she would like to be admitted voluntarily for safety and stabilization.  Will proceed with direct admission process.    Patient Active Problem List   Diagnosis   ??? Mild intermittent asthma without complication   ??? Attention deficit disorder   ??? Binge eating disorder   ??? Class 3 obesity (CMS-HCC)   ??? Hyperlipidemia, unspecified   ??? Essential hypertension   ??? 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 Abdominal hernia   ??? Chronic migraine   ??? Ventral hernia without obstruction or gangrene   ??? Hypokalemia   ??? Anxiety   ??? Cirrhosis (CMS-HCC)   ??? Folliculitis of right axilla   ??? Skin yeast infection   ??? Prediabetes   ??? Obstructive sleep apnea syndrome, mild   ??? Mild cognitive impairment   ??? Psychophysiological insomnia       Diagnosis for which an interventional service is an indication: depression, anxiety  Presenting symptoms as targets for treatment: severe depression  Previous trials which have been ineffective: Antidepressants - SSRIs, Antidepressants - SNRIs, Antidepressants - Atypicals and Therapy    Psychometric scales:   PHQ-9 PHQ-9 TOTAL SCORE   06/18/2021   5:00 PM 24   06/05/2021   9:00 AM 26   11/11/2019   8:00 PM 13   11/05/2019   6:00 AM 21  09/28/2019   9:00 AM 13         Below is a summary of the interventional services patient is felt to be appropriate for at this time:  ECT: Pt is a very good candidate. This is based on review of the diagnosis, failure or intolerance to past treatments, psychiatric or medical acuity, medical issues which may increase risk of ECT and the capacity for informed consent. Note that the prescence of comorbidities reduce the likelihood of complete remission following ECT treatment.                      Workup:     ?? Inpatient ECT: Please utilize the ECT orderset to complete the following orders:  ?? Electroconvulsive Therapy  ?? Notify ECT Coordinator Raina Mina [phone]  ?? Provide educational material: ECT pamphlet  ?? Prep patient for ECT: every M-W-F per protocol  ?? Pt needs to be NPO at  midnight before each procedure  ?? Do not give anticonvulsants, benzodiazepines, ambien, or other medications that can interfere with seizures after 6p on the night before each treatment  ?? Please order cardiac, hypertension, asthma, and GERD medications to be given with a sip of water in the AM before each treatment  ?? Patients may also take thyroid medications prior to treatment with a sip of water  ?? Obtain informed consent and place in patient's paper chart (can utilize the old ECT consent form as a guide for risk/benefit, but will need formal consent printed from Epic  ?? XR Spine ECT Workup (if none on file within the past 1 year)  ?? CT/MRI head wo contrast (if none on file within the past 1 year)  ?? EKG (if none on file within the past 1year)  ?? Bloodwork (if none on file within the past year) to include CBC, CMP, TSH, Vitamin B12, Folate, Vitamin D  ?? If pt has insulin dependent diabetes, please consult endocrinology to discuss recommendations for insulin the morning of treatment  ?? Administer rating scales, and document in your note:  ?? Beck Depression Inventory (BDI), at baseline, then weekly. Please document this in the flowsheet so it can be tracked in Epic (Patient chart --> Rarely Used --> Psych Scales --> Adult Scales --> Mood Symptoms Screening Adult -->Beck Depression Inventory-II).   ?? Use HAM-D or Young Mania Rating Scale (YMRS) if patient unable to complete BDI  ?? Folstein Mini Mental Status Exam, at baseline, then weekly thereafter. Please document this in the flowsheet in Epic so it can be tracked Patient chart --> Rarely Used --> Psych Scales --> Adult Scales --> Cognitive Symptoms Screening --> MMSE).  ?? Appropriate catatonia rating scale if appropriate. Please document this in the daily progress note.         Time spent on counseling/coordination of care: 1 Hour  Total time spent with patient: 1 Hour 30 Minutes     Alexis Baptista Marchia Bond, MD,   Consulting Psychiatrist      Subjective     Reason for Consult: ECT evaluation    Chief Complaint: Depression and Anxiety    HPI: Alexis Burns is a 56 y.o. female with a history of depression referred for interventional psychiatry consultation for consideration of ECT, TMS, intranasal esketamine or another interventional psychiatric service managed by the interventional psychiatry team.      Patient reports she has been feeling very depressed, angry, dissatisfied, and emotional in the past six months.  Says these are her typical  depression symptoms except ten times worse.  Says this is the worst depression of her life.  Says there are some difficult family issues contributing to her low mood.  Does say she has dealt with depression on and off since she was 25, and in the past medications have helped.  Says right now it feels like she is not taking medications. Feels depressed all the time and says she is unable to enjoy anything.  Says she has been spending most of the day in bed.  Says when her husband comes home from work she is crying, irrational.  Not eating much, watches TV and goes to sleep at 9 PM.  Rarely leaves the house.  Sleeps at least 12 hours per day.  Says recently she has had daily thoughts of suicide.  Says she is not going to hurt herself.  Has thought about overdosing, not stockpiling medications.  Says family and dog prevent her from acting on these thoughts.  Does say she can stay safe.  Reports anxiety is new in the past year and says clonazepam is the only thing that helps.  Denies psychosis, mania.  Does have a therapist and says she is working through past traumas.  Feels worse in the few days after therapy.  No substance use.  Has noticed significant brain fog which she attributes to shoulder surgery about a year ago.  Rarely taking trazodone.    PAST PSYCHIATRIC HISTORY:  Prior psychiatric diagnoses: depression, anxiety  Psychiatric hospitalizations: 3-4 (after each parent died, 13 year old son told her that he didn't believe she was depressed and cut off contact)  Suicide attempts: 2 (one as an adolescent, one about 15 years ago via overdose)  Previous therapy: current therapy  Previous ECT: none   Previous TMS: none  Previous Ketamine or Intranasal esketamine: none    PSYCHIATRIC MEDICATION TRIALS:     Antidepressants:    Medication Drug Class Length of Trial Max Dose Effectiveness Side effects   Zoloft/sertraline SSRI   no  some   Cymbalta/duloxetine SNRI   partial  no   Prozac/fluoxetine SSRI   partial  no   Eskalith/lithium Other: mood stabilizer   no  intolerable   Pristiq/desvenlafaxine SSRI   no  some       Augmentation:     Medication Drug Class Length of Trial Max Dosage Effectiveness Side effects   Abilify/aripiprazole Atypical Antipsychotic   partial  intolerable   Buspar/buspirone Other: anxiolytic   no  no   Risperdal/risperidone Atypical Antipsychotic   limited  minimal   Seroquel/quetiapine Atypical Antipsychotic   limited  minimal     Allergies:  Haldol [haloperidol lactate], Cyclosporine, Tetracycline, and Tramadol    For ECT specifically, allergy to propofol, ketamine, etomidate, succinycholine: Denies  For TMS specifically, allergy to NSAIDs for headache: Denies  For intranasal esketamine, allergy to ketamine: Denies    Medications:   Current Outpatient Medications   Medication Sig Dispense Refill   ??? acetaminophen (TYLENOL) 500 MG tablet Take by mouth.     ??? albuterol HFA 90 mcg/actuation inhaler Inhale 2 puffs every six (6) hours as needed for wheezing. 8 g 0   ??? amLODIPine (NORVASC) 10 MG tablet Take 1 tablet (10 mg total) by mouth daily. For high blood pressure 90 tablet 3   ??? clonazePAM (KLONOPIN) 0.5 MG tablet Take 1 tablet (0.5 mg total) by mouth Three (3) times a day as needed for anxiety (panic). 90 tablet 1   ??? desvenlafaxine succinate (  PRISTIQ) 100 MG 24 hr tablet Take 1 tablet (100 mg total) by mouth daily. 30 tablet 2   ??? ibuprofen (ADVIL,MOTRIN) 200 MG tablet Take 1 tablet (200 mg total) by mouth every six (6) hours as needed for pain. Taken 4 po PRN (Patient not taking: Reported on 07/10/2021)     ??? lactulose 10 gram/15 mL solution Take 30 mL (20 g total) by mouth Three (3) times a day. 300 mL 0   ??? lactulose 10 gram/15 mL solution Take 30 mL (20 g total) by mouth Three (3) times a day. 2700 mL 11   ??? MAGNESIUM ORAL Take 30 mg by mouth Two (2) times a day. (Patient not taking: Reported on 07/10/2021)     ??? nystatin (MYCOSTATIN) 100,000 unit/gram powder Apply to affected area 3 times daily (Patient not taking: Reported on 07/10/2021) 15 g 0   ??? potassium chloride 20 MEQ CR tablet Take 1 tablet (20 mEq total) by mouth Two (2) times a day. (Patient not taking: Reported on 07/10/2021)     ??? XIFAXAN 550 mg Tab Take 1 tablet (550 mg total) by mouth Two (2) times a day. 180 tablet 4   ??? SUMAtriptan (IMITREX) 50 MG tablet Take 1 tablet (50 mg total) by mouth every two (2) hours as needed for migraine. 10 tablet 11   ??? traZODone (DESYREL) 50 MG tablet TAKE 1 TABLET NIGHTLY AS NEEDED FOR SLEEP, AND TAKE ONE-HALF (1/2) TABLET UP TO TWICE A DAY AS NEEDED FOR ANXIETY (Patient taking differently: 0.5 tablets (25 mg total) nightly. Taking 1/2 tab at night) 180 tablet 3     No current facility-administered medications for this visit.       For ECT, currently taking:   ??? Benzodiazepines? Yes, taking clonazepam BID - instructed to hold after 6 PM the night before treatment  ??? Antiepileptic medications? no  ??? Antihypertensives? amlodipine  ??? Cardiac medications? None  ??? Acid reflux/GERD medications? none  ??? Any other medications that raise the seizure threshold? none    Past Medical History:  Past Medical History:   Diagnosis Date   ??? ADD (attention deficit disorder)    ??? Anxiety Since childhood   ??? Arthritis 10 years   ??? Asthma     cough variant asthma per pt report   ??? Binge eating disorder    ??? Cirrhosis (CMS-HCC)    ??? Depression 04/11/2016   ??? Hyperlipidemia 20 years    Not interested in meds   ??? Hypertension    ??? Obesity Since childhood       For ECT:  Cardiovascular disease: none  Known intracranial lesion: none  Stroke: none  Retinal detachment: none  Chronic pain: none  GERD: none  HTN: yes  Asthma: yes  Musculoskeletal issues, especially of neck: none  Glaucoma: none  Pregnancy (if able to become pregnant): none  Dental issues (caries, dentures): none  TMJ pathology: none  Cochlear implants or hearing aids: none  Corrective lenses: none  Thyroid issues: none    Surgical History:  Past Surgical History:   Procedure Laterality Date   ??? BREAST BIOPSY Left     many biopsies   ??? BREAST CYST ASPIRATION Right 2003    abcess   ??? BREAST EXCISIONAL BIOPSY Left 2014ish   ??? CESAREAN SECTION     ??? CHOLECYSTECTOMY     ??? HERNIA REPAIR  2020   ??? HYSTERECTOMY  2001    partial   ??? OOPHORECTOMY  still has one ovary   ??? PR COLONOSCOPY W/BIOPSY SINGLE/MULTIPLE N/A 04/19/2020    Procedure: COLONOSCOPY, FLEXIBLE, PROXIMAL TO SPLENIC FLEXURE; WITH BIOPSY, SINGLE OR MULTIPLE;  Surgeon: Luanne Bras, MD;  [facility];  Service: Gastroenterology   ??? PR REPAIR INCISIONAL HERNIA,STRANG Midline 09/25/2018    Procedure: REPAIR INITIAL INCISIONAL OR VENTRAL HERNIA; INCARCERATED OR STRANGULATED;  Surgeon: Colon Branch, MD;  Location: [facility];  Service: General Surgery   ??? PR SHLDR ARTHROSCOP,SURG,W/ROTAT CUFF REPR Left 09/25/2020    Procedure: ARTHROSCOPY, SHOULDER, SURGICAL; WITH ROTATOR CUFF REPAIR;  Surgeon: Tomasa Rand, MD;  [facility];  Service: Orthopedics   ??? PR UPPER GI ENDOSCOPY,DIAGNOSIS N/A 04/19/2020    Procedure: UGI ENDO, INCLUDE ESOPHAGUS, STOMACH, & DUODENUM &/OR JEJUNUM; DX W/WO COLLECTION SPECIMN, BY BRUSH OR WASH;  Surgeon: Luanne Bras, MD;  Location: [facility];  Service: Gastroenterology   ??? 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 Pt understands this and expressed agreement.     Family History:  The patient's family history includes Alcohol abuse in her father and paternal grandmother; Arthritis in her mother; Cancer in her father, mother, and son; Colon cancer (age of onset: 93) in her father; Depression in her father and son; Diabetes in her mother and son; Drug abuse in her son; Heart disease in her paternal grandfather; Hypertension in her father, mother, and son; Intellectual Disability in her son; Kidney disease in her mother and son; Learning disabilities in her son; Lymphoma in her mother; Macular degeneration in her father, sister, and sister; Mental illness in her son and son; Migraines in her sister and sister; Miscarriages / Stillbirths in her sister; Neuropathy in her son; Parkinsonism in her father and sister; Seizures in her sister; Skin cancer in her maternal grandmother, mother, and sister; Stroke in her maternal grandmother; Tremor in her sister and sister; Vision loss in her father, mother, sister, sister, and son..    ROS:   Constitutional: Denies fever/chills, weight loss and fatigue  HEENT: Denies visual disturbances and hearing change  Endocrine: Denies hot flashes skin changes unexpected weight changes  Cardiac: Denies chest pain and palpitations  Lungs: Denies shortness of breath  and dyspnea  GI: Denies nausea, vomiting , diarrhea  and constipation  GU: Denies dysuria  Musculoskeletal: Denies muscle aches and weakness  Neuro: Denies involuntary movements, tremor, weakness/numbness, headaches and dizziness    For patients who are capable of becoming pregnant, is there a chance currently pregnant? No    Objective     PE:  Vitals:  There were no vitals filed for this visit.    Gen: NAD  Pulm: No abnormal work of breathing  Neuro: No obvious tics, tremors noted. Gait not assessed 2/2 video visit      Mental Status Exam:  Appearance:  ??  No apparent distress, Well nourished, Well developed and tearful and initially distressed   Attitude/Behavior: ??  Cooperative and Engaged   Psychomotor: ??  No abnormal movements   Speech/Language:  ??  Normal rate, volume, tone, fluency and Language intact, well formed   Mood: ??  Depressed   Affect: ??  depressed but reactive, tearful    Thought process: ??  Logical, linear, clear, coherent, goal directed   Thought content:   ??  endorses chronic SI that is passive, previous episodes of active SI   Perceptual disturbances:   ??  Denies auditory and visual hallucinations and Behavior not concerning for response to internal stimuli   Orientation: ??  Grossly oriented   Attention: ??  Able to fully concentrate and attend   Concentration ??  Concentration grossly intact, did not formally assess   Memory: ??  Grossly Intact    Fund of knowledge:  ??  Consistent with level of education and development   Insight:   ??  Fair   Judgment:  ??  Fair   Impulse Control: ??  Fair   ??        Test Results:  Psychometrics:  PHQ-9 Score:     Data Review:    Lab Results   Component Value Date    NA 143 07/05/2021    K 3.9 07/05/2021    CL 107 07/05/2021    CO2 27.8 07/05/2021     Lab Results   Component Value Date    WBC 4.9 07/05/2021    RBC 4.79 07/05/2021    HGB 14.1 07/05/2021    HCT 42.5 07/05/2021    MCV 88.7 07/05/2021    MCH 29.4 07/05/2021    MCHC 33.2 07/05/2021    RDW 13.6 07/05/2021    PLT 83 (L) 07/05/2021    MPV 8.2 07/05/2021     Lab Results   Component Value Date    FOLATE 9.8 11/05/2019     Lab Results   Component Value Date    VITAMINB12 428 04/09/2021     Lab Results   Component Value Date    A1C 4.5 06/05/2021     Vit D, 1,25-Dihydroxy   Date Value Ref Range Status   08/12/2017 52 18 - 78 pg/mL Final     Comment:        -------------------ADDITIONAL INFORMATION-------------------  This test was developed and its performance characteristics   determined by [facility] in a manner consistent with CLIA   requirements. This test has not been cleared or approved by   the U.S. Food and Drug Administration.     Test Performed by:  [facility]  [address] [place], [place], [place] [address]         ECG:   Encounter Date: 07/18/21   ECG 12 lead   Result Value    EKG Systolic BP     EKG Diastolic BP     EKG Ventricular Rate 82    EKG Atrial Rate 82    EKG P-R Interval 182    EKG QRS Duration 72    EKG Q-T Interval 380    EKG QTC Calculation 443    EKG Calculated P Axis 49    EKG Calculated R Axis -4    EKG Calculated T Axis 44    QTC Fredericia 421    Narrative    NORMAL SINUS RHYTHM  LOW VOLTAGE QRS  BORDERLINE ECG  WHEN COMPARED WITH ECG OF 04-Nov-2019 18:23,  NO SIGNIFICANT CHANGE WAS FOUND  Confirmed by Mariane Baumgarten ([id number]) on 07/19/2021  8:53:49 AM       Spine films:.     Impression   Grade 1 anterolisthesis of L4 and L5 with pars articularis defects.   ??   Multilevel degenerative disc disease, most prominent within the cervical spine.       Head CT/MRI:    Impression   No acute intracranial abnormality.        Polysomnography (Standard)  Table formatting from the original result was not included.  Images from the original result were not included.    Identification  Last Name: Burns     First Name: Alexis Sex: Female   Date of Birth: 04-04-1965 Age: 17 year   MR#: [id number] Weight: 237.0 lbs Height: 5' 5   File Name: [id number] BMI: 39.5    Rec. Date/Time: 05/05/2021           Interpreting Phys.: Luz Brazen. Dan Humphreys, MD     Referring Phys.: Sheral Flow [phone])     Date Dictated: May 07, 2021       History   This is a routine polysomnogram. The patient is a 57 year old, who is   being evaluated for sleep disordered breathing.  Other Known Medical Issues:   Essential hypertension  Chronic migraine  Mild intermittent asthma without complication  Cirrhosis (CMS-HCC)  Folliculitis of right axilla  Skin yeast infection  Attention deficit disorder  Binge eating disorder  Class 3 obesity (CMS-HCC)  Hyperlipidemia, unspecified  Severe recurrent major depression 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)  Abdominal hernia  Ventral hernia without obstruction or gangrene  Hypokalemia  Anxiety  Prediabetes  Suspected sleep apnea               Mild cognitive impairment    Medications: acetaminophen (TYLENOL) 500 MG tablet  albuterol HFA 90 mcg/actuation inhaler  amLODIPine (NORVASC) 10 MG tablet  diazePAM (VALIUM) 5 MG tablet  FLUoxetine (PROZAC) 20 MG capsule  nystatin (MYCOSTATIN) 100,000 unit/gram powder  semaglutide (OZEMPIC) 1 mg/dose (4 mg/3 mL) PnIj injection  SUMAtriptan (IMITREX) 50 MG tablet  traZODone (DESYREL) 50 MG tablet    []  The patient brought home medications with them to their sleep study.   The technologist reviewed the list of medications the        patient   anticipates to take during their stay. The patient was instructed to   inform the technologist when they take their evening or overnight   medications.    []  A lock box was provided for the patient in their room for securing   medication or small valuables during their stay. The patient was informed   and acknowledged the lockbox was available to them as well as available   instructions for use.    Epworth Sleepiness Scale (total score): 14    Routine Bedtime:  11 pm - 12 am   Routine Rise Time: 9 - 11 am    Previous Night's  Bedtime: 12 am  Rise Time: 9 am  Typical Sleep  X Yes      []  No     Are you currently on CPAP?   []   Yes    X  No     If so, what pressure?   Mask:     Daytime Activity   Caffeine Intake: O   Naps:    O      (total minutes)   Alcohol: O  Exercise:   No    duration:     Anything happen out of the  ordinary: No    Do you need to be up by a certain time tomorrow?:    X  Yes    []  No.      If Yes, What time? 6 am      How do you feel tonight?: Normal / O-kay    Any nasal congestion?    []   Yes     X  No    Procedure   A multi-channel polysomnogram was recorded digitally and stored using a   [employer] polygraph. The input montage provided multiple recording   channels of central, temporal and occipital EEG, EOG, submental EMG, arm   and leg limb surface EMG, airflow from nasal pressure and nasal/oral   thermocouple, intercostal EMG, chest and abdominal movement via   respiratory impedance plethysmography belts, end tidal CO2 via a BCI   capnograph sampled through a nasal cannula, arterial blood oxygen via a   finger probe, and EKG via a modified Lead I. The polysomnograph was   reviewed in multiple montages. Time locked digital video was recorded with   the polysomnogram and selected segments reviewed. The study was scored   using the AASM 2020 guidelines.    Study Results   The study started at 10:43:56 PM and ended at 05:57:36 AM. At the time of   initiation of the study the heart rate was 75 bpm, respiratory rate was 14   bpm, End Tidal CO2 was 40 torr and the oxygen saturation was 94%.   The total recording time was 433.7 minutes with a total sleep time of   377.5 minutes. The patient's sleep latency was 22.7 minute(s) with 33.0   minute(s) of wake time recorded after sleep onset. The REM latency was   276.5 minutes. The sleep efficiency was 87.0%.   Total wake time during the night was 56.5 minute(s), which was 7.9% of   the total recording time. The sleep stage percentages are as follows:   Stage N1 = 2.8%, Stage N2 = 55.2%, Stage N3= 36.2%, and Stage R (REM   sleep) = 5.8%. The overall sleep architecture showed the majority of slow   wave sleep to be in the early part of the night and the majority of the   REM sleep to be in the latter part of the night.     Respiratory   The patient had a total of 32 respiratory event(s) for an AHI of 5.1 per   hour. There were - obstructive apnea(s), - mixed apnea(s), - central   apnea(s) and 32 hypopnea(s). 5 event(s) occurred in Stage REM, 27 event(s)   were noted in NREM. Total AI was -. These respiratory events were   associated with arousals and oxygen desaturation to a low of 85.0%. A   total of - RERAs were noted for a RDI of 5.1. Cheyne Stokes was not noted.     The patient spent 84.0 minute(s) in sleep supine position with a total of   9 events in NREM sleep in the supine position and 4 events in REM sleep in   the supine position. The supine AHI is 9.3 event(s) per hour.   The patient spent 293.5 minute(s) in a side body position with 18 events   in NREM sleep in the lateral decubitus position and 1 events in REM sleep   in the lateral decubitus position. The lateral positional AHI is 3.9   event(s) per hour.   The patient spent - minute(s) in the prone  position with - events in NREM   sleep in the prone position and - events in REM sleep in the prone   position. The prone AHI is - event(s) per hour.    The patient spent 355.5 minutes in NREM sleep with 27 events during NREM   sleep. The NREM AHI is 4.6 event(s) per hour.   The patient spent 22.0 minute(s) in REM sleep, of which 9.5 minutes were   supine and 12.5 minutes were lateral. 5 event(s) occurred during REM   sleep. The REM AHI is 13.6, REM supine AHI is 25.3, and REM lateral AHI is   4.8 event(s) per hour.    The average O2 saturation (SpO2) for the night was 90.7%. For 63.6% of   the night at saturation over 90% was monitored, for 36.4% of the night the   saturation ranged between 80% and 90% and 0.0% of the night was spent at a saturation between 50% and 80%. The total time spent with O2 saturation   =<88% was 9.8 minutes.   The average End Tidal CO2 for the night was 40.8 torr, with a total of   0.0 minutes (0.0% of the night) spent at level of 50 torr or above. For   0.0% of the night, end tidal CO2 ranged from 50-55 torr, for 0.0% of the   night end tidal CO2 ranged from 55-65 torr, and for 0.0% end tidal CO2 was   65 torr and above.     Limb Movements   There were a total of 44 periodic limb movement events with a PLM Index   of 7.0, 3 of which were associated with arousals, which calculated to 0.5   event(s) per hour during sleep. There were a total of 63 limb movement   events with an index of 10.0. There were 21 spontaneous arousal(s) noted   with an index of 3.3 arousal(s) per hour of sleep.     Other Associated Events   The average heart rate in sleep was 74.1 with the average in REM sleep   80.7 and NREM 73.7. The peak heart rate in sleep was 129.0. The patient   had no episodes of bradycardia or tachycardia. The patient had no events   of gastroesophageal reflux, cardiac arrhythmias or epileptiform activity   on routine review of the study. The patient had no events suggestive of   parasomnia disorder and no events of bruxism were noted.    Impressions  Positional sleep apnea (Overall AHI = 5.1/ hour, Supine AHI = 9.3/ hour,   Lateral AHI = 3.9/ hour). The lateral position, included REM sleep.  Apnea   was associated with oxygen desaturations to a low of 85%, arousals, and   disruption of sleep architecture.      Recommendations  The patient might be considered for initial conservative measures for   treatment of apnea, which in this patient's case, would include strict   lateral positional therapy. This can be reinforced by use of a side   sleeper belt if needed available from commercial vendors such as ZZOMA,   REMATEE, and SLUMBERBUMP.  In addition, weight loss would be very   beneficial, and any nasal congestion even if mild should be aggressively   treated.  If the conservative measures are not successful, and the patient   remains symptomatic, or if the patient desires more definitive therapy,   then the patient could be considered for other treatments for apnea   including PAP therapy  and dental appliance.  The patient could also be advised that respiratory suppressant substances   can aggravate apnea and should be avoided. These include, but are not   limited to, opioid analgesics, muscle relaxants, and benzodiazepines.    _______________________________M.D.  Nathan A. Dan Humphreys, MD

## 2021-07-18 ENCOUNTER — Ambulatory Visit: Admit: 2021-07-18 | Discharge: 2021-07-19 | Payer: PRIVATE HEALTH INSURANCE

## 2021-07-18 DIAGNOSIS — F411 Generalized anxiety disorder: Principal | ICD-10-CM

## 2021-07-18 DIAGNOSIS — F332 Major depressive disorder, recurrent severe without psychotic features: Principal | ICD-10-CM

## 2021-07-19 ENCOUNTER — Encounter
Admit: 2021-07-19 | Discharge: 2021-08-03 | Disposition: A | Discharge status: Home / Self Care | Payer: PRIVATE HEALTH INSURANCE | Attending: Certified Registered"

## 2021-07-19 ENCOUNTER — Encounter: Admit: 2021-07-19 | Payer: PRIVATE HEALTH INSURANCE

## 2021-07-19 ENCOUNTER — Encounter
Admit: 2021-07-19 | Discharge: 2021-08-03 | Disposition: A | Discharge status: Home / Self Care | Payer: PRIVATE HEALTH INSURANCE

## 2021-07-19 ENCOUNTER — Ambulatory Visit: Admit: 2021-07-19 | Payer: PRIVATE HEALTH INSURANCE

## 2021-07-19 ENCOUNTER — Encounter: Admit: 2021-07-19 | Payer: PRIVATE HEALTH INSURANCE | Attending: Anesthesiology

## 2021-07-19 ENCOUNTER — Ambulatory Visit: Admit: 2021-07-19 | Discharge: 2021-08-03 | Payer: PRIVATE HEALTH INSURANCE

## 2021-07-19 ENCOUNTER — Ambulatory Visit
Admit: 2021-07-19 | Discharge: 2021-08-03 | Disposition: A | Discharge status: Home / Self Care | Payer: PRIVATE HEALTH INSURANCE

## 2021-07-20 LAB — CBC W/ AUTO DIFF
BASOPHILS ABSOLUTE COUNT: 0 10*9/L (ref 0.0–0.1)
BASOPHILS RELATIVE PERCENT: 0.8 %
EOSINOPHILS ABSOLUTE COUNT: 0.1 10*9/L (ref 0.0–0.5)
EOSINOPHILS RELATIVE PERCENT: 3.6 %
HEMATOCRIT: 39.8 % (ref 34.0–44.0)
HEMOGLOBIN: 13.4 g/dL (ref 11.3–14.9)
LYMPHOCYTES ABSOLUTE COUNT: 1.5 10*9/L (ref 1.1–3.6)
LYMPHOCYTES RELATIVE PERCENT: 38.8 %
MEAN CORPUSCULAR HEMOGLOBIN CONC: 33.7 g/dL (ref 32.0–36.0)
MEAN CORPUSCULAR HEMOGLOBIN: 29.6 pg (ref 25.9–32.4)
MEAN CORPUSCULAR VOLUME: 87.7 fL (ref 77.6–95.7)
MEAN PLATELET VOLUME: 8.4 fL (ref 6.8–10.7)
MONOCYTES ABSOLUTE COUNT: 0.3 10*9/L (ref 0.3–0.8)
MONOCYTES RELATIVE PERCENT: 6.6 %
NEUTROPHILS ABSOLUTE COUNT: 2 10*9/L (ref 1.8–7.8)
NEUTROPHILS RELATIVE PERCENT: 50.2 %
PLATELET COUNT: 74 10*9/L — ABNORMAL LOW (ref 150–450)
RED BLOOD CELL COUNT: 4.53 10*12/L (ref 3.95–5.13)
RED CELL DISTRIBUTION WIDTH: 14 % (ref 12.2–15.2)
WBC ADJUSTED: 3.9 10*9/L (ref 3.6–11.2)

## 2021-07-20 LAB — BASIC METABOLIC PANEL
ANION GAP: 6 mmol/L (ref 5–14)
BLOOD UREA NITROGEN: 13 mg/dL (ref 9–23)
BUN / CREAT RATIO: 19
CALCIUM: 9.2 mg/dL (ref 8.7–10.4)
CHLORIDE: 112 mmol/L — ABNORMAL HIGH (ref 98–107)
CO2: 29 mmol/L (ref 20.0–31.0)
CREATININE: 0.7 mg/dL
EGFR CKD-EPI (2021) FEMALE: >90 mL/min/{1.73_m2} (ref >=60)
GLUCOSE RANDOM: 97 mg/dL (ref 70–179)
POTASSIUM: 3.9 mmol/L (ref 3.4–4.8)
SODIUM: 147 mmol/L — ABNORMAL HIGH (ref 135–145)

## 2021-07-20 LAB — VITAMIN B12: VITAMIN B-12: 474 pg/mL (ref 211–911)

## 2021-07-20 LAB — TSH: THYROID STIMULATING HORMONE: 1.136 u[IU]/mL (ref 0.550–4.780)

## 2021-07-20 MED ADMIN — acetaminophen (TYLENOL) tablet 650 mg: 650 mg | ORAL | @ 16:00:00

## 2021-07-20 MED ADMIN — traZODone (DESYREL) tablet 50 mg: 50 mg | ORAL | @ 01:00:00

## 2021-07-20 MED ADMIN — sodium chloride (NS) 0.9 % infusion: INTRAVENOUS | @ 15:00:00 | Stop: 2021-07-20

## 2021-07-20 MED ADMIN — hydrOXYzine (ATARAX) tablet 25 mg: 25 mg | ORAL | @ 18:00:00

## 2021-07-20 MED ADMIN — succinylcholine (ANECTINE) injection: INTRAVENOUS | @ 15:00:00 | Stop: 2021-07-20

## 2021-07-20 MED ADMIN — ondansetron (ZOFRAN) injection: INTRAVENOUS | @ 15:00:00 | Stop: 2021-07-20

## 2021-07-20 MED ADMIN — hydrOXYzine (ATARAX) tablet 25 mg: 25 mg | ORAL | @ 01:00:00

## 2021-07-20 MED ADMIN — aluminum-magnesium hydroxide-simethicone (MAALOX MAX) 80-80-8 mg/mL oral suspension: 30 mL | ORAL | @ 01:00:00

## 2021-07-20 MED ADMIN — rifAXIMin (XIFAXAN) tablet 550 mg: 550 mg | ORAL | @ 01:00:00 | Stop: 2021-08-18

## 2021-07-20 MED ADMIN — acetaminophen (TYLENOL) tablet 650 mg: 650 mg | ORAL | @ 01:00:00

## 2021-07-20 MED ADMIN — venlafaxine (EFFEXOR-XR) 24 hr capsule 75 mg: 75 mg | ORAL | @ 21:00:00

## 2021-07-20 MED ADMIN — propofoL (DIPRIVAN) injection: INTRAVENOUS | @ 15:00:00 | Stop: 2021-07-20

## 2021-07-20 MED ADMIN — rifAXIMin (XIFAXAN) tablet 550 mg: 550 mg | ORAL | @ 17:00:00 | Stop: 2021-08-18

## 2021-07-20 NOTE — Unmapped (Signed)
Recreational Therapy Evaluation  07/20/2021    Patient Name:  Alexis Burns [facility]       Medical Record Number: [id number]   Date of Birth: [birth year]  Sex: Female          Room/Bed:  4120/4120-01    Eval Duration: 13 Min.    Assessment  Patient would benefit from RT services to help patient develop and practice healthy coping mechanisms for managing emotions post discharge.    Plan of Care  Plan of Care Initiated: 07/20/21  1x per day for: 3-4x week   Planned Treatment Duration: 07/27/21    Motivators: Unable to Identify motivators  Patient's Identified Treatment Goal: Just to get through these treatments and see if they help me.  Patient's Stressors / Triggers: Patient presents with suicidal ideation with plan to shoot self.  Patient's reproted stressors are: 1 adult son doesn't speak to her, patient has been participating in therapy, which has brought up childhood traumas and patient's sisters do not believe the patient, patient with a history of multiple suicide attempts.  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  Unclear patient's motivation for inpatient treatment at time of evaluation.  Treatment Plan developed in collaboration with: Patient, Treatment Team  Interventions: Coping skills, Leisure, Mindfulness, Psychosocial counseling, Promoting social interaction, Refuting negative thoughts, Relapse prevention, Relaxation training, Stress management, Wellness / recovery     Goals:  1.  By 07/27/21, patient will demonstrate improved motivation for inpatient treatment by participating in at least 2 RT sessions for 15+ minutes/session.,       2.  By 07/27/21, patient will demonstrate 3 positive coping skills with no more than 2 cues required/occasion, for use in managing suicidal ideation, demonstrating improved ability to maintain safety outside the hospital setting.,       3. By 08/03/21, patient will verbalize plan to implement 2 healthy coping skills for use prn by patient to maintain safety post discharge.,         ,         Interventions: Coping skills, Leisure, Mindfulness, Psychosocial counseling, Promoting social interaction, Refuting negative thoughts, Relapse prevention, Relaxation training, Stress management, Wellness / recovery       Subjective    Current Situation: Patient with MDD and anxiety.  Currently receiving ECT.  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)  Living Environment: House  Lives With: Spouse, Son  Risk Factors: None  Precautions: Systems analyst / Environment: Patient not wearing mask for full session  Reports/displays signs/symptoms of pain?: No    Past Medical History:   Diagnosis Date    ADD (attention deficit disorder)     Anxiety Since childhood    Arthritis 10 years    Asthma     cough variant asthma per pt report    Binge eating disorder     Cirrhosis (CMS-HCC)     Depression 04/11/2016    Hyperlipidemia 20 years    Not interested in meds    Hypertension     Obesity Since childhood    Social History     Tobacco Use    Smoking status: Former     Packs/day: 1.00     Years: 15.00     Pack years: 15.00     Types: Cigarettes     Quit  date: 04/25/1994     Years since quitting: 27.2    Smokeless tobacco: Never   Substance Use Topics    Alcohol use: Not Currently      Past Surgical History:   Procedure Laterality Date    BREAST BIOPSY Left     many biopsies    BREAST CYST ASPIRATION Right 2003    abcess    BREAST EXCISIONAL BIOPSY Left 2014ish    CESAREAN SECTION      CHOLECYSTECTOMY      HERNIA REPAIR  2020    HYSTERECTOMY  2001    partial    OOPHORECTOMY      still has one ovary    PR COLONOSCOPY W/BIOPSY SINGLE/MULTIPLE N/A 04/19/2020    Procedure: COLONOSCOPY, FLEXIBLE, PROXIMAL TO SPLENIC FLEXURE; WITH BIOPSY, SINGLE OR MULTIPLE;  Surgeon: Luanne Bras, MD;  Location: [facility];  Service: Gastroenterology    PR REPAIR INCISIONAL HERNIA,STRANG Midline 09/25/2018    Procedure: REPAIR INITIAL INCISIONAL OR VENTRAL HERNIA; INCARCERATED OR STRANGULATED;  Surgeon: Colon Branch, MD;  Location: [facility];  Service: General Surgery    PR SHLDR ARTHROSCOP,SURG,W/ROTAT CUFF REPR Left 09/25/2020    Procedure: ARTHROSCOPY, SHOULDER, SURGICAL; WITH ROTATOR CUFF REPAIR;  Surgeon: Tomasa Rand, MD;  Location: [facility] OR [facility];  Service: Orthopedics    PR UPPER GI ENDOSCOPY,DIAGNOSIS N/A 04/19/2020    Procedure: UGI ENDO, INCLUDE ESOPHAGUS, STOMACH, & DUODENUM &/OR JEJUNUM; DX W/WO COLLECTION SPECIMN, BY BRUSH OR WASH;  Surgeon: Luanne Bras, MD;  Location: [facility];  Service: Gastroenterology    WISDOM TOOTH EXTRACTION      Family History   Problem Relation Age of Onset    Lymphoma Mother     Diabetes Mother     Hypertension Mother     Skin cancer Mother     Arthritis Mother     Cancer Mother         lymph    Kidney disease Mother     Vision loss Mother         Macular D.    Parkinsonism Father     Hypertension Father     Colon cancer Father 48    Macular degeneration Father     Alcohol abuse Father     Cancer Father         colon    Depression Father     Vision loss Father         Macular D.    Parkinsonism Sister     Tremor Sister         essential tremor, hands and voice    Seizures Sister     Migraines Sister     Macular degeneration Sister     Miscarriages / [place] Sister     Vision loss Sister         Macular d    Skin cancer Sister     Tremor Sister         no diagnosis    Migraines Sister     Macular degeneration Sister     Vision loss Sister         Macular d    Skin cancer Maternal Grandmother     Stroke Maternal Grandmother     Alcohol abuse Paternal Grandmother     Heart disease Paternal Grandfather     Cancer Son         Spinal cancer, leaving pt disabled  Diabetes Son Mental illness Son     Neuropathy Son     Depression Son     Drug abuse Son     Hypertension Son     Kidney disease Son     Vision loss Son         Diabetes    Mental illness Son     Intellectual Disability Son     Learning disabilities Son         Allergies: Haldol [haloperidol lactate], Cyclosporine, Tetracycline, and Tramadol     Objective    Cognitive  Stage of change / level of insight: Contemplation  Thought Process/Content: Organized, Suicidal Ideation(SI)  Judgment: Poor  Memory: Independent with recall (per H&P, patient reports no short-term memory)  Follows Directions: Able to follow directions independently  Attention Span/Alertness : Able to attend to RT assessment  Orientation: Oriented to person, Oriented to place, Oriented to situation    Communication  Communication Barriers: None noted    Emotional  Mood: Depressed  Affect: Congruent  Anxiety Management : Reports no anxiety  Pain Management : Reports no pain  Coping Skills : Reports no effective coping strategies and/or unhealthy coping strategies  Motivated to learn new coping strategies?: Indifferent  Body Image/Self concept: Poor concept of self, Influenced by situational triggers  Emotional Expression: Expresses feelings with cues/resources    Physical Domain  Vision: Wears glasses for reading only  Mobility Comments: ambulates independently, activity as tolerated  Equipment/Device needs: no assistive device needed  Additional Physical Domain comments: asthma, NAFLD    Social  Support system: Reports positive support system (patient reports her husband and her son that lives with them as main supports.  Patient is also followed by [facility] clinic for outpatient care.)  Patient Behaviors and Interactions: Isolated  Ability to form relationship / interact with others: Isolated to room  Additional Social Domain Comments: Patient presented in bed (patient was post-ECT), covers completing covering head and body, for duration of evaluation. Leisure and Life Function  Level of involvement: Occasional participation (I like watching movies, sometimes drawing or reading.  Patient asked for a sketch book from this Clinical research associate.)  Motivation to engage in leisure / play: Limited      I attest that I have reviewed the above information.  Signed by Almyra Brace Roy-Farrug  Filed 07/20/2021

## 2021-07-20 NOTE — Unmapped (Signed)
Social Work  Psychosocial Assessment    Patient Name: Alexis Burns   Medical Record Number: [id number]   Date of Birth: 14-Dec-1965  Sex: Female     Referral  Referred by: Physician  Reason for Referral: Cognitive / Behavioral / Psych Concerns    Aeisha Minarik is a 56 year old female who presents for evaluation of worsening depression and ECT initiation. Mrs. Jagiello has a history of depression and multiple suicide attempts. She was last hospitalized at [facility] in 2021. Mrs. Girvan currently sees Dr. Katherine Mantle at the [facility] clinic for medication management, and [name] LCSW with [facility] for therapy.     Extended Emergency Contact Information  Primary Emergency Contact: Earll,[name]   [place]  Home Phone: [phone]  Mobile Phone: [phone]  Relation: Spouse  Interpreter needed? No    Legal Next of Kin / Guardian / POA / Advance Directives    HCDM (patient stated preference): Bango,[name] - Spouse - [phone]    Advance Directive (Medical Treatment)  Does patient have an advance directive covering medical treatment?: Patient has advance directive covering medical treatment, copy not in chart.    Health Care Decision Maker [HCDM] (Medical & Mental Health Treatment)  Healthcare Decision Maker: Patient does not wish to appoint a Health Care Decision Maker at this time  Information offered on HCDM, Medical & Mental Health advance directives:: Patient declined information.    Advance Directive (Mental Health Treatment)  Does patient have an advance directive covering mental health treatment?: Patient does not have advance directive covering mental health treatment.  Reason patient does not have an advance directive covering mental health treatment:: Patient does not wish to complete one at this time.    Discharge Planning  Discharge Planning Information:   Type of Residence   Mailing Address:  [address] [place] [address]    Medical Information   Past Medical History:   Diagnosis Date    ADD (attention deficit disorder)     Anxiety Since childhood    Arthritis 10 years    Asthma     cough variant asthma per pt report    Binge eating disorder     Cirrhosis (CMS-HCC)     Depression 04/11/2016    Hyperlipidemia 20 years    Not interested in meds    Hypertension     Obesity Since childhood       Past Surgical History:   Procedure Laterality Date    BREAST BIOPSY Left     many biopsies    BREAST CYST ASPIRATION Right 2003    abcess    BREAST EXCISIONAL BIOPSY Left 2014ish    CESAREAN SECTION      CHOLECYSTECTOMY      HERNIA REPAIR  2020    HYSTERECTOMY  2001    partial    OOPHORECTOMY      still has one ovary    PR COLONOSCOPY W/BIOPSY SINGLE/MULTIPLE N/A 04/19/2020    Procedure: COLONOSCOPY, FLEXIBLE, PROXIMAL TO SPLENIC FLEXURE; WITH BIOPSY, SINGLE OR MULTIPLE;  Surgeon: Luanne Bras, MD;  Location: [facility];  Service: Gastroenterology    PR REPAIR INCISIONAL HERNIA,STRANG Midline 09/25/2018    Procedure: REPAIR INITIAL INCISIONAL OR VENTRAL HERNIA; INCARCERATED OR STRANGULATED;  Surgeon: Colon Branch, MD;  Location[facility] OR [facility];  Service: General Surgery    PR SHLDR ARTHROSCOP,SURG,W/ROTAT CUFF REPR Left 09/25/2020    Procedure: ARTHROSCOPY, SHOULDER, SURGICAL; WITH ROTATOR  CUFF REPAIR;  Surgeon: Tomasa Rand, MD;  [facility]: [facility];  Service: Orthopedics    PR UPPER GI ENDOSCOPY,DIAGNOSIS N/A 04/19/2020    Procedure: UGI ENDO, INCLUDE ESOPHAGUS, STOMACH, & DUODENUM &/OR JEJUNUM; DX W/WO COLLECTION SPECIMN, BY BRUSH OR WASH;  Surgeon: Luanne Bras, MD;  Location: [facility];  Service: Gastroenterology    WISDOM TOOTH EXTRACTION         Family History   Problem Relation Age of Onset    Lymphoma Mother     Diabetes Mother     Hypertension Mother     Skin cancer Mother     Arthritis Mother     Cancer Mother         lymph    Kidney disease Mother     Vision loss Mother         Macular D. Parkinsonism Father     Hypertension Father     Colon cancer Father 79    Macular degeneration Father     Alcohol abuse Father     Cancer Father         colon    Depression Father     Vision loss Father         Macular D.    Parkinsonism Sister     Tremor Sister         essential tremor, hands and voice    Seizures Sister     Migraines Sister     Macular degeneration Sister     Miscarriages / [place] Sister     Vision loss Sister         Macular d    Skin cancer Sister     Tremor Sister         no diagnosis    Migraines Sister     Macular degeneration Sister     Vision loss Sister         Macular d    Skin cancer Maternal Grandmother     Stroke Maternal Grandmother     Alcohol abuse Paternal Grandmother     Heart disease Paternal Grandfather     Cancer Son         Spinal cancer, leaving pt disabled    Diabetes Son     Mental illness Son     Neuropathy Son     Depression Son     Drug abuse Son     Hypertension Son     Kidney disease Son     Vision loss Son         Diabetes    Mental illness Son     Intellectual Disability Son     Learning disabilities Son        Surveyor, quantity Information   Primary Insurance: Payor: Acupuncturist / Plan: BCBS OOS PLAN / Product Type: *No Product type* /    Secondary Insurance: None   Prescription Coverage: Nurse, learning disability (listed above)   Preferred Pharmacy: [facility] - [place], [place] - [address]  [id number] - [place], [place] - [address] [place] [address] [place] [address]    Barriers to taking medication: No    Transition Home   Transportation at time of discharge: Family/Friend's Private Vehicle    Anticipated changes related to Illness: none   Services in place prior to admission: [facility]: [facility]   Services anticipated for DC: Community Mental  Health Services: resumption of outpatient MH care & ECT   Hemodialysis Prior to Admission: No    Readmission  Risk of Unplanned Readmission Score: UNPLANNED READMISSION SCORE: 6.5%  Readmitted Within the Last 30 Days?   Readmission Factors include: other: N/A    Social Determinants of Health  Social Determinants of Health     Financial Resource Strain: Low Risk     Difficulty of Paying Living Expenses: Not very hard   Internet Connectivity: Not on file   Food Insecurity: No Food Insecurity    Worried About Programme researcher, broadcasting/film/video in the Last Year: Never true    Barista in the Last Year: Never true   Tobacco Use: Medium Risk    Smoking Tobacco Use: Former    Smokeless Tobacco Use: Never    Passive Exposure: Not on file   Housing/Utilities: Low Risk     Within the past 12 months, have you ever stayed: outside, in a car, in a tent, in an overnight shelter, or temporarily in someone else's home (i.e. couch-surfing)?: No    Are you worried about losing your housing?: No    Within the past 12 months, have you been unable to get utilities (heat, electricity) when it was really needed?: No   Alcohol Use: Not At Risk    How often do you have a drink containing alcohol?: Never    How many drinks containing alcohol do you have on a typical day when you are drinking?: 1 - 2    How often do you have 5 or more drinks on one occasion?: Never   Transportation Needs: No Transportation Needs    Lack of Transportation (Medical): No    Lack of Transportation (Non-Medical): No   Substance Use: Not on file   Health Literacy: Low Risk     : Never   Physical Activity: Not on file   Interpersonal Safety: Not on file   Stress: Not on file   Intimate Partner Violence: Not on file   Depression: Not at risk    PHQ-2 Score: 2   Social Connections: Not on file       Social History  Support Systems/Concerns: Spouse, Family Members                          Military Service: No Field seismologist Affecting Healthcare: None reported    Medical and Psychiatric History  Psychosocial Stressors: Feelings of hopelessness about future and/or goals, Trauma (recent or history of), Family issues / concerns      Psychological Issues/Information: Mental illness   Concerns: Active mental illness concerns          Chemical Dependency: None              Outpatient Providers: Psychiatrist, Mental Health Therapist   Name / Contact #: : Dr. Katherine Mantle, [facility]; [name] LCSW [facility]  Legal: No legal issues      Ability to Access Community Services: No issues accessing community services        [place], [place] pager [phone]

## 2021-07-20 NOTE — Unmapped (Signed)
The patient went to ECT this morning at 9:17 am and returned to the unit after mid day. She appeared unsteady on her feet and the MD ordered the patient to be on falls precaution. Her morning medication was administered after she returned from ECT. The patient denied SI/HI and no AVH noted. She has been resting in bed post ECT. Staff will continue to monitor the patient.  Problem: Adult Behavioral Health Plan of Care  Goal: Plan of Care Review  Outcome: Progressing  Flowsheets (Taken 07/20/2021 1508)  Plan of Care Reviewed With: patient  Patient Agreement with Plan of Care: agrees  Goal: Patient-Specific Goal (Individualization)  Outcome: Progressing  Flowsheets (Taken 07/20/2021 1508)  Patient-Specific Goals (Include Timeframe): Patient's goal for this shift, Sleep  Patient Personal Strengths:   medication/treatment adherence   expressive of needs   expressive of emotions  Goal: Adheres to Safety Considerations for Self and Others  Outcome: Progressing  Goal: Absence of New-Onset Illness or Injury  Outcome: Progressing  Goal: Optimized Coping Skills in Response to Life Stressors  Outcome: Progressing  Goal: Develops/Participates in Therapeutic Alliance to Support Successful Transition  Outcome: Progressing  Goal: Rounds/Family Conference  Outcome: Progressing  Flowsheets (Taken 07/20/2021 1508)  Participants:   Milieu/Psych techs   nursing   patient   pharmacy   psychiatrist     Problem: Impaired Wound Healing  Goal: Optimal Wound Healing  Outcome: Progressing     Problem: Fall Injury Risk  Goal: Absence of Fall and Fall-Related Injury  Outcome: Progressing     Problem: Suicide Risk  Goal: Absence of Self-Harm  Outcome: Progressing  Intervention: Assess Risk to Self and Maintain Safety  Recent Flowsheet Documentation  Taken 07/20/2021 0849 by Lorayne Bender, RN  Behavior Management:   boundaries reinforced   behavioral plan reviewed

## 2021-07-20 NOTE — Unmapped (Addendum)
Reason for Admission: Alexis Burns is a 56 y.o. female with a history of MDD, anxiety, NASH cirrhosis, hypertension who was admitted voluntarily to the [facility] Crisis Stabilization unit directly from clinic for initiation of index series of ECT. Patient received 6 ECT treatments during admission, and will follow up with outpatient ECT at [facility] on discharge.  Please refer to the full History and Physical note for details.    Monitoring: The level of observation on unit was initially q15 min checks and off unit, Restrict to unit. The patient did not have any episodes of  aggression, agitation, self-injurious behavior, inappropriate behavior, and attempted elopement while admitted. The patient was able to maintain safe behavior on the unit before discharge, but in the setting of COVID19 pandemic and mask requirements for patients, safety checks are maintained at every 15 minutes.     Psychiatry: The patient was offered the following resources during their hospitalization: psychiatric physician and nursing services, clinical case management, occupational and recreational therapies,physical therapy, neurology consultation and  medical consultation . The aforementioned disciplines established multidisciplinary treatment plan(s) within 24 hours of admission. The treatment plans were discussed on a daily basis and formally updated weekly. The patient had access to individual, group, and milieu therapeutic modalities throughout admission.    Diagnostic clarification was achieved through serial mental status examinations, serial rating scales (including PHQ-9, MMSE, and BDI ), behavioral observations, collateral obtained from family, collateral obtained from outpatient providers, previous medical records. Rating scale results were reviewed, compared to clinical observations, and were used to inform treatment decisions.     The patient's presentation, including symptoms of significant depressed mood, cognitive delays, decreased appetite, poor sleep, passive SI appears to be most consistent with a diagnosis of  recurrent major depressive disorder. There was a discussion of side effects, risks, benefits, alternatives, and indications for treatment with sertraline, including but not limited to headache, nausea, vomiting, GI upset, sexual dysfunction. The patient asked appropriate questions, acknowledged understanding of answers, and provided informed consent to  discontinuing Pristiq and starting Sertraline  to target depressive symptoms. This medication was chosen because of prior medication trials with alternate medications, including Prozac, risperidone, lithium, Wellbutrin, Seroquel, Cytomel, duloxetine, apripirazole . Additional psychiatric medication changes included discontinuing clonazepam due to gait instability, concerns with short term memory and discontinued trazodone due to orthostatic hypotension.  Patient was given atarax 25 mg as needed for anxiety which worked well for her but led to some nightmares . With regard to medication tolerability, patient tolerated medications well.      During the patient's hospital course, there was improvement in depression.  The patient's participation in therapeutic groups, 1:1 interactions with staff, and family interactions were appropriate.    With the patient's consent, the team maintained contact with family members/social supports throughout the admission for ongoing inpatient management and disposition planning. The patient's husband Alexis Burns was last contacted on 08/03/2021.        The team maintained contact with outpatient mental health clinicians regarding the plan of care and disposition.      The patient discharged to home.      The patient will follow up with outpatient services, including outpatient ECT treatment and continued outpatient treatment at [facility] psychiatry.     The treatment team provided psycho-education and made recommendations regarding medication adherence, adaptive coping strategies, healthy lifestyle modifications , and educational interventions.     Recommendation for discharge safety planning included: removal of all firearms from the home, locking all sharps  in a secure container, locking all weapons and dangerous objects in a secure container, locking all medications in a secure container, and increasing level of supervision.    Psychiatric Medications on Discharge  Sertraline 50 mg for MDD  Hydroxyzine 25 mg daily PRN for anxiety        Other Medical Issues: Labs collected on admission were reviewed and found to be unremarkable. An EKG was performed and was unremarkable. Additional laboratory monitoring included: Vitamin B12 and TSH levels and  PT/INR.         The patient was monitored for physical complaints, including potential medication side effects. Transient physical complaints included headaches and migraines, which were relieved by sumatriptan and ibuprofen .       Medical:  # Vision loss/retro-orbital eye pain/headache  -Patient reports retro-orbital pain and decrease in vision with ECT treatments. Curbsided ophthalmology who stated pt experiencing minor transient IOP that should resolve over time. Curbsided neurology who stated given headache/retro-orbital pain resolves  and is transient also related to transient IOP and stated safe to continue with ECT and less concerns for increases in ICP. If patient begins to have persistent symptoms or worsens, recommend further evaluation.   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  Occurs when she walks and lingers. No shortness of breath, no chest pain, no redness, warmth to leg.  -- PVLs negative for clot     #Indigestion  - calcium carbonate TUMS , 200 mg TID w/ meals     #Decreased hearing   - follow up outpatient with ENT referral, ambulatory referral placed      Metabolic monitoring was not indicated on patient's current medication regimen.     Risk Assessment: On the day of discharge, the patient was evaluated by the attending MD and was discussed with the multidisciplinary treatment team. The treatment team has determined the patient to be stable and appropriate for discharge with medical approval.     Day of discharge assessment included a suicide and violence risk assessment. Risk factors for self-harm/suicide that are present at time of discharge include: current diagnosis of depression and chronic mental illness > 5 years.    A violence risk assessment was performed on the day of discharge. Risk factors for aggression/violence that are present at time of discharge include: N/A.     These risk factors are mitigated by the following protective factors:lack of active SI/HI, no history of violence, motivation for treatment, presence of a significant relationship, presence of an available support system, current treatment compliance, safe housing, support system in agreement with treatment recommendations, presence of a safety plan with follow-up care, and patient reports guns in home  that are unloaded and locked up with no access . Furthermore, the treatment team has attempted to mitigate risk through supportive psychotherapy, providing psycho-education, thoughtful medication management, and communication with outpatient providers for continuity of care. The team educated the patient about relevant modifiable risk factors, including following recommendations for treatment of psychiatric illness(es) and abstaining from substance use.     While future psychiatric events cannot be accurately predicted, the patient does not currently require further acute inpatient psychiatric care and does not currently meet [facility] involuntary commitment criteria. It is recommended that the patient continue treatment in outpatient care. A follow up plan and crisis plan are in place, have been discussed with the patient, and they agree to the plan at time of discharge.

## 2021-07-20 NOTE — Unmapped (Signed)
Patient is alert, oriented x 4, with labile mood from being anxious, upset and irritable to calm, pleasant and cooperative. Pt denies SI /HI and AVH. Took  scheduled medications and prn trazodone, atarax, maalox and tylenol were given with good result. NPO post midnight advised, for ECT in the morning. No falls / injury noted.      Care plan reviewed and 1:1 therapeutic intervention completed.

## 2021-07-20 NOTE — Unmapped (Signed)
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 with a history of Major depressive disorder, multiple suicide attempts, who presents for evaluation of Severe depression necessitating skilled OT services to increase engagement in health management based daily occupations that supports recovery post discharge   Today's Interventions: Education - Patient (Eval)    PLAN OF CARE  Plan of Care Initiated: 07/20/21  Follow-up / Frequency: 1-2x per day for: 3-4x week while in acute setting   Planned Treatment Duration: 08/10/21      OT Post Acute Discharge Recommendations: Skilled OT services NOT indicated  Additional Discharge Recommendations: Individual Therapy, Outpatient Mental Health Therapy, Outpatient Occupational Therapy   OT DME Recommendations: Rolling walker, Raised toilet seat, Tub Transfer Bench, Shower chair with back  Planned Interventions: Equities trader, ADL retraining, Education - Patient, Relapse prevention, Health management training, Emotion regulation Skills      Patient/Family Goals:   I want to feel like myself     Short Term Goals:   By 6/9, Pt will demonstrate the use of 4 calming strategies for improved emotional regulation.   Time Frame : 3 weeks   By 6/9/, Pt will identify 3 relapse prevention strategies in order to improve their insight, safety, and health management.   Time Frame : 3 weeks   By 6/9, Pt will develop x 3 strategies to manage health related challenges  with compensatory strategies to increase satisfaction in life roles   Time Frame : 3 weeks                             Planned Interventions:  Equities trader, ADL retraining, Education - Patient, Relapse prevention, Health management training, Emotion regulation Skills    Prognosis: Good   Positive Indicators: Support, Stable living environment, Employment history, Positive communication skills  Barriers: Difficulty modulating behavior, Medical co-morbidities, Lack of motivation    SUBJECTIVE  Patient / Caregiver reports: I'm here to get better  Communication Preference: Verbal, Written, Software engineer / Environment: Caregiver not wearing mask for full session, Patient not wearing mask for full session    Current Facility-Administered Medications   Medication Dose Route Frequency Provider Last Rate Last Admin    acetaminophen (TYLENOL) tablet 650 mg  650 mg Oral Q6H PRN Estelle Grumbles, MD   650 mg at 07/20/21 1155    aluminum-magnesium hydroxide-simethicone (MAALOX MAX) 80-80-8 mg/mL oral suspension  30 mL Oral Q6H PRN Estelle Grumbles, MD   30 mL at 07/19/21 2054    hydrOXYzine (ATARAX) tablet 25 mg  25 mg Oral Q6H PRN Estelle Grumbles, MD   25 mg at 07/19/21 2054    lactulose (CEPHULAC) packet 20 g  20 g Oral Daily PRN Maryjo Rochester, MD        rifAXIMin [employer]smith) tablet 550 mg  550 mg Oral BID Estelle Grumbles, MD   550 mg at 07/20/21 1255    traZODone (DESYREL) tablet 50 mg  50 mg Oral Nightly PRN Cyndee Brightly, MD   50 mg at 07/19/21 2054       Past Medical  History:   Diagnosis Date    ADD (attention deficit disorder)     Anxiety Since childhood    Arthritis 10 years    Asthma     cough variant asthma per pt report    Binge eating disorder     Cirrhosis (CMS-HCC)     Depression 04/11/2016    Hyperlipidemia 20 years    Not interested in meds    Hypertension     Obesity Since childhood      Social History     Tobacco Use    Smoking status: Former     Packs/day: 1.00     Years: 15.00     Pack years: 15.00     Types: Cigarettes     Quit date: 04/25/1994     Years since quitting: 27.2    Smokeless tobacco: Never   Substance Use Topics    Alcohol use: Not Currently     Past Surgical History:   Procedure Laterality Date BREAST BIOPSY Left     many biopsies    BREAST CYST ASPIRATION Right 2003    abcess    BREAST EXCISIONAL BIOPSY Left 2014ish    CESAREAN SECTION      CHOLECYSTECTOMY      HERNIA REPAIR  2020    HYSTERECTOMY  2001    partial    OOPHORECTOMY      still has one ovary    PR COLONOSCOPY W/BIOPSY SINGLE/MULTIPLE N/A 04/19/2020    Procedure: COLONOSCOPY, FLEXIBLE, PROXIMAL TO SPLENIC FLEXURE; WITH BIOPSY, SINGLE OR MULTIPLE;  Surgeon: Luanne Bras, MD;  Location: [facility] PROCED[facility];  Service: Gastroenterology    PR REPAIR INCISIONAL HERNIA,STRANG Midline 09/25/2018    Procedure: REPAIR INITIAL INCISIONAL OR VENTRAL HERNIA; INCARCERATED OR STRANGULATED;  Surgeon: Colon Branch, MD;  Location: [facility] OR [facility];  Service: General Surgery    PR SHLDR ARTHROSCOP,SURG,W/ROTAT CUFF REPR Left 09/25/2020    Procedure: ARTHROSCOPY, SHOULDER, SURGICAL; WITH ROTATOR CUFF REPAIR;  Surgeon: Tomasa Rand, MD;  Location[facility];  Service: Orthopedics    PR UPPER GI ENDOSCOPY,DIAGNOSIS N/A 04/19/2020    Procedure: UGI ENDO, INCLUDE ESOPHAGUS, STOMACH, & DUODENUM &/OR JEJUNUM; DX W/WO COLLECTION SPECIMN, BY BRUSH OR WASH;  Surgeon: Luanne Bras, MD;  Location: [facility];  Service: Gastroenterology    WISDOM TOOTH EXTRACTION        Family History   Problem Relation Age of Onset    Lymphoma Mother     Diabetes Mother     Hypertension Mother     Skin cancer Mother     Arthritis Mother     Cancer Mother         lymph    Kidney disease Mother     Vision loss Mother         Macular D.    Parkinsonism Father     Hypertension Father     Colon cancer Father 12    Macular degeneration Father     Alcohol abuse Father     Cancer Father         colon    Depression Father     Vision loss Father         Macular D.    Parkinsonism Sister     Tremor Sister         essential tremor, hands and voice    Seizures Sister     Migraines Sister     Macular  degeneration Sister     Miscarriages / [place] Sister     Vision loss Sister         Macular d    Skin cancer Sister     Tremor Sister         no diagnosis    Migraines Sister     Macular degeneration Sister     Vision loss Sister         Macular d    Skin cancer Maternal Grandmother     Stroke Maternal Grandmother     Alcohol abuse Paternal Grandmother     Heart disease Paternal Grandfather     Cancer Son         Spinal cancer, leaving pt disabled    Diabetes Son     Mental illness Son     Neuropathy Son     Depression Son     Drug abuse Son     Hypertension Son     Kidney disease Son     Vision loss Son         Diabetes    Mental illness Son     Intellectual Disability Son     Learning disabilities Son        Allergies:   Haldol [haloperidol lactate], Cyclosporine, Tetracycline, and Tramadol     OBJECTIVE   General  Medical Tests / Procedures: Chart review  Pain Comments: 0/10    Risk Factors  Risk Factors: None    Precautions / Restrictions  Precautions: Psych Safety precautions  Weight Bearing Status: Non-applicable  Required Braces or Orthoses: Non-applicable      OCCUPATIONAL PROFILE       Occupational Profile Summary: Laycie Schriner is a 51 y.o., 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 Pt describes worsening depression over the last year but decades of mood symptoms.  States recently she has been in therapy and on her think some childhood trauma.  Discussing childhood trauma has caused some of her family members to distance from her stating that they do not believe her.  One of her sons no longer speaks to her and states that her mental health concerns have been intact.  States that she feels hopeless.  Has had previous suicide attempts in her 48s 30s and then most recently in her 82s.  She has had suicidal planning most recently in 2021 with a plan to shoot herself.  Was admitted to crisis at the time. Previous suicide attempts have been via overdose.  Denies any current suicidal ideation outside of chronic passive SI, denies any planning behaviors at this time.  States she was able to safety plan with her outpatient provider.  Does not feel like she will hurt herself on the unit. States significant cognitive impairment.  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  Also has poor sleep and poor appetite. Note describes dizziness and falls that have worsened since pristiq althuhg has been clusmy since the surgery. Most recently fell a few weeks ago, no major injuries. Ambulates at home per Pt by hugging walls and  a hx of falls in bathroom and does not access areas that require use of stairs.    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 Ambulates at home per Pt by hugging walls    PARTICIPATION Active participation THOUGHT PROCESSES Coherent   EYE CONTACT Good HALLUCINATION  Denies   MOOD Calm DELUSIONS None noted   SPEECH  Clear SAFETY JUDGEMENT Acknowledgement of mental health diagnosis, Good awareness of safety precautions         PERFORMANCE SKILLS     MOTOR SKILLS appears to be [facility] SOCIAL INTERACTION SKILLS expressive, cooperative   PROCESS SKILLS functional choice making EMOTION REGULATION SKILLS Primary difficulties at home         PERFORMANCE PATTERNS     Habits - Useful: Roles. supports,  Habits - Impoverished: Coping, helath management  Habits - Dominating: history of Major depressive disorder, multiple suicide attempts and vestibular challenges  Routines - Satisfying: None reported  Routines - Damaging: history of Major depressive disorder, multiple suicide attempts and vestibular challenges      I attest that I have reviewed the above information.  Signed: Kingsley Callander, OT  Filed 07/20/2021

## 2021-07-20 NOTE — Unmapped (Signed)
[facility] ECT               Procedure Note                                             Requesting Attending Physician: Bryn Gulling, MD  Admit Date: 07/19/2021     Service Type: Inpatient  Requesting Attending Physician: Bryn Gulling, MD  Service requesting consult: Psychiatry  Consulting service: Psychiatry  Admit Date: 07/19/2021  Service Date: Jul 20, 2021      Time out was taken with staff to confirm correct patient and correct procedure to be performed.    INDICATION FOR ECT: Mood Disorder: Maj Depress D/O, REC  Severe WITHOUT psychotic behavior      TREATMENT HISTORY:    Total Number of Treatments: 1     Current Treatment #: 1    Treatment Type: Index    Electrode Placement: Left Frontal Right Temporal      RATING SCALES:    CGI-Change score for ECT series:   CGI-C: 4. No change    Beck Depression Inventory Total Score:   51    Mini-Mental Status ExamTotal Score:   25     Bush-Francis CatatoniaTotal Score:       MEDICAL INFORMATION:    Allergies:  Haldol [haloperidol lactate], Cyclosporine, Tetracycline, and Tramadol    Medical History:  See ECT Consult    Surgical History:  See ECT Consult    VITAL SIGNS:      Vitals:    07/19/21 1948   BP: 94/64   Pulse: 71   Resp: 18   Temp: 36.2 ??C (97.1 ??F)   SpO2: 100%     BP: 100/60  HR: 60    Post-procedure:  BP: 122/67  HR: 89      THYMATRON:  ECT # Thymatron Settings (%) Modification Seizure   Length Cuff Duration  (sec) EEG Duration  (sec) Complications Interventions   1 10  Good Inadequate 0 0 None None   2 15 Good Inadequate 0 0 None None   3 30 Good Adequate 0 32 None None                           Medications given during procedure:   Current Facility-Administered Medications   Medication Dose Route Frequency Provider Last Rate Last Admin    acetaminophen (TYLENOL) tablet 650 mg  650 mg Oral Q6H PRN Estelle Grumbles, MD   650 mg at 07/19/21 2055    aluminum-magnesium hydroxide-simethicone (MAALOX MAX) 80-80-8 mg/mL oral suspension  30 mL Oral Q6H PRN Estelle Grumbles, MD   30 mL at 07/19/21 2054    hydrOXYzine (ATARAX) tablet 25 mg  25 mg Oral Q6H PRN Estelle Grumbles, MD   25 mg at 07/19/21 2054    rifAXIMin (XIFAXAN) tablet 550 mg  550 mg Oral BID Estelle Grumbles, MD   550 mg at 07/19/21 2052    traZODone (DESYREL) tablet 50 mg  50 mg Oral Nightly PRN Cyndee Brightly, MD   50 mg at 07/19/21 2054     Facility-Administered Medications Ordered in Other Encounters   Medication Dose Route Frequency Provider Last Rate Last Admin    ondansetron (ZOFRAN) injection   Intravenous PRN (once a day) Val Eagle, MD  4 mg at 07/20/21 1111    propofoL (DIPRIVAN) injection   Intravenous PRN (once a day) Val Eagle, MD   150 mg at 07/20/21 1118    sodium chloride (NS) 0.9 % infusion   Intravenous Continuous PRN Val Eagle, MD   New Bag at 07/20/21 1111    succinylcholine (ANECTINE) injection   Intravenous PRN (once a day) Val Eagle, MD   120 mg at 07/20/21 1118        Medication during hospitalization         acetaminophen (TYLENOL) tablet 650 mg Admin Date  07/19/2021  20:55 Action  Given Dose  650 mg Route  Oral Site          aluminum-magnesium hydroxide-simethicone (MAALOX MAX) 80-80-8 mg/mL oral suspension Admin Date  07/19/2021  20:54 Action  Given Dose  30 mL Route  Oral Site          hydrOXYzine (ATARAX) tablet 25 mg Admin Date  07/19/2021  20:54 Action  Given Dose  25 mg Route  Oral Site          rifAXIMin (XIFAXAN) tablet 550 mg Admin Date  07/19/2021  20:52 Action  Given Dose  550 mg Route  Oral Site          traZODone (DESYREL) tablet 50 mg Admin Date  07/19/2021  20:54 Action  Given Dose  50 mg Route  Oral Site              Neck Collar Used:  Yes    PROGRESS NOTE:     Alexis Burns is a 56 y.o. who presents for initiation of index series of ECT as an inpatient.    Pt states she is doing ok this morning. Over the last few weeks, she has been doing very poorly - very emotional, down, low motivation, dizzy. Sleeping too much, ~14h/day, Also endorses low appetite. Endorses SI, both passive and active, but not the last week or so, and she is unsure why it went away. She has passive SI more commonly and had that more recently. She states she can talk to her doctor, therapist, pastor, and her husband if her thoughts were to get worse. Denies AVH. Discussed indications for ECT, risks, benefits, and alternative treatments. Discussed starting with Left Frontal Right Temporal (LFRT) electrode placement. All questions were answered and patient consented for ECT.      Mental Status Exam:  Appearance:    Well nourished and Well developed   Behavior:  Calm and Cooperative   Motor:   No abnormal movements   Speech/Language:    Normal rate, volume, tone, fluency and Language intact, well formed   Mood:   Depressed   Affect:   Constricted, Depressed, and Mood congruent   Thought process:   Logical, linear, clear, coherent, goal directed   Thought content:      Endorses chronic, passive suicidal ideation but no suicidal ideation today   Perceptual disturbances:     Denies auditory and visual hallucinations, behavior not concerning for response to internal stimuli     Orientation:   Oriented to person, place, time, and general circumstances   Attention:   Able to fully attend without fluctuations in consciousness   Concentration:   Able to fully concentrate and attend   Memory:    MMSE 25/30    Fund of knowledge:    Consistent with level of education and development   Insight:     Fair   Judgment:  Fair   Impulse Control:   Fair     Physical Exam:   Gen: NAD  Pulm: No increased work of breathing  Neuro: No tremors, tics noted, no abnormal movements    A/P: Alexis Burns is a 56 y.o. who presents for initiation of index series of ECT as an inpatient for treatment of major depressive disorder, recurrent, severe, without psychotic features. Baseline BDI of 51 and MMSE of 25. Started index ECT today with LFRT electrode placement. At 30% energy, seizure was of adequate length, poor waveform and amplitude, poor post-ictal suppression.    Post-Treatment Complications: None    Recommendations for Future Treatments:  Increase energy to 60% (treating at twice seizure threshold)    Next Treatment Date: 07/23/21    Meds to take prior to ECT: None    Special Instructions: Do not take these medications after 6pm on the night before ECT: Klonopin/clonazepam (prescribed as an outpatient but not currently prescribed inpatient)    Assisted By: Thornell Sartorius, MD    Matthew Folks, MD
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
## 2021-07-21 ENCOUNTER — Ambulatory Visit: Admit: 2021-07-21 | Discharge: 2021-07-21 | Payer: PRIVATE HEALTH INSURANCE

## 2021-07-21 LAB — BASIC METABOLIC PANEL
ANION GAP: <1 mmol/L — ABNORMAL LOW (ref 5–14)
BLOOD UREA NITROGEN: 10 mg/dL (ref 9–23)
BUN / CREAT RATIO: 12
CALCIUM: 9 mg/dL (ref 8.7–10.4)
CHLORIDE: 114 mmol/L — ABNORMAL HIGH (ref 98–107)
CO2: 31 mmol/L (ref 20.0–31.0)
CREATININE: 0.85 mg/dL — ABNORMAL HIGH
EGFR CKD-EPI (2021) FEMALE: 81 mL/min/{1.73_m2} (ref >=60)
GLUCOSE RANDOM: 83 mg/dL (ref 70–179)
POTASSIUM: 4.6 mmol/L (ref 3.4–4.8)
SODIUM: 144 mmol/L (ref 135–145)

## 2021-07-21 MED ADMIN — rifAXIMin (XIFAXAN) tablet 550 mg: 550 mg | ORAL | @ 13:00:00 | Stop: 2021-08-18

## 2021-07-21 MED ADMIN — aluminum-magnesium hydroxide-simethicone (MAALOX MAX) 80-80-8 mg/mL oral suspension: 30 mL | ORAL | @ 01:00:00

## 2021-07-21 MED ADMIN — venlafaxine (EFFEXOR-XR) 24 hr capsule 75 mg: 75 mg | ORAL | @ 13:00:00

## 2021-07-21 MED ADMIN — acetaminophen (TYLENOL) tablet 650 mg: 650 mg | ORAL | @ 13:00:00

## 2021-07-21 MED ADMIN — acetaminophen (TYLENOL) tablet 650 mg: 650 mg | ORAL | @ 01:00:00

## 2021-07-21 MED ADMIN — traZODone (DESYREL) tablet 50 mg: 50 mg | ORAL | @ 01:00:00

## 2021-07-21 MED ADMIN — hydrOXYzine (ATARAX) tablet 25 mg: 25 mg | ORAL | @ 01:00:00

## 2021-07-21 MED ADMIN — rifAXIMin (XIFAXAN) tablet 550 mg: 550 mg | ORAL | @ 01:00:00 | Stop: 2021-08-18

## 2021-07-21 NOTE — Unmapped (Signed)
The patient was able to take her medications this shift. She requested for pain medications for headache and back pain. She has been using a front wheeled walker appropriately and walking with a more steady gait. The patient denied SI/HI and no AVH. A/O X4 with no respiratory distress observed. She sat and watched TV in the day room with her peers after lunch. Staff will continue to monitor the patient.  Problem: Adult Behavioral Health Plan of Care  Goal: Plan of Care Review  Outcome: Progressing  Flowsheets (Taken 07/21/2021 1555)  Plan of Care Reviewed With: patient  Patient Agreement with Plan of Care: agrees  Goal: Patient-Specific Goal (Individualization)  Outcome: Progressing  Flowsheets (Taken 07/21/2021 1555)  Patient-Specific Goals (Include Timeframe): No goal stated this shift.  Patient Personal Strengths:   medication/treatment adherence   expressive of emotions   expressive of needs  Goal: Adheres to Safety Considerations for Self and Others  Outcome: Progressing  Goal: Absence of New-Onset Illness or Injury  Outcome: Progressing  Goal: Optimized Coping Skills in Response to Life Stressors  Outcome: Progressing  Goal: Develops/Participates in Therapeutic Alliance to Support Successful Transition  Outcome: Progressing  Goal: Rounds/Family Conference  Outcome: Progressing  Flowsheets (Taken 07/21/2021 1555)  Participants:   Milieu/Psych techs   nursing   patient   pharmacy   dietitian/nutrition services   psychiatrist     Problem: Impaired Wound Healing  Goal: Optimal Wound Healing  Outcome: Progressing     Problem: Fall Injury Risk  Goal: Absence of Fall and Fall-Related Injury  Outcome: Progressing  Intervention: Promote Injury-Free Environment  Recent Flowsheet Documentation  Taken 07/21/2021 1610 by Lorayne Bender, RN  Safety Interventions:   environmental modification   low bed   nonskid shoes/slippers when out of bed     Problem: Suicide Risk  Goal: Absence of Self-Harm  Outcome: Progressing  Intervention: Assess Risk to Self and Maintain Safety  Recent Flowsheet Documentation  Taken 07/21/2021 0937 by Lorayne Bender, RN  Behavior Management:   boundaries reinforced   impulse control promoted

## 2021-07-21 NOTE — Unmapped (Signed)
Psychiatry      Daily Progress Note      Admit date/time: 07/19/2021  5:08 PM   LOS: 2 days      Assessment:  Alexis Burns is a 56 y.o., White race, Not Hispanic, Latino/a, or Spanish origin ethnicity,  ENGLISH speaking female with a history of Major depressive disorder, multiple suicide attempts, who presents for evaluation of Severe depression, plan for ECT.     Alexis Burns is an [facility]TC patient since 2019 with a history of depression and anxiety, with past hospitalizations in 2017 at [facility] and admission to crisis unit in 2021 for suicidal ideation. Patient presents as a direct admission from clinic after discussion about initiation of index series of ECT.  She describes lifelong mood symptoms that have acutely worsened in the last year in the setting of worsening health.  Follows with ADT clinic.  Patient has had significant depressed mood, cognitive delays, tearfulness, decreased appetite, poor sleep, passive suicidal ideation which has been managed in the outpatient setting with extensive safety planning.  Patient has noted that these symptoms have lasted for 8 months, however, she does not recall the last time that she was happy.  Patient's symptoms consistent with major depressive disorder vs persistent depressive disorder.  Will further clarify upon collateral from husband.      Patient has long history of mood symptoms dating back to her 46s, she has been established w/ [facility]TC since 2019. September 2021 she was admitted to the [facility] for suicidal ideation with a plan to shoot herself. Per outpatient documentation: She has had multiple previous med trials including: past medication trials include Prozac (stopped working), risperidone (brief trial), lithium (intolerable side effects), buspirone (unhelpful at 20mg  TID), duloxetine (90mg  well-tolerated but not helpful on its own), aripiprazole (as adjunct for duloxetine, eventually stopped due to tremors), Wellbutrin (anxiety, dry mouth), Seroquel (ineffective), Cytomel (dry mouth, joint pain).  Most recently, she is taking Pristiq 100 mg and Klonopin PRN for anxiety.  She was placed on inpatient admission list for ECT and called this evening for admission. She is on the ECT schedule for 1:30 PM on 5/19.     As of 07/21/2021, continued hospitalization is warranted given severe major depressive symptom burden, suicidal ideation, continued treatment with ECT.      Weekend updates:   5/20: Presentation overall stable. Endorses mild headache/jaw soreness from ECT but otherwise tolerated well. No apparent SE from Effexor, though patient requests to switch to Prozac given previous efficacy. Deferred changes to antidepressant to primary team. Will repeat BMP given mild hypernatremia on 5/19 labs. TSH and B12 wnl.     Diagnoses:   Active Problems:    Severe episode of recurrent major depressive disorder, 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)    Plan:  Safety:  -- Continue admission to inpatient psychiatric unit for safety, stabilization, and treatment.   -- Admission status: Involuntary  -- On unit/off unit level of supervision: q15 min checks / 1:1 (staff:patient)  -- Medical equipment: The treatment team has determined that this patient requires the continued use of medical equipment  walker for their care and healing. 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   -- Primary team to follow up with family, outpatient resources    Patient was seen and plan of care was discussed with the Attending MD, Dr. Haskel Khan, who agrees with the above statement and plan.     March Rummage, MD    Subjective:    Hours of sleep overnight :   9 hrs.     Weekend rounding    Per 07/20/21 RN Epic note:   The patient went to ECT this morning at 9:17 am and returned to the unit after mid day. She appeared unsteady on her feet and the MD ordered the patient to be on falls precaution. Her morning medication was administered after she returned from ECT. The patient denied SI/HI and no AVH noted. She has been resting in bed post ECT. Staff will continue to monitor the patient.    MD interview:   Pt endorses dizziness this morning. Says this has been going on for over a year and getting worse after the last few months. Thinks it is related to liver disease. Dizziness is not worse this morning after starting Effexor. Pt is upset to hear that she is on Effexor. Says years ago Effexor made her see people that weren't there. MD provided psychoeducation on Effexor vs Pristiq and discussed these are similar compounds. Pt says Prozac is the only thing that has worked for her in the past. She has been on Prozac multiple times but always loses efficacy. Says that Prozac typically works until my next tragedy. She has been up to 100 mg in the past. Usually works for about 6 months before it stops working. Pt denies benefit from other SSRIs/SNRIs. Pt describes having a lot of inner turmoil. She endorses hopelessness, hypersomnia, anhedonia, suicidal ideation. Says a couple weeks ago for the first time had thoughts of cutting herself. She denies thoughts of acting on SI in the hospital. She is feeling safe here. Pt has not felt well since her kids were teenagers. Pt endorses mild headache/jaw pain this morning from ECT.    Objective:    Vitals:  Patient Vitals for the past 12 hrs:   BP Temp Pulse Resp SpO2   07/21/21 0728 95/71 36 ??C (96.8 ??F) 79 18 100 %       Mental Status Exam:    Appearance:    Appears stated age, Well nourished, Well developed and Clean/Neat   Motor:   No abnormal movements   Speech/Language:    Normal rate, volume, tone, fluency   Mood:   depressed  Affect:   Dysthymic, Mood congruent and Mildly anxious   Thought process:   Logical, linear, clear, coherent, goal directed   Thought content:     Endorses SI without intent/plan   Perceptual disturbances:     Denies auditory and visual hallucinations, behavior not concerning for response to internal stimuli     Insight:     Fair   Judgment:    Fair   Impulse Control:   Fair   Other:         PE:   Gen: No acute distress, well developed and well nourished  Resp: Normal work of breathing  Neuro: no tics/tremors    Vital Signs:   Vitals:    07/21/21 0728   BP: 95/71   Pulse: 79   Resp: 18   Temp: 36 ??C (96.8 ??F)   SpO2: 100%       Test Results:  Data Review:   Lab results last 24 hours:    Recent Results (from the past 24 hour(s))   Basic Metabolic Panel    Collection Time: 07/20/21  1:39 PM   Result Value Ref Range    Sodium 147 (H) 135 - 145 mmol/L    Potassium 3.9 3.4 - 4.8 mmol/L    Chloride 112 (H) 98 - 107 mmol/L    CO2 29.0 20.0 - 31.0 mmol/L    Anion Gap 6 5 - 14 mmol/L    BUN 13 9 - 23 mg/dL    Creatinine 2.95 6.21 - 0.80 mg/dL    BUN/Creatinine Ratio 19     eGFR CKD-EPI (2021) Female >90 >=60 mL/min/1.24m2    Glucose 97 70 - 179 mg/dL    Calcium 9.2 8.7 - 30.8 mg/dL   TSH    Collection Time: 07/20/21  1:39 PM   Result Value Ref Range    TSH 1.136 0.550 - 4.780 uIU/mL   Vitamin B12 Level Collection Time: 07/20/21  1:39 PM   Result Value Ref Range    Vitamin B-12 474 211 - 911 pg/ml   CBC w/ Differential    Collection Time: 07/20/21  1:39 PM   Result Value Ref Range    WBC 3.9 3.6 - 11.2 10*9/L    RBC 4.53 3.95 - 5.13 10*12/L    HGB 13.4 11.3 - 14.9 g/dL    HCT 65.7 84.6 - 96.2 %    MCV 87.7 77.6 - 95.7 fL    MCH 29.6 25.9 - 32.4 pg    MCHC 33.7 32.0 - 36.0 g/dL    RDW 95.2 84.1 - 32.4 %    MPV 8.4 6.8 - 10.7 fL    Platelet 74 (L) 150 - 450 10*9/L    Neutrophils % 50.2 %    Lymphocytes % 38.8 %    Monocytes % 6.6 %    Eosinophils % 3.6 %    Basophils % 0.8 %    Absolute Neutrophils 2.0 1.8 - 7.8 10*9/L    Absolute Lymphocytes 1.5 1.1 - 3.6 10*9/L    Absolute Monocytes 0.3 0.3 - 0.8 10*9/L    Absolute Eosinophils 0.1 0.0 - 0.5 10*9/L    Absolute Basophils 0.0 0.0 - 0.1 10*9/L     Imaging: None    Psychometrics:  PHQ-9 PHQ-9 TOTAL SCORE   07/19/2021   7:23 PM 26   06/18/2021   5:00 PM 24   06/05/2021   9:00 AM 26   11/11/2019   8:00 PM 13   11/05/2019   6:00 AM  21         Social Determinants of Health with Concerns     Internet Connectivity: Not on file   Tobacco Use: Medium Risk   ??? Smoking Tobacco Use: Former   ??? Smokeless Tobacco Use: Never   ??? Passive Exposure: Not on file   Substance Use: Not on file   Physical Activity: Not on file   Interpersonal Safety: Not on file   Stress: Not on file   Intimate Partner Violence: Not on file   Social Connections: Not on file        ---  Time-based billing disclaimer:  I personally spent 20   minutes face-to-face and non-face-to-face in the care of this patient, which includes all pre, intra, and post visit time on the date of service. ??All documented time was specific to the E/M visit and does not include any procedures that may have been performed.

## 2021-07-21 NOTE — Unmapped (Signed)
Patient was resting in her room at beginning of shift. Patient is alert, oriented x 4, with flat affect , feeling tired and sleepy after the ECT today. Pt denies SI /HI and AVH. Took  scheduled medication and prn Trazodone, Atarax, Maalox and tylenol were given with good result. No unsafe behaviors observed.  Care plan reviewed and 1:1 therapeutic intervention completed.

## 2021-07-22 MED ADMIN — traZODone (DESYREL) tablet 50 mg: 50 mg | ORAL

## 2021-07-22 MED ADMIN — acetaminophen (TYLENOL) tablet 650 mg: 650 mg | ORAL | @ 12:00:00

## 2021-07-22 MED ADMIN — hydrOXYzine (ATARAX) tablet 25 mg: 25 mg | ORAL | @ 18:00:00

## 2021-07-22 MED ADMIN — hydrOXYzine (ATARAX) tablet 25 mg: 25 mg | ORAL

## 2021-07-22 MED ADMIN — acetaminophen (TYLENOL) tablet 650 mg: 650 mg | ORAL | @ 01:00:00

## 2021-07-22 MED ADMIN — lidocaine (LIDODERM) 5 % patch 2 patch: 2 | TRANSDERMAL | @ 01:00:00

## 2021-07-22 MED ADMIN — rifAXIMin (XIFAXAN) tablet 550 mg: 550 mg | ORAL | Stop: 2021-08-18

## 2021-07-22 MED ADMIN — rifAXIMin (XIFAXAN) tablet 550 mg: 550 mg | ORAL | @ 12:00:00 | Stop: 2021-08-18

## 2021-07-22 MED ADMIN — acetaminophen (TYLENOL) tablet 650 mg: 650 mg | ORAL | @ 23:00:00

## 2021-07-22 MED ADMIN — venlafaxine (EFFEXOR-XR) 24 hr capsule 75 mg: 75 mg | ORAL | @ 12:00:00

## 2021-07-22 NOTE — Unmapped (Signed)
Psychiatry      Daily Progress Note      Admit date/time: 07/19/2021  5:08 PM   LOS: 3 days      Assessment:  Alexis Burns is a 56 y.o., White race, Not Hispanic, Latino/a, or Spanish origin ethnicity,  ENGLISH speaking female with a history of Major depressive disorder, multiple suicide attempts, who presents for evaluation of Severe depression, plan for ECT.     [name] is an [facility] patient since 2019 with a history of depression and anxiety, with past hospitalizations in 2017 at [facility] and admission to crisis unit in 2021 for suicidal ideation. Patient presents as a direct admission from clinic after discussion about initiation of index series of ECT.  She describes lifelong mood symptoms that have acutely worsened in the last year in the setting of worsening health.  Follows with ADT clinic.  Patient has had significant depressed mood, cognitive delays, tearfulness, decreased appetite, poor sleep, passive suicidal ideation which has been managed in the outpatient setting with extensive safety planning.  Patient has noted that these symptoms have lasted for 8 months, however, she does not recall the last time that she was happy.  Patient's symptoms consistent with major depressive disorder vs persistent depressive disorder.  Will further clarify upon collateral from husband.      Patient has long history of mood symptoms dating back to her 49s, she has been established w/ [facility] since 2019. September 2021 she was admitted to the [facility] for suicidal ideation with a plan to shoot herself. Per outpatient documentation: She has had multiple previous med trials including: past medication trials include Prozac (stopped working), risperidone (brief trial), lithium (intolerable side effects), buspirone (unhelpful at 20mg  TID), duloxetine (90mg  well-tolerated but not helpful on its own), aripiprazole (as adjunct for duloxetine, eventually stopped due to tremors), Wellbutrin (anxiety, dry mouth), Seroquel (ineffective), Cytomel (dry mouth, joint pain).  Most recently, she is taking Pristiq 100 mg and Klonopin PRN for anxiety.  She was placed on inpatient admission list for ECT and called this evening for admission. She is on the ECT schedule for 1:30 PM on 5/19.     As of 07/22/2021, continued hospitalization is warranted given severe major depressive symptom burden, suicidal ideation, continued treatment with ECT.      Weekend updates:   5/20: Presentation overall stable. Endorses mild headache/jaw soreness from ECT but otherwise tolerated well. No apparent SE from Effexor, though patient requests to switch to Prozac given previous efficacy. Deferred changes to antidepressant to primary team. Will repeat BMP given mild hypernatremia on 5/19 labs. TSH and B12 wnl.   5/21: Stable depressive symptoms. BPs soft yesterday and today, unclear etiology. Ordered orthostatic VS BID. Also decreased trazodone to 25 mg at bedtime given potential to cause hypotension. Recommend primary team CTM and consider medicine c/s if persistent.     Diagnoses:   Active Problems:    Severe episode of recurrent major depressive disorder, 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)    Plan:  Safety:  -- Continue admission to inpatient psychiatric unit for safety, stabilization, and treatment.   -- Admission status: Voluntary  -- On unit/off unit level of supervision: q15 min checks / 1:1 (staff:patient)  -- Medical equipment: The treatment team has determined that this patient requires the continued use of medical equipment  walker for their care and healing. 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   -- Primary team to follow up with family, outpatient resources    Patient was seen and plan of care was discussed with the Attending MD, Dr. Haskel Khan, who agrees with the above statement and plan.     March Rummage, MD    Subjective:    Hours of sleep overnight :   9 hrs.     Weekend rounding    Per 07/21/21 RN Epic note:   The patient was able to take her medications this shift. She requested for pain medications for headache and back pain. She has been using a front wheeled walker appropriately and walking with a more steady gait. The patient denied SI/HI and no AVH. A/O X4 with no respiratory distress observed. She sat and watched TV in the day room with her peers after lunch. Staff will continue to monitor the patient.    MD interview:   Pt reports she is sleepy. Reports she slept every second of the day yesterday except for when she was eating. She is not sure why she is feeling so tired. She thinks a lot of it has to do with her depression. Describes her mood as still down. Her only concern today is the Effexor. Says she is watching for side effects and has not noticed any so far.     Discussed her blood pressures. Pt reports drinking a lot of fluids. She is feeling more dizzy than usual, especially when she wakes up in the morning. Takes trazodone occasionally at home. Discussed possibility that trazodone contributing to Bps. Pt is hesitant to hold trazodone d/t concerns she won't be able to sleep. She agrees w/trying 25 mg dose tonight.     Objective:    Vitals:  Patient Vitals for the past 12 hrs:   BP Temp Pulse Resp SpO2   07/22/21 0937 92/63 -- 98 18 --   07/22/21 0934 102/73 -- 83 18 --   07/22/21 0931 88/50 -- 72 18 --   07/22/21 0756 86/42 36.5 ??C (97.7 ??F) 73 20 98 %       Mental Status Exam:    Appearance:    Appears older than stated age, ambulating with walker   Motor:   No abnormal movements   Speech/Language:    Normal rate, volume, tone, fluency   Mood:  still down   Affect:   Anxious, Dysthymic and Mood congruent   Thought process:   Logical, linear, clear, coherent, goal directed   Thought content:     Denies active SI   Perceptual disturbances:     Denies auditory and visual hallucinations, behavior not concerning for response to internal stimuli     Insight:     Fair   Judgment:    Fair   Impulse Control:   Fair   Other:         PE:   Gen: No acute distress, well developed and well nourished  Resp: Normal work of breathing  Neuro: no tics/tremors    Vital Signs:   Vitals:    07/22/21 0937   BP: 92/63   Pulse: 98   Resp: 18   Temp:    SpO2:        Test Results:  Data Review:   Lab results last 24 hours:    Recent Results (from the past 24 hour(s))   Basic Metabolic Panel    Collection Time: 07/21/21 12:13 PM   Result Value Ref Range    Sodium 144 135 - 145 mmol/L    Potassium 4.6 3.4 - 4.8 mmol/L    Chloride 114 (H) 98 - 107 mmol/L    CO2 31.0 20.0 - 31.0 mmol/L    Anion Gap <1 (L) 5 - 14 mmol/L    BUN 10 9 - 23 mg/dL    Creatinine 4.54 (H) 0.60 - 0.80 mg/dL    BUN/Creatinine Ratio 12     eGFR CKD-EPI (2021) Female 81 >=60 mL/min/1.59m2    Glucose 83 70 - 179 mg/dL    Calcium 9.0 8.7 - 09.8 mg/dL     Imaging: None    Psychometrics:  PHQ-9 PHQ-9 TOTAL SCORE   07/19/2021   7:23 PM 26   06/18/2021   5:00 PM 24   06/05/2021   9:00 AM 26   11/11/2019   8:00 PM 13   11/05/2019   6:00 AM 21         Social Determinants of Health with Concerns     Internet Connectivity: Not on file   Tobacco Use: Medium Risk   ??? Smoking Tobacco Use: Former   ??? Smokeless Tobacco Use: Never   ??? Passive Exposure: Not on file   Substance Use: Not on file   Physical Activity: Not on file   Interpersonal Safety: Not on file   Stress: Not on file   Intimate Partner Violence: Not on file   Social Connections: Not on file        ---  Time-based billing disclaimer:  I personally spent 20   minutes face-to-face and non-face-to-face in the care of this patient, which includes all pre, intra, and post visit time on the date of service. ??All documented time was specific to the E/M visit and does not include any procedures that may have been performed.

## 2021-07-23 LAB — CBC W/ AUTO DIFF
BASOPHILS ABSOLUTE COUNT: 0 10*9/L (ref 0.0–0.1)
BASOPHILS RELATIVE PERCENT: 0.9 %
EOSINOPHILS ABSOLUTE COUNT: 0.1 10*9/L (ref 0.0–0.5)
EOSINOPHILS RELATIVE PERCENT: 3 %
HEMATOCRIT: 38.8 % (ref 34.0–44.0)
HEMOGLOBIN: 13.1 g/dL (ref 11.3–14.9)
LYMPHOCYTES ABSOLUTE COUNT: 1.3 10*9/L (ref 1.1–3.6)
LYMPHOCYTES RELATIVE PERCENT: 33 %
MEAN CORPUSCULAR HEMOGLOBIN CONC: 33.8 g/dL (ref 32.0–36.0)
MEAN CORPUSCULAR HEMOGLOBIN: 29.9 pg (ref 25.9–32.4)
MEAN CORPUSCULAR VOLUME: 88.5 fL (ref 77.6–95.7)
MEAN PLATELET VOLUME: 8 fL (ref 6.8–10.7)
MONOCYTES ABSOLUTE COUNT: 0.3 10*9/L (ref 0.3–0.8)
MONOCYTES RELATIVE PERCENT: 7.5 %
NEUTROPHILS ABSOLUTE COUNT: 2.1 10*9/L (ref 1.8–7.8)
NEUTROPHILS RELATIVE PERCENT: 55.6 %
PLATELET COUNT: 69 10*9/L — ABNORMAL LOW (ref 150–450)
RED BLOOD CELL COUNT: 4.38 10*12/L (ref 3.95–5.13)
RED CELL DISTRIBUTION WIDTH: 13.6 % (ref 12.2–15.2)
WBC ADJUSTED: 3.8 10*9/L (ref 3.6–11.2)

## 2021-07-23 LAB — BASIC METABOLIC PANEL
ANION GAP: 5 mmol/L (ref 5–14)
BLOOD UREA NITROGEN: 10 mg/dL (ref 9–23)
BUN / CREAT RATIO: 14
CALCIUM: 9 mg/dL (ref 8.7–10.4)
CHLORIDE: 113 mmol/L — ABNORMAL HIGH (ref 98–107)
CO2: 28 mmol/L (ref 20.0–31.0)
CREATININE: 0.7 mg/dL
EGFR CKD-EPI (2021) FEMALE: >90 mL/min/{1.73_m2} (ref >=60)
GLUCOSE RANDOM: 82 mg/dL (ref 70–179)
POTASSIUM: 4.2 mmol/L (ref 3.4–4.8)
SODIUM: 146 mmol/L — ABNORMAL HIGH (ref 135–145)

## 2021-07-23 MED ADMIN — propofoL (DIPRIVAN) injection: INTRAVENOUS | @ 13:00:00 | Stop: 2021-07-23

## 2021-07-23 MED ADMIN — hydrOXYzine (ATARAX) tablet 25 mg: 25 mg | ORAL | @ 01:00:00

## 2021-07-23 MED ADMIN — succinylcholine (ANECTINE) injection: INTRAVENOUS | @ 13:00:00 | Stop: 2021-07-23

## 2021-07-23 MED ADMIN — venlafaxine (EFFEXOR-XR) 24 hr capsule 75 mg: 75 mg | ORAL | @ 14:00:00 | Stop: 2021-07-23

## 2021-07-23 MED ADMIN — aluminum-magnesium hydroxide-simethicone (MAALOX MAX) 80-80-8 mg/mL oral suspension: 30 mL | ORAL | @ 02:00:00

## 2021-07-23 MED ADMIN — rifAXIMin (XIFAXAN) tablet 550 mg: 550 mg | ORAL | @ 01:00:00 | Stop: 2021-08-18

## 2021-07-23 MED ADMIN — sodium chloride (NS) 0.9 % infusion: INTRAVENOUS | @ 13:00:00 | Stop: 2021-07-23

## 2021-07-23 MED ADMIN — acetaminophen (TYLENOL) tablet 650 mg: 650 mg | ORAL | @ 14:00:00

## 2021-07-23 MED ADMIN — hydrOXYzine (ATARAX) tablet 25 mg: 25 mg | ORAL | @ 17:00:00

## 2021-07-23 MED ADMIN — lidocaine (LIDODERM) 5 % patch 2 patch: 2 | TRANSDERMAL | @ 01:00:00

## 2021-07-23 MED ADMIN — rifAXIMin (XIFAXAN) tablet 550 mg: 550 mg | ORAL | @ 14:00:00 | Stop: 2021-08-18

## 2021-07-23 MED ADMIN — traZODone (DESYREL) tablet 25 mg: 25 mg | ORAL | @ 02:00:00

## 2021-07-23 NOTE — Unmapped (Signed)
[facility] ECT               Procedure Note                                             Requesting Attending Physician: Bryn Gulling, MD  Admit Date: 07/19/2021     Service Type: Inpatient  Requesting Attending Physician: Bryn Gulling, MD  Service requesting consult: Psychiatry  Consulting service: Psychiatry  Admit Date: 07/19/2021  Service Date: Jul 23, 2021      Time out was taken with staff to confirm correct patient and correct procedure to be performed.    INDICATION FOR ECT: Mood Disorder: Maj Depress D/O, REC  Severe WITHOUT psychotic behavior      TREATMENT HISTORY:    Total Number of Treatments: 2    Current Treatment #: 2    Treatment Type: Index    Electrode Placement: Left Frontal Right Temporal      RATING SCALES:    CGI-Change score for ECT series:   CGI-C: 4. No change    Beck Depression Inventory Total Score:   51    Mini-Mental Status ExamTotal Score:   25     Bush-Francis CatatoniaTotal Score:       MEDICAL INFORMATION:    Allergies:  Haldol [haloperidol lactate], Cyclosporine, Tetracycline, and Tramadol    Medical History:  See ECT Consult    Surgical History:  See ECT Consult    VITAL SIGNS:    117/74  78    Post-procedure:  178/111  105      THYMATRON:  ECT # Thymatron Settings (%) Modification Seizure   Length Cuff Duration  (sec) EEG Duration  (sec) Complications Interventions   1 55 Good Inadequate 1 19 None None   2 70 Good  adequate 0 27                                       Medications given during procedure:   Current Facility-Administered Medications   Medication Dose Route Frequency Provider Last Rate Last Admin    acetaminophen (TYLENOL) tablet 650 mg  650 mg Oral Q6H PRN Estelle Grumbles, MD   650 mg at 07/22/21 1851    aluminum-magnesium hydroxide-simethicone (MAALOX MAX) 80-80-8 mg/mL oral suspension  30 mL Oral Q6H PRN Estelle Grumbles, MD   30 mL at 07/22/21 2219    hydrOXYzine (ATARAX) tablet 25 mg  25 mg Oral Q6H PRN Estelle Grumbles, MD 25 mg at 07/22/21 2033    lactulose (CEPHULAC) packet 20 g  20 g Oral Daily PRN Maryjo Rochester, MD        lidocaine (LIDODERM) 5 % patch 2 patch  2 patch Transdermal Daily Azzie Roup, MD   2 patch at 07/22/21 2033    rifAXIMin (XIFAXAN) tablet 550 mg  550 mg Oral BID Estelle Grumbles, MD   550 mg at 07/22/21 2033    traZODone (DESYREL) tablet 25 mg  25 mg Oral Nightly PRN Marthann Schiller, MD   25 mg at 07/22/21 2218    venlafaxine (EFFEXOR-XR) 24 hr capsule 75 mg  75 mg Oral Daily Maryjo Rochester, MD   75 mg at 07/22/21 8119        Medication during  hospitalization         acetaminophen (TYLENOL) tablet 650 mg Admin Date  07/19/2021  20:55 Action  Given Dose  650 mg Route  Oral Site          acetaminophen (TYLENOL) tablet 650 mg Admin Date  07/20/2021  11:55 Action  Given Dose  650 mg Route  Oral Site          acetaminophen (TYLENOL) tablet 650 mg Admin Date  07/20/2021  21:08 Action  Given Dose  650 mg Route  Oral Site          acetaminophen (TYLENOL) tablet 650 mg Admin Date  07/21/2021  09:23 Action  Given Dose  650 mg Route  Oral Site          acetaminophen (TYLENOL) tablet 650 mg Admin Date  07/21/2021  20:53 Action  Given Dose  650 mg Route  Oral Site          acetaminophen (TYLENOL) tablet 650 mg Admin Date  07/22/2021  08:18 Action  Given Dose  650 mg Route  Oral Site          acetaminophen (TYLENOL) tablet 650 mg Admin Date  07/22/2021  18:51 Action  Given Dose  650 mg Route  Oral Site          aluminum-magnesium hydroxide-simethicone (MAALOX MAX) 80-80-8 mg/mL oral suspension Admin Date  07/19/2021  20:54 Action  Given Dose  30 mL Route  Oral Site          aluminum-magnesium hydroxide-simethicone (MAALOX MAX) 80-80-8 mg/mL oral suspension Admin Date  07/20/2021  21:08 Action  Given Dose  30 mL Route  Oral Site          aluminum-magnesium hydroxide-simethicone (MAALOX MAX) 80-80-8 mg/mL oral suspension Admin Date  07/22/2021  22:19 Action  Given Dose  30 mL Route  Oral Site      Comments: heartburn hydrOXYzine (ATARAX) tablet 25 mg Admin Date  07/19/2021  20:54 Action  Given Dose  25 mg Route  Oral Site          hydrOXYzine (ATARAX) tablet 25 mg Admin Date  07/20/2021  13:56 Action  Given Dose  25 mg Route  Oral Site          hydrOXYzine (ATARAX) tablet 25 mg Admin Date  07/20/2021  21:08 Action  Given Dose  25 mg Route  Oral Site          hydrOXYzine (ATARAX) tablet 25 mg Admin Date  07/21/2021  20:27 Action  Given Dose  25 mg Route  Oral Site      Comments: anxiety        hydrOXYzine (ATARAX) tablet 25 mg Admin Date  07/22/2021  14:18 Action  Given Dose  25 mg Route  Oral Site          hydrOXYzine (ATARAX) tablet 25 mg Admin Date  07/22/2021  20:33 Action  Given Dose  25 mg Route  Oral Site      Comments: anxiety        lidocaine (LIDODERM) 5 % patch 2 patch Admin Date  07/21/2021  20:53 Action  Patch Applied Dose  2 patch Route  Transdermal Site  Other    Comments: L shoulder, bilat lower back        lidocaine (LIDODERM) 5 % patch 2 patch Admin Date  07/22/2021  20:33 Action  Patch Applied Dose  2 patch Route  Transdermal Site  Other    Comments: shoulder back  rifAXIMin (XIFAXAN) tablet 550 mg Admin Date  07/19/2021  20:52 Action  Given Dose  550 mg Route  Oral Site          rifAXIMin (XIFAXAN) tablet 550 mg Admin Date  07/20/2021  12:55 Action  Given Dose  550 mg Route  Oral Site      Comments: Patient was in ECT        rifAXIMin (XIFAXAN) tablet 550 mg Admin Date  07/20/2021  21:07 Action  Given Dose  550 mg Route  Oral Site          rifAXIMin (XIFAXAN) tablet 550 mg Admin Date  07/21/2021  09:22 Action  Given Dose  550 mg Route  Oral Site          rifAXIMin (XIFAXAN) tablet 550 mg Admin Date  07/21/2021  20:27 Action  Given Dose  550 mg Route  Oral Site          rifAXIMin (XIFAXAN) tablet 550 mg Admin Date  07/22/2021  08:21 Action  Given Dose  550 mg Route  Oral Site          rifAXIMin (XIFAXAN) tablet 550 mg Admin Date  07/22/2021  20:33 Action  Given Dose  550 mg Route  Oral Site traZODone (DESYREL) tablet 25 mg Admin Date  07/22/2021  22:18 Action  Given Dose  25 mg Route  Oral Site      Comments: sleep        traZODone (DESYREL) tablet 50 mg Admin Date  07/19/2021  20:54 Action  Given Dose  50 mg Route  Oral Site          traZODone (DESYREL) tablet 50 mg Admin Date  07/20/2021  21:08 Action  Given Dose  50 mg Route  Oral Site          traZODone (DESYREL) tablet 50 mg Admin Date  07/21/2021  20:28 Action  Given Dose  50 mg Route  Oral Site      Comments: sleep        venlafaxine (EFFEXOR-XR) 24 hr capsule 75 mg Admin Date  07/20/2021  16:49 Action  Given Dose  75 mg Route  Oral Site      Comments: Medication not available at scheduled time.        venlafaxine (EFFEXOR-XR) 24 hr capsule 75 mg Admin Date  07/21/2021  09:21 Action  Given Dose  75 mg Route  Oral Site      Comments: medication was not available.        venlafaxine (EFFEXOR-XR) 24 hr capsule 75 mg Admin Date  07/22/2021  08:21 Action  Given Dose  75 mg Route  Oral Site              Neck Collar Used:  Yes    PROGRESS NOTE:     Alexis Burns is a 56 y.o. who presents for initiation of index series of ECT as an inpatient.    She states she is doing about the same - she feels sad, emotional, down in the dumps. Mood today is about the same. She has not noticed much changes since her first ECT treatment on Friday. She had headache and jaw soreness for 24h after ECT. She is also having shoulder pain post-ECT, and she's getting lidocaine patches for it, which doesn't really help. She denies memory issues. She is sleeping more than prior to admission because there's nothing interesting for her to do on the unit. Appetite has increased since Friday due  to not taking Ozempic. She endorses intermittent passive SI w/o plan or intent, most recently yesterday afternoon. She doesn't have it now because she is not thinking about it. She denies other question/concerns    Mental Status Exam:  Appearance:    Well nourished and Well developed   Behavior:  Calm and Cooperative   Motor:   No abnormal movements   Speech/Language:    Normal rate, volume, tone, fluency and Language intact, well formed   Mood:   About the same   Affect:   Constricted, Depressed, and Mood congruent   Thought process:   Logical, linear, clear, coherent, goal directed   Thought content:      Endorses chronic, passive suicidal ideation but no suicidal ideation today   Perceptual disturbances:     Denies auditory and visual hallucinations, behavior not concerning for response to internal stimuli     Orientation:   Oriented to person, place, time, and general circumstances   Attention:   Able to fully attend without fluctuations in consciousness   Concentration:   Able to fully concentrate and attend   Memory:    MMSE 25/30    Fund of knowledge:    Consistent with level of education and development   Insight:     Fair   Judgment:    Fair   Impulse Control:   Fair     Physical Exam:   Gen: NAD  Pulm: No increased work of breathing  Neuro: No tremors, tics noted, no abnormal movements    A/P: Alexis Burns is a 56 y.o. who presents for initiation of index series of ECT as an inpatient for treatment of major depressive disorder, recurrent, severe, without psychotic features. Baseline BDI of 51 and MMSE of 25. Started index ECT today with LFRT electrode placement. Lowered propfol today to 130 mg and increased energy to 55% with need to restim. Increased to 70% with good seizure length but poor amplitude and no motor. Could consider that she is too far above seizure threshold, but this is a possibility but less likely than needing high energy requirement. Would consider switching to bilateral on treatment 4 or 5 if no improvement clinically and if she remains with the need for restimulations.       Post-Treatment Complications: None    Recommendations for Future Treatments:  as listed in book     Next Treatment Date: 5/24, 5/26    Meds to take prior to ECT: None    Special Instructions: Do not take these medications after 6pm on the night before ECT: Klonopin/clonazepam (prescribed as an outpatient but not currently prescribed inpatient)    Assisted By: Thornell Sartorius, MD    Gabriel Cirri, MD

## 2021-07-23 NOTE — Unmapped (Signed)
Psychiatry      Daily Progress Note      Admit date/time: 07/19/2021  5:08 PM   LOS: 4 days      Assessment:  Alexis Burns is a 56 y.o., White race, Not Hispanic, Latino/a, or Spanish origin ethnicity,  ENGLISH speaking female with a history of Major depressive disorder, multiple suicide attempts, who presents for evaluation of Severe depression, plan for ECT.     Alexis Burns is an [facility] patient since 2019 with a history of depression and anxiety, with past hospitalizations in 2017 at [facility] and admission to crisis unit in 2021 for suicidal ideation. Patient presents as a direct admission from clinic after discussion about initiation of index series of ECT.  She describes lifelong mood symptoms that have acutely worsened in the last year in the setting of worsening health.  Follows with [facility] clinic.  Patient has had significant depressed mood, cognitive delays, tearfulness, decreased appetite, poor sleep, passive suicidal ideation which has been managed in the outpatient setting with extensive safety planning.  Patient has noted that these symptoms have lasted for 8 months, however, she does not recall the last time that she was happy.  From patient's husband, there have been times where patient has been happy and appears that life stressors will push patient's into a depressive episode.  Patient's symptoms consistent with major depressive disorder.       Patient has long history of mood symptoms dating back to her 67s, she has been established w/ [facility] since 2019. September 2021 she was admitted to the [facility] for suicidal ideation with a plan to shoot herself. Per outpatient documentation: She has had multiple previous med trials including: past medication trials include Prozac (stopped working), risperidone (brief trial), lithium (intolerable side effects), buspirone (unhelpful at 20mg  TID), duloxetine (90mg  well-tolerated but not helpful on its own), aripiprazole (as adjunct for duloxetine, eventually stopped due to tremors), Wellbutrin (anxiety, dry mouth), Seroquel (ineffective), Cytomel (dry mouth, joint pain).  Most recently, she is taking Pristiq 100 mg and Klonopin PRN for anxiety.  She is on the ECT schedule for MWF.      At this time, we will discontinue effexor due to patient's report of lack of efficacy and development of floaters. Will not initiate a new antidepressant at this time as patient had ECT this AM and is unable to fully participate in a conversation about different medication options. Plan to consult medicine today given patient's gait instability and hypotension in the hospital, as well as her report of worsening memory problems.     As of 07/23/2021, continued hospitalization is warranted given severe major depressive symptom burden, suicidal ideation, continued treatment with ECT.      Diagnoses:   Active Problems:    Severe episode of recurrent major depressive disorder, 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)    Plan:  Safety:  -- Continue admission to inpatient psychiatric unit for safety, stabilization, and treatment.   -- Admission status: Voluntary  -- On unit/off unit level of supervision: q15 min checks / 1:1 (staff:patient)  -- Medical equipment: The treatment team has determined that this patient requires the continued use of medical equipment  walker for their care and healing. Specific medical rationale for need for equipment: impaired mobility and fall risk,    Psychiatry:  # Major Depressive Disorder vs Persistent depressive disorder   -- Continue ECT MWF    -- Discontinue Effexor XR 75 mg daily while inpatient given side effects of floaters vs  visual changes, will continue to think about her antidepressant options   -- MMSE score was 25, BDI score was 51; repeat MMSE/BDI Tuesday or Thursday   -- TSH wnl     # Anxiety  -- Stop home klonopin (0.5 mg in AM, lunch and 1 mg at bedtime)  -- Start hydroxyzine 25 mg PRN for anxiety     # Insomnia  -- Trazodone 25 mg at bedtime PRN for sleep     Medical:  # 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   -- Primary team to follow up with family, outpatient resources    Patient was seen and plan of care was discussed with the Attending MD, Dr. Perrin Maltese, who agrees with the above statement and plan.     [name], MS3     I attest that I have reviewed the medical student note and that the components of the history of the present illness, the physical exam, and the assessment and plan documented were performed by me or were performed in my presence by the student where I verified the documentation and performed (or re-performed) the exam and medical decision making.     Latrelle Dodrill, MD    Subjective:    Hours of sleep overnight :   7.75 hrs.     The treatment team, including resident and attending physicians, nursing staff, occupational/recreational therapy, and social work/case management, met to discuss the patient's progress and plan of care.    Per 07/23/21 RN Epic note:   PT calm and cooperate. Denied SI, HI, AV/H. Medication complaint. Ambulate with walker, free of fall. Visible in milieu. NPO after MN. No unsafe behavior noticed. Continue POC    MD interview:   Alexis Burns post ECT, stated she was tired and had a headache that she received tylenol for. Reports that last time she took effexor, she had visual disturbances, and she feels she is developing these again. She describes these visual disturbances as floaters. Feels they were not present with Pristiq. Reports Pristiq was not very helpful for her. Discussed different options for antidepressant, including discontinuing Effexor today and not immediately replacing with an alternative given that she is receiving ECT 3x weekly. Decided on plan to stop Effexor today, discuss alternatives on a day that she did not receive ECT and is less tired.      Objective:    Vitals:  Patient Vitals for the past 12 hrs:   BP Temp Temp src Pulse Resp SpO2   07/23/21 1154 116/76 -- -- 93 20 100 %   07/23/21 1153 -- -- -- 86 20 100 %   07/23/21 1152 115/84 -- -- 85 20 --   07/23/21 1149 127/89 -- -- 81 18 99 %   07/23/21 1111 120/78 -- -- 88 20 --   07/23/21 1108 133/98 -- -- 82 20 --   07/23/21 1105  108/68 -- -- 73 18 99 %   07/23/21 1032 122/78 -- -- 85 18 99 %   07/23/21 1027 125/82 -- -- 96 20 98 %   07/23/21 1024 120/86 36.5 ??C (97.7 ??F) Temporal 91 20 99 %   07/23/21 0936 135/85 -- -- 99 12 97 %   07/23/21 0933 135/90 -- -- 106 18 97 %   07/23/21 0930 124/88 -- -- 98 17 98 %   07/23/21 0927 120/81 -- -- 101 18 96 %   07/23/21 0923 117/75 -- -- 104 24 95 %       Mental Status Exam:    Appearance:     Appears older than stated age, laying in bed after ECT    Motor:   No abnormal movements   Speech/Language:    Normal rate, volume, tone, fluency   Mood:    Okay   Affect:   Dysthymic and Mood congruent   Thought process:   Logical, linear, clear, coherent, goal directed   Thought content:      Denies active SI   Perceptual disturbances:     Denies auditory and visual hallucinations, behavior not concerning for response to internal stimuli     Insight:     Fair   Judgment:    Fair   Impulse Control:   Fair   Other:         PE:   Gen: No acute distress, well developed and well nourished  Resp: Normal work of breathing  Neuro: no tics/tremors    Vital Signs:   Vitals:    07/23/21 1154   BP: 116/76   Pulse: 93   Resp: 20   Temp:    SpO2: 100%       Test Results:  Data Review:   Lab results last 24 hours:    Recent Results (from the past 24 hour(s))   Basic Metabolic Panel    Collection Time: 07/23/21 11:44 AM   Result Value Ref Range    Sodium 146 (H) 135 - 145 mmol/L    Potassium 4.2 3.4 - 4.8 mmol/L    Chloride 113 (H) 98 - 107 mmol/L    CO2 28.0 20.0 - 31.0 mmol/L    Anion Gap 5 5 - 14 mmol/L    BUN 10 9 - 23 mg/dL    Creatinine 1.61 0.96 - 0.80 mg/dL    BUN/Creatinine Ratio 14     eGFR CKD-EPI (2021) Female >90 >=60 mL/min/1.31m2    Glucose 82 70 - 179 mg/dL    Calcium 9.0 8.7 - 04.5 mg/dL   CBC w/ Differential    Collection Time: 07/23/21 11:44 AM   Result Value Ref Range    WBC 3.8 3.6 - 11.2 10*9/L    RBC 4.38 3.95 - 5.13 10*12/L    HGB 13.1 11.3 - 14.9 g/dL    HCT 40.9 81.1 - 91.4 %    MCV 88.5 77.6 - 95.7 fL    MCH 29.9 25.9 - 32.4 pg    MCHC 33.8 32.0 - 36.0 g/dL    RDW 78.2 95.6 - 21.3 %    MPV 8.0 6.8 - 10.7 fL    Platelet 69 (L) 150 - 450 10*9/L    Neutrophils % 55.6 %    Lymphocytes % 33.0 %    Monocytes % 7.5 %    Eosinophils % 3.0 %    Basophils % 0.9 %    Absolute Neutrophils 2.1 1.8 - 7.8 10*9/L  Absolute Lymphocytes 1.3 1.1 - 3.6 10*9/L    Absolute Monocytes 0.3 0.3 - 0.8 10*9/L    Absolute Eosinophils 0.1 0.0 - 0.5 10*9/L    Absolute Basophils 0.0 0.0 - 0.1 10*9/L     Imaging: None    Psychometrics:  PHQ-9 PHQ-9 TOTAL SCORE   07/22/2021   8:00 PM 26   07/19/2021   7:23 PM 26   06/18/2021   5:00 PM 24   06/05/2021   9:00 AM 26   11/11/2019   8:00 PM 13         Social Determinants of Health with Concerns     Internet Connectivity: Not on file   Tobacco Use: Medium Risk    Smoking Tobacco Use: Former    Smokeless Tobacco Use: Never    Passive Exposure: Not on file   Substance Use: Not on file   Physical Activity: Not on file   Interpersonal Safety: Not on file   Stress: Not on file   Intimate Partner Violence: Not on file   Social Connections: Not on file        ---  Time-based billing disclaimer:  I personally spent 40   minutes face-to-face and non-face-to-face in the care of this patient, which includes all pre, intra, and post visit time on the date of service.  All documented time was specific to the E/M visit and does not include any procedures that may have been performed.

## 2021-07-23 NOTE — Unmapped (Signed)
PT calm and cooperate. Denied SI, HI, AV/H. Medication complaint. Ambulate with walker, free of fall. Visible in milieu. NPO after MN. No unsafe behavior noticed. Continue POC.  Problem: Adult Behavioral Health Plan of Care  Goal: Plan of Care Review  07/22/2021 2145 by Gwenevere Ghazi, RN  Outcome: Progressing  07/22/2021 2141 by Gwenevere Ghazi, RN  Outcome: Progressing  07/22/2021 2140 by Gwenevere Ghazi, RN  Outcome: Progressing  Goal: Patient-Specific Goal (Individualization)  07/22/2021 2145 by Gwenevere Ghazi, RN  Outcome: Progressing  07/22/2021 2141 by Gwenevere Ghazi, RN  Outcome: Progressing  07/22/2021 2140 by Gwenevere Ghazi, RN  Outcome: Progressing  Flowsheets (Taken 07/22/2021 2140)  Patient-Specific Goals (Include Timeframe): Have ECTfor 07/23/21.  Goal: Adheres to Safety Considerations for Self and Others  07/22/2021 2145 by Gwenevere Ghazi, RN  Outcome: Progressing  07/22/2021 2141 by Gwenevere Ghazi, RN  Outcome: Progressing  07/22/2021 2140 by Gwenevere Ghazi, RN  Outcome: Progressing  Goal: Absence of New-Onset Illness or Injury  07/22/2021 2145 by Gwenevere Ghazi, RN  Outcome: Progressing  07/22/2021 2141 by Gwenevere Ghazi, RN  Outcome: Progressing  07/22/2021 2140 by Gwenevere Ghazi, RN  Outcome: Progressing  Intervention: Prevent Skin Injury  Recent Flowsheet Documentation  Taken 07/22/2021 2000 by Gwenevere Ghazi, RN  Skin Protection:   adhesive use limited   protective footwear used  Intervention: Prevent Infection  Recent Flowsheet Documentation  Taken 07/22/2021 2000 by Gwenevere Ghazi, RN  Infection Prevention:   cohorting utilized   environmental surveillance performed   equipment surfaces disinfected   hand hygiene promoted   personal protective equipment utilized   rest/sleep promoted   visitors restricted/screened   single patient room provided  Goal: Optimized Coping Skills in Response to Life Stressors  07/22/2021 2145 by Gwenevere Ghazi, RN  Outcome: Progressing  07/22/2021 2141 by Gwenevere Ghazi, RN  Outcome: Progressing  07/22/2021 2140 by Gwenevere Ghazi, RN  Outcome: Progressing  Goal: Develops/Participates in Therapeutic Alliance to Support Successful Transition  07/22/2021 2145 by Gwenevere Ghazi, RN  Outcome: Progressing  07/22/2021 2141 by Gwenevere Ghazi, RN  Outcome: Progressing  07/22/2021 2140 by Gwenevere Ghazi, RN  Outcome: Progressing  Goal: Rounds/Family Conference  07/22/2021 2145 by Gwenevere Ghazi, RN  Outcome: Progressing  07/22/2021 2141 by Gwenevere Ghazi, RN  Outcome: Progressing  07/22/2021 2140 by Gwenevere Ghazi, RN  Outcome: Progressing     Problem: Fall Injury Risk  Goal: Absence of Fall and Fall-Related Injury  07/22/2021 2145 by Gwenevere Ghazi, RN  Outcome: Progressing  07/22/2021 2141 by Gwenevere Ghazi, RN  Outcome: Progressing  07/22/2021 2140 by Gwenevere Ghazi, RN  Outcome: Progressing  Intervention: Promote Injury-Free Environment  Recent Flowsheet Documentation  Taken 07/22/2021 2000 by Gwenevere Ghazi, RN  Safety Interventions:   low bed   fall reduction program maintained   infection management   nonskid shoes/slippers when out of bed     Problem: Suicide Risk  Goal: Absence of Self-Harm  07/22/2021 2145 by Gwenevere Ghazi, RN  Outcome: Progressing  07/22/2021 2141 by Gwenevere Ghazi, RN  Outcome: Progressing  07/22/2021 2140 by Gwenevere Ghazi, RN  Outcome: Progressing  Intervention: Assess Risk to Self and Maintain Safety  Recent Flowsheet Documentation  Taken 07/22/2021 2000 by Gwenevere Ghazi, RN  Behavior Management:   boundaries reinforced   impulse control promoted   security enhancements provided  Intervention: Promote Psychosocial Wellbeing  Recent Flowsheet Documentation  Taken 07/22/2021 2000 by Gwenevere Ghazi, RN  Sleep/Rest Enhancement:   awakenings minimized   consistent schedule promoted  Problem: Asthma Comorbidity  Goal: Maintenance of Asthma Control  07/22/2021 2145 by Gwenevere Ghazi, RN  Outcome: Progressing  07/22/2021 2141 by Gwenevere Ghazi, RN  Outcome: Progressing  07/22/2021 2140 by Gwenevere Ghazi, RN  Outcome: Progressing     Problem: Behavioral Health Comorbidity  Goal: Maintenance of Behavioral Health Symptom Control  07/22/2021 2145 by Gwenevere Ghazi, RN  Outcome: Progressing  07/22/2021 2141 by Gwenevere Ghazi, RN  Outcome: Progressing  07/22/2021 2140 by Gwenevere Ghazi, RN  Outcome: Progressing     Problem: Hypertension Comorbidity  Goal: Blood Pressure in Desired Range  07/22/2021 2145 by Gwenevere Ghazi, RN  Outcome: Progressing  07/22/2021 2141 by Gwenevere Ghazi, RN  Outcome: Progressing  07/22/2021 2140 by Gwenevere Ghazi, RN  Outcome: Progressing

## 2021-07-23 NOTE — Unmapped (Signed)
Patient has been calm, pleasant and cooperative. Shoulder pain managed with tylenol. Patient c/o anxiety which was relieved with Atarax. Patient spent most of the day resting in bed, and out in the dayroom for meals. No distress noted. Vs noted running low. Dinnertime orthostatic VS is pending. Gaits noted steady with use of walker. Denies SI/HI/AVH. Continue to offer support and encouragement.  Problem: Adult Behavioral Health Plan of Care  Goal: Plan of Care Review  Outcome: Progressing  Flowsheets (Taken 07/22/2021 1707)  Progress: improving  Plan of Care Reviewed With: patient  Patient Agreement with Plan of Care: agrees  Goal: Patient-Specific Goal (Individualization)  Outcome: Progressing  Flowsheets (Taken 07/22/2021 1707)  Patient-Specific Goals (Include Timeframe): Wants to be freefrm pain.07/22/21  Patient Personal Strengths:   expressive of emotions   expressive of needs   motivated for recovery   motivated for treatment   medication/treatment adherence   coping skills   appropriate judgment/decision-making  Anxieties, Fears or Concerns: Denies.  Goal: Adheres to Safety Considerations for Self and Others  Outcome: Progressing  Flowsheets (Taken 07/22/2021 1707)  Adheres to Safety Considerations for Self and Others: making progress toward outcome  Goal: Absence of New-Onset Illness or Injury  Outcome: Progressing  Intervention: Prevent Skin Injury  Recent Flowsheet Documentation  Taken 07/22/2021 1151 by Moss Mc, RN  Skin Protection: adhesive use limited  Intervention: Prevent Infection  Recent Flowsheet Documentation  Taken 07/22/2021 1151 by Moss Mc, RN  Infection Prevention:   cohorting utilized   hand hygiene promoted   rest/sleep promoted  Goal: Optimized Coping Skills in Response to Life Stressors  Outcome: Progressing  Flowsheets (Taken 07/22/2021 1707)  Optimized Coping Skills in Response to Life Stressors: making progress toward outcome  Goal: Develops/Participates in Therapeutic Alliance to Support Successful Transition  Outcome: Progressing  Goal: Rounds/Family Conference  Outcome: Progressing     Problem: Impaired Wound Healing  Goal: Optimal Wound Healing  Outcome: Progressing  Intervention: Promote Wound Healing  Recent Flowsheet Documentation  Taken 07/22/2021 1151 by Moss Mc, RN  Activity Management:   activity adjusted per tolerance   activity encouraged     Problem: Fall Injury Risk  Goal: Absence of Fall and Fall-Related Injury  Outcome: Progressing  Intervention: Promote Injury-Free Environment  Recent Flowsheet Documentation  Taken 07/22/2021 1151 by Moss Mc, RN  Safety Interventions:   elopement precautions   fall reduction program maintained     Problem: Suicide Risk  Goal: Absence of Self-Harm  Outcome: Progressing  Intervention: Assess Risk to Self and Maintain Safety  Recent Flowsheet Documentation  Taken 07/22/2021 1144 by Moss Mc, RN  Behavior Management: behavioral plan reviewed     Problem: Asthma Comorbidity  Goal: Maintenance of Asthma Control  Outcome: Progressing     Problem: Behavioral Health Comorbidity  Goal: Maintenance of Behavioral Health Symptom Control  Outcome: Progressing     Problem: Hypertension Comorbidity  Goal: Blood Pressure in Desired Range  Outcome: Progressing

## 2021-07-23 NOTE — Unmapped (Signed)
PHYSICAL THERAPY  Evaluation (07/23/21 1331)          Patient Name:  Alexis Burns [facility]       Medical Record Number: [id number]   Date of Birth: 13-Nov-1965  Sex: Female        Treatment Diagnosis: mobility deficits in setting acute psych admission, ECT treatment and acute on chronic left shld pain     Activity Tolerance: Tolerated treatment well     ASSESSMENT  Problem List: Pain, Decreased endurance      Assessment : 56 y.o., White race, Not Hispanic, Latino/a, or Spanish origin ethnicity,  ENGLISH speaking female with a history of Major depressive disorder, multiple suicide attempts, who presents for evaluation of Severe depression, plan for ECT.  Patient presents in bed and demonstrated the ability to transfer and ambulate on unit indep without device, limited primarily by without shoes from home so forced to ambulate in socks.  Patient reports feeling safe ambulating on unit without device except for an hour or two following ECT when she prefers to use RW for additional safety.  Pt reports her mobility is currently much better that PTA and she encouraged that she will be able to return to full indep mobility without device.   Anticipate continued strong mobility progression with continued acute and post acute care Physical Therapy follow up.   Please see Daily Intervention and Today's Intervention sections for additional session detail and left shld intervention.  After a review of the personal factors, comorbidities, clinical presentation, and examination of the number of affected body systems, the patient presents as a low complexity case.      Today's Interventions: Mobility assessment completed.  Therex/Theract: standing wt shifts, LUE AROM to first resistance.  Left Shld: supraspinatus localized pain with palpation, although empty can test inconclusive.   Pt Education: POC, anticipated course of cont recovery, potential benefits of MD/OPPT follow up to address recent increase in right shld pain following fall, benefits of frequent ambulation on unit with or without device to maintain endurance and functional indep in preparation for returning home.  All questions answered.  Patient verbalized understanding and stated intention to comply with recommendations.                            PLAN  Planned Frequency of Treatment:  D/C Services for: D/C Services       Planned Interventions:       Post-Discharge Physical Therapy Recommendations:  3x weekly, Community Ambulator (acute on/chronic left shld pain)     PT DME Recommendations: Rolling walker            Goals:   Patient and Family Goals: to improve left shld pain/function              SHORT GOAL #1: pt currently indep with all assessment mobility and without additional skilled acute care PT mobility needs/goals                                                    AM-PAC-6 click    Difficulty turning over In bed?: None - Modified Independent/Independent  Difficulty sitting down/standing up from chair with arms? : None - Modified Independent/Independent  Difficulty moving from supine to sitting on edge of bed?: None - Modified Independent/Independent  Help moving to  and from bed from wheelchair?: None - Modified Independent/Independent  Help currently needed walking in a hospital room?: None - Modified Independent/Independent  Help currently needed climbing 3-5 steps with railing?: None - Modified Independent/Independent    Basic Mobility Score:  Basic Mobility Score 6 click: 24    6 click  Score (in points): % of Functional Impairment, Limitation, Restriction  6: 100% impaired, limited, restricted  7-8: At least 80%, but less than 100% impaired, limited restricted  9-13: At least 60%, but less than 80% impaired, limited restricted  14-19: At least 40%, but less than 60% impaired, limited restricted  20-22: At least 20%, but less than 40% impaired, limited restricted  23: At least 1%, but less than 20% impaired, limited restricted  24: 0% impaired, limited restricted                                             Prognosis:  Good  Positive Indicators: improving mobility, strong safety awareness        SUBJECTIVE  Patient reports: agreeable to session  Current Functional Status: presents in room and left in group     Prior Functional Status: pt reporting indep with all without device.  h/o left FTC surgery last summer with ~53month h/o of increasing left shld pain following a fall  Equipment available at home: None      Past Medical History:   Diagnosis Date    ADD (attention deficit disorder)     Anxiety Since childhood    Arthritis 10 years    Asthma     cough variant asthma per pt report    Binge eating disorder     Cirrhosis (CMS-HCC)     Depression 04/11/2016    Hyperlipidemia 20 years    Not interested in meds    Hypertension     Obesity Since childhood            Social History     Tobacco Use    Smoking status: Former     Packs/day: 1.00     Years: 15.00     Pack years: 15.00     Types: Cigarettes     Quit date: 04/25/1994     Years since quitting: 27.2    Smokeless tobacco: Never   Substance Use Topics    Alcohol use: Not Currently       Past Surgical History:   Procedure Laterality Date    BREAST BIOPSY Left     many biopsies    BREAST CYST ASPIRATION Right 2003    abcess    BREAST EXCISIONAL BIOPSY Left 2014ish    CESAREAN SECTION      CHOLECYSTECTOMY      HERNIA REPAIR  2020    HYSTERECTOMY  2001    partial    OOPHORECTOMY      still has one ovary    PR COLONOSCOPY W/BIOPSY SINGLE/MULTIPLE N/A 04/19/2020    Procedure: COLONOSCOPY, FLEXIBLE, PROXIMAL TO SPLENIC FLEXURE; WITH BIOPSY, SINGLE OR MULTIPLE;  Surgeon: Luanne Bras, MD;  Location: [facility];  Service: Gastroenterology    PR REPAIR INCISIONAL HERNIA,STRANG Midline 09/25/2018    Procedure: REPAIR INITIAL INCISIONAL OR VENTRAL HERNIA; INCARCERATED OR STRANGULATED;  Surgeon: Colon Branch, MD;  [facility] OR [facility];  Service: General Surgery    PR SHLDR ARTHROSCOP,SURG,W/ROTAT CUFF REPR Left  09/25/2020    Procedure: ARTHROSCOPY, SHOULDER, SURGICAL; WITH ROTATOR CUFF REPAIR;  Surgeon: Tomasa Rand, MD;  [facility];  Service: Orthopedics    PR UPPER GI ENDOSCOPY,DIAGNOSIS N/A 04/19/2020    Procedure: UGI ENDO, INCLUDE ESOPHAGUS, STOMACH, & DUODENUM &/OR JEJUNUM; DX W/WO COLLECTION SPECIMN, BY BRUSH OR WASH;  Surgeon: Luanne Bras, MD;  Location: [facility];  Service: Gastroenterology    WISDOM TOOTH EXTRACTION               Family History   Problem Relation Age of Onset    Lymphoma Mother     Diabetes Mother     Hypertension Mother     Skin cancer Mother     Arthritis Mother     Cancer Mother         lymph    Kidney disease Mother     Vision loss Mother         Macular D.    Parkinsonism Father     Hypertension Father     Colon cancer Father 64    Macular degeneration Father     Alcohol abuse Father     Cancer Father         colon    Depression Father     Vision loss Father         Macular D.    Parkinsonism Sister     Tremor Sister         essential tremor, hands and voice    Seizures Sister     Migraines Sister     Macular degeneration Sister     Miscarriages / [place] Sister     Vision loss Sister         Macular d    Skin cancer Sister     Tremor Sister         no diagnosis    Migraines Sister     Macular degeneration Sister     Vision loss Sister         Macular d    Skin cancer Maternal Grandmother     Stroke Maternal Grandmother     Alcohol abuse Paternal Grandmother     Heart disease Paternal Grandfather     Cancer Son         Spinal cancer, leaving pt disabled    Diabetes Son     Mental illness Son     Neuropathy Son     Depression Son     Drug abuse Son     Hypertension Son     Kidney disease Son     Vision loss Son         Diabetes    Mental illness Son     Intellectual Disability Son     Learning disabilities Son         Allergies: Haldol [haloperidol lactate], Cyclosporine, Tetracycline, and Tramadol Objective Findings  Precautions / Restrictions  Precautions: Psych Safety precautions  Weight Bearing Status: Non-applicable  Required Braces or Orthoses: Non-applicable     Communication Preference: Verbal          Pain Comments: intermittent left shld pain. >8/10 with direct palpation. pt reports taking tylenol for pain     Equipment / Environment: None           Orthostatics: asymptomatic        Living Situation  Living Environment: House      Cognition: WFL  Cognition comment: pleasant and  cooperative throughout session  Visual/Perception: Wears Glasses/Contacts (reading)     Skin Inspection comment: visable skin grossly intact     Upper Extremities  UE comment: right WFL.  LEft: grossly normal AROM however with painful arch shld FF starting approx 110degrees.  point tenderness supraspinatus fossa palpation, inconclusive empty can test.    Lower Extremities  LE comment: WFL bilat     Sensation comment: SILt  Balance comment: static sitting and standing: indep without device      Bed Mobility: supine to/from [id number]     Transfer comments: sit to/from stand: indep without device      Gait Level of Assistance: Independent  Gait Assistive Device: None  Gait Distance Ambulated (ft): 75 ft  Gait: approx. pt reports that she ambulates ad lib on unit with and without device. uses RW with all ambulation following ECT treatement for an hour our two due to transient unsteadiness. Denies fall since admission and demonstrated strong safety awareness.     Stairs: NT                  Physical Therapy Session Duration  PT Individual [mins]: 36           I attest that I have reviewed the above information.  Signed: Henrene Pastor, PT  Filed 07/23/2021

## 2021-07-23 NOTE — Unmapped (Signed)
Internal Medicine Consult Service    Assessment and Recommendations:   Alexis Burns is a 56 y.o. female with a PMHx of HTN, HLD, obesity, NASH cirrhosis, major depression that presented to [facility] for evaluation and depression and plan for ECT. Pt was seen at the request of Bryn Gulling, MD (Psychiatry (PSY)) for orthostatic hypotension, gait instability, worsening memory.    # Orthostatic Hypotension  Noted to experience mild lightheadedness and imbalance in the mornings that typically spontaneously resolve. Orthostatic vitals recorded in Vitals tab demonstrate BP 114/75 to 85/66 on 5/21PM, though patient body orientation not documented and thus difficult to interpret. Flowsheet values demonstrate only standing BP with systolics 80s-90s, possibly in setting of NPO for ECT. Most recent values today demonstrate 122/78 lying to 116/76 standing after 1 hour. Patient able to tolerate PO without issue.  - hold home amlodipine  - encourage appropriate PO intake, repeat orthostatics when well hydrated  - if no improvement, plan to investigate alternative causes including medication-induced vs less likely dysautonomia.    # Memory Issues - Mild Cognitive Impairment?  Unclear subjective history of cognitive impairment for several months. Recently evaluated by Neurology 05/2021. At that time, patient was noted to exhibit non-amnestic, primarily attention/concentration and dysexecutive impairment. Imaging without clear evidence of neurodegeneration, cerebrovascular disease, or ischemic injury as significant contributors. Given lack of acute changes in cognition, would agree that most likely drivers include known severe depression potentially confounded by ongoing benzodiazepine use. Hepatic encephalopathy lower on differential given nml LFTs, BUN, and ammonia levels, lack of asterixis on exam. MMSE 25 this admission.   - continue treatment for major depression per Psych team  - clonazepam last filled 07/2021   - recommend discontinuation of clonazepam   - consider scheduled non-benzodiazepine anxiolytic (e.g. Atarax) rather than prn  - recommend ongoing outpatient longitudinal Neurology follow up   - continue rifaximin, titrate lactulose to goal 1 BM/day; this can be given as prn    # Gait instability - Essential tremor  Unclear history of current gait instability. Documented timeline suggests gait was assessed as nml as of 07/19/21, but unstable 07/20/21 with pt report that they ambulate by hugging walls.  L foot strain 09/2015 requiring temporary CAM walker. R ankle injury 12/2018 temporarily requiring CAM walker. Per primary team, gait currently steady with walker. On interview with Internal Medicine team, patient affirms that gait is transiently unsteady following ECT, and otherwise only abnormal in setting of anxiety-related tremor. Also affirms Hx progressive essential tremor and FHx Parkinsons. Ongoing depression, anxiety, benzodiazepine use, and intermittent hypotension likely contributors - recommendations as above.   - recommendations as above  - Consider discussing with outpatient Neurologist regarding potential indication for evaluation by Movement specialist in outpatient setting given progressive essential tremor and FHx Parkinsons.    For questions between 7:30AM-5PM, please page the Medicine Consult Service pager at [phone]. After 5PM, the Medicine Consult Service is covered by the [facility] On-Call Resident for urgent/emergent questions or concerns.     Reason for Consultation:   Pt was seen at the request of Bryn Gulling, MD (Psychiatry (PSY)) in consultation for orthostatic hypotension, gait instability, worsening memory.    Subjective:   HPI:  Alexis Burns is a 56 y.o. female with PMHx of HTN, HLD, obesity, NASH cirrhosis, major depression that presented to [facility] for evaluation and depression and plan for ECT.     Patient states that issues with orthostatic hypotension are intermittent, mild, and longstanding. Typically presents occasionally with  mild lightheadedness first thing in the morning and resolves spontaneously. Affirms that episode of hypotension yesterday was associated with some lightheadedness, but otherwise is unable to identify any alleviating/aggravating factors. Denies syncopal episodes. Currently feels overall well and that PO intake may be potentially helpful.     Endorses issues with concentration/attention/memory over the past several months. States that these symptoms began following shoulder surgery mid 2022, and likens the sensation to not fully recovering cognitively from the effects of anesthesia. Affirms that she has had multiple evaluations by Neurology, including cognitive testing and sleep studies. Expresses displeasure that the sleep study demonstrated apneic events while supine, as she typically sleeps prone. She is concerned that this may be sequelae of hepatic encephalopathy though is unable to specify any signs or symptoms suggestive of hepatic etiology. States that her lactulose had been discontinued a few weeks ago, but has been continuing to take rifaximin. States that with lactulose she has ~15BM/day, and without lactulose she has 1BM q3d.     Feels that her gait is currently at baseline without deficits. States that she feels some unsteady gait for approx 1hr after ECT. After additional prompting, she states that she may experience some anxiety-related tremor/gait instability, for which her TID Klonopin has been helpful. Also affirms history of essential tremor and FHx Parkinsons disease in parent.    Allergies:  Haldol [haloperidol lactate], Cyclosporine, Tetracycline, and Tramadol    Medications:   Prior to Admission medications    Medication Dose, Route, Frequency   acetaminophen (TYLENOL) 500 MG tablet 1,000 mg, Oral, Every 6 hours PRN   albuterol HFA 90 mcg/actuation inhaler 2 puffs, Inhalation, Every 6 hours PRN   amLODIPine (NORVASC) 10 MG tablet 10 mg, Oral, Daily (standard), For high blood pressure   clonazePAM (KLONOPIN) 0.5 MG tablet 0.5 mg, Oral, 3 times a day PRN   desvenlafaxine succinate (PRISTIQ) 100 MG 24 hr tablet 100 mg, Oral, Daily (standard)   ibuprofen (ADVIL,MOTRIN) 200 MG tablet 200 mg, Every 6 hours PRN  Patient not taking: Reported on 07/10/2021   lactulose 10 gram/15 mL solution 20 g, Oral, 3 times a day (standard)   lactulose 10 gram/15 mL solution 20 g, Oral, 3 times a day (standard)   MAGNESIUM ORAL 30 mg, 2 times a day (standard)  Patient not taking: Reported on 07/10/2021   nystatin (MYCOSTATIN) 100,000 unit/gram powder Apply to affected area 3 times daily  Patient not taking: Reported on 07/10/2021   potassium chloride 20 MEQ CR tablet 20 mEq, 2 times a day (standard)  Patient not taking: Reported on 07/10/2021   XIFAXAN 550 mg Tab 550 mg, Oral, 2 times a day (standard)   SUMAtriptan (IMITREX) 50 MG tablet 50 mg, Oral, Every 2 hours PRN   traZODone (DESYREL) 50 MG tablet TAKE 1 TABLET NIGHTLY AS NEEDED FOR SLEEP, AND TAKE ONE-HALF (1/2) TABLET UP TO TWICE A DAY AS NEEDED FOR ANXIETY  Patient taking differently: 0.5 tablets (25 mg total) nightly. Taking 1/2 tab at night   triamcinolone (KENALOG) 0.1 % ointment 1 application., Topical, As needed (once a day), Apply sparingly as needed to affected areas of body for itching        Medical History:  Past Medical History:   Diagnosis Date    ADD (attention deficit disorder)     Anxiety Since childhood    Arthritis 10 years    Asthma     cough variant asthma per pt report    Binge eating disorder  Cirrhosis (CMS-HCC)     Depression 04/11/2016    Hyperlipidemia 20 years    Not interested in meds    Hypertension     Obesity Since childhood       Surgical History:  Past Surgical History:   Procedure Laterality Date    BREAST BIOPSY Left     many biopsies    BREAST CYST ASPIRATION Right 2003    abcess    BREAST EXCISIONAL BIOPSY Left 2014ish    CESAREAN SECTION      CHOLECYSTECTOMY      HERNIA REPAIR  2020 HYSTERECTOMY  2001    partial    OOPHORECTOMY      still has one ovary    PR COLONOSCOPY W/BIOPSY SINGLE/MULTIPLE N/A 04/19/2020    Procedure: COLONOSCOPY, FLEXIBLE, PROXIMAL TO SPLENIC FLEXURE; WITH BIOPSY, SINGLE OR MULTIPLE;  Surgeon: Luanne Bras, MD;  Location: [facility];  Service: Gastroenterology    PR REPAIR INCISIONAL HERNIA,STRANG Midline 09/25/2018    Procedure: REPAIR INITIAL INCISIONAL OR VENTRAL HERNIA; INCARCERATED OR STRANGULATED;  Surgeon: Colon Branch, MD;  Location: [facility] OR [facility];  Service: General Surgery    PR SHLDR ARTHROSCOP,SURG,W/ROTAT CUFF REPR Left 09/25/2020    Procedure: ARTHROSCOPY, SHOULDER, SURGICAL; WITH ROTATOR CUFF REPAIR;  Surgeon: Tomasa Rand, MD;  Location: [facility] OR [facility];  Service: Orthopedics    PR UPPER GI ENDOSCOPY,DIAGNOSIS N/A 04/19/2020    Procedure: UGI ENDO, INCLUDE ESOPHAGUS, STOMACH, & DUODENUM &/OR JEJUNUM; DX W/WO COLLECTION SPECIMN, BY BRUSH OR WASH;  Surgeon: Luanne Bras, MD;  Location: [facility];  Service: Gastroenterology    WISDOM TOOTH EXTRACTION         Social History:    Social History     Tobacco Use    Smoking status: Former     Packs/day: 1.00     Years: 15.00     Pack years: 15.00     Types: Cigarettes     Quit date: 04/25/1994     Years since quitting: 27.2    Smokeless tobacco: Never   Vaping Use    Vaping Use: Former   Substance Use Topics    Alcohol use: Not Currently    Drug use: Never        Family History:  Family History   Problem Relation Age of Onset    Lymphoma Mother     Diabetes Mother     Hypertension Mother     Skin cancer Mother     Arthritis Mother     Cancer Mother         lymph    Kidney disease Mother     Vision loss Mother         Macular D.    Parkinsonism Father     Hypertension Father     Colon cancer Father 10    Macular degeneration Father     Alcohol abuse Father     Cancer Father         colon    Depression Father     Vision loss Father         Macular D. Parkinsonism Sister     Tremor Sister         essential tremor, hands and voice    Seizures Sister     Migraines Sister     Macular degeneration Sister     Miscarriages / Stillbirths Sister     Vision loss Sister  Macular d    Skin cancer Sister     Tremor Sister         no diagnosis    Migraines Sister     Macular degeneration Sister     Vision loss Sister         Macular d    Skin cancer Maternal Grandmother     Stroke Maternal Grandmother     Alcohol abuse Paternal Grandmother     Heart disease Paternal Grandfather     Cancer Son         Spinal cancer, leaving pt disabled    Diabetes Son     Mental illness Son     Neuropathy Son     Depression Son     Drug abuse Son     Hypertension Son     Kidney disease Son     Vision loss Son         Diabetes    Mental illness Son     Intellectual Disability Son     Learning disabilities Son        Review of Systems:  10 systems were reviewed and are negative unless otherwise mentioned in the HPI    Objective:   Physical Exam:  Temp:  [36.3 ??C (97.3 ??F)-36.7 ??C (98.1 ??F)] 36.5 ??C (97.7 ??F)  Heart Rate:  [69-106] 93  Resp:  [12-24] 20  BP: (85-135)/(34-98) 116/76  SpO2:  [95 %-100 %] 100 %,   Intake/Output Summary (Last 24 hours) at 07/23/2021 1246  Last data filed at 07/23/2021 0981  Gross per 24 hour   Intake 300 ml   Output --   Net 300 ml       Gen: Chronically ill-appearing female in NAD  Eyes: Sclera anicteric, EOMI, PERRLA  HENT: atraumatic, normocephalic, MMM. OP w/o erythema or exudate   Heart: RRR, S1, S2, no M/R/G, no chest wall tenderness  Lungs: CTAB, no crackles or wheezes, no use of accessory muscles  Abdomen: Normoactive bowel sounds, soft, NTND, no rebound/guarding  Extremities: no clubbing, cyanosis, or edema: pulses are +2 in bilateral upper and lower extremities  Neuro: A&Ox4. CN II-XI grossly intact, normal cerebellar function, normal gait. No focal deficits. Romberg neg. No asterixis.  Skin:  No rashes, lesions on clothed exam  Psych: Depressed mood, though answers questions appropriately    Labs/Studies:  Labs and Studies from the last 24hrs per EMR and Reviewed    Imaging: Radiology studies were personally reviewed

## 2021-07-24 MED ADMIN — aluminum-magnesium hydroxide-simethicone (MAALOX MAX) 80-80-8 mg/mL oral suspension: 30 mL | ORAL | @ 01:00:00

## 2021-07-24 MED ADMIN — lactulose (CEPHULAC) packet 20 g: 20 g | ORAL | @ 20:00:00

## 2021-07-24 MED ADMIN — acetaminophen (TYLENOL) tablet 650 mg: 650 mg | ORAL | @ 01:00:00

## 2021-07-24 MED ADMIN — rifAXIMin (XIFAXAN) tablet 550 mg: 550 mg | ORAL | @ 13:00:00 | Stop: 2021-08-18

## 2021-07-24 MED ADMIN — hydrOXYzine (ATARAX) tablet 25 mg: 25 mg | ORAL | @ 01:00:00

## 2021-07-24 MED ADMIN — traZODone (DESYREL) tablet 25 mg: 25 mg | ORAL | @ 01:00:00

## 2021-07-24 MED ADMIN — aluminum-magnesium hydroxide-simethicone (MAALOX MAX) 80-80-8 mg/mL oral suspension: 30 mL | ORAL | @ 20:00:00

## 2021-07-24 MED ADMIN — rifAXIMin (XIFAXAN) tablet 550 mg: 550 mg | ORAL | @ 01:00:00 | Stop: 2021-08-18

## 2021-07-24 NOTE — Unmapped (Signed)
The patient got two PRN medications this afternoon. She was able to take her medications in the morning. She has been calm and cooperative with staff. The patient was observed ambulating independently without her walker. Staff reminded her she needed to use her walker if feeling tired. She sat in the day room and talked with her peers. She denied SI/HI this shift. Will continue to monitor the patient.  Problem: Adult Behavioral Health Plan of Care  Goal: Plan of Care Review  Outcome: Progressing  Flowsheets (Taken 07/24/2021 1604)  Plan of Care Reviewed With: patient  Patient Agreement with Plan of Care: agrees  Goal: Patient-Specific Goal (Individualization)  Outcome: Progressing  Goal: Adheres to Safety Considerations for Self and Others  Outcome: Progressing  Goal: Absence of New-Onset Illness or Injury  Outcome: Progressing  Goal: Optimized Coping Skills in Response to Life Stressors  Outcome: Progressing  Goal: Develops/Participates in Therapeutic Alliance to Support Successful Transition  Outcome: Progressing  Goal: Rounds/Family Conference  Outcome: Progressing  Flowsheets (Taken 07/24/2021 1604)  Participants:   Milieu/Psych techs   nursing   social work   patient   pharmacy   dietitian/nutrition services   psychiatrist     Problem: Fall Injury Risk  Goal: Absence of Fall and Fall-Related Injury  Outcome: Progressing  Intervention: Promote Scientist, clinical (histocompatibility and immunogenetics) Documentation  Taken 07/24/2021 0857 by Lorayne Bender, RN  Safety Interventions:   low bed   nonskid shoes/slippers when out of bed     Problem: Suicide Risk  Goal: Absence of Self-Harm  Outcome: Progressing  Intervention: Assess Risk to Self and Maintain Safety  Recent Flowsheet Documentation  Taken 07/24/2021 0857 by Lorayne Bender, RN  Behavior Management:   behavioral plan reviewed   boundaries reinforced     Problem: Asthma Comorbidity  Goal: Maintenance of Asthma Control  Outcome: Progressing     Problem: Behavioral Health Comorbidity  Goal: Maintenance of Behavioral Health Symptom Control  Outcome: Progressing     Problem: Hypertension Comorbidity  Goal: Blood Pressure in Desired Range  Outcome: Progressing

## 2021-07-24 NOTE — Unmapped (Signed)
Problem: Adult Behavioral Health Plan of Care  Goal: Plan of Care Review  Outcome: Ongoing - Unchanged  Goal: Patient-Specific Goal (Individualization)  Outcome: Ongoing - Unchanged  Goal: Adheres to Safety Considerations for Self and Others  Outcome: Ongoing - Unchanged  Goal: Absence of New-Onset Illness or Injury  Outcome: Ongoing - Unchanged  Goal: Optimized Coping Skills in Response to Life Stressors  Outcome: Ongoing - Unchanged  Goal: Develops/Participates in Therapeutic Alliance to Support Successful Transition  Outcome: Ongoing - Unchanged  Goal: Rounds/Family Conference  Outcome: Ongoing - Unchanged     Problem: Fall Injury Risk  Goal: Absence of Fall and Fall-Related Injury  Outcome: Ongoing - Unchanged     Problem: Suicide Risk  Goal: Absence of Self-Harm  Outcome: Ongoing - Unchanged  Intervention: Assess Risk to Self and Maintain Safety  Recent Flowsheet Documentation  Taken 07/23/2021 0715 by Geanie Cooley, RN  Behavior Management:   boundaries reinforced   impulse control promoted   security enhancements provided   behavioral plan reviewed     Problem: Asthma Comorbidity  Goal: Maintenance of Asthma Control  Outcome: Ongoing - Unchanged     Problem: Behavioral Health Comorbidity  Goal: Maintenance of Behavioral Health Symptom Control  Outcome: Ongoing - Unchanged     Problem: Hypertension Comorbidity  Goal: Blood Pressure in Desired Range  Outcome: Ongoing - Unchanged

## 2021-07-24 NOTE — Unmapped (Addendum)
Psychiatry      Daily Progress Note      Admit date/time: 07/19/2021  5:08 PM   LOS: 5 days      Assessment:  Alexis Burns is a 56 y.o., White race, Not Hispanic, Latino/a, or Spanish origin ethnicity,  ENGLISH speaking female with a history of Major depressive disorder, multiple suicide attempts, who presents for evaluation of Severe depression, plan for ECT.     Alexis Burns is an ADTC patient since 2019 with a history of depression and anxiety, with past hospitalizations in 2017 at [facility] and admission to crisis unit in 2021 for suicidal ideation. Patient presents as a direct admission from clinic after discussion about initiation of index series of ECT.  She describes lifelong mood symptoms that have acutely worsened in the last year in the setting of worsening health.  Follows with ADT clinic.  Patient has had significant depressed mood, cognitive delays, tearfulness, decreased appetite, poor sleep, passive suicidal ideation which has been managed in the outpatient setting with extensive safety planning.  Patient has noted that these symptoms have lasted for 8 months, however, she does not recall the last time that she was happy.  From patient's husband, there have been times where patient has been happy and appears that life stressors will push patient's into a depressive episode.  Patient's symptoms consistent with major depressive disorder.       Patient has long history of mood symptoms dating back to her 60s, she has been established w/ [facility]TC since 2019. September 2021 she was admitted to the [facility] for suicidal ideation with a plan to shoot herself. Per outpatient documentation: She has had multiple previous med trials including: past medication trials include Prozac (stopped working), risperidone (brief trial), lithium (intolerable side effects), buspirone (unhelpful at 20mg  TID), duloxetine (90mg  well-tolerated but not helpful on its own), aripiprazole (as adjunct for duloxetine, eventually stopped due to tremors), Wellbutrin (anxiety, dry mouth), Seroquel (ineffective), Cytomel (dry mouth, joint pain).  Most recently, she is taking Pristiq 100 mg and Klonopin PRN for anxiety.  She is on the ECT schedule for MWF.      At this time, we will initiate sertraline to target her depressive symptoms in addition to ECT. Given her liver disease, will not titrate above 100mg  daily. MMSE today was 29/30 (point lost for memory).     As of 07/24/2021, continued hospitalization is warranted given severe major depressive symptom burden, suicidal ideation, continued treatment with ECT.      Diagnoses:   Active Problems:    Severe episode of recurrent major depressive disorder, 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)    Plan:  Safety:  -- Continue admission to inpatient psychiatric unit for safety, stabilization, and treatment.   -- Admission status: Voluntary  -- On unit/off unit level of supervision: q15 min checks / 1:1 (staff:patient)  -- Medical equipment: The treatment team has determined that this patient requires the continued use of medical equipment  walker for their care and healing. Specific medical rationale for need for equipment: impaired mobility and fall risk,    Psychiatry:  # Major Depressive Disorder vs Persistent depressive disorder   -- Continue ECT MWF    -- Start Sertraline 25 mg daily (s5/23)  -- s/p Effexor XR (disc 5/23) 75 mg due to SE's  -- MMSE score was 25, BDI score was 51; repeat MMSE 07/24/21 was 29  -- MMSE weekly on Tues/Thurs, BDI on Thurs  -- TSH wnl     #  Anxiety  -- Stop home klonopin (0.5 mg in AM, lunch and 1 mg at bedtime)  -- HOLD hydroxyzine 25 mg PRN for anxiety; patient reports horrific dreams    # Insomnia  -- Trazodone 25 mg at bedtime PRN for sleep     Medical:  # 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  -- Continue home xifaxin 550 mg BID   -- lactulose as PRN for constipation, recommend titrate lactulose with goal of 1 BM/day  -- Discuss Ozempic with inpatient pharmacy     # Orthostatic hypotension  -- Hold home amlodipine   -- Medicine recs: repeat orthostatics when well hydrated     # Gait instability - Essential tremor   -- Falls precautions  -- Vit B12 level wnl   -- PT recs: rolling walker after ECT, f/u outpt 3x/week   -- Medicine recs: consider outpt movement specialist workup  -- Repeat CMP      -- Metabolic Monitoring:   Not indicated    Social/Disposition:  -- Continue hospitalization at this time.   -- Primary team to follow up with family, outpatient resources    Patient was seen and plan of care was discussed with the Attending MD, Dr. Perrin Maltese, who agrees with the above statement and plan.     [name], MS3     I attest that I have reviewed the medical student note and that the components of the history of the present illness, the physical exam, and the assessment and plan documented were performed by me or were performed in my presence by the student where I verified the documentation and performed (or re-performed) the exam and medical decision making.   Zettie Pho, MD, PGY-2    I saw and evaluated the patient, participating in the key portions of the service. I reviewed the resident???s note. I agree with the resident???s findings and plan. Luellen Pucker, MD    Subjective:    Hours of sleep overnight :   8.75 hrs.     The treatment team, including resident and attending physicians, nursing staff, occupational/recreational therapy, and social work/case management, met to discuss the patient's progress and plan of care.    Per 07/24/21 RN Epic note:   Care plan reviewed.  Pt calm and cooperative during evening.  In dayroom with peers.  Denied SI/AVH/HI.  Compliant with evening medication.  Requested PRNs.  Pt returned to room.  No unsafe behaviors noted.  Continue to monitor.    MD interview:   Reports she was told by 3 people on the unit that she appeared 20% better today, which makes her believe she is doing better. Asked about increased pain in her hips after ECT. Encouraged patient to let ECT team know so they can adjust padding. Has also noticed having horrific dreams the last 3 nights. Has taken trazodone in the past without similar dreams. Has not tried hydroxyzine until her admission here. Continues to have word finding difficulties. Has dizziness and lightheadedness after ECT, usually resolves by lunch. Visual disturbances - described as floaters - are there but decreasing. Felt she had been triggered by conversations with husband about the reasons for her hospitalization.      Reviewed recommendations from the medicine team with the patient. She is having a BM every 2-3 days, is going to try lactulose today.     MS Interview: Patient's MMSE was 29 today.  Discussed previous SSRI's that patient has been on and patient states that Lexapro, Zoloft, citalopram were all very familiar  names but doesn't remember how they affected her.  Patient is open to starting Zoloft today.  Discussed side effects of headache, nausea, vomiting, GI upset, sexual dysfunction with patient.  Also discussed importance of using lactulose today and patient will see how many bowel movements she has with existing PRN with goal of one BM a day.  Dr. Hervey Ard came in and spoke about starting Ozempic again with patient and patient is open to starting it here.  Still a little concerned about cost.  Patient mentioned she gained 5 pounds while here.  Dr. Hervey Ard reiterated the lactulose use and we can possibly schedule it after lunch.      Discussed risks and benefits of zoloft, as well as potential alternatives. Patient expressed understanding and agreement with plan to start zoloft today.     Objective:    Vitals:  Patient Vitals for the past 12 hrs:   BP Temp Pulse Resp SpO2   07/24/21 0740 110/77 36.8 ??C (98.2 ??F) 83 18 99 %       Mental Status Exam:    Appearance:    Well nourished, Well developed, Clean/Neat and Appears older than stated age, ambulating well without walker   Motor:   No abnormal movements Speech/Language:    Normal rate, volume, tone, fluency   Mood:    Okay   Affect:   Dysthymic and Mood congruent   Thought process:   Logical, linear, clear, coherent, goal directed   Thought content:      Denies active SI   Perceptual disturbances:     Denies auditory and visual hallucinations, behavior not concerning for response to internal stimuli     Insight:     Fair   Judgment:    Fair   Impulse Control:   Fair   Other:         PE:   Gen: No acute distress, well developed and well nourished  Resp: Normal work of breathing  Neuro: no tics/tremors    Vital Signs:   Vitals:    07/24/21 0740   BP: 110/77   Pulse: 83   Resp: 18   Temp: 36.8 ??C (98.2 ??F)   SpO2: 99%       Test Results:  Data Review:   Lab results last 24 hours:    No results found for this or any previous visit (from the past 24 hour(s)).  Imaging: None    Psychometrics:  PHQ-9 PHQ-9 TOTAL SCORE   07/22/2021   8:00 PM 26   07/19/2021   7:23 PM 26   06/18/2021   5:00 PM 24   06/05/2021   9:00 AM 26   11/11/2019   8:00 PM 13         Social Determinants of Health with Concerns     Internet Connectivity: Not on file   Tobacco Use: Medium Risk    Smoking Tobacco Use: Former    Smokeless Tobacco Use: Never    Passive Exposure: Not on file   Substance Use: Not on file   Physical Activity: Not on file   Interpersonal Safety: Not on file   Stress: Not on file   Intimate Partner Violence: Not on file   Social Connections: Not on file        ---  Time-based billing disclaimer:  I personally spent 70   minutes face-to-face and non-face-to-face in the care of this patient, which includes all pre, intra, and post visit time on the date of service.  All documented time was specific to the E/M visit and does not include any procedures that may have been performed.

## 2021-07-24 NOTE — Unmapped (Signed)
Internal Medicine Consult Service    Assessment and Recommendations:   Alexis Burns is a 56 y.o. female with a PMHx of HTN, HLD, obesity, NASH cirrhosis, major depression that presented to [facility] for evaluation and depression and plan for ECT. Pt was seen at the request of Alexis Gulling, MD (Psychiatry (PSY)) for orthostatic hypotension, gait instability, worsening memory.    # Orthostatic Hypotension  Noted to experience mild lightheadedness and imbalance in the mornings that typically spontaneously resolve. Orthostatic vitals recorded in Vitals tab demonstrate BP 114/75 to 85/66 on 5/21PM, though patient body orientation not documented and thus difficult to interpret. Flowsheet values demonstrate only standing BP with systolics 80s-90s, possibly in setting of NPO for ECT. Most recent values today demonstrate 122/78 lying to 116/76 standing after 1 hour.  - Continue to hold home amlodipine  - encourage appropriate PO intake  - repeat orthostatics this afternoon  - if no improvement, plan to investigate alternative causes including medication-induced vs less likely dysautonomia.    # Memory Issues - Mild Cognitive Impairment?  Unclear subjective history of cognitive impairment for several months. Recently evaluated by Neurology 05/2021. At that time, patient was noted to exhibit non-amnestic, primarily attention/concentration and dysexecutive impairment. Imaging without clear evidence of neurodegeneration, cerebrovascular disease, or ischemic injury as significant contributors. Given lack of acute changes in cognition, would agree that most likely drivers include known severe depression potentially confounded by ongoing benzodiazepine use. Hepatic encephalopathy lower on differential given nml LFTs, BUN, and ammonia levels, lack of asterixis on exam. MMSE 25 this admission.   - continue treatment for major depression per Psych team  - clonazepam last filled 07/2021   - recommend discontinuation of clonazepam   - consider scheduled non-benzodiazepine anxiolytic (e.g. Atarax) rather than prn  - recommend ongoing outpatient longitudinal Neurology follow up   - continue rifaximin, titrate lactulose to goal 1 BM/day    # Gait instability - Essential tremor  Unclear history of current gait instability. Documented timeline suggests gait was assessed as nml as of 07/19/21, but unstable 07/20/21 with pt report that they ambulate by hugging walls.  L foot strain 09/2015 requiring temporary CAM walker. R ankle injury 12/2018 temporarily requiring CAM walker. Per primary team, gait currently steady with walker. On interview with Internal Medicine team, patient affirms that gait is transiently unsteady following ECT, and otherwise only abnormal in setting of anxiety-related tremor. Also affirms Hx progressive essential tremor and FHx Parkinsons. Ongoing depression, anxiety, benzodiazepine use, and intermittent hypotension likely contributors - recommendations as above.   - recommendations as above  - Consider discussing with outpatient Neurologist regarding potential indication for evaluation by Movement specialist in outpatient setting given progressive essential tremor and FHx Parkinsons.    For questions between 7:30AM-5PM, please page the Medicine Consult Service pager at [phone]. After 5PM, the Medicine Consult Service is covered by the [facility] On-Call Resident for urgent/emergent questions or concerns.     Reason for Consultation:   Pt was seen at the request of Alexis Gulling, MD (Psychiatry (PSY)) in consultation for orthostatic hypotension, gait instability, worsening memory.    Subjective:   Interval updates:    Patient continues with some dizziness which cannot be further distinguished by patient between vertigo vs. Lightheadedness. She denies any concern regarding falling at this time as the symptoms are not that severe. Patient reports 1 bowel movement ain last 24 hours. Still feeling some mental fog.    Allergies:  Haldol [haloperidol lactate], Cyclosporine, Tetracycline, and  Tramadol    Medications:   Prior to Admission medications    Medication Dose, Route, Frequency   acetaminophen (TYLENOL) 500 MG tablet 1,000 mg, Oral, Every 6 hours PRN   albuterol HFA 90 mcg/actuation inhaler 2 puffs, Inhalation, Every 6 hours PRN   amLODIPine (NORVASC) 10 MG tablet 10 mg, Oral, Daily (standard), For high blood pressure   clonazePAM (KLONOPIN) 0.5 MG tablet 0.5 mg, Oral, 3 times a day PRN   desvenlafaxine succinate (PRISTIQ) 100 MG 24 hr tablet 100 mg, Oral, Daily (standard)   ibuprofen (ADVIL,MOTRIN) 200 MG tablet 200 mg, Every 6 hours PRN  Patient not taking: Reported on 07/10/2021   lactulose 10 gram/15 mL solution 20 g, Oral, 3 times a day (standard)   lactulose 10 gram/15 mL solution 20 g, Oral, 3 times a day (standard)   MAGNESIUM ORAL 30 mg, 2 times a day (standard)  Patient not taking: Reported on 07/10/2021   nystatin (MYCOSTATIN) 100,000 unit/gram powder Apply to affected area 3 times daily  Patient not taking: Reported on 07/10/2021   potassium chloride 20 MEQ CR tablet 20 mEq, 2 times a day (standard)  Patient not taking: Reported on 07/10/2021   XIFAXAN 550 mg Tab 550 mg, Oral, 2 times a day (standard)   SUMAtriptan (IMITREX) 50 MG tablet 50 mg, Oral, Every 2 hours PRN   traZODone (DESYREL) 50 MG tablet TAKE 1 TABLET NIGHTLY AS NEEDED FOR SLEEP, AND TAKE ONE-HALF (1/2) TABLET UP TO TWICE A DAY AS NEEDED FOR ANXIETY  Patient taking differently: 0.5 tablets (25 mg total) nightly. Taking 1/2 tab at night   triamcinolone (KENALOG) 0.1 % ointment 1 application., Topical, As needed (once a day), Apply sparingly as needed to affected areas of body for itching        Medical History:  Past Medical History:   Diagnosis Date    ADD (attention deficit disorder)     Anxiety Since childhood    Arthritis 10 years    Asthma     cough variant asthma per pt report    Binge eating disorder     Cirrhosis (CMS-HCC)     Depression 04/11/2016    Hyperlipidemia 20 years    Not interested in meds    Hypertension     Obesity Since childhood       Surgical History:  Past Surgical History:   Procedure Laterality Date    BREAST BIOPSY Left     many biopsies    BREAST CYST ASPIRATION Right 2003    abcess    BREAST EXCISIONAL BIOPSY Left 2014ish    CESAREAN SECTION      CHOLECYSTECTOMY      HERNIA REPAIR  2020    HYSTERECTOMY  2001    partial    OOPHORECTOMY      still has one ovary    PR COLONOSCOPY W/BIOPSY SINGLE/MULTIPLE N/A 04/19/2020    Procedure: COLONOSCOPY, FLEXIBLE, PROXIMAL TO SPLENIC FLEXURE; WITH BIOPSY, SINGLE OR MULTIPLE;  Surgeon: Luanne Bras, MD;  Location: [facility];  Service: Gastroenterology    PR REPAIR INCISIONAL HERNIA,STRANG Midline 09/25/2018    Procedure: REPAIR INITIAL INCISIONAL OR VENTRAL HERNIA; INCARCERATED OR STRANGULATED;  Surgeon: Colon Branch, MD;  [facility];  Service: General Surgery    PR SHLDR ARTHROSCOP,SURG,W/ROTAT CUFF REPR Left 09/25/2020    Procedure: ARTHROSCOPY, SHOULDER, SURGICAL; WITH ROTATOR CUFF REPAIR;  Surgeon: Tomasa Rand, MD;  Location: [facility];  Service: Orthopedics  PR UPPER GI ENDOSCOPY,DIAGNOSIS N/A 04/19/2020    Procedure: UGI ENDO, INCLUDE ESOPHAGUS, STOMACH, & DUODENUM &/OR JEJUNUM; DX W/WO COLLECTION SPECIMN, BY BRUSH OR WASH;  Surgeon: Luanne Bras, MD;  Location: [facility];  Service: Gastroenterology    WISDOM TOOTH EXTRACTION         Social History:    Social History     Tobacco Use    Smoking status: Former     Packs/day: 1.00     Years: 15.00     Pack years: 15.00     Types: Cigarettes     Quit date: 04/25/1994     Years since quitting: 27.2    Smokeless tobacco: Never   Vaping Use    Vaping Use: Former   Substance Use Topics    Alcohol use: Not Currently    Drug use: Never        Family History:  Family History   Problem Relation Age of Onset    Lymphoma Mother     Diabetes Mother Hypertension Mother     Skin cancer Mother     Arthritis Mother     Cancer Mother         lymph    Kidney disease Mother     Vision loss Mother         Macular D.    Parkinsonism Father     Hypertension Father     Colon cancer Father 75    Macular degeneration Father     Alcohol abuse Father     Cancer Father         colon    Depression Father     Vision loss Father         Macular D.    Parkinsonism Sister     Tremor Sister         essential tremor, hands and voice    Seizures Sister     Migraines Sister     Macular degeneration Sister     Miscarriages / [place] Sister     Vision loss Sister         Macular d    Skin cancer Sister     Tremor Sister         no diagnosis    Migraines Sister     Macular degeneration Sister     Vision loss Sister         Macular d    Skin cancer Maternal Grandmother     Stroke Maternal Grandmother     Alcohol abuse Paternal Grandmother     Heart disease Paternal Grandfather     Cancer Son         Spinal cancer, leaving pt disabled    Diabetes Son     Mental illness Son     Neuropathy Son     Depression Son     Drug abuse Son     Hypertension Son     Kidney disease Son     Vision loss Son         Diabetes    Mental illness Son     Intellectual Disability Son     Learning disabilities Son        Review of Systems:  10 systems were reviewed and are negative unless otherwise mentioned in the HPI    Objective:   Physical Exam:  Temp:  [36.6 ??C (97.9 ??F)-36.8 ??C (98.2 ??F)] 36.8 ??C (98.2 ??F)  Heart Rate:  [83-86]  83  Resp:  [18-20] 18  BP: (110-122)/(77-82) 110/77  SpO2:  [98 %-99 %] 99 %, No intake or output data in the 24 hours ending 07/24/21 1534      Gen: Chronically ill-appearing female in NAD  Eyes: Sclera anicteric, EOMI grossly  HENT: atraumatic, normocephalic, MMM  Heart: RRR, S1, S2, no M/R/G  Lungs: CTAB, no crackles or wheezes  Extremities: no LE edema  Neuro: A&Ox4. Bilateral UE resting tremor; no asterixis   Skin:  No rashes, lesions on clothed exam  Psych: Depressed mood, though answers questions appropriately    Labs/Studies:  Labs and Studies from the last 24hrs per EMR and Reviewed    Imaging: Radiology studies were personally reviewed

## 2021-07-24 NOTE — Unmapped (Signed)
Care plan reviewed.  Pt calm and cooperative during evening.  In dayroom with peers.  Denied SI/AVH/HI.  Compliant with evening medication.  Requested PRNs.  Pt returned to room.  No unsafe behaviors noted.  Continue to monitor.

## 2021-07-24 NOTE — Unmapped (Signed)
07/24/21 1310   Activity/Group Checklist   Tx Duration 30 Min.   Group Psychology   Note Type Progress Note   Interventions Evidence-Based Treatment informed by Dialectical Behavior Therapy (DBT)   Attendance Attended   Follows Direction Followed directions   Interactions Interacted appropriately; Pleasant   Mood Mildly Dysthymic   Affect Congruent with Mood    Equipment/Environment   Equipment / Environment Patient wearing mask for full session   Group Leader Observations   Group Leader Observations Alexis Burns (she/her) arrived late to today's Psychology Group after meeting with her Treatment Team.      Today's group was focused on Anxiety.  Writer provided psychoeducation on anxiety including the roles of the Sympathetic and Parasympathetic Nervous systems and how they relate to Anxiety.  Group members identified physiological changes associated with these systems.      Writer then offered an introduction to the States of Mind from [employer].  Group members shared how their increases and decreases in anxiety change depending on their state of mind.      Alexis Burns was present when Clinical research associate introduced the [employer] IN - Mind OUT mindfulness activity.  She engaged in activity and described feeling a lot calmer afterward.  She expressed gratitude for group.  Group members were given Mindfulness Handout 3A with additional mindfulness exercises for independent practice.

## 2021-07-24 NOTE — Unmapped (Signed)
Patient started off morning with ECT and afterwards interchanged with being out in dayroom as well as taking naps.  Patient did request prn todays which were effective.  Patient denies any and all SI/HI/AH/VH.

## 2021-07-25 MED ADMIN — traZODone (DESYREL) tablet 25 mg: 25 mg | ORAL

## 2021-07-25 MED ADMIN — lactulose (CEPHULAC) packet 10 g: 10 g | ORAL | @ 17:00:00

## 2021-07-25 MED ADMIN — rifAXIMin (XIFAXAN) tablet 550 mg: 550 mg | ORAL | Stop: 2021-08-18

## 2021-07-25 MED ADMIN — ondansetron (ZOFRAN) injection: INTRAVENOUS | @ 14:00:00 | Stop: 2021-07-25

## 2021-07-25 MED ADMIN — sodium chloride (NS) 0.9 % infusion: INTRAVENOUS | @ 15:00:00 | Stop: 2021-07-25

## 2021-07-25 MED ADMIN — labetaloL (NORMODYNE,TRANDATE) injection: INTRAVENOUS | @ 15:00:00 | Stop: 2021-07-25

## 2021-07-25 MED ADMIN — caffeine citrate (CAFCIT) injection: INTRAVENOUS | @ 15:00:00 | Stop: 2021-07-25

## 2021-07-25 MED ADMIN — lactated ringers bolus 1,000 mL: 1000 mL | INTRAVENOUS | @ 19:00:00 | Stop: 2021-07-25

## 2021-07-25 MED ADMIN — succinylcholine (ANECTINE) injection: INTRAVENOUS | @ 15:00:00 | Stop: 2021-07-25

## 2021-07-25 MED ADMIN — propofoL (DIPRIVAN) injection: INTRAVENOUS | @ 15:00:00 | Stop: 2021-07-25

## 2021-07-25 MED ADMIN — acetaminophen (TYLENOL) tablet 650 mg: 650 mg | ORAL

## 2021-07-25 NOTE — Unmapped (Signed)
Care plan reviewed.  Pt calm and cooperative during the evening.  Pt talkative with peers in dayroom.  Denied SI/AVH/HI.  Compliant with medication.  Requested PRN Trazodone and Tylenol for L shoulder pain.  NPO after midnight r.t ECT.  No unsafe behaviors noted.  Continue to monitor.

## 2021-07-25 NOTE — Unmapped (Signed)
VENOUS ACCESS TEAM PROCEDURE    Order was placed for a PIV by Venous Access Team (VAT).  Patient was assessed at bedside for placement of a PIV. PPE were donned per protocol.  Access was obtained. Blood return noted.  Dressing intact and device well secured.  Flushed with normal saline.  See LDA for details.  Pt advised to inform RN of any s/s of discomfort at the PIV site.    Workup / Procedure Time:  30 minutes       care RN was notified.       Thank you,     Franchot Gallo RN Venous Access Team

## 2021-07-25 NOTE — Unmapped (Signed)
Patient is on Q 15 minutes checks. She was calm and cooperative, on NPO for ECT. She went for ECT this morning and came back on the unit in a satisfactory condition. She took her lunch and scheduled medication. She was visible in the dayroom interacting well with staff and peers and denies SI/HI and A/VH. Patient to get Ringers lactate 1,000 mls, PIC line inseted and waiting for the RL solution to be availed. Patient encouraged to take oral fluids. No behavior concerns at this time and nursing staff will continue to monitor and support.  Problem: Adult Behavioral Health Plan of Care  Goal: Plan of Care Review  Outcome: Ongoing - Unchanged  Goal: Patient-Specific Goal (Individualization)  Outcome: Ongoing - Unchanged  Goal: Adheres to Safety Considerations for Self and Others  Outcome: Ongoing - Unchanged  Goal: Absence of New-Onset Illness or Injury  Outcome: Ongoing - Unchanged  Goal: Optimized Coping Skills in Response to Life Stressors  Outcome: Ongoing - Unchanged  Goal: Develops/Participates in Therapeutic Alliance to Support Successful Transition  Outcome: Ongoing - Unchanged  Goal: Rounds/Family Conference  Outcome: Ongoing - Unchanged     Problem: Fall Injury Risk  Goal: Absence of Fall and Fall-Related Injury  Outcome: Ongoing - Unchanged     Problem: Suicide Risk  Goal: Absence of Self-Harm  Outcome: Ongoing - Unchanged  Intervention: Assess Risk to Self and Maintain Safety  Recent Flowsheet Documentation  Taken 07/25/2021 0800 by Bennett Scrape, RN  Behavior Management: behavioral plan developed     Problem: Asthma Comorbidity  Goal: Maintenance of Asthma Control  Outcome: Ongoing - Unchanged     Problem: Behavioral Health Comorbidity  Goal: Maintenance of Behavioral Health Symptom Control  Outcome: Ongoing - Unchanged     Problem: Hypertension Comorbidity  Goal: Blood Pressure in Desired Range  Outcome: Ongoing - Unchanged

## 2021-07-25 NOTE — Unmapped (Signed)
Psychiatry      Daily Progress Note      Admit date/time: 07/19/2021  5:08 PM   LOS: 6 days      Assessment:  Alexis Burns is a 56 y.o., White race, Not Hispanic, Latino/a, or Spanish origin ethnicity,  ENGLISH speaking female with a history of Major depressive disorder, multiple suicide attempts, who presents for evaluation of Severe depression, plan for ECT.     Alexis Burns is an [facility] patient since 2019 with a history of depression and anxiety, with past hospitalizations in 2017 at [facility] and admission to crisis unit in 2021 for suicidal ideation. Patient presents as a direct admission from clinic after discussion about initiation of index series of ECT.  She describes lifelong mood symptoms that have acutely worsened in the last year in the setting of worsening health.  Follows with ADT clinic.  Patient has had significant depressed mood, cognitive delays, tearfulness, decreased appetite, poor sleep, passive suicidal ideation which has been managed in the outpatient setting with extensive safety planning.  Patient has noted that these symptoms have lasted for 8 months, however, she does not recall the last time that she was happy.  From patient's husband, there have been times where patient has been happy and appears that life stressors will push patient's into a depressive episode.  Patient's symptoms consistent with major depressive disorder.       Patient has long history of mood symptoms dating back to her 63s, she has been established w/ [facility] since 2019. September 2021 she was admitted to the [facility] for suicidal ideation with a plan to shoot herself. Per outpatient documentation: She has had multiple previous med trials including: past medication trials include Prozac (stopped working), risperidone (brief trial), lithium (intolerable side effects), buspirone (unhelpful at 20mg  TID), duloxetine (90mg  well-tolerated but not helpful on its own), aripiprazole (as adjunct for duloxetine, eventually stopped due to tremors), Wellbutrin (anxiety, dry mouth), Seroquel (ineffective), Cytomel (dry mouth, joint pain).  Most recently, she is taking Pristiq 100 mg and Klonopin PRN for anxiety.  She is on the ECT schedule for MWF.      At this time, we will initiate sertraline  to target her depressive symptoms in addition to ECT. Given her liver disease, will not titrate above 100mg  daily. MMSE on 5/23 was 29/30 (point lost for memory). Patient received 1L fluids while at ECT given her hypotension this morning.     As of 07/25/2021, continued hospitalization is warranted given severe major depressive symptom burden, suicidal ideation, continued treatment with ECT.      Diagnoses:   Active Problems:    Severe episode of recurrent major depressive disorder, 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)    Plan:  Safety:  -- Continue admission to inpatient psychiatric unit for safety, stabilization, and treatment.   -- Admission status: Voluntary  -- On unit/off unit level of supervision: q15 min checks / Restrict to Unit  -- Medical equipment: The treatment team has determined that this patient requires the continued use of medical equipment  walker for their care and healing. Specific medical rationale for need for equipment: impaired mobility and fall risk,    Psychiatry:  # Major Depressive Disorder vs Persistent depressive disorder   -- Continue ECT MWF    -- Start Sertraline 25 mg daily (s5/24)  -- s/p Effexor XR (disc 5/23) 75 mg due to SE's  -- MMSE score was 25, BDI score was 51; repeat MMSE 07/24/21 was 29  --  MMSE weekly on Tues/Thurs, BDI on Thurs  -- TSH wnl     # Anxiety  -- Stop home klonopin (0.5 mg in AM, lunch and 1 mg at bedtime)  -- HOLD hydroxyzine 25 mg PRN for anxiety    # Insomnia  -- Trazodone 25 mg at bedtime PRN for sleep     Medical:  # 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   -- Primary team to follow up with family, outpatient resources    Patient was seen and plan of care was discussed with the Attending MD, Dr. Perrin Maltese, who agrees with the above statement and plan.     [name], MS3       I evaluated this patient with [name], MS3. I attest that I have reviewed the student note and that the components of the history of the present illness, the physical exam, and the assessment and plan documented were performed by me or were performed in my presence by the student where I verified the documentation and performed (or re-performed) the exam and medical decision making.     Zettie Pho, MD, PGY-2    Subjective:    Hours of sleep overnight :   7.25 hrs.     The treatment team, including resident and attending physicians, nursing staff, occupational/recreational therapy, and social work/case management, met to discuss the patient's progress and plan of care.    Per 07/25/21 RN Epic note:   Care plan reviewed.  Pt calm and cooperative during the evening.  Pt talkative with peers in dayroom.  Denied SI/AVH/HI.  Compliant with medication.  Requested PRN Trazodone and Tylenol for L shoulder pain.  NPO after midnight r.t ECT.  No unsafe behaviors noted.  Continue to monitor.    MD interview:   Using walker during interview this morning - states in the mornings, I'm lightheaded. Worse this morning. Reports feeling as if she would black out this upon standing. Has not eaten or drank since 8 PM since NPO for ECT this morning. Did not have nightmares last night. Anxiety has been tolerable since being here. Had one notable episode of anxiety after overhearing a peer talk about hunting, used hydroxyzine at that time and fell asleep, which was helpful. Described mood as melancholy. Reviewed plan to start zoloft. Had 4 BMs after lactulose in the afternoon, would like to try a lower dose.       Objective:    Vitals:  Patient Vitals for the past 12 hrs:   BP Temp Temp src Pulse Resp SpO2   07/25/21 1127 111/87 36.8 ??C (98.2 ??F) Temporal 67 12 99 %   07/25/21 1124 124/77 36.8 ??C (98.2 ??F) Temporal 70 17 99 %   07/25/21 1121 124/83 36.9 ??C (98.4 ??F) Temporal 69 14 99 %   07/25/21 1118 118/90  36.9 ??C (98.4 ??F) Temporal 77 12 97 %   07/25/21 1115 121/81 36.8 ??C (98.2 ??F) Temporal 73 14 95 %   07/25/21 0756 86/69 -- -- 77 -- 100 %   07/25/21 0754 106/75 -- -- 66 -- 100 %   07/25/21 0752 119/76 36.9 ??C (98.4 ??F) -- 65 18 99 %       Mental Status Exam:    Appearance:    Well nourished, Well developed, Clean/Neat and Appears older than stated age, ambulating well with walker   Motor:   No abnormal movements   Speech/Language:    Normal rate, volume, tone, fluency   Mood:    melancholy   Affect:   Dysthymic and Mood congruent   Thought process:   Logical, linear, clear, coherent, goal directed   Thought content:      Denies active SI   Perceptual disturbances:     Denies auditory and visual hallucinations, behavior not concerning for response to internal stimuli     Insight:     Fair   Judgment:    Fair   Impulse Control:   Fair   Other:         PE:   Gen: No acute distress, well developed and well nourished  Resp: Normal work of breathing  Neuro: no tics/tremors    Vital Signs:   Vitals:    07/25/21 1127   BP: 111/87   Pulse: 67   Resp: 12   Temp: 36.8 ??C (98.2 ??F)   SpO2: 99%       Test Results:  Data Review:   Lab results last 24 hours:    No results found for this or any previous visit (from the past 24 hour(s)).  Imaging: None    Psychometrics:  PHQ-9 PHQ-9 TOTAL SCORE 07/22/2021   8:00 PM 26   07/19/2021   7:23 PM 26   06/18/2021   5:00 PM 24   06/05/2021   9:00 AM 26   11/11/2019   8:00 PM 13         Social Determinants of Health with Concerns     Internet Connectivity: Not on file   Tobacco Use: Medium Risk    Smoking Tobacco Use: Former    Smokeless Tobacco Use: Never    Passive Exposure: Not on file   Substance Use: Not on file   Physical Activity: Not on file   Interpersonal Safety: Not on file   Stress: Not on file   Intimate Partner Violence: Not on file   Social Connections: Not on file        ---  Time-based billing disclaimer:  I personally spent 40   minutes face-to-face and non-face-to-face in the care of this patient, which includes all pre, intra, and post visit time on the date of service.  All documented time was specific to the E/M visit and does not include any procedures that may have been performed.

## 2021-07-25 NOTE — Unmapped (Signed)
[facility] E[facility]               Procedure Note                                             Requesting Attending Physician: Bryn Gulling, MD  Admit Date: 07/19/2021     Service Type: Inpatient  Requesting Attending Physician: Bryn Gulling, MD  Service requesting consult: Psychiatry  Consulting service: Psychiatry  Admit Date: 07/19/2021  Service Date: Jul 25, 2021      Time out was taken with staff to confirm correct patient and correct procedure to be performed.    INDICATION FOR ECT: Mood Disorder: Maj Depress D/O, REC  Severe WITHOUT psychotic behavior      TREATMENT HISTORY:    Total Number of Treatments: 3  Current Treatment #: 3    Treatment Type: Index    Electrode Placement: Left Frontal Right Temporal      RATING SCALES:    CGI-Change score for ECT series:   CGI-C: 4. No change    Beck Depression Inventory Total Score:   51    Mini-Mental Status ExamTotal Score: 29 25     Bush-Francis CatatoniaTotal Score:       MEDICAL INFORMATION:    Allergies:  Haldol [haloperidol lactate], Cyclosporine, Tetracycline, and Tramadol    Medical History:  See ECT Consult    Surgical History:  See ECT Consult    VITAL SIGNS:   127/70  64    Post-procedure:  Labetalol 5 mg given and then monitored in PACU      THYMATRON:  ECT # Thymatron Settings (%) Modification Seizure   Length Cuff Duration  (sec) EEG Duration  (sec) Complications Interventions   1 70 Good Adequate 44 48 HTN  Labetalol 5 mg                                               Medications given during procedure:   Current Facility-Administered Medications   Medication Dose Route Frequency Provider Last Rate Last Admin    acetaminophen (TYLENOL) tablet 650 mg  650 mg Oral Q6H PRN Estelle Grumbles, MD   650 mg at 07/24/21 2022    aluminum-magnesium hydroxide-simethicone (MAALOX MAX) 80-80-8 mg/mL oral suspension  30 mL Oral Q6H PRN Estelle Grumbles, MD   30 mL at 07/24/21 1538    lactulose (CEPHULAC) packet 10 g  10 g Oral Daily PRN Latrelle Dodrill, MD        lactulose (CEPHULAC) packet 10 g  10 g Oral Daily Latrelle Dodrill, MD        lidocaine (LIDODERM) 5 % patch 2 patch  2 patch Transdermal Daily Azzie Roup, MD   2 patch at 07/22/21 2033    rifAXIMin (XIFAXAN) tablet 550 mg  550 mg Oral BID Estelle Grumbles, MD   550 mg at 07/24/21 2023    sertraline (ZOLOFT) tablet 25 mg  25 mg Oral Daily Latrelle Dodrill, MD        traZODone (DESYREL) tablet 25 mg  25 mg Oral Nightly PRN Marthann Schiller, MD   25 mg at 07/24/21 2022        Medication during hospitalization  acetaminophen (TYLENOL) tablet 650 mg Admin Date  07/19/2021  20:55 Action  Given Dose  650 mg Route  Oral Site          acetaminophen (TYLENOL) tablet 650 mg Admin Date  07/20/2021  11:55 Action  Given Dose  650 mg Route  Oral Site          acetaminophen (TYLENOL) tablet 650 mg Admin Date  07/20/2021  21:08 Action  Given Dose  650 mg Route  Oral Site          acetaminophen (TYLENOL) tablet 650 mg Admin Date  07/21/2021  09:23 Action  Given Dose  650 mg Route  Oral Site          acetaminophen (TYLENOL) tablet 650 mg Admin Date  07/21/2021  20:53 Action  Given Dose  650 mg Route  Oral Site          acetaminophen (TYLENOL) tablet 650 mg Admin Date  07/22/2021  08:18 Action  Given Dose  650 mg Route  Oral Site          acetaminophen (TYLENOL) tablet 650 mg Admin Date  07/22/2021  18:51 Action  Given Dose  650 mg Route  Oral Site          acetaminophen (TYLENOL) tablet 650 mg Admin Date  07/23/2021  10:26 Action  Given Dose  650 mg Route  Oral Site          acetaminophen (TYLENOL) tablet 650 mg Admin Date  07/23/2021  20:36 Action  Given Dose  650 mg Route  Oral Site          acetaminophen (TYLENOL) tablet 650 mg Admin Date  07/24/2021  20:22 Action  Given Dose  650 mg Route  Oral Site          aluminum-magnesium hydroxide-simethicone (MAALOX MAX) 80-80-8 mg/mL oral suspension Admin Date  07/19/2021  20:54 Action  Given Dose  30 mL Route  Oral Site aluminum-magnesium hydroxide-simethicone (MAALOX MAX) 80-80-8 mg/mL oral suspension Admin Date  07/20/2021  21:08 Action  Given Dose  30 mL Route  Oral Site          aluminum-magnesium hydroxide-simethicone (MAALOX MAX) 80-80-8 mg/mL oral suspension Admin Date  07/22/2021  22:19 Action  Given Dose  30 mL Route  Oral Site      Comments: heartburn        aluminum-magnesium hydroxide-simethicone (MAALOX MAX) 80-80-8 mg/mL oral suspension Admin Date  07/23/2021  20:36 Action  Given Dose  30 mL Route  Oral Site      Comments: heart burn        aluminum-magnesium hydroxide-simethicone (MAALOX MAX) 80-80-8 mg/mL oral suspension Admin Date  07/24/2021  15:38 Action  Given Dose  30 mL Route  Oral Site          hydrOXYzine (ATARAX) tablet 25 mg Admin Date  07/19/2021  20:54 Action  Given Dose  25 mg Route  Oral Site          hydrOXYzine (ATARAX) tablet 25 mg Admin Date  07/20/2021  13:56 Action  Given Dose  25 mg Route  Oral Site          hydrOXYzine (ATARAX) tablet 25 mg Admin Date  07/20/2021  21:08 Action  Given Dose  25 mg Route  Oral Site          hydrOXYzine (ATARAX) tablet 25 mg Admin Date  07/21/2021  20:27 Action  Given Dose  25 mg Route  Oral Site  Comments: anxiety        hydrOXYzine (ATARAX) tablet 25 mg Admin Date  07/22/2021  14:18 Action  Given Dose  25 mg Route  Oral Site          hydrOXYzine (ATARAX) tablet 25 mg Admin Date  07/22/2021  20:33 Action  Given Dose  25 mg Route  Oral Site      Comments: anxiety        hydrOXYzine (ATARAX) tablet 25 mg Admin Date  07/23/2021  12:35 Action  Given Dose  25 mg Route  Oral Site          hydrOXYzine (ATARAX) tablet 25 mg Admin Date  07/23/2021  20:36 Action  Given Dose  25 mg Route  Oral Site      Comments: anxiety        lactulose (CEPHULAC) packet 20 g Admin Date  07/24/2021  15:38 Action  Given Dose  20 g Route  Oral Site          lidocaine (LIDODERM) 5 % patch 2 patch Admin Date  07/21/2021  20:53 Action  Patch Applied Dose  2 patch Route  Transdermal Site  Other    Comments: L shoulder, bilat lower back        lidocaine (LIDODERM) 5 % patch 2 patch Admin Date  07/22/2021  20:33 Action  Patch Applied Dose  2 patch Route  Transdermal Site  Other    Comments: shoulder back        rifAXIMin (XIFAXAN) tablet 550 mg Admin Date  07/19/2021  20:52 Action  Given Dose  550 mg Route  Oral Site          rifAXIMin (XIFAXAN) tablet 550 mg Admin Date  07/20/2021  12:55 Action  Given Dose  550 mg Route  Oral Site      Comments: Patient was in ECT        rifAXIMin (XIFAXAN) tablet 550 mg Admin Date  07/20/2021  21:07 Action  Given Dose  550 mg Route  Oral Site          rifAXIMin (XIFAXAN) tablet 550 mg Admin Date  07/21/2021  09:22 Action  Given Dose  550 mg Route  Oral Site          rifAXIMin (XIFAXAN) tablet 550 mg Admin Date  07/21/2021  20:27 Action  Given Dose  550 mg Route  Oral Site          rifAXIMin (XIFAXAN) tablet 550 mg Admin Date  07/22/2021  08:21 Action  Given Dose  550 mg Route  Oral Site          rifAXIMin (XIFAXAN) tablet 550 mg Admin Date  07/22/2021  20:33 Action  Given Dose  550 mg Route  Oral Site          rifAXIMin (XIFAXAN) tablet 550 mg Admin Date  07/23/2021  10:16 Action  Given Dose  550 mg Route  Oral Site          rifAXIMin (XIFAXAN) tablet 550 mg Admin Date  07/23/2021  20:36 Action  Given Dose  550 mg Route  Oral Site          rifAXIMin (XIFAXAN) tablet 550 mg Admin Date  07/24/2021  08:43 Action  Given Dose  550 mg Route  Oral Site          rifAXIMin (XIFAXAN) tablet 550 mg Admin Date  07/24/2021  20:23 Action  Given Dose  550 mg Route  Oral Site  traZODone (DESYREL) tablet 25 mg Admin Date  07/22/2021  22:18 Action  Given Dose  25 mg Route  Oral Site      Comments: sleep        traZODone (DESYREL) tablet 25 mg Admin Date  07/23/2021  20:40 Action  Given Dose  25 mg Route  Oral Site      Comments: sleep        traZODone (DESYREL) tablet 25 mg Admin Date  07/24/2021  20:22 Action  Given Dose  25 mg Route  Oral Site      Comments: sleep traZODone (DESYREL) tablet 50 mg Admin Date  07/19/2021  20:54 Action  Given Dose  50 mg Route  Oral Site          traZODone (DESYREL) tablet 50 mg Admin Date  07/20/2021  21:08 Action  Given Dose  50 mg Route  Oral Site          traZODone (DESYREL) tablet 50 mg Admin Date  07/21/2021  20:28 Action  Given Dose  50 mg Route  Oral Site      Comments: sleep        venlafaxine (EFFEXOR-XR) 24 hr capsule 75 mg Admin Date  07/20/2021  16:49 Action  Given Dose  75 mg Route  Oral Site      Comments: Medication not available at scheduled time.        venlafaxine (EFFEXOR-XR) 24 hr capsule 75 mg Admin Date  07/21/2021  09:21 Action  Given Dose  75 mg Route  Oral Site      Comments: medication was not available.        venlafaxine (EFFEXOR-XR) 24 hr capsule 75 mg Admin Date  07/22/2021  08:21 Action  Given Dose  75 mg Route  Oral Site          venlafaxine (EFFEXOR-XR) 24 hr capsule 75 mg Admin Date  07/23/2021  10:16 Action  Given Dose  75 mg Route  Oral Site              Neck Collar Used:  Yes    PROGRESS NOTE:     Alexis Burns is a 56 y.o. who presents for initiation of index series of ECT as an inpatient.    She states she feels tired and hurting. She did not get a good night of sleep last night due to a lot of noise on the unit, so that is why she feels tired. Her soreness has been worse since last treatment, mainly the tailbone. Informed her that is likely due to the bed and that primary team may be able to order an egg crate mattress for her. Pt expressed understanding. She also has been having headaches post-ECT, which is not helped too much by Tylenol. She also endorses some word-finding difficulties that started this morning. Her mood today is melancholy - she is still labile. Denies SI.    Mental Status Exam:  Appearance:    Well nourished and Well developed   Behavior:  Calm and Cooperative   Motor:   No abnormal movements   Speech/Language:    Normal rate, volume, tone, fluency and Language intact, well formed   Mood:   Melancholy    Affect:   Constricted, Depressed, and Mood congruent; somewhat labile    Thought process:   Logical, linear, clear, coherent, goal directed   Thought content:     No SI   Perceptual disturbances:     Denies auditory and visual hallucinations,  behavior not concerning for response to internal stimuli     Orientation:   Oriented to person, place, time, and general circumstances   Attention:   Able to fully attend without fluctuations in consciousness   Concentration:   Able to fully concentrate and attend   Memory:    MMSE 25/30    Fund of knowledge:    Consistent with level of education and development   Insight:     Fair   Judgment:    Fair   Impulse Control:   Fair     Physical Exam:   Gen: NAD  Pulm: No increased work of breathing  Neuro: No tremors, tics noted, no abnormal movements    A/P: Alexis Burns is a 56 y.o. who presents for initiation of index series of ECT as an inpatient for treatment of major depressive disorder, recurrent, severe, without psychotic features. Baseline BDI of 51 and MMSE of 25. Started index ECT today with LFRT electrode placement.     Addition of caffeine increased seizure length today but the majority (after the first 15 seconds) were lower-than-ideal amplitude Would consider switching to bilateral on treatment 5 if no improvement clinically.     Post-Treatment Complications: HTN; given labetalol 5 mg     Recommendations for Future Treatments:  as listed in book     Next Treatment Date: 5/26    Meds to take prior to ECT: None    Special Instructions: Do not take these medications after 6pm on the night before ECT: Klonopin/clonazepam (prescribed as an outpatient but not currently prescribed inpatient)    Assisted By: Thornell Sartorius, MD    Gabriel Cirri, MD

## 2021-07-26 MED ORDER — DESVENLAFAXINE SUCCINATE ER 100 MG TABLET,EXTENDED RELEASE 24 HR
ORAL_TABLET | Freq: Every day | ORAL | 2 refills | 30 days | Status: CP
Start: 2021-07-26 — End: 2021-10-24

## 2021-07-26 MED ADMIN — aluminum-magnesium hydroxide-simethicone (MAALOX MAX) 80-80-8 mg/mL oral suspension: 30 mL | ORAL | @ 02:00:00

## 2021-07-26 MED ADMIN — acetaminophen (TYLENOL) tablet 650 mg: 650 mg | ORAL

## 2021-07-26 MED ADMIN — sertraline (ZOLOFT) tablet 25 mg: 25 mg | ORAL | @ 12:00:00

## 2021-07-26 MED ADMIN — rifAXIMin (XIFAXAN) tablet 550 mg: 550 mg | ORAL | Stop: 2021-08-18

## 2021-07-26 MED ADMIN — traZODone (DESYREL) tablet 25 mg: 25 mg | ORAL

## 2021-07-26 MED ADMIN — rifAXIMin (XIFAXAN) tablet 550 mg: 550 mg | ORAL | @ 12:00:00 | Stop: 2021-08-18

## 2021-07-26 MED ADMIN — lidocaine (LIDODERM) 5 % patch 2 patch: 2 | TRANSDERMAL

## 2021-07-26 NOTE — Unmapped (Signed)
Psychiatry      Daily Progress Note      Admit date/time: 07/19/2021  5:08 PM   LOS: 7 days      Assessment:  Alexis Burns is a 56 y.o., White race, Not Hispanic, Latino/a, or Spanish origin ethnicity,  ENGLISH speaking female with a history of Major depressive disorder, multiple suicide attempts, who presents for evaluation of Severe depression, plan for ECT.     Alexis Burns is an [facility] patient since 2019 with a history of depression and anxiety, with past hospitalizations in 2017 at [facility] and admission to crisis unit in 2021 for suicidal ideation. Patient presents as a direct admission from clinic after discussion about initiation of index series of ECT.  She describes lifelong mood symptoms that have acutely worsened in the last year in the setting of worsening health.  Follows with [facility] clinic.  Patient has had significant depressed mood, cognitive delays, tearfulness, decreased appetite, poor sleep, passive suicidal ideation which has been managed in the outpatient setting with extensive safety planning.  Patient has noted that these symptoms have lasted for 8 months, however, she does not recall the last time that she was happy.  From patient's husband, there have been times where patient has been happy and appears that life stressors will push patient's into a depressive episode.  Patient's symptoms consistent with major depressive disorder.       Patient has long history of mood symptoms dating back to her 48s, she has been established w/ [facility] since 2019. September 2021 she was admitted to the [facility] for suicidal ideation with a plan to shoot herself. Per outpatient documentation: She has had multiple previous med trials including: past medication trials include Prozac (stopped working), risperidone (brief trial), lithium (intolerable side effects), buspirone (unhelpful at 20mg  TID), duloxetine (90mg  well-tolerated but not helpful on its own), aripiprazole (as adjunct for duloxetine, eventually stopped due to tremors), Wellbutrin (anxiety, dry mouth), Seroquel (ineffective), Cytomel (dry mouth, joint pain).  Most recently, she is taking Pristiq 100 mg and Klonopin PRN for anxiety.  She is on the ECT schedule for MWF.      At this time, we will initiate sertraline to target her depressive symptoms in addition to ECT. Given her liver disease, will not titrate above 100mg  daily. MMSE on 5/25 was 27/30 (3 point lost for memory), BDI on 5/25 was 30 (down from 51 on 07/20/21). Next ECT will be tomorrow 5/26.      As of 07/26/2021, continued hospitalization is warranted given severe major depressive symptom burden, suicidal ideation, continued treatment with ECT.      Diagnoses:   Active Problems:    Severe episode of recurrent major depressive disorder, 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)    Plan:  Safety:  -- Continue admission to inpatient psychiatric unit for safety, stabilization, and treatment.   -- Admission status: Voluntary  -- On unit/off unit level of supervision: q15 min checks / Restrict to Unit  -- Medical equipment: The treatment team has determined that this patient requires the continued use of medical equipment  walker for their care and healing. Specific medical rationale for need for equipment: impaired mobility and fall risk,    Psychiatry:  # Major Depressive Disorder vs Persistent depressive disorder   -- Continue ECT MWF    -- Start Sertraline 25 mg daily (s5/25)  -- s/p Effexor XR (disc 5/23) 75 mg due to SE's  -- 07/20/21 MMSE score was 25, 23, BDI score was 51;  repeat MMSE 07/26/21 was 27, BDI 07/26/21 was 30  -- MMSE weekly on Tues/Thurs, BDI on Thurs  -- TSH wnl     # Anxiety  -- Stop home klonopin (0.5 mg in AM, lunch and 1 mg at bedtime)  -- HOLD hydroxyzine 25 mg PRN for anxiety    # Insomnia  -- Trazodone 25 mg at bedtime PRN for sleep     Medical:  # 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   -- Primary team to follow up with family, outpatient resources    Patient was seen and plan of care was discussed with the Attending MD, Dr. Perrin Maltese, who agrees with the above statement and plan.     [name], MS3     I evaluated this patient with [name], MS3. I attest that I have reviewed the student note and that the components of the history of the present illness, the physical exam, and the assessment and plan documented were performed by me or were performed in my presence by the student where I verified the documentation and performed (or re-performed) the exam and medical decision making.     Zettie Pho, MD, PGY-2    Subjective:    Hours of sleep overnight :   8.5 hrs.     The treatment team, including resident and attending physicians, nursing staff, occupational/recreational therapy, and social work/case management, met to discuss the patient's progress and plan of care.    Per 07/26/21 RN Epic note:   Patient is alert, oriented x 4, pleasant and cooperative. Denies SI /HI and AVH.  Visible in the milieu, social with peers. Took medications and prn Trazodone, Tylenol, Maalox were given with good result. Pt has scheduled Lidoderm patch x 2 however pt wanted only x 1  applied on lower back. Slept fairly well and no unsafe behaviors observed.     MD interview:   Using walker during interview this morning. Reports that yesterday was weird after ECT - felt paranoid towards her ECT staff because she had no memory of receiving ECT. Every time she receives propofol, she thinks of Donn Pierini. Feels that she has been having word finding problems. Received lactulose yesterday, had numerous bowel movements - about 10. Has already had a BM this morning. Otherwise day went well. Enjoyed playing cards with peers on the unit. Feels she has discovered a spiritual purpose for her suffering in helping peers on the unit.      Expressed significant frustrations towards her sisters and younger son. She reports none of them are aware she is in the hospital, and she feels very let down by this.      Laughing, making jokes through the interview    1330 followup interview: Patient's MMSE was at a 27, 3 points lost for memory.  Patient's BDI was at a 30, down from 51.  Patient states that other patients have noticed a  difference in her mood.  Patient also states that her husband can tell a difference in her voice.  Patient states that her problems with her sisters and son are still the same and thought that they would change with ECT.  Patient would like to forgive and forget regarding her family.  Discussed with patient that she will likely not forgive and forget during this hospitalization and that it would be a longer process with her therapist and other family members.  Patient was also informed that her lactulose dose was decreased from 10g to 6g and will be PRN for constipation.  Patient asked for a suppository earlier for that and it was discussed with patient that lactulose does a better job in terms of ammonia secretion and patient understood.  Patient was also informed that Ozempic was not in formulary while she was in the hospital.    Objective:    Vitals:  Patient Vitals for the past 12 hrs:   BP Temp Temp src Pulse Resp SpO2   07/26/21 1305 101/67 -- -- 78 -- 98 %   07/26/21 1302 116/82 -- -- 78 -- 100 %   07/26/21 1301 105/62 -- -- 71 -- 98 %   07/26/21 1200 -- 36.3 ??C (97.3 ??F) -- -- -- --   07/26/21 0816 109/73 36.3 ??C (97.3 ??F) Temporal 73 20 100 %       Mental Status Exam:    Appearance:    Well nourished, Well developed and Clean/Neat   Motor:   No abnormal movements   Speech/Language:    Normal rate, volume, tone, fluency   Mood:    Weird   Affect:   Euthymic and Mood congruent   Thought process:   Logical, linear, clear, coherent, goal directed   Thought content:      Denies active SI   Perceptual disturbances:     Denies auditory and visual hallucinations, behavior not concerning for response to internal stimuli     Insight:     Fair   Judgment:    Fair   Impulse Control:   Fair   Other:         PE:   Gen: No acute distress, well developed and well nourished  Resp: Normal work of breathing  Neuro: no tics/tremors    Vital Signs:   Vitals:    07/26/21 1305   BP: 101/67   Pulse: 78   Resp:    Temp:    SpO2: 98%       Test Results:  Data Review:   Lab results last 24 hours:    No results found for this or any previous visit (from the past 24 hour(s)).  Imaging: None    Psychometrics:  PHQ-9 PHQ-9 TOTAL SCORE   07/22/2021   8:00 PM 26   07/19/2021   7:23 PM 26   06/18/2021   5:00 PM 24   06/05/2021   9:00 AM 26   11/11/2019   8:00 PM 13         Social Determinants of Health with Concerns     Internet Connectivity: Not on file   Tobacco Use: Medium Risk    Smoking Tobacco Use: Former    Smokeless Tobacco Use: Never    Passive Exposure: Not on file   Substance Use: Not on file   Physical Activity: Not on file   Interpersonal Safety: Not on file   Stress: Not on file   Intimate Partner Violence: Not on file  Social Connections: Not on file        ---  Time-based billing disclaimer:  I personally spent 60   minutes face-to-face and non-face-to-face in the care of this patient, which includes all pre, intra, and post visit time on the date of service.  All documented time was specific to the E/M visit and does not include any procedures that may have been performed.

## 2021-07-26 NOTE — Unmapped (Signed)
07/26/21 1300   Activity/Group Checklist   Tx Duration 50 Min.   Group Psychology   Note Type Progress Note   Interventions Evidence-Based Treatment informed by Dialectical Behavior Therapy (DBT)   Attendance Attended   Follows Direction Followed directions   Interactions Interacted appropriately; Pleasant   Mood Mildly Dysthymic   Affect Broad; Congruent with Topics of Conversation   Equipment/Environment   Equipment / Environment Patient wearing mask for full session   Group Leader Observations   Group Leader Observations Schwanda (she/her) attended today's Psychology Group.  She engaged in introduction activity and helped identify group rules.  Sheetal was an active & appropriate participant throughout group.      Today's group offered an overview of Tuesday's (07/24/21) group, including psychoeducation on anxiety including the roles of the Sympathetic and Parasympathetic Nervous Systems and how they relate to Anxiety.  Group members identified physiological changes associated with these systems.      Writer then offered an introduction to Distress Tolerance and the DBT TIP skills. *TIP stands for: Temperature, Intense Exercise, Paced Breathing.  Group members engaged in conversation related to skills.  Edessa said that she has practiced various versions of the T skill throughout her life but did not realize the physiological underpinnings.  She stated she also enjoys mindful exercises such as Paired Muscle Relaxation & provided a review of this skills to peers. Group members were given DBT Distress Tolerance Handout 6 - TIP SKILLS - Changing your Body Chemistry.

## 2021-07-26 NOTE — Unmapped (Signed)
Patient is alert, oriented x 4, pleasant and cooperative. Denies SI /HI and AVH.  Visible in the milieu, social with peers. Took medications and prn Trazodone, Tylenol, Maalox were given with good result. Pt has scheduled Lidoderm patch x 2 however pt wanted only x 1  applied on lower back. Slept fairly well and no unsafe behaviors observed.

## 2021-07-26 NOTE — Unmapped (Cosign Needed)
Internal Medicine Consult Service     Alexis Burns is a 56 y.o. female with a PMHx of HTN, HLD, obesity, NASH cirrhosis, major depression that presented to [facility] for evaluation and depression and plan for ECT. Pt was seen at the request of Bryn Gulling, MD (Psychiatry (PSY)) for orthostatic hypotension, gait instability, worsening memory.     # Orthostatic Hypotension  Vitals demonstrating that patient was orthostatic again on 5/24, likely in setting of NPO for ECT in addition to excessive stooling overnight from lactulose. Gave 1 L of fluids and patient no longer with orthostatic vitals on 5/25.  - Continue to hold home amlodipine as long as patient remains normotensive. Can continue to hold at discharge and have PCP follow-up  - encourage appropriate PO intake  - change lactulose to PRN to avoid excessive stooling/volume depletion  - Consider IV fluids if patient must remain NPO for prolonged periods in anticipation of ECT     # Memory Issues - Mild Cognitive Impairment?  Unclear subjective history of cognitive impairment for several months. Recently evaluated by Neurology 05/2021. At that time, patient was noted to exhibit non-amnestic, primarily attention/concentration and dysexecutive impairment. Imaging without clear evidence of neurodegeneration, cerebrovascular disease, or ischemic injury as significant contributors. Given lack of acute changes in cognition, would agree that most likely drivers include known severe depression potentially confounded by ongoing benzodiazepine use. Hepatic encephalopathy lower on differential given nml LFTs, BUN, and ammonia levels, lack of asterixis on exam. MMSE 25 this admission.   - continue treatment for major depression per Psych team  - clonazepam last filled 07/2021              - recommend discontinuation of clonazepam   - consider scheduled non-benzodiazepine anxiolytic (e.g. Atarax) rather than prn  - recommend ongoing outpatient longitudinal Neurology follow up   - continue rifaximin, PRN lactulose to goal 1 BM/day     # Gait instability - Essential tremor  Unclear history of current gait instability. Documented timeline suggests gait was assessed as nml as of 07/19/21, but unstable 07/20/21 with pt report that they ambulate by hugging walls.  L foot strain 09/2015 requiring temporary CAM walker. R ankle injury 12/2018 temporarily requiring CAM walker. Per primary team, gait currently steady with walker. On interview with Internal Medicine team, patient affirms that gait is transiently unsteady following ECT, and otherwise only abnormal in setting of anxiety-related tremor. Also affirms Hx progressive essential tremor and FHx Parkinsons. Ongoing depression, anxiety, benzodiazepine use, and intermittent hypotension likely contributors - recommendations as above.   - recommendations as above  - Consider discussing with outpatient Neurologist regarding potential indication for evaluation by Movement specialist in outpatient setting given progressive essential tremor and FHx Parkinsons.    At this time, we will sign off. Please do not hesitate to reach back out should questions arise.      For questions between 7:30AM-5PM, please page the Medicine Consult Service pager at [phone]. After 5PM, the Medicine Consult Service is covered by the [facility] On-Call Resident for urgent/emergent questions or concerns.

## 2021-07-26 NOTE — Unmapped (Signed)
Internal Medicine Consult Service    Assessment and Recommendations:   Alexis Burns is a 55 y.o. female with a PMHx of HTN, HLD, obesity, NASH cirrhosis, major depression that presented to [facility] for evaluation and depression and plan for ECT. Pt was seen at the request of Bryn Gulling, MD (Psychiatry (PSY)) for orthostatic hypotension, gait instability, worsening memory.    # Orthostatic Hypotension  Vitals demonstrating that patient was orthostatic this morning, likely in setting of NPO for EC in addition to excessive stooling overnight from lactulose.   - Continue to hold home amlodipine  - encourage appropriate PO intake  - repeat orthostatics this afternoon after 1 L of LR  - change lactulose to PRN to avoid excessive stooling/volume depletion  - if no improvement, plan to investigate alternative causes including medication-induced vs less likely dysautonomia.    # Memory Issues - Mild Cognitive Impairment?  Unclear subjective history of cognitive impairment for several months. Recently evaluated by Neurology 05/2021. At that time, patient was noted to exhibit non-amnestic, primarily attention/concentration and dysexecutive impairment. Imaging without clear evidence of neurodegeneration, cerebrovascular disease, or ischemic injury as significant contributors. Given lack of acute changes in cognition, would agree that most likely drivers include known severe depression potentially confounded by ongoing benzodiazepine use. Hepatic encephalopathy lower on differential given nml LFTs, BUN, and ammonia levels, lack of asterixis on exam. MMSE 25 this admission.   - continue treatment for major depression per Psych team  - clonazepam last filled 07/2021   - recommend discontinuation of clonazepam   - consider scheduled non-benzodiazepine anxiolytic (e.g. Atarax) rather than prn  - recommend ongoing outpatient longitudinal Neurology follow up   - continue rifaximin, PRN lactulose to goal 1 BM/day    # Gait instability - Essential tremor  Unclear history of current gait instability. Documented timeline suggests gait was assessed as nml as of 07/19/21, but unstable 07/20/21 with pt report that they ambulate by hugging walls.  L foot strain 09/2015 requiring temporary CAM walker. R ankle injury 12/2018 temporarily requiring CAM walker. Per primary team, gait currently steady with walker. On interview with Internal Medicine team, patient affirms that gait is transiently unsteady following ECT, and otherwise only abnormal in setting of anxiety-related tremor. Also affirms Hx progressive essential tremor and FHx Parkinsons. Ongoing depression, anxiety, benzodiazepine use, and intermittent hypotension likely contributors - recommendations as above.   - recommendations as above  - Consider discussing with outpatient Neurologist regarding potential indication for evaluation by Movement specialist in outpatient setting given progressive essential tremor and FHx Parkinsons.    For questions between 7:30AM-5PM, please page the Medicine Consult Service pager at [phone]. After 5PM, the Medicine Consult Service is covered by the [facility] On-Call Resident for urgent/emergent questions or concerns.     Reason for Consultation:   Pt was seen at the request of Bryn Gulling, MD (Psychiatry (PSY)) in consultation for orthostatic hypotension, gait instability, worsening memory.    Subjective:   Interval updates:    Patient with significant dizziness this AM, improved but still present this PM. Noting that she was NPO overnight for ECT today. Also noting approximately 6 BM overnight. Still noting mental fog.    Allergies:  Haldol [haloperidol lactate], Cyclosporine, Tetracycline, and Tramadol    Medications:   Prior to Admission medications    Medication Dose, Route, Frequency   acetaminophen (TYLENOL) 500 MG tablet 1,000 mg, Oral, Every 6 hours PRN   albuterol HFA 90 mcg/actuation inhaler 2 puffs, Inhalation,  Every 6 hours PRN amLODIPine (NORVASC) 10 MG tablet 10 mg, Oral, Daily (standard), For high blood pressure   clonazePAM (KLONOPIN) 0.5 MG tablet 0.5 mg, Oral, 3 times a day PRN   desvenlafaxine succinate (PRISTIQ) 100 MG 24 hr tablet 100 mg, Oral, Daily (standard)   ibuprofen (ADVIL,MOTRIN) 200 MG tablet 200 mg, Every 6 hours PRN  Patient not taking: Reported on 07/10/2021   lactulose 10 gram/15 mL solution 20 g, Oral, 3 times a day (standard)   lactulose 10 gram/15 mL solution 20 g, Oral, 3 times a day (standard)   MAGNESIUM ORAL 30 mg, 2 times a day (standard)  Patient not taking: Reported on 07/10/2021   nystatin (MYCOSTATIN) 100,000 unit/gram powder Apply to affected area 3 times daily  Patient not taking: Reported on 07/10/2021   potassium chloride 20 MEQ CR tablet 20 mEq, 2 times a day (standard)  Patient not taking: Reported on 07/10/2021   XIFAXAN 550 mg Tab 550 mg, Oral, 2 times a day (standard)   SUMAtriptan (IMITREX) 50 MG tablet 50 mg, Oral, Every 2 hours PRN   traZODone (DESYREL) 50 MG tablet TAKE 1 TABLET NIGHTLY AS NEEDED FOR SLEEP, AND TAKE ONE-HALF (1/2) TABLET UP TO TWICE A DAY AS NEEDED FOR ANXIETY  Patient taking differently: 0.5 tablets (25 mg total) nightly. Taking 1/2 tab at night   triamcinolone (KENALOG) 0.1 % ointment 1 application., Topical, As needed (once a day), Apply sparingly as needed to affected areas of body for itching        Medical History:  Past Medical History:   Diagnosis Date    ADD (attention deficit disorder)     Anxiety Since childhood    Arthritis 10 years    Asthma     cough variant asthma per pt report    Binge eating disorder     Cirrhosis (CMS-HCC)     Depression 04/11/2016    Hyperlipidemia 20 years    Not interested in meds    Hypertension     Obesity Since childhood       Surgical History:  Past Surgical History:   Procedure Laterality Date    BREAST BIOPSY Left     many biopsies    BREAST CYST ASPIRATION Right 2003    abcess    BREAST EXCISIONAL BIOPSY Left 2014ish    CESAREAN SECTION CHOLECYSTECTOMY      HERNIA REPAIR  2020    HYSTERECTOMY  2001    partial    OOPHORECTOMY      still has one ovary    PR COLONOSCOPY W/BIOPSY SINGLE/MULTIPLE N/A 04/19/2020    Procedure: COLONOSCOPY, FLEXIBLE, PROXIMAL TO SPLENIC FLEXURE; WITH BIOPSY, SINGLE OR MULTIPLE;  Surgeon: Luanne Bras, MD;  Location: [facility];  Service: Gastroenterology    PR REPAIR INCISIONAL HERNIA,STRANG Midline 09/25/2018    Procedure: REPAIR INITIAL INCISIONAL OR VENTRAL HERNIA; INCARCERATED OR STRANGULATED;  Surgeon: Colon Branch, MD;  Location: [facility];  Service: General Surgery    PR SHLDR ARTHROSCOP,SURG,W/ROTAT CUFF REPR Left 09/25/2020    Procedure: ARTHROSCOPY, SHOULDER, SURGICAL; WITH ROTATOR CUFF REPAIR;  Surgeon: Tomasa Rand, MD;  Location[facility];  Service: Orthopedics    PR UPPER GI ENDOSCOPY,DIAGNOSIS N/A 04/19/2020    Procedure: UGI ENDO, INCLUDE ESOPHAGUS, STOMACH, & DUODENUM &/OR JEJUNUM; DX W/WO COLLECTION SPECIMN, BY BRUSH OR WASH;  Surgeon: Luanne Bras, MD;  Location: [facility];  Service: Gastroenterology    WISDOM  TOOTH EXTRACTION         Social History:    Social History     Tobacco Use    Smoking status: Former     Packs/day: 1.00     Years: 15.00     Pack years: 15.00     Types: Cigarettes     Quit date: 04/25/1994     Years since quitting: 27.2    Smokeless tobacco: Never   Vaping Use    Vaping Use: Former   Substance Use Topics    Alcohol use: Not Currently    Drug use: Never        Family History:  Family History   Problem Relation Age of Onset    Lymphoma Mother     Diabetes Mother     Hypertension Mother     Skin cancer Mother     Arthritis Mother     Cancer Mother         lymph    Kidney disease Mother     Vision loss Mother         Macular D.    Parkinsonism Father     Hypertension Father     Colon cancer Father 46    Macular degeneration Father     Alcohol abuse Father     Cancer Father         colon    Depression Father     Vision loss Father         Macular D.    Parkinsonism Sister     Tremor Sister         essential tremor, hands and voice    Seizures Sister     Migraines Sister     Macular degeneration Sister     Miscarriages / [place] Sister     Vision loss Sister         Macular d    Skin cancer Sister     Tremor Sister         no diagnosis    Migraines Sister     Macular degeneration Sister     Vision loss Sister         Macular d    Skin cancer Maternal Grandmother     Stroke Maternal Grandmother     Alcohol abuse Paternal Grandmother     Heart disease Paternal Grandfather     Cancer Son         Spinal cancer, leaving pt disabled    Diabetes Son     Mental illness Son     Neuropathy Son     Depression Son     Drug abuse Son     Hypertension Son     Kidney disease Son     Vision loss Son         Diabetes    Mental illness Son     Intellectual Disability Son     Learning disabilities Son        Review of Systems:  10 systems were reviewed and are negative unless otherwise mentioned in the HPI    Objective:   Physical Exam:  Temp:  [36.8 ??C (98.2 ??F)-36.9 ??C (98.4 ??F)] 36.8 ??C (98.2 ??F)  Heart Rate:  [65-90] 67  Resp:  [12-18] 12  BP: (86-124)/(69-90) 111/87  SpO2:  [95 %-100 %] 99 %, No intake or output data in the 24 hours ending 07/25/21 1710      Gen: Chronically ill-appearing female in NAD  Eyes:  Sclera anicteric, EOMI grossly  HENT: atraumatic, normocephalic, MMM  Heart: RRR, S1, S2, no M/R/G  Lungs: CTAB, no crackles or wheezes  Extremities: no LE edema  Neuro: A&Ox4. Bilateral UE resting tremor; no asterixis   Skin:  No rashes, lesions on clothed exam  Psych: Depressed mood, though answers questions appropriately    Labs/Studies:  Labs and Studies from the last 24hrs per EMR and Reviewed    Imaging: Radiology studies were personally reviewed

## 2021-07-27 MED ADMIN — acetaminophen (TYLENOL) tablet 650 mg: 650 mg | ORAL | @ 22:00:00

## 2021-07-27 MED ADMIN — succinylcholine (ANECTINE) injection: INTRAVENOUS | @ 13:00:00 | Stop: 2021-07-27

## 2021-07-27 MED ADMIN — aluminum-magnesium hydroxide-simethicone (MAALOX MAX) 80-80-8 mg/mL oral suspension: 30 mL | ORAL | @ 02:00:00

## 2021-07-27 MED ADMIN — propofoL (DIPRIVAN) injection: INTRAVENOUS | @ 13:00:00 | Stop: 2021-07-27

## 2021-07-27 MED ADMIN — rifAXIMin (XIFAXAN) tablet 550 mg: 550 mg | ORAL | @ 14:00:00 | Stop: 2021-08-18

## 2021-07-27 MED ADMIN — lidocaine (LIDODERM) 5 % patch 2 patch: 2 | TRANSDERMAL | @ 01:00:00

## 2021-07-27 MED ADMIN — ondansetron (ZOFRAN) injection: INTRAVENOUS | @ 13:00:00 | Stop: 2021-07-27

## 2021-07-27 MED ADMIN — sertraline (ZOLOFT) tablet 25 mg: 25 mg | ORAL | @ 14:00:00

## 2021-07-27 MED ADMIN — rifAXIMin (XIFAXAN) tablet 550 mg: 550 mg | ORAL | @ 01:00:00 | Stop: 2021-08-18

## 2021-07-27 MED ADMIN — promethazine (PHENERGAN) injection: INTRAVENOUS | @ 13:00:00 | Stop: 2021-07-27

## 2021-07-27 MED ADMIN — sodium chloride (NS) 0.9 % infusion: INTRAVENOUS | @ 13:00:00 | Stop: 2021-07-27

## 2021-07-27 MED ADMIN — acetaminophen (TYLENOL) tablet 650 mg: 650 mg | ORAL | @ 02:00:00

## 2021-07-27 MED ADMIN — acetaminophen (TYLENOL) tablet 650 mg: 650 mg | ORAL | @ 16:00:00

## 2021-07-27 MED ADMIN — caffeine citrate (CAFCIT) injection: INTRAVENOUS | @ 13:00:00 | Stop: 2021-07-27

## 2021-07-27 MED ADMIN — traZODone (DESYREL) tablet 25 mg: 25 mg | ORAL | @ 03:00:00 | Stop: 2021-07-26

## 2021-07-27 MED ADMIN — traZODone (DESYREL) tablet 25 mg: 25 mg | ORAL | @ 02:00:00

## 2021-07-27 NOTE — Unmapped (Signed)
Care plan reviewed.  Pt labile during evening; talkative with peers in the dayroom.  Compliant with medication.  Denied SI/AVH/HI.  Given Tylenol for pain.  Pt requested extra dose of Trazodone for sleep- MDOC notified.  NPO after midnight r/t ECT in morning.  No unsafe behaviors noted.  Continue to monitor.

## 2021-07-27 NOTE — Unmapped (Signed)
Psychiatry      Daily Progress Note      Admit date/time: 07/19/2021  5:08 PM   LOS: 8 days      Assessment:  Alexis Burns is a 56 y.o., White race, Not Hispanic, Latino/a, or Spanish origin ethnicity,  ENGLISH speaking female with a history of Major depressive disorder, multiple suicide attempts, who presents for evaluation of Severe depression, plan for ECT.     Alexis Burns is an [facility] patient since 2019 with a history of depression and anxiety, with past hospitalizations in 2017 at [facility] and admission to crisis unit in 2021 for suicidal ideation. Patient presents as a direct admission from clinic after discussion about initiation of index series of ECT.  She describes lifelong mood symptoms that have acutely worsened in the last year in the setting of worsening health.  Follows with ADT clinic.  Patient has had significant depressed mood, cognitive delays, tearfulness, decreased appetite, poor sleep, passive suicidal ideation which has been managed in the outpatient setting with extensive safety planning.  Patient has noted that these symptoms have lasted for 8 months, however, she does not recall the last time that she was happy.  From patient's husband, there have been times where patient has been happy and appears that life stressors will push patient's into a depressive episode.  Patient's symptoms consistent with major depressive disorder.       Patient has long history of mood symptoms dating back to her 45s, she has been established w/ [facility]TC since 2019. September 2021 she was admitted to the [facility] for suicidal ideation with a plan to shoot herself. Per outpatient documentation: She has had multiple previous med trials including: past medication trials include Prozac (stopped working), risperidone (brief trial), lithium (intolerable side effects), buspirone (unhelpful at 20mg  TID), duloxetine (90mg  well-tolerated but not helpful on its own), aripiprazole (as adjunct for duloxetine, eventually stopped due to tremors), Wellbutrin (anxiety, dry mouth), Seroquel (ineffective), Cytomel (dry mouth, joint pain).  Most recently, she is taking Pristiq 100 mg and Klonopin PRN for anxiety.  She is on the ECT schedule for MWF.      At this time, we will initiate sertraline to target her depressive symptoms in addition to ECT. Given her liver disease, will not titrate above 100mg  daily.  Patient started on Sertraline 25 mg on 5/25, will increase to 50 mg on 5/28 as long as patient tolerates it.   MMSE on 5/25 was 27/30 (3 point lost for memory), BDI on 5/25 was 30 (down from 51 on 07/20/21). ECT today 5/26.      Patient started with L calf pain on morning of 5/25.  Patient describes it as a muscle cramp that occurs whenever she gets up and walks and lingers.  No redness, swelling, warmth to area is reassuring.  No shortness of breath or chest pain.  However, given unilateral nature of pain, would like to rule out DVT so will order PVLs.      As of 07/27/2021, continued hospitalization is warranted given severe major depressive symptom burden, suicidal ideation, continued treatment with ECT.      Diagnoses:   Active Problems:    Severe episode of recurrent major depressive disorder, 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)    Plan:  Safety:  -- Continue admission to inpatient psychiatric unit for safety, stabilization, and treatment.   -- Admission status: Voluntary  -- On unit/off unit level of supervision: q15 min checks / Restrict to Unit  -- Medical  equipment: The treatment team has determined that this patient requires the continued use of medical equipment  walker for their care and healing. Specific medical rationale for need for equipment: impaired mobility and fall risk,    Psychiatry:  # Major Depressive Disorder   -- Continue ECT MWF    -- Continue Sertraline 25 mg daily (s5/25), plan to increase to 50 mg on 5/28  -- s/p Effexor XR (disc 5/23) 75 mg due to SE's  -- 07/20/21 MMSE score was 25, 23, BDI score was 51; repeat MMSE 07/26/21 was 27, BDI 07/26/21 was 30  -- MMSE weekly on Tues/Thurs, BDI on Thurs  -- TSH wnl     # Anxiety  -- Stop home klonopin (0.5 mg in AM, lunch and 1 mg at bedtime)  -- HOLD hydroxyzine 25 mg PRN for anxiety    # Insomnia  -- Trazodone 25 mg at bedtime PRN for sleep     Medical:  # 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  Occurs when she walks and lingers. No shortness of breath, no chest pain, no redness, warmth to leg.  -- PVLs ordered    -- Metabolic Monitoring:   Not indicated    Social/Disposition:  -- Continue hospitalization at this time.   -- Primary team to follow up with family, outpatient resources    Patient was seen and plan of care was discussed with the Attending MD, Dr. Perrin Maltese, who agrees with the above statement and plan.     [name], MS3     I evaluated this patient with [name], MS3. I attest that I have reviewed the student note and that the components of the history of the present illness, the physical exam, and the assessment and plan documented were performed by me or were performed in my presence by the student where I verified the documentation and performed (or re-performed) the exam and medical decision making.     Zettie Pho, MD, PGY-2    Subjective:    Hours of sleep overnight :   6 hrs.     The treatment team, including resident and attending physicians, nursing staff, occupational/recreational therapy, and social work/case management, met to discuss the patient's progress and plan of care.    Per 07/27/21 RN Epic note:   Care plan reviewed. Pt labile during evening; talkative with peers in the dayroom. Compliant with medication. Denied SI/AVH/HI. Given Tylenol for pain. Pt requested extra dose of Trazodone for sleep- MDOC notified. NPO after midnight r/t ECT in morning. No unsafe behaviors noted. Continue to mon     MD interview:  Alexis Burns patient after ECT and she was laying in bed, feeling okay.  Will interview later this afternoon.      1330: Patient feels okay after ECT.  Has some headache that she took Tylenol for during lunch that is still there.  Patient started with left leg calf pain this morning.  Occurs  whenever she gets up and walks and describes the pain as lingering.  Discussed with patient that we will plan to monitor her calf pain and get an ultrasound to rule out any blood clots.  Also discussed increasing her sertraline to 50 mg on Sunday if patient continues to tolerate 25 mg dose.      Objective:    Vitals:  Patient Vitals for the past 12 hrs:   BP Temp Temp src Pulse Resp SpO2   07/27/21 1100 -- 35.4 ??C (95.7 ??F) -- -- -- --   07/27/21 1047 100/81 35.4 ??C (95.7 ??F) -- 65 -- 99 %   07/27/21 1008 100/55 35.4 ??C (95.7 ??F) -- 58 -- 96 %   07/27/21 0903 125/73 -- -- 80 14 97 %   07/27/21 0900 129/71 -- -- 78 14 98 %   07/27/21 0857 136/79 -- -- 84 12 97 %   07/27/21 0856 129/70 -- -- 86 12 97 %   07/27/21 0853 134/82 36.9 ??C (98.4 ??F) Temporal 80 14 97 %   07/27/21 0742 121/86 -- -- 77 -- 100 %   07/27/21 0740 126/89 36.1 ??C (97 ??F) Temporal 74 18 100 %       Mental Status Exam:    Appearance:    Well nourished, Well developed and Clean/Neat   Motor:   No abnormal movements   Speech/Language:    Normal rate, volume, tone, fluency   Mood:    Okay   Affect:   Euthymic and Mood congruent   Thought process:   Logical, linear, clear, coherent, goal directed   Thought content:      Denies active SI   Perceptual disturbances:     Denies auditory and visual hallucinations, behavior not concerning for response to internal stimuli     Insight:     Fair   Judgment:    Fair   Impulse Control:   Fair   Other:         PE:   Gen: No acute distress, well developed and well nourished  Resp: Normal work of breathing  Neuro: no tics/tremors  Ext: Calves symmetric, no erythema or swelling. Left calf TTP    Vital Signs:   Vitals:    07/27/21 1100   BP:    Pulse:    Resp:    Temp: 35.4 ??C (95.7 ??F)   SpO2:        Test Results:  Data Review:   Lab results last 24 hours:    No results found for this or any previous visit (from the past 24 hour(s)).  Imaging: None    Psychometrics:  PHQ-9 PHQ-9 TOTAL SCORE   07/22/2021   8:00 PM 26   07/19/2021   7:23 PM 26   06/18/2021   5:00 PM 24   06/05/2021   9:00 AM 26   11/11/2019   8:00 PM 13         Social Determinants of Health with Concerns     Internet Connectivity: Not on file   Tobacco Use: Medium Risk    Smoking Tobacco Use: Former    Smokeless Tobacco Use: Never    Passive Exposure: Not on file   Substance Use: Not on file   Physical Activity: Not on file   Interpersonal Safety: Not on file   Stress: Not on file   Intimate Partner Violence: Not on file   Social Connections: Not on file        ---  Time-based billing disclaimer:  I personally spent 50   minutes face-to-face and non-face-to-face in the care of this patient, which includes all pre, intra, and post visit time on the date of service.  All documented time was specific to the E/M visit and does not include any procedures that may have been performed.

## 2021-07-27 NOTE — Unmapped (Signed)
[facility]  Psychiatry  Treatment Plan      Admit Date/Time:  07/19/2021  5:08 PM  LOS:  LOS: 8 days   Treatment Team: Treatment Team: Attending Provider: Bryn Gulling, MD; Occupational Therapist: Lars Masson, OT; Recreational Therapist: Almyra Brace Roy-Farrug, LRT/CTRS ; Case Manager: Kittie Plater, MSW; Occupational Therapist: Sherryll Burger, OT; Registered Nurse: Knowlton Cellar, RN; Utilization Management: Laveda Abbe, RN; Patient Care Tech: Oretha Caprice, CNA; Registered Nurse: Gari Crown, RN; Medical Student: Cecilie Lowers, CNA; Patient Care Tech: Rexene Agent, CNA    Diagnoses:  Active Problems:    Severe episode of recurrent major depressive disorder, 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)    Stressors: Treatment resistant depression, chronic mental illness, financial stress  Disability Assessment Scale: WHODAS 2.0 Clinically Assessed as Moderate    Reason for admission: Treatment resistant depression    Inpatient Psychometrics:  PHQ-9 PHQ-9 TOTAL SCORE   07/22/2021   8:00 PM 26   07/19/2021   7:23 PM 26   06/18/2021   5:00 PM 24   06/05/2021   9:00 AM 26   11/11/2019   8:00 PM 13      No data found.     Relevant Aspects of Hospital course: Patient presented as a direct admission from clinic after discussion about initiation of index series of ECT.  She describes lifelong mood symptoms that have acutely worsened in the last year in the setting of worsening health.  Follows with ADT clinic.  Patient has had significant depressed mood, cognitive delays, tearfulness, decreased appetite, poor sleep, passive suicidal ideation which has been managed in the outpatient setting with extensive safety planning.  She has completed 4 sessions of ECT since admission here.     Patient Strengths:   Support, Stable living environment, Employment history, Positive communication skills       DISCHARGE GOALS:  Safe disposition for current level of functioning  Appropriate aftercare in place  Not requiring inpatient level of care for medical/psychiatric conditions    THERAPEUTIC GOALS:    Nursing Goals:  Problems & Goals: (Active)       Problem: Adult Behavioral Health Plan of Care       Goal: Plan of Care Review          Goal: Patient-Specific Goal (Individualization)          Goal: Adheres to Safety Considerations for Self and Others          Goal: Absence of New-Onset Illness or Injury          Goal: Optimized Coping Skills in Response to Life Stressors          Goal: Develops/Participates in Therapeutic Alliance to Support Successful Transition          Goal: Rounds/Family Conference             Problem: Asthma Comorbidity       Goal: Maintenance of Asthma Control             Problem: Behavioral Health Comorbidity       Goal: Maintenance of Behavioral Health Symptom Control             Problem: Fall Injury Risk       Goal: Absence of Fall and Fall-Related Injury             Problem: Hypertension Comorbidity       Goal: Blood Pressure in Desired Range  Problem: Suicide Risk       Goal: Absence of Self-Harm              Recreational Therapy Goals:  Goal(s):  1.  By 07/27/21, patient will demonstrate improved motivation for inpatient treatment by participating in at least 2 RT sessions for 15+ minutes/session.    Interventions: Coping skills, Psychosocial counseling   2.  By 07/27/21, patient will demonstrate 3 positive coping skills with no more than 2 cues required/occasion, for use in managing suicidal ideation, demonstrating improved ability to maintain safety outside the hospital setting.        3. By 08/03/21, patient will verbalize plan to implement 2 healthy coping skills for use prn by patient to maintain safety post discharge.                 Interventions(s): Motivators: Unable to Identify motivators  Patient's Identified Treatment Goal: Just to get through these treatments and see if they help me.  Patient's Stressors / Triggers: Patient presents with suicidal ideation with plan to shoot self.  Patient's reproted stressors are: 1 adult son doesn't speak to her, patient has been participating in therapy, which has brought up childhood traumas and patient's sisters do not believe the patient, patient with a history of multiple suicide attempts.  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  Unclear patient's motivation for inpatient treatment at time of evaluation.  Treatment Plan developed in collaboration with: Patient, Treatment Team  Treatment Plan:  1x per day for: 3-4x week   Planned Treatment Duration      Occupational Therapy Goals:  Goal(s):  By 6/9, Pt will demonstrate the use of 4 calming strategies for improved emotional regulation.    Time Frame : 3 weeks   By 6/9/, Pt will identify 3 relapse prevention strategies in order to improve their insight, safety, and health management.    Time Frame : 3 weeks   By 6/9, Pt will develop x 3 strategies to manage health related challenges  with compensatory strategies to increase satisfaction in life roles    Time Frame : 3 weeks                    Interventions(s): Equities trader, ADL retraining, Education - Patient, Relapse prevention, Health management training, Emotion regulation Skills, Functional cognition  Treatment Plan: 1-2x per day, for: 3-4x week while in acute setting for      MD Treatment Goals: Improvement in mood.   MD Treatment Frequency: Daily .   Recommended Treatment: Group therapy, Pharmacotherapy:    Current Facility-Administered Medications   Medication Dose Route Frequency Provider Last Rate Last Admin    acetaminophen (TYLENOL) tablet 650 mg  650 mg Oral Q6H PRN Estelle Grumbles, MD   650 mg at 07/26/21 2130    aluminum-magnesium hydroxide-simethicone (MAALOX MAX) 80-80-8 mg/mL oral suspension  30 mL Oral Q6H PRN Estelle Grumbles, MD   30 mL at 07/26/21 2130    lactulose (CEPHULAC) packet 6 g  6 g Oral Daily PRN Latrelle Dodrill, MD        lidocaine (LIDODERM) 5 % patch 2 patch  2 patch Transdermal Daily Azzie Roup, MD   1 patch at 07/26/21 2127    rifAXIMin (XIFAXAN) tablet 550 mg  550 mg Oral BID Estelle Grumbles, MD   550 mg at 07/27/21 1014    sertraline (ZOLOFT) tablet 25 mg  25 mg Oral Daily Latrelle Dodrill, MD   25 mg at 07/27/21 1015    traZODone (DESYREL) tablet 25 mg  25 mg Oral Nightly PRN Marthann Schiller, MD   25 mg at 07/26/21 2130   , Milieu therapy, Behavioral Health evaluation/assessment process will continue.  Discharge Plan: Return to previous living arrangement.   Barriers to Discharge: Continued disabling psychiatric symptoms: depressed mood .    MD assessment of inpatient progress: I have considered the patient's multidisciplinary goals, relevant hospital course, engagement in interventions, clinical presentation, and relevant psychometrics in a global assessment of treatment progress.  Collectively, the patient is progressing. Will continue to trend patient's progress through weekly multidisciplinary treatment planning..    Clinical Expected Discharge Date: 08/03/2021    Treatment Plan Developed in Collaboration with: Patient  and Treatment team    Treatment Plan Created/Updated by: Latrelle Dodrill    Patient was seen and plan of care was discussed with the Attending MD, Dr. Perrin Maltese , who agrees with the above statement and plan.

## 2021-07-27 NOTE — Unmapped (Signed)
[facility] ECT               Procedure Note                                             Requesting Attending Physician: Bryn Gulling, MD  Admit Date: 07/19/2021     Service Type: Inpatient  Requesting Attending Physician: Bryn Gulling, MD  Service requesting consult: Psychiatry  Consulting service: Psychiatry  Admit Date: 07/19/2021  Service Date: Jul 27, 2021      Time out was taken with staff to confirm correct patient and correct procedure to be performed.    INDICATION FOR ECT: Mood Disorder: Maj Depress D/O, REC  Severe WITHOUT psychotic behavior      TREATMENT HISTORY:    Total Number of Treatments: 4  Current Treatment #: 4    Treatment Type: Index    Electrode Placement: Left Frontal Right Temporal      RATING SCALES:    CGI-Change score for ECT series:   CGI-C: 4. No change    Beck Depression Inventory Total Score: 30 51    Mini-Mental Status ExamTotal Score: 27 25     Bush-Francis CatatoniaTotal Score:       MEDICAL INFORMATION:    Allergies:  Haldol [haloperidol lactate], Cyclosporine, Tetracycline, and Tramadol    Medical History:  See ECT Consult    Surgical History:  See ECT Consult    VITAL SIGNS:   119/75  71      Post-procedure:  158/83  68      THYMATRON:  ECT # Thymatron Settings (%) Modification Seizure   Length Cuff Duration  (sec) EEG Duration  (sec) Complications Interventions   1 70 Good Adequate 0 38                                                 Medications given during procedure:   Current Facility-Administered Medications   Medication Dose Route Frequency Provider Last Rate Last Admin    acetaminophen (TYLENOL) tablet 650 mg  650 mg Oral Q6H PRN Estelle Grumbles, MD   650 mg at 07/26/21 2130    aluminum-magnesium hydroxide-simethicone (MAALOX MAX) 80-80-8 mg/mL oral suspension  30 mL Oral Q6H PRN Estelle Grumbles, MD   30 mL at 07/26/21 2130    lactulose (CEPHULAC) packet 6 g  6 g Oral Daily PRN Latrelle Dodrill, MD        lidocaine (LIDODERM) 5 % patch 2 patch  2 patch Transdermal Daily Azzie Roup, MD   1 patch at 07/26/21 2127    rifAXIMin (XIFAXAN) tablet 550 mg  550 mg Oral BID Estelle Grumbles, MD   550 mg at 07/26/21 2128    sertraline (ZOLOFT) tablet 25 mg  25 mg Oral Daily Latrelle Dodrill, MD   25 mg at 07/26/21 0811    traZODone (DESYREL) tablet 25 mg  25 mg Oral Nightly PRN Marthann Schiller, MD   25 mg at 07/26/21 2130        Medication during hospitalization         acetaminophen (TYLENOL) tablet 650 mg Admin Date  07/19/2021  20:55 Action  Given Dose  650 mg  Route  Oral Site          acetaminophen (TYLENOL) tablet 650 mg Admin Date  07/20/2021  11:55 Action  Given Dose  650 mg Route  Oral Site          acetaminophen (TYLENOL) tablet 650 mg Admin Date  07/20/2021  21:08 Action  Given Dose  650 mg Route  Oral Site          acetaminophen (TYLENOL) tablet 650 mg Admin Date  07/21/2021  09:23 Action  Given Dose  650 mg Route  Oral Site          acetaminophen (TYLENOL) tablet 650 mg Admin Date  07/21/2021  20:53 Action  Given Dose  650 mg Route  Oral Site          acetaminophen (TYLENOL) tablet 650 mg Admin Date  07/22/2021  08:18 Action  Given Dose  650 mg Route  Oral Site          acetaminophen (TYLENOL) tablet 650 mg Admin Date  07/22/2021  18:51 Action  Given Dose  650 mg Route  Oral Site          acetaminophen (TYLENOL) tablet 650 mg Admin Date  07/23/2021  10:26 Action  Given Dose  650 mg Route  Oral Site          acetaminophen (TYLENOL) tablet 650 mg Admin Date  07/23/2021  20:36 Action  Given Dose  650 mg Route  Oral Site          acetaminophen (TYLENOL) tablet 650 mg Admin Date  07/24/2021  20:22 Action  Given Dose  650 mg Route  Oral Site          acetaminophen (TYLENOL) tablet 650 mg Admin Date  07/25/2021  20:04 Action  Given Dose  650 mg Route  Oral Site          acetaminophen (TYLENOL) tablet 650 mg Admin Date  07/26/2021  21:30 Action  Given Dose  650 mg Route  Oral Site          aluminum-magnesium hydroxide-simethicone (MAALOX MAX) 80-80-8 mg/mL oral suspension Admin Date  07/19/2021  20:54 Action  Given Dose  30 mL Route  Oral Site          aluminum-magnesium hydroxide-simethicone (MAALOX MAX) 80-80-8 mg/mL oral suspension Admin Date  07/20/2021  21:08 Action  Given Dose  30 mL Route  Oral Site          aluminum-magnesium hydroxide-simethicone (MAALOX MAX) 80-80-8 mg/mL oral suspension Admin Date  07/22/2021  22:19 Action  Given Dose  30 mL Route  Oral Site      Comments: heartburn        aluminum-magnesium hydroxide-simethicone (MAALOX MAX) 80-80-8 mg/mL oral suspension Admin Date  07/23/2021  20:36 Action  Given Dose  30 mL Route  Oral Site      Comments: heart burn        aluminum-magnesium hydroxide-simethicone (MAALOX MAX) 80-80-8 mg/mL oral suspension Admin Date  07/24/2021  15:38 Action  Given Dose  30 mL Route  Oral Site          aluminum-magnesium hydroxide-simethicone (MAALOX MAX) 80-80-8 mg/mL oral suspension Admin Date  07/25/2021  22:18 Action  Given Dose  30 mL Route  Oral Site          aluminum-magnesium hydroxide-simethicone (MAALOX MAX) 80-80-8 mg/mL oral suspension Admin Date  07/26/2021  21:30 Action  Given Dose  30 mL Route  Oral Site      Comments:  heartburn        hydrOXYzine (ATARAX) tablet 25 mg Admin Date  07/19/2021  20:54 Action  Given Dose  25 mg Route  Oral Site          hydrOXYzine (ATARAX) tablet 25 mg Admin Date  07/20/2021  13:56 Action  Given Dose  25 mg Route  Oral Site          hydrOXYzine (ATARAX) tablet 25 mg Admin Date  07/20/2021  21:08 Action  Given Dose  25 mg Route  Oral Site          hydrOXYzine (ATARAX) tablet 25 mg Admin Date  07/21/2021  20:27 Action  Given Dose  25 mg Route  Oral Site      Comments: anxiety        hydrOXYzine (ATARAX) tablet 25 mg Admin Date  07/22/2021  14:18 Action  Given Dose  25 mg Route  Oral Site          hydrOXYzine (ATARAX) tablet 25 mg Admin Date  07/22/2021  20:33 Action  Given Dose  25 mg Route  Oral Site      Comments: anxiety        hydrOXYzine (ATARAX) tablet 25 mg Admin Date  07/23/2021  12:35 Action  Given Dose  25 mg Route  Oral Site          hydrOXYzine (ATARAX) tablet 25 mg Admin Date  07/23/2021  20:36 Action  Given Dose  25 mg Route  Oral Site      Comments: anxiety        lactated ringers bolus 1,000 mL Admin Date  07/25/2021  15:17 Action  New Bag Dose  1,000 mL Route  Intravenous Site          lactulose (CEPHULAC) packet 10 g Admin Date  07/25/2021  12:46 Action  Given Dose  10 g Route  Oral Site          lactulose (CEPHULAC) packet 10 g Admin Date  07/25/2021  12:50 Action  Given Dose  10 g Route  Oral Site          lactulose (CEPHULAC) packet 20 g Admin Date  07/24/2021  15:38 Action  Given Dose  20 g Route  Oral Site          lidocaine (LIDODERM) 5 % patch 2 patch Admin Date  07/21/2021  20:53 Action  Patch Applied Dose  2 patch Route  Transdermal Site  Other    Comments: L shoulder, bilat lower back        lidocaine (LIDODERM) 5 % patch 2 patch Admin Date  07/22/2021  20:33 Action  Patch Applied Dose  2 patch Route  Transdermal Site  Other    Comments: shoulder back        lidocaine (LIDODERM) 5 % patch 2 patch Admin Date  07/25/2021  20:02 Action  Patch Applied Dose  1 patch Route  Transdermal Site  Other    Comments: lower back x1 pewr pt's request        lidocaine (LIDODERM) 5 % patch 2 patch Admin Date  07/26/2021  21:27 Action  Patch Applied Dose  1 patch Route  Transdermal Site  Other    Comments: back x1 patch        rifAXIMin (XIFAXAN) tablet 550 mg Admin Date  07/19/2021  20:52 Action  Given Dose  550 mg Route  Oral Site          rifAXIMin (XIFAXAN) tablet 550 mg  Admin Date  07/20/2021  12:55 Action  Given Dose  550 mg Route  Oral Site      Comments: Patient was in ECT        rifAXIMin (XIFAXAN) tablet 550 mg Admin Date  07/20/2021  21:07 Action  Given Dose  550 mg Route  Oral Site          rifAXIMin (XIFAXAN) tablet 550 mg Admin Date  07/21/2021  09:22 Action  Given Dose  550 mg Route  Oral Site          rifAXIMin (XIFAXAN) tablet 550 mg Admin Date  07/21/2021  20:27 Action  Given Dose  550 mg Route  Oral Site          rifAXIMin (XIFAXAN) tablet 550 mg Admin Date  07/22/2021  08:21 Action  Given Dose  550 mg Route  Oral Site          rifAXIMin (XIFAXAN) tablet 550 mg Admin Date  07/22/2021  20:33 Action  Given Dose  550 mg Route  Oral Site          rifAXIMin (XIFAXAN) tablet 550 mg Admin Date  07/23/2021  10:16 Action  Given Dose  550 mg Route  Oral Site          rifAXIMin (XIFAXAN) tablet 550 mg Admin Date  07/23/2021  20:36 Action  Given Dose  550 mg Route  Oral Site          rifAXIMin (XIFAXAN) tablet 550 mg Admin Date  07/24/2021  08:43 Action  Given Dose  550 mg Route  Oral Site          rifAXIMin (XIFAXAN) tablet 550 mg Admin Date  07/24/2021  20:23 Action  Given Dose  550 mg Route  Oral Site          rifAXIMin (XIFAXAN) tablet 550 mg Admin Date  07/25/2021  20:02 Action  Given Dose  550 mg Route  Oral Site          rifAXIMin (XIFAXAN) tablet 550 mg Admin Date  07/26/2021  08:11 Action  Given Dose  550 mg Route  Oral Site          rifAXIMin (XIFAXAN) tablet 550 mg Admin Date  07/26/2021  21:28 Action  Given Dose  550 mg Route  Oral Site          sertraline (ZOLOFT) tablet 25 mg Admin Date  07/26/2021  08:11 Action  Given Dose  25 mg Route  Oral Site          traZODone (DESYREL) tablet 25 mg Admin Date  07/22/2021  22:18 Action  Given Dose  25 mg Route  Oral Site      Comments: sleep        traZODone (DESYREL) tablet 25 mg Admin Date  07/23/2021  20:40 Action  Given Dose  25 mg Route  Oral Site      Comments: sleep        traZODone (DESYREL) tablet 25 mg Admin Date  07/24/2021  20:22 Action  Given Dose  25 mg Route  Oral Site      Comments: sleep        traZODone (DESYREL) tablet 25 mg Admin Date  07/25/2021  20:03 Action  Given Dose  25 mg Route  Oral Site          traZODone (DESYREL) tablet 25 mg Admin Date  07/26/2021  21:30 Action  Given Dose  25 mg Route  Oral Site  Comments: sleep        traZODone (DESYREL) tablet 25 mg Admin Date  07/26/2021  22:32 Action  Given Dose  25 mg Route  Oral Site          traZODone (DESYREL) tablet 50 mg Admin Date  07/19/2021  20:54 Action  Given Dose  50 mg Route  Oral Site          traZODone (DESYREL) tablet 50 mg Admin Date  07/20/2021  21:08 Action  Given Dose  50 mg Route  Oral Site          traZODone (DESYREL) tablet 50 mg Admin Date  07/21/2021  20:28 Action  Given Dose  50 mg Route  Oral Site      Comments: sleep        venlafaxine (EFFEXOR-XR) 24 hr capsule 75 mg Admin Date  07/20/2021  16:49 Action  Given Dose  75 mg Route  Oral Site      Comments: Medication not available at scheduled time.        venlafaxine (EFFEXOR-XR) 24 hr capsule 75 mg Admin Date  07/21/2021  09:21 Action  Given Dose  75 mg Route  Oral Site      Comments: medication was not available.        venlafaxine (EFFEXOR-XR) 24 hr capsule 75 mg Admin Date  07/22/2021  08:21 Action  Given Dose  75 mg Route  Oral Site          venlafaxine (EFFEXOR-XR) 24 hr capsule 75 mg Admin Date  07/23/2021  10:16 Action  Given Dose  75 mg Route  Oral Site              Neck Collar Used:  Yes    PROGRESS NOTE:     Alexis Burns is a 57 y.o. who presents for initiation of index series of ECT as an inpatient.    he did not sleep well last night because her roommate had stomach issues overnight. She endorses R calf pain - encouraged her to talk to her primary team about it. She states she is noticing improvements with ECT - she does not have racing thoughts, her emotional lability is better, and her lows her also not as intense. She doesn't think she's ready to go home but she is halfway there. Denies SI over the last few days. She endorses pre- and post-treatment and some short term memory issues. No other side effects.     Mental Status Exam:  Appearance:    Well nourished and Well developed   Behavior:  Calm and Cooperative   Motor:   No abnormal movements   Speech/Language:    Normal rate, volume, tone, fluency and Language intact, well formed Mood:   I feel better   Affect:   Calm, Constricted, Euthymic, Mood congruent, and somewhat jovial   Thought process:   Logical, linear, clear, coherent, goal directed   Thought content:     No SI   Perceptual disturbances:     Denies auditory and visual hallucinations, behavior not concerning for response to internal stimuli     Orientation:   Oriented to person, place, time, and general circumstances   Attention:   Able to fully attend without fluctuations in consciousness   Concentration:   Able to fully concentrate and attend   Memory:    MMSE 25/30    Fund of knowledge:    Consistent with level of education and development   Insight:  Fair   Judgment:    Fair   Impulse Control:   Fair     Physical Exam:   Gen: NAD  Pulm: No increased work of breathing  Neuro: No tremors, tics noted, no abnormal movements    A/P: Alexis Burns is a 56 y.o. who presents for initiation of index series of ECT as an inpatient for treatment of major depressive disorder, recurrent, severe, without psychotic features.     Currently with LFRT placement and does report response following last treatment, so she may not need to pivot to bilateral lead placement if gains continue.     Post-Treatment Complications: None    Recommendations for Future Treatments:  increase energy given 1) amplitude and morphology were high through 16 seconds and then decreased; 2) no motor reading    Next Treatment Date: 5/31    Meds to take prior to ECT: None    Special Instructions: Do not take these medications after 6pm on the night before ECT: Klonopin/clonazepam (prescribed as an outpatient but not currently prescribed inpatient)    Assisted By: Thornell Sartorius, MD    Gabriel Cirri, MD

## 2021-07-27 NOTE — Unmapped (Signed)
Ms. Mcbane has been calm and cooperative today. She attended group and spent most of the day in the dayroom watching tv or reading her book. Pt is med compliant and did not request any Prns. Ortho vitals were taken during my shift. She was out for all meals. Denies SI/HI. Will continue to monitor.  Problem: Adult Behavioral Health Plan of Care  Goal: Plan of Care Review  Outcome: Progressing  Goal: Patient-Specific Goal (Individualization)  Outcome: Progressing  Goal: Adheres to Safety Considerations for Self and Others  Outcome: Progressing  Goal: Absence of New-Onset Illness or Injury  Outcome: Progressing  Intervention: Prevent Infection  Recent Flowsheet Documentation  Taken 07/26/2021 1200 by Gari Crown, RN  Infection Prevention:  ??? cohorting utilized  ??? environmental surveillance performed  ??? equipment surfaces disinfected  ??? hand hygiene promoted  ??? personal protective equipment utilized  ??? rest/sleep promoted  Goal: Optimized Coping Skills in Response to Life Stressors  Outcome: Progressing  Goal: Develops/Participates in Therapeutic Alliance to Support Successful Transition  Outcome: Progressing  Goal: Rounds/Family Conference  Outcome: Progressing     Problem: Fall Injury Risk  Goal: Absence of Fall and Fall-Related Injury  Outcome: Progressing  Intervention: Promote Scientist, clinical (histocompatibility and immunogenetics) Documentation  Taken 07/26/2021 1200 by Gari Crown, RN  Safety Interventions:  ??? bed alarm  ??? environmental modification  ??? low bed  ??? lighting adjusted for tasks/safety  ??? nonskid shoes/slippers when out of bed  ??? suicide precautions     Problem: Suicide Risk  Goal: Absence of Self-Harm  Outcome: Progressing  Intervention: Assess Risk to Self and Maintain Safety  Recent Flowsheet Documentation  Taken 07/26/2021 1200 by Gari Crown, RN  Behavior Management:  ??? behavioral plan developed  ??? behavioral plan reviewed  ??? boundaries reinforced  ??? impulse control promoted  Intervention: Promote Psychosocial Wellbeing  Recent Flowsheet Documentation  Taken 07/26/2021 1200 by Gari Crown, RN  Sleep/Rest Enhancement:  ??? awakenings minimized  ??? consistent schedule promoted  ??? natural light exposure provided  ??? noise level reduced  ??? relaxation techniques promoted  ??? regular sleep/rest pattern promoted     Problem: Asthma Comorbidity  Goal: Maintenance of Asthma Control  Outcome: Progressing     Problem: Behavioral Health Comorbidity  Goal: Maintenance of Behavioral Health Symptom Control  Outcome: Progressing     Problem: Hypertension Comorbidity  Goal: Blood Pressure in Desired Range  Outcome: Progressing

## 2021-07-28 MED ADMIN — sertraline (ZOLOFT) tablet 25 mg: 25 mg | ORAL | @ 12:00:00 | Stop: 2021-07-28

## 2021-07-28 MED ADMIN — acetaminophen (TYLENOL) tablet 650 mg: 650 mg | ORAL | @ 12:00:00

## 2021-07-28 MED ADMIN — traZODone (DESYREL) tablet 25 mg: 25 mg | ORAL | @ 02:00:00

## 2021-07-28 MED ADMIN — rifAXIMin (XIFAXAN) tablet 550 mg: 550 mg | ORAL | @ 02:00:00 | Stop: 2021-08-18

## 2021-07-28 MED ADMIN — hydrOXYzine (ATARAX) tablet 10 mg: 10 mg | ORAL | @ 18:00:00

## 2021-07-28 MED ADMIN — lidocaine (LIDODERM) 5 % patch 2 patch: 2 | TRANSDERMAL | @ 02:00:00

## 2021-07-28 MED ADMIN — rifAXIMin (XIFAXAN) tablet 550 mg: 550 mg | ORAL | @ 12:00:00 | Stop: 2021-08-18

## 2021-07-28 MED ADMIN — traZODone (DESYREL) tablet 25 mg: 25 mg | ORAL | @ 02:00:00 | Stop: 2021-07-27

## 2021-07-28 NOTE — Unmapped (Signed)
Care plan reviewed and 1:1 interaction done with patient. . Patient was in her room at shift change talking to her room made. She was calm, sad and emotional. She denied SI/HI/AVH.patient took night time medications including prn trazodone with effective result. No unsafe behavioral issues noted.

## 2021-07-28 NOTE — Unmapped (Signed)
Psychiatry      Daily Progress Note      Admit date/time: 07/19/2021  5:08 PM   LOS: 9 days      Assessment:  Alexis Burns is a 56 y.o., White race, Not Hispanic, Latino/a, or Spanish origin ethnicity,  ENGLISH speaking female with a history of Major depressive disorder, multiple suicide attempts, who presents for evaluation of Severe depression, plan for ECT.     Alexis Burns is an ADTC patient since 2019 with a history of depression and anxiety, with past hospitalizations in 2017 at [facility] and admission to crisis unit in 2021 for suicidal ideation. Patient presents as a direct admission from clinic after discussion about initiation of index series of ECT.  She describes lifelong mood symptoms that have acutely worsened in the last year in the setting of worsening health.  Follows with ADT clinic.  Patient has had significant depressed mood, cognitive delays, tearfulness, decreased appetite, poor sleep, passive suicidal ideation which has been managed in the outpatient setting with extensive safety planning.  Patient has noted that these symptoms have lasted for 8 months, however, she does not recall the last time that she was happy.  From patient's husband, there have been times where patient has been happy and appears that life stressors will push patient's into a depressive episode.  Patient's symptoms consistent with major depressive disorder.       Patient has long history of mood symptoms dating back to her 45s, she has been established w/ ADTC since 2019. September 2021 she was admitted to the [facility] for suicidal ideation with a plan to shoot herself. Per outpatient documentation: She has had multiple previous med trials including: past medication trials include Prozac (stopped working), risperidone (brief trial), lithium (intolerable side effects), buspirone (unhelpful at 20mg  TID), duloxetine (90mg  well-tolerated but not helpful on its own), aripiprazole (as adjunct for duloxetine, eventually stopped due to tremors), Wellbutrin (anxiety, dry mouth), Seroquel (ineffective), Cytomel (dry mouth, joint pain).  Most recently, she is taking Pristiq 100 mg and Klonopin PRN for anxiety.  She is on the ECT schedule for MWF.      At this time, we will initiate sertraline to target her depressive symptoms in addition to ECT. Given her liver disease, will not titrate above 100mg  daily.  Patient started on Sertraline 25 mg on 5/25, will increase to 50 mg on 5/28 as long as patient tolerates it.   MMSE on 5/25 was 27/30 (3 point lost for memory), BDI on 5/25 was 30 (down from 51 on 07/20/21). ECT today 5/26.      Patient started with L calf pain on morning of 5/25.  Patient describes it as a muscle cramp that occurs whenever she gets up and walks and lingers.  No redness, swelling, warmth to area is reassuring.  No shortness of breath or chest pain.  However, given unilateral nature of pain, would like to rule out DVT so will order PVLs.      As of 07/28/2021, continued hospitalization is warranted given severe major depressive symptom burden, suicidal ideation, continued treatment with ECT.      Diagnoses:   Active Problems:    Severe episode of recurrent major depressive disorder, 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)    Plan:  Safety:  -- Continue admission to inpatient psychiatric unit for safety, stabilization, and treatment.   -- Admission status: Voluntary  -- On unit/off unit level of supervision: q15 min checks / Restrict to Unit  -- Medical  equipment: The treatment team has determined that this patient requires the continued use of medical equipment  walker for their care and healing. Specific medical rationale for need for equipment: impaired mobility and fall risk,    Psychiatry:  # Major Depressive Disorder   -- Continue ECT MWF    -- Continue Sertraline 25 mg daily (s5/25), plan to increase to 50 mg on 5/28  -- s/p Effexor XR (disc 5/23) 75 mg due to SE's  -- 07/20/21 MMSE score was 25, 23, BDI score was 51; repeat MMSE 07/26/21 was 27, BDI 07/26/21 was 30  -- MMSE weekly on Tues/Thurs, BDI on Thurs  -- TSH wnl     # Anxiety  -- Stop home klonopin (0.5 mg in AM, lunch and 1 mg at bedtime)  -- HOLD hydroxyzine 25 mg PRN for anxiety    # Insomnia  -- Trazodone 25 mg at bedtime PRN for sleep     Medical:  # 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  Occurs when she walks and lingers. No shortness of breath, no chest pain, no redness, warmth to leg.  -- PVLs ordered    -- Metabolic Monitoring:   Not indicated      Social/Disposition:  -- Continue hospitalization at this time.   -- Primary team to follow up with family, outpatient resources    WEEKEND ROUNDS   07/28/21 PT Seen chart reviewed, continues treatment plan as above, but will titrate zoloft to 50mg , has not had se or ae to the 25mg  , also confirm hydroxyzine written for tid , as she notes anxiety (had been on klonopin at home)      [name] MD    Subjective:    Hours of sleep overnight :   8 hrs.         Per 5/27 /23 RN Epic note:   Care plan reviewed and 1:1 interaction done with patient. . Patient was in her room at shift change talking to her room made. She was calm, sad and emotional. She denied SI/HI/AVH.patient took night time medications including prn trazodone with effective result. No unsafe behavioral issues noted.    MD interview:  Has had persistent depression, initiation of ect though she is not sure of benefit yet. Mood low, is on zoloft 25mg  no se or ae and is agreeable to titrate to 50mg , also c/o anxiety , notes she was on klonopin at home but dc'd with ect , has atarax she thinks prn , but requests if able to schedule it , if not already to tid not prn.  She also c/o constipation , lactulose at 6mg  does lead to effectiveness , she was hoping for happy medium   Given ammonia, liver issues no change of that made today  Had ultrasound of calf today , with initial report negative for clot   Pain persists though      Objective:    Vitals:  Patient Vitals for the past 12 hrs:   BP Temp Temp src Pulse Resp SpO2   07/28/21 0816 113/81 -- -- 75 -- --   07/28/21 0814 113/81 36.2 ??C (97.1 ??F) Temporal 82 16 99 %   07/28/21 0812 102/89 36.2 ??C (97.1 ??F) Temporal 90 16 98 %   07/28/21 0810 117/72 36.3 ??C (97.4 ??F) Temporal 68 16 97 %   07/28/21 0800 -- 36.2 ??C (97.1 ??F) -- -- -- --       Mental Status Exam:    Appearance:    Well nourished, Well developed and Clean/Neat   Motor:   No abnormal movements   Speech/Language:    Normal rate, volume, tone, fluency   Mood:    Depressed    Affect:   fair to broad range/reactivity    Thought process:   Logical, linear, clear, coherent, goal directed   Thought content:      Denies active SI   Perceptual disturbances:     Denies auditory and visual hallucinations, behavior not concerning for response to internal stimuli     Insight:     Fair   Judgment:    Fair   Impulse Control:   Fair   Other:         PE:   Gen: No acute distress, well developed and well nourished  Resp: Normal work of breathing      Vital Signs:   Vitals:    07/28/21 0816   BP: 113/81   Pulse: 75   Resp:    Temp:    SpO2:        Test Results:  Data Review:   Lab results last 24 hours:    No results found for this or any previous visit (from the past 24 hour(s)).  Imaging: None    Psychometrics:  PHQ-9 PHQ-9 TOTAL SCORE   07/22/2021   8:00 PM 26   07/19/2021   7:23 PM 26   06/18/2021   5:00 PM 24   06/05/2021   9:00 AM 26   11/11/2019   8:00 PM 13         Social Determinants of Health with Concerns     Internet Connectivity: Not on file Tobacco Use: Medium Risk   ??? Smoking Tobacco Use: Former   ??? Smokeless Tobacco Use: Never   ??? Passive Exposure: Not on file   Substance Use: Not on file   Physical Activity: Not on file   Interpersonal Safety: Not on file   Stress: Not on file   Intimate Partner Violence: Not on file   Social Connections: Not on file        ---  Time-based billing disclaimer:

## 2021-07-28 NOTE — Unmapped (Signed)
Alexis Burns has been calm and cooperative throughout the shift. She attended group today, has spent most of the day in the dayroom interacting with other patients. She reported leg pain and received prn for pain. She had ECT today no complications. Denies SI, HI. Will continue to monitor.   Problem: Adult Behavioral Health Plan of Care  Goal: Plan of Care Review  Outcome: Progressing  Goal: Patient-Specific Goal (Individualization)  Outcome: Progressing  Goal: Adheres to Safety Considerations for Self and Others  Outcome: Progressing  Goal: Absence of New-Onset Illness or Injury  Outcome: Progressing  Intervention: Prevent Infection  Recent Flowsheet Documentation  Taken 07/27/2021 1100 by Gari Crown, RN  Infection Prevention:   cohorting utilized   environmental surveillance performed   equipment surfaces disinfected   personal protective equipment utilized   hand hygiene promoted   rest/sleep promoted   single patient room provided  Goal: Optimized Coping Skills in Response to Life Stressors  Outcome: Progressing  Goal: Develops/Participates in Biomedical scientist to Support Successful Transition  Outcome: Progressing  Goal: Rounds/Family Conference  Outcome: Progressing     Problem: Fall Injury Risk  Goal: Absence of Fall and Fall-Related Injury  Outcome: Progressing  Intervention: Identify and Manage Contributors  Recent Flowsheet Documentation  Taken 07/27/2021 1100 by Gari Crown, RN  Self-Care Promotion: independence encouraged  Intervention: Promote Injury-Free Environment  Recent Flowsheet Documentation  Taken 07/27/2021 1100 by Gari Crown, RN  Safety Interventions:   elopement precautions   chemotherapeutic agent precautions   lighting adjusted for tasks/safety   low bed   suicide precautions     Problem: Suicide Risk  Goal: Absence of Self-Harm  Outcome: Progressing  Intervention: Assess Risk to Self and Maintain Safety  Recent Flowsheet Documentation  Taken 07/27/2021 1100 by Gari Crown, RN  Behavior Management:   behavioral plan reviewed   behavioral plan developed   boundaries reinforced   impulse control promoted  Intervention: Promote Psychosocial Wellbeing  Recent Flowsheet Documentation  Taken 07/27/2021 1100 by Gari Crown, RN  Sleep/Rest Enhancement:   awakenings minimized   consistent schedule promoted   relaxation techniques promoted   regular sleep/rest pattern promoted     Problem: Asthma Comorbidity  Goal: Maintenance of Asthma Control  Outcome: Progressing     Problem: Behavioral Health Comorbidity  Goal: Maintenance of Behavioral Health Symptom Control  Outcome: Progressing     Problem: Hypertension Comorbidity  Goal: Blood Pressure in Desired Range  Outcome: Progressing

## 2021-07-29 MED ADMIN — traZODone (DESYREL) tablet 25 mg: 25 mg | ORAL | @ 01:00:00

## 2021-07-29 MED ADMIN — hydrOXYzine (ATARAX) tablet 10 mg: 10 mg | ORAL | @ 01:00:00

## 2021-07-29 MED ADMIN — hydrOXYzine (ATARAX) tablet 10 mg: 10 mg | ORAL | @ 14:00:00

## 2021-07-29 MED ADMIN — lidocaine (LIDODERM) 5 % patch 2 patch: 2 | TRANSDERMAL | @ 01:00:00

## 2021-07-29 MED ADMIN — rifAXIMin (XIFAXAN) tablet 550 mg: 550 mg | ORAL | @ 14:00:00 | Stop: 2021-08-18

## 2021-07-29 MED ADMIN — sertraline (ZOLOFT) tablet 50 mg: 50 mg | ORAL | @ 14:00:00

## 2021-07-29 MED ADMIN — aluminum-magnesium hydroxide-simethicone (MAALOX MAX) 80-80-8 mg/mL oral suspension: 30 mL | ORAL | @ 01:00:00

## 2021-07-29 MED ADMIN — hydrOXYzine (ATARAX) tablet 10 mg: 10 mg | ORAL | @ 18:00:00

## 2021-07-29 MED ADMIN — acetaminophen (TYLENOL) tablet 650 mg: 650 mg | ORAL | @ 01:00:00

## 2021-07-29 MED ADMIN — rifAXIMin (XIFAXAN) tablet 550 mg: 550 mg | ORAL | @ 01:00:00 | Stop: 2021-08-18

## 2021-07-29 NOTE — Unmapped (Signed)
Alexis Burns has been calm and cooperative throughout the shift. This morning she reported to have left leg pain this morning and received prn tylenol. Pt spent most of the day in the dayroom interacting with others and watching tv. She did some laundry today and has been out for all meals. She denies SI/HI. Will continue to monitor.  Problem: Adult Behavioral Health Plan of Care  Goal: Plan of Care Review  Outcome: Progressing  Goal: Patient-Specific Goal (Individualization)  Outcome: Progressing  Goal: Adheres to Safety Considerations for Self and Others  Outcome: Progressing  Goal: Absence of New-Onset Illness or Injury  Outcome: Progressing  Intervention: Prevent Infection  Recent Flowsheet Documentation  Taken 07/28/2021 0800 by Gari Crown, RN  Infection Prevention:   cohorting utilized   environmental surveillance performed   equipment surfaces disinfected   rest/sleep promoted   personal protective equipment utilized   hand hygiene promoted  Goal: Optimized Coping Skills in Response to Life Stressors  Outcome: Progressing  Goal: Develops/Participates in Biomedical scientist to Support Successful Transition  Outcome: Progressing  Goal: Rounds/Family Conference  Outcome: Progressing     Problem: Fall Injury Risk  Goal: Absence of Fall and Fall-Related Injury  Outcome: Progressing  Intervention: Promote Scientist, clinical (histocompatibility and immunogenetics) Documentation  Taken 07/28/2021 0800 by Gari Crown, RN  Safety Interventions:   environmental modification   lighting adjusted for tasks/safety   low bed   nonskid shoes/slippers when out of bed   suicide precautions     Problem: Suicide Risk  Goal: Absence of Self-Harm  Outcome: Progressing  Intervention: Assess Risk to Self and Maintain Safety  Recent Flowsheet Documentation  Taken 07/28/2021 0800 by Gari Crown, RN  Behavior Management:   behavioral plan developed   boundaries reinforced   behavioral plan reviewed   impulse control promoted  Intervention: Promote Psychosocial Wellbeing  Recent Flowsheet Documentation  Taken 07/28/2021 0800 by Gari Crown, RN  Sleep/Rest Enhancement:   consistent schedule promoted   awakenings minimized   noise level reduced   regular sleep/rest pattern promoted   relaxation techniques promoted     Problem: Asthma Comorbidity  Goal: Maintenance of Asthma Control  Outcome: Progressing     Problem: Behavioral Health Comorbidity  Goal: Maintenance of Behavioral Health Symptom Control  Outcome: Progressing     Problem: Hypertension Comorbidity  Goal: Blood Pressure in Desired Range  Outcome: Progressing

## 2021-07-29 NOTE — Unmapped (Signed)
Patient alert, oriented  x 4, calm, pleasant and cooperative. Pt denies SI /HI/ AVH. Visible in the milieu interacting with peers. Took scheduled medication and prn tylenol, maalox and trazodone were given with good result. No unsafe behaviors observed.  Care Plan reviewed and 1:1 therapeutic interaction completed.

## 2021-07-29 NOTE — Unmapped (Signed)
Psychiatry      Daily Progress Note      Admit date/time: 07/19/2021  5:08 PM   LOS: 10 days      Assessment:  Alexis Burns is a 56 y.o., White race, Not Hispanic, Latino/a, or Spanish origin ethnicity,  ENGLISH speaking female with a history of Major depressive disorder, multiple suicide attempts, who presents for evaluation of Severe depression, plan for ECT.     Alexis Burns is an ADTC patient since 2019 with a history of depression and anxiety, with past hospitalizations in 2017 at [facility] and admission to crisis unit in 2021 for suicidal ideation. Patient presents as a direct admission from clinic after discussion about initiation of index series of ECT.  She describes lifelong mood symptoms that have acutely worsened in the last year in the setting of worsening health.  Follows with ADT clinic.  Patient has had significant depressed mood, cognitive delays, tearfulness, decreased appetite, poor sleep, passive suicidal ideation which has been managed in the outpatient setting with extensive safety planning.  Patient has noted that these symptoms have lasted for 8 months, however, she does not recall the last time that she was happy.  From patient's husband, there have been times where patient has been happy and appears that life stressors will push patient's into a depressive episode.  Patient's symptoms consistent with major depressive disorder.       Patient has long history of mood symptoms dating back to her 68s, she has been established w/ ADTC since 2019. September 2021 she was admitted to the [facility] for suicidal ideation with a plan to shoot herself. Per outpatient documentation: She has had multiple previous med trials including: past medication trials include Prozac (stopped working), risperidone (brief trial), lithium (intolerable side effects), buspirone (unhelpful at 20mg  TID), duloxetine (90mg  well-tolerated but not helpful on its own), aripiprazole (as adjunct for duloxetine, eventually stopped due to tremors), Wellbutrin (anxiety, dry mouth), Seroquel (ineffective), Cytomel (dry mouth, joint pain).  Most recently, she is taking Pristiq 100 mg and Klonopin PRN for anxiety.  She is on the ECT schedule for MWF.      At this time, we will initiate sertraline to target her depressive symptoms in addition to ECT. Given her liver disease, will not titrate above 100mg  daily.  Patient started on Sertraline 25 mg on 5/25, will increase to 50 mg on 5/28 as long as patient tolerates it.   MMSE on 5/25 was 27/30 (3 point lost for memory), BDI on 5/25 was 30 (down from 51 on 07/20/21). ECT today 5/26.      Patient started with L calf pain on morning of 5/25.  Patient describes it as a muscle cramp that occurs whenever she gets up and walks and lingers.  No redness, swelling, warmth to area is reassuring.  No shortness of breath or chest pain.  However, given unilateral nature of pain, would like to rule out DVT so will order PVLs.      As of 07/29/2021, continued hospitalization is warranted given severe major depressive symptom burden, suicidal ideation, continued treatment with ECT.      Diagnoses:   Active Problems:    Severe episode of recurrent major depressive disorder, 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)    Plan:  Safety:  -- Continue admission to inpatient psychiatric unit for safety, stabilization, and treatment.   -- Admission status: Voluntary  -- On unit/off unit level of supervision: q15 min checks / Restrict to Unit  -- Medical  equipment: The treatment team has determined that this patient requires the continued use of medical equipment  walker for their care and healing. Specific medical rationale for need for equipment: impaired mobility and fall risk,    Psychiatry:  # Major Depressive Disorder   -- Continue ECT MWF    -- Continue Sertraline 25 mg daily (s5/25), plan to increase to 50 mg on 5/28  -- s/p Effexor XR (disc 5/23) 75 mg due to SE's  -- 07/20/21 MMSE score was 25, 23, BDI score was 51; repeat MMSE 07/26/21 was 27, BDI 07/26/21 was 30  -- MMSE weekly on Tues/Thurs, BDI on Thurs  -- TSH wnl     # Anxiety  -- Stop home klonopin (0.5 mg in AM, lunch and 1 mg at bedtime)  -- HOLD hydroxyzine 25 mg PRN for anxiety    # Insomnia  -- Trazodone 25 mg at bedtime PRN for sleep     Medical:  # 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  Occurs when she walks and lingers. No shortness of breath, no chest pain, no redness, warmth to leg.  -- PVLs ordered    -- Metabolic Monitoring:   Not indicated      Social/Disposition:  -- Continue hospitalization at this time.   -- Primary team to follow up with family, outpatient resources    WEEKEND ROUNDS   07/28/21 PT Seen chart reviewed, continues treatment plan as above, but will titrate zoloft to 50mg , has not had se or ae to the 25mg  , also confirm hydroxyzine written for tid , as she notes anxiety (had been on klonopin at home)    07/29/21 pt seen , reviewed chart, no change of tx plan     [name] MD    Subjective:    Hours of sleep overnight :   8.75 hrs.         Per 5/28 /23 RN Epic note:   Patient alert, oriented  x 4, calm, pleasant and cooperative. Pt denies SI /HI/ AVH. Visible in the milieu interacting with peers. Took scheduled medication and prn tylenol, maalox and trazodone were given with good result. No unsafe behaviors observed.  Care Plan reviewed and 1:1 therapeutic interaction completed.    MD interview:  Seen in her room, sleeping but awakened and alert. Indicates slept well last night. Denies si intent or plan  Does not voice any c/o or concerns today      Objective:    Vitals:  No data found.    Mental Status Exam:    Appearance:    Well nourished, Well developed and Clean/Neat   Motor:   No abnormal movements   Speech/Language:    Normal rate, volume, tone, fluency   Mood:    Appears improved    Affect:   fair  range/reactivity    Thought process:   Logical, linear, clear, coherent, goal directed   Thought content:  Denies active SI   Perceptual disturbances:     Denies auditory and visual hallucinations, behavior not concerning for response to internal stimuli     Insight:     Fair   Judgment:    Fair   Impulse Control:   Fair   Other:         PE:   Gen: No acute distress, well developed and well nourished  Resp: Normal work of breathing      Vital Signs:   Vitals:    07/28/21 1939   BP: 117/71   Pulse: 82   Resp:    Temp:    SpO2: 100%       Test Results:  Data Review:   Lab results last 24 hours:    No results found for this or any previous visit (from the past 24 hour(s)).  Imaging: None    Psychometrics:  PHQ-9 PHQ-9 TOTAL SCORE   07/22/2021   8:00 PM 26   07/19/2021   7:23 PM 26   06/18/2021   5:00 PM 24   06/05/2021   9:00 AM 26   11/11/2019   8:00 PM 13         Social Determinants of Health with Concerns     Internet Connectivity: Not on file   Tobacco Use: Medium Risk   ??? Smoking Tobacco Use: Former   ??? Smokeless Tobacco Use: Never   ??? Passive Exposure: Not on file   Substance Use: Not on file   Physical Activity: Not on file   Interpersonal Safety: Not on file   Stress: Not on file   Intimate Partner Violence: Not on file   Social Connections: Not on file        ---  Time-based billing disclaimer:

## 2021-07-30 MED ADMIN — hydrOXYzine (ATARAX) tablet 10 mg: 10 mg | ORAL | @ 18:00:00

## 2021-07-30 MED ADMIN — acetaminophen (TYLENOL) tablet 650 mg: 650 mg | ORAL | @ 23:00:00

## 2021-07-30 MED ADMIN — sertraline (ZOLOFT) tablet 50 mg: 50 mg | ORAL | @ 13:00:00

## 2021-07-30 MED ADMIN — hydrOXYzine (ATARAX) tablet 10 mg: 10 mg | ORAL | @ 02:00:00

## 2021-07-30 MED ADMIN — aluminum-magnesium hydroxide-simethicone (MAALOX MAX) 80-80-8 mg/mL oral suspension: 30 mL | ORAL | @ 18:00:00

## 2021-07-30 MED ADMIN — acetaminophen (TYLENOL) tablet 650 mg: 650 mg | ORAL | @ 02:00:00

## 2021-07-30 MED ADMIN — traZODone (DESYREL) tablet 25 mg: 25 mg | ORAL | @ 02:00:00

## 2021-07-30 MED ADMIN — rifAXIMin (XIFAXAN) tablet 550 mg: 550 mg | ORAL | @ 13:00:00 | Stop: 2021-08-18

## 2021-07-30 MED ADMIN — rifAXIMin (XIFAXAN) tablet 550 mg: 550 mg | ORAL | @ 02:00:00 | Stop: 2021-08-18

## 2021-07-30 MED ADMIN — lidocaine (LIDODERM) 5 % patch 2 patch: 2 | TRANSDERMAL | @ 02:00:00

## 2021-07-30 MED ADMIN — aluminum-magnesium hydroxide-simethicone (MAALOX MAX) 80-80-8 mg/mL oral suspension: 30 mL | ORAL | @ 02:00:00

## 2021-07-30 MED ADMIN — hydrOXYzine (ATARAX) tablet 10 mg: 10 mg | ORAL | @ 13:00:00
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
## 2021-07-30 NOTE — Unmapped (Signed)
Alexis Burns remains pleasant, cooperative, social with peers in the dayroom.  Compliant with medications.  Walker at bedside however she has been ambulating without it.  Reminded there is no ECT Monday to which she verbalized understanding.  Denies SI/HI/AH/VH, no unsafe behaviors noted.    POC reviewed/updated    PHQ-9 PHQ-9 TOTAL SCORE  07/29/2021   9:20 PM 15      Problem: Adult Behavioral Health Plan of Care  Goal: Plan of Care Review  Outcome: Progressing  Goal: Patient-Specific Goal (Individualization)  Outcome: Progressing  Flowsheets (Taken 07/30/2021 0348)  Patient-Specific Goals (Include Timeframe): have ECT  Goal: Adheres to Safety Considerations for Self and Others  Outcome: Progressing  Goal: Absence of New-Onset Illness or Injury  Outcome: Progressing  Goal: Optimized Coping Skills in Response to Life Stressors  Outcome: Progressing  Goal: Develops/Participates in Therapeutic Alliance to Support Successful Transition  Outcome: Progressing  Goal: Rounds/Family Conference  Outcome: Progressing     Problem: Impaired Wound Healing  Goal: Optimal Wound Healing  Intervention: Promote Wound Healing  Recent Flowsheet Documentation  Taken 07/29/2021 2200 by Carolin Guernsey, RN  Activity Management: activity adjusted per tolerance     Problem: Fall Injury Risk  Goal: Absence of Fall and Fall-Related Injury  Outcome: Progressing  Intervention: Promote Scientist, clinical (histocompatibility and immunogenetics) Documentation  Taken 07/29/2021 2200 by Carolin Guernsey, RN  Safety Interventions:   assistive device   fall reduction program maintained   environmental modification   low bed   nonskid shoes/slippers when out of bed     Problem: Suicide Risk  Goal: Absence of Self-Harm  Outcome: Progressing  Intervention: Assess Risk to Self and Maintain Safety  Recent Flowsheet Documentation  Taken 07/29/2021 2130 by Carolin Guernsey, RN  Behavior Management: boundaries reinforced     Problem: Asthma Comorbidity  Goal: Maintenance of Asthma Control  Outcome: Progressing     Problem: Behavioral Health Comorbidity  Goal: Maintenance of Behavioral Health Symptom Control  Outcome: Progressing     Problem: Hypertension Comorbidity  Goal: Blood Pressure in Desired Range  Outcome: Progressing

## 2021-07-31 MED ADMIN — lactulose (CEPHULAC) packet 6 g: 5 g | ORAL | @ 17:00:00

## 2021-07-31 MED ADMIN — ibuprofen (MOTRIN) tablet 400 mg: 400 mg | ORAL | @ 15:00:00 | Stop: 2021-07-31

## 2021-07-31 MED ADMIN — calcium carbonate (TUMS) chewable tablet 200 mg of elem calcium: 200 mg | ORAL | @ 18:00:00 | Stop: 2021-07-31

## 2021-07-31 MED ADMIN — SUMAtriptan (IMITREX) tablet 25 mg: 25 mg | ORAL | @ 05:00:00 | Stop: 2021-07-31

## 2021-07-31 MED ADMIN — sertraline (ZOLOFT) tablet 50 mg: 50 mg | ORAL | @ 13:00:00

## 2021-07-31 MED ADMIN — hydrOXYzine (ATARAX) tablet 10 mg: 10 mg | ORAL | @ 01:00:00

## 2021-07-31 MED ADMIN — acetaminophen (TYLENOL) tablet 650 mg: 650 mg | ORAL | @ 05:00:00

## 2021-07-31 MED ADMIN — calcium carbonate (TUMS) chewable tablet 200 mg of elem calcium: 200 mg | ORAL | @ 22:00:00

## 2021-07-31 MED ADMIN — rifAXIMin (XIFAXAN) tablet 550 mg: 550 mg | ORAL | @ 01:00:00 | Stop: 2021-08-18

## 2021-07-31 MED ADMIN — lidocaine (LIDODERM) 5 % patch 2 patch: 2 | TRANSDERMAL | @ 01:00:00

## 2021-07-31 MED ADMIN — rifAXIMin (XIFAXAN) tablet 550 mg: 550 mg | ORAL | @ 13:00:00 | Stop: 2021-08-18

## 2021-07-31 NOTE — Unmapped (Signed)
Psychiatry      Daily Progress Note      Admit date/time: 07/19/2021  5:08 PM   LOS: 12 days      Assessment:  Alexis Burns is a 56 y.o., White race, Not Hispanic, Latino/a, or Spanish origin ethnicity,  ENGLISH speaking female with a history of Major depressive disorder, multiple suicide attempts, who presents for evaluation of Severe depression, plan for ECT.     Alexis Burns is an ADTC patient since 2019 with a history of depression and anxiety, with past hospitalizations in 2017 at [facility] and admission to crisis unit in 2021 for suicidal ideation. Patient presents as a direct admission from clinic after discussion about initiation of index series of ECT.  She describes lifelong mood symptoms that have acutely worsened in the last year in the setting of worsening health.  Follows with ADT clinic.  Patient has had significant depressed mood, cognitive delays, tearfulness, decreased appetite, poor sleep, passive suicidal ideation which has been managed in the outpatient setting with extensive safety planning.  Patient has noted that these symptoms have lasted for 8 months, however, she does not recall the last time that she was happy.  From patient's husband, there have been times where patient has been happy and appears that life stressors will push patient's into a depressive episode.  Patient's symptoms consistent with major depressive disorder.       Patient has long history of mood symptoms dating back to her 49s, she has been established w/ ADTC since 2019. September 2021 she was admitted to the [facility] for suicidal ideation with a plan to shoot herself. Per outpatient documentation: She has had multiple previous med trials including: past medication trials include Prozac (stopped working), risperidone (brief trial), lithium (intolerable side effects), buspirone (unhelpful at 20mg  TID), duloxetine (90mg  well-tolerated but not helpful on its own), aripiprazole (as adjunct for duloxetine, eventually stopped due to tremors), Wellbutrin (anxiety, dry mouth), Seroquel (ineffective), Cytomel (dry mouth, joint pain).  Most recently, she is taking Pristiq 100 mg and Klonopin PRN for anxiety.  She is on the ECT schedule for MWF.      At this time, we will initiate sertraline to target her depressive symptoms in addition to ECT. Given her liver disease, will not titrate above 100mg  daily.  Patient started on Sertraline 25 mg on 5/25, will increase to 50 mg on 5/28 as long as patient tolerates it.   MMSE on 5/25 was 27/30 (3 point lost for memory), BDI on 5/25 was 30 (down from 51 on 07/20/21). ECT today 5/26.      Patient started with L calf pain on morning of 5/25.  Patient describes it as a muscle cramp that occurs whenever she gets up and walks and lingers.  No redness, swelling, warmth to area is reassuring.  No shortness of breath or chest pain.  However, given unilateral nature of pain, would like to rule out DVT so will order PVLs.      As of 07/31/2021, continued hospitalization is warranted given severe major depressive symptom burden, suicidal ideation, continued treatment with ECT.        Diagnoses:   Active Problems:    Severe episode of recurrent major depressive disorder, 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)    Plan:  Safety:  -- Continue admission to inpatient psychiatric unit for safety, stabilization, and treatment.   -- Admission status: Voluntary  -- On unit/off unit level of supervision: q15 min checks / 1:1 (staff:patient)  --  Medical equipment: The treatment team has determined that this patient requires the continued use of medical equipment  walker for their care and healing. Specific medical rationale for need for equipment: impaired mobility and fall risk,    Psychiatry:  # Major Depressive Disorder   -- Continue ECT MWF    -- Continue Sertraline 50 mg daily (s5/25, i5/28)  -- s/p Effexor XR (disc 5/23) 75 mg due to SE's  -- 07/20/21 MMSE score was 25, 23, BDI score was 51; 07/26/21 MMSE  was 27 and  BDI was 30, Today's  MMSE on 07/31/21 was 27.  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  Occurs when she walks and lingers. No shortness of breath, no chest pain, no redness, warmth to leg.  -- PVLs negative for clot    #Indigestion  - starting calcium carbonate TUMS , 200 mg TID w/ meals    -- Metabolic Monitoring:   Not indicated    Social/Disposition:  -- Continue hospitalization at this time.   -- Primary team to follow up with family, outpatient resources    [name], MS3    I attest that I have reviewed the medical student note and that the components of the history of the present illness, the physical exam, and the assessment and plan documented were performed by me or were performed in my presence by the student where I verified the documentation and performed (or re-performed) the exam and medical decision making. Maryjo Rochester, MD, PGY-1      Subjective:    Hours of sleep overnight :   8.25 hrs.     Per 07/31/21 RN Epic note:   Received pt in the dayroom watching TV and socializes with selected peers. Pt is pleasant, open and engaging during conversation with this Clinical research associate. Pt endorsed feeling  angry today because nothing happened for 4 days (ECT) and she could have been home . After expressing disappointment pt was okay. She is med compliant. Denies SI/HI. No unsafe behavior observed. All needs attended. Will continue to monitor and provide supportive care. Care plan reviewed and 1:1 therapeutic interventions completed.     MD interview:  Had a rough night because of migraine , usually happens once a year. Reports sumatriptan helped but head still sore. Reports a lot of triggers this weekend, and spent a lot of the weekend in tears. Does not know if sad or anxious. Reports missed five days of home life I could have used  due to ECT schedule changing over the  holiday weekend. States her husband came to see her, and it went really well as he is my rock after 37 years.     She reports having indigestion  and spent most of weekend  in bed due to scheduled atarax times a day. Reports still having nightmares. Reports thinking about the past and being mad about it as does not feel has as much support from her family other than her husband, and was triggered by seeing another patient's family visit and support them. Denies SI. Discussed adding some motrin for headache and continuing ECT today because of Holiday weekend.  Also reported having constipation, but not using the PRN lactulose for a couple of days. Discussed using it today. Denies SI.    Collateral: Beth,[name] (Spouse) [phone]   Talked to pt husband who saw her over the weekend. He states when he saw pt on Saturday she was doing good, was talkative and cheerful but was a little groggy from one of the medications, which was atarax. He states he talked to her on the phone yesterday, and pt had mentioned watching one of her peers on the unit and seeing them triggered some of her old sad memories.. He denies her having any SI. He states he believes the ECT is working really well and has no concerns. Discussed patient medication increase of Zoloft and changing the atarax to PRN.    Objective:    Vitals:  Patient Vitals for the past 12 hrs:   BP Temp Pulse Resp SpO2   07/31/21 0839 116/80 35.7 ??C (96.3 ??F) 88 18 97 %       Mental Status Exam:    Appearance:    Well nourished, Well developed and Clean/Neat   Motor:   No abnormal movements   Speech/Language:    Normal rate, volume, tone, fluency   Mood:   Rough night   Affect:   broader range/reactivity    Thought process:   Logical, linear, clear, coherent, goal directed   Thought content:      Denies active SI   Perceptual disturbances:     Denies auditory and visual hallucinations, behavior not concerning for response to internal stimuli     Insight:    Improved    Judgment:    improved    Impulse Control:  Appears improved    Other:         PE:   Gen: No acute distress, well developed and well nourished  Resp: Normal work of breathing      Vital Signs:   Vitals:    07/31/21 0839   BP: 116/80   Pulse: 88   Resp: 18   Temp: 35.7 ??C (96.3 ??F)   SpO2: 97%       Test Results:  Data Review:   Lab results last 24 hours:    No results found for this or any previous visit (from the past 24 hour(s)).  Imaging: None    Psychometrics:  PHQ-9 PHQ-9 TOTAL SCORE   07/29/2021   9:20 PM 15   07/22/2021   8:00 PM 26   07/19/2021   7:23 PM 26   06/18/2021   5:00 PM 24   06/05/2021   9:00 AM 26         Social Determinants of Health with Concerns     Internet Connectivity: Not on file   Tobacco Use: Medium Risk    Smoking Tobacco Use: Former    Smokeless Tobacco Use: Never  Passive Exposure: Not on file   Substance Use: Not on file   Physical Activity: Not on file   Interpersonal Safety: Not on file   Stress: Not on file   Intimate Partner Violence: Not on file   Social Connections: Not on file        ---  Time-based billing disclaimer:  I personally spent 55   minutes face-to-face and non-face-to-face in the care of this patient, which includes all pre, intra, and post visit time on the date of service.  All documented time was specific to the E/M visit and does not include any procedures that may have been performed.

## 2021-07-31 NOTE — Unmapped (Signed)
07/31/21 1300   Activity/Group Checklist   Tx Duration 50 Min.   Group Psychology   Note Type Progress Note   Interventions Evidence-Based Treatment informed by Cognitive Behavior Therapy (CBT)   Attendance Attended   Follows Direction Followed directions   Interactions Interacted appropriately; Pleasant   Mood Mildly Dysthymic   Affect Congruent with Mood    Equipment/Environment   Equipment / Environment Patient wearing mask for full session   Group Leader Observations   Group Leader Observations Alexis Burns (she/her) attended today's Psychology Group.  She engaged in introduction activity and helped identify group rules.  Alexis Burns was an active & appropriate participant throughout group; she left group approx 10 mins early.      Today's group was focused on the impact of sleep on mental & physical health.  Writer provided psychoeducation on sleep and Sleep Hygiene.  Writer also provided brief introduction to CBT for insomnia.  Group members shared their experiences with sleep and insomnia.  Alexis Burns noted that she has experienced both insomnia & hypersomnia throughout her life.       Primary hygiene recommendations reviewed today include: Sleeping in a dark Room, No Electronics, Quiet, Reserving Bed for Sleep, Creating Sleep Schedule, Limiting Naps.       Alexis Burns stated she has very little physical activity throughout the day.  She described moving from the bed to the chair & then back to bed.  When writer expressed importance of even minimal daily movement, Alexis Burns appeared to become mildly frustrated.  She left group early.

## 2021-07-31 NOTE — Unmapped (Signed)
[facility]  Psychiatry  Treatment Plan      Admit Date/Time:  07/19/2021  5:08 PM  LOS:  LOS: 12 days   Treatment Team: Treatment Team: Attending Provider: Bryn Gulling, MD; Occupational Therapist: Lars Masson, OT; Recreational Therapist: Almyra Brace Roy-Farrug, LRT/CTRS ; Case Manager: Kittie Plater, MSW; Occupational Therapist: Sherryll Burger, OT; Utilization Management: Laveda Abbe, RN; Patient Care Tech: Dominga Ferry, CNA; Medical Student: Cecilie Lowers, CNA; Registered Nurse: Lorayne Bender, RN; Resident: Maryjo Rochester, MD    Diagnoses:  Active Problems:    Severe episode of recurrent major depressive disorder, 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     Relevant Aspects of Hospital course: Patient presented as a direct admission from clinic after discussion about initiation of index series of ECT.  She describes lifelong mood symptoms that have acutely worsened in the last year in the setting of worsening health.  Follows with ADT clinic.  Patient has had significant depressed mood, cognitive delays, tearfulness, decreased appetite, poor sleep, passive suicidal ideation which has been managed in the outpatient setting with extensive safety planning.  She has completed 5 sessions of ECT since admission here.     Patient Strengths:   Support, Stable living environment, Employment history, Positive communication skills       DISCHARGE GOALS:  Safe disposition for current level of functioning  Appropriate aftercare in place  Not requiring inpatient level of care for medical/psychiatric conditions    THERAPEUTIC GOALS:    Nursing Goals:  Problems & Goals: (Active)       Problem: Adult Behavioral Health Plan of Care       Goal: Plan of Care Review          Goal: Patient-Specific Goal (Individualization)          Goal: Adheres to Safety Considerations for Self and Others          Goal: Absence of New-Onset Illness or Injury          Goal: Optimized Coping Skills in Response to Life Stressors          Goal: Develops/Participates in Therapeutic Alliance to Support Successful Transition          Goal: Rounds/Family Conference             Problem: Asthma Comorbidity       Goal: Maintenance of Asthma Control             Problem: Behavioral Health Comorbidity       Goal: Maintenance of Behavioral Health Symptom Control             Problem: Fall Injury Risk       Goal: Absence of Fall and Fall-Related Injury             Problem: Hypertension Comorbidity       Goal: Blood Pressure in Desired Range             Problem: Suicide Risk  Goal: Absence of Self-Harm              Recreational Therapy Goals:  Goal(s):  1.  By 07/27/21, patient will demonstrate improved motivation for inpatient treatment by participating in at least 2 RT sessions for 15+ minutes/session.    Interventions: Coping skills, Group initiatives   2.  By 08/03/21, patient will demonstrate 3 positive coping skills with no more than 2 cues required/occasion, for use in managing suicidal ideation, demonstrating improved ability to maintain safety outside the hospital setting.        3. By 08/03/21, patient will verbalize plan to implement 2 healthy coping skills for use prn by patient to maintain safety post discharge.                 Interventions(s): Motivators: Unable to Identify motivators  Patient's Identified Treatment Goal: Just to get through these treatments and see if they help me.  Patient's Stressors / Triggers: Patient presents with suicidal ideation with plan to shoot self.  Patient's reproted stressors are: 1 adult son doesn't speak to her, patient has been participating in therapy, which has brought up childhood traumas and patient's sisters do not believe the patient, patient with a history of multiple suicide attempts.  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  Unclear patient's motivation for inpatient treatment at time of evaluation.  Treatment Plan developed in collaboration with: Patient, Treatment Team  Treatment Plan:  1x per day for: 3-4x week   Planned Treatment Duration      Occupational Therapy Goals:  Goal(s):  By 6/9, Pt will demonstrate the use of 4 calming strategies for improved emotional regulation.    Time Frame : 3 weeks   By 6/9/, Pt will identify 3 relapse prevention strategies in order to improve their insight, safety, and health management.    Time Frame : 3 weeks   By 6/9, Pt will develop x 3 strategies to manage health related challenges  with compensatory strategies to increase satisfaction in life roles    Time Frame : 3 weeks                    Interventions(s): Equities trader, ADL retraining, Education - Patient, Relapse prevention, Health management training, Emotion regulation Skills, Functional cognition  Treatment Plan: 1-2x per day, for: 3-4x week while in acute setting for      MD Treatment Goals: Improvement in mood.   MD Treatment Frequency: Daily .   Recommended Treatment: Group therapy, Pharmacotherapy:    Current Facility-Administered Medications   Medication Dose Route Frequency Provider Last Rate Last Admin    acetaminophen (TYLENOL) tablet 650 mg  650 mg Oral Q6H PRN Estelle Grumbles, MD   650 mg at 07/31/21 0046    aluminum-magnesium hydroxide-simethicone (MAALOX MAX) 80-80-8 mg/mL oral suspension  30 mL Oral Q6H PRN Estelle Grumbles, MD   30 mL at 07/30/21 1420    hydrOXYzine (ATARAX) tablet 25 mg  25 mg Oral Q6H PRN Maryjo Rochester, MD        lactulose (CEPHULAC) packet 6 g  6 g Oral Daily PRN Latrelle Dodrill, MD        lidocaine (LIDODERM) 5 % patch 2 patch  2 patch Transdermal Daily Azzie Roup, MD   2 patch at 07/30/21 2102    rifAXIMin (XIFAXAN) tablet 550 mg  550 mg Oral BID Estelle Grumbles, MD   [id number]  mg at 07/31/21 0833    sertraline (ZOLOFT) tablet 50 mg  50 mg Oral Daily Ellamae Sia, MD   50 mg at 07/31/21 1610    traZODone (DESYREL) tablet 25 mg  25 mg Oral Nightly PRN Marthann Schiller, MD   25 mg at 07/29/21 2130   , Milieu therapy, Behavioral Health evaluation/assessment process will continue.  Discharge Plan: Return to previous living arrangement.   Barriers to Discharge: Continued disabling psychiatric symptoms: depressed mood .    MD assessment of inpatient progress: I have considered the patient's multidisciplinary goals, relevant hospital course, engagement in interventions, clinical presentation, and relevant psychometrics in a global assessment of treatment progress.  Collectively, the patient is progressing. Will continue to trend patient's progress through weekly multidisciplinary treatment planning..    Clinical Expected Discharge Date: 08/03/2021    Treatment Plan Developed in Collaboration with: Patient  and Treatment team    Treatment Plan Created/Updated by: Maryjo Rochester    Patient was seen and plan of care was discussed with the Attending MD, Dr. Perrin Maltese , who agrees with the above statement and plan.

## 2021-07-31 NOTE — Unmapped (Signed)
Alexis Burns was ordered Lactulose 0.6 grams which she reported as being very effective. She was able to take her medications as scheduled this shift. The patient denied SI/HI/AVH and no behavioral incidents this shift. The patient interacted well with her peers and attended groups. Staff will continue to monitor the patient.  Problem: Adult Behavioral Health Plan of Care  Goal: Plan of Care Review  Outcome: Progressing  Flowsheets (Taken 07/31/2021 1536)  Plan of Care Reviewed With: patient  Patient Agreement with Plan of Care: agrees  Goal: Patient-Specific Goal (Individualization)  Outcome: Progressing  Flowsheets (Taken 07/31/2021 1536)  Patient-Specific Goals (Include Timeframe): Get discharged and go home  Patient Personal Strengths:   medication/treatment adherence   expressive of emotions   expressive of needs  Goal: Adheres to Safety Considerations for Self and Others  Outcome: Progressing  Goal: Absence of New-Onset Illness or Injury  Outcome: Progressing  Intervention: Prevent Infection  Recent Flowsheet Documentation  Taken 07/31/2021 0908 by Lorayne Bender, RN  Infection Prevention: hand hygiene promoted  Goal: Optimized Coping Skills in Response to Life Stressors  Outcome: Progressing  Goal: Develops/Participates in Therapeutic Alliance to Support Successful Transition  Outcome: Progressing  Goal: Rounds/Family Conference  Outcome: Progressing  Flowsheets (Taken 07/31/2021 1536)  Participants:   Milieu/Psych techs   nursing   psychiatrist   dietitian/nutrition services   pharmacy   patient   social work   occupational therapy     Problem: Fall Injury Risk  Goal: Absence of Fall and Fall-Related Injury  Outcome: Progressing  Intervention: Promote Scientist, clinical (histocompatibility and immunogenetics) Documentation  Taken 07/31/2021 0908 by Lorayne Bender, RN  Safety Interventions:   low bed   nonskid shoes/slippers when out of bed     Problem: Suicide Risk  Goal: Absence of Self-Harm  Outcome: Progressing  Intervention: Assess Risk to Self and Maintain Safety  Recent Flowsheet Documentation  Taken 07/31/2021 0908 by Lorayne Bender, RN  Behavior Management:   behavioral plan reviewed   boundaries reinforced     Problem: Asthma Comorbidity  Goal: Maintenance of Asthma Control  Outcome: Progressing     Problem: Behavioral Health Comorbidity  Goal: Maintenance of Behavioral Health Symptom Control  Outcome: Progressing     Problem: Hypertension Comorbidity  Goal: Blood Pressure in Desired Range  Outcome: Progressing

## 2021-07-31 NOTE — Unmapped (Signed)
Received pt in the dayroom watching TV and socializes with selected peers. Pt is pleasant, open and engaging during conversation with this Clinical research associate. Pt endorsed feeling  angry today because nothing happened for 4 days (ECT) and she could have been home . After expressing disappointment pt was okay. She is med compliant. Denies SI/HI. No unsafe behavior observed. All needs attended. Will continue to monitor and provide supportive care. Care plan reviewed and 1:1 therapeutic interventions completed.

## 2021-08-01 MED ADMIN — succinylcholine (ANECTINE) injection: INTRAVENOUS | @ 14:00:00 | Stop: 2021-08-01

## 2021-08-01 MED ADMIN — ondansetron (ZOFRAN) injection: INTRAVENOUS | @ 14:00:00 | Stop: 2021-08-01

## 2021-08-01 MED ADMIN — rifAXIMin (XIFAXAN) tablet 550 mg: 550 mg | ORAL | @ 16:00:00 | Stop: 2021-08-18

## 2021-08-01 MED ADMIN — sertraline (ZOLOFT) tablet 50 mg: 50 mg | ORAL | @ 16:00:00

## 2021-08-01 MED ADMIN — promethazine (PHENERGAN) injection: INTRAVENOUS | @ 14:00:00 | Stop: 2021-08-01

## 2021-08-01 MED ADMIN — calcium carbonate (TUMS) chewable tablet 200 mg of elem calcium: 200 mg | ORAL | @ 16:00:00

## 2021-08-01 MED ADMIN — sodium chloride (NS) 0.9 % infusion: INTRAVENOUS | @ 14:00:00 | Stop: 2021-08-01

## 2021-08-01 MED ADMIN — ibuprofen (MOTRIN) tablet 400 mg: 400 mg | ORAL | @ 17:00:00

## 2021-08-01 MED ADMIN — traZODone (DESYREL) tablet 25 mg: 25 mg | ORAL | @ 01:00:00

## 2021-08-01 MED ADMIN — rifAXIMin (XIFAXAN) tablet 550 mg: 550 mg | ORAL | @ 01:00:00 | Stop: 2021-08-18

## 2021-08-01 MED ADMIN — calcium carbonate (TUMS) chewable tablet 200 mg of elem calcium: 200 mg | ORAL | @ 22:00:00

## 2021-08-01 MED ADMIN — acetaminophen (TYLENOL) tablet 650 mg: 650 mg | ORAL | @ 22:00:00

## 2021-08-01 MED ADMIN — traZODone (DESYREL) tablet 25 mg: 25 mg | ORAL | @ 02:00:00 | Stop: 2021-07-31

## 2021-08-01 MED ADMIN — acetaminophen (TYLENOL) tablet 650 mg: 650 mg | ORAL | @ 01:00:00

## 2021-08-01 MED ADMIN — caffeine citrate (CAFCIT) injection: INTRAVENOUS | @ 14:00:00 | Stop: 2021-08-01

## 2021-08-01 MED ADMIN — propofoL (DIPRIVAN) injection: INTRAVENOUS | @ 14:00:00 | Stop: 2021-08-01

## 2021-08-01 NOTE — Unmapped (Addendum)
Alexis Burns has been visible in the milieu . Minimal interaction with peers observed. Her mood is sad , affect constricted with good eye contact. She received ECT treatment this morning and tolerated it well . She is medication compliant .She received Motrin 400mg  prn for a headache 3/10 with good response.  She is tolerating meals . She ambulates with r.w. She denies s/h/I , a/v/h. Plan of care reviewed . 1:1 interaction completed.

## 2021-08-01 NOTE — Unmapped (Signed)
Psychiatry      Daily Progress Note      Admit date/time: 07/19/2021  5:08 PM   LOS: 13 days      Assessment:  Alexis Burns is a 56 y.o., White race, Not Hispanic, Latino/a, or Spanish origin ethnicity,  ENGLISH speaking female with a history of Major depressive disorder, multiple suicide attempts, who presents for evaluation of Severe depression, plan for ECT.     Alexis Burns is an [facility] patient since 2019 with a history of depression and anxiety, with past hospitalizations in 2017 at [facility] and admission to crisis unit in 2021 for suicidal ideation. Patient presents as a direct admission from clinic after discussion about initiation of index series of ECT.  She describes lifelong mood symptoms that have acutely worsened in the last year in the setting of worsening health.  Follows with ADT clinic.  Patient has had significant depressed mood, cognitive delays, tearfulness, decreased appetite, poor sleep, passive suicidal ideation which has been managed in the outpatient setting with extensive safety planning.  Patient has noted that these symptoms have lasted for 8 months, however, she does not recall the last time that she was happy.  From patient's husband, there have been times where patient has been happy and appears that life stressors will push patient's into a depressive episode.  Patient's symptoms consistent with major depressive disorder.       Patient has long history of mood symptoms dating back to her 50s, she has been established w/ [facility] since 2019. September 2021 she was admitted to the [facility] for suicidal ideation with a plan to shoot herself. Per outpatient documentation: She has had multiple previous med trials including: past medication trials include Prozac (stopped working), risperidone (brief trial), lithium (intolerable side effects), buspirone (unhelpful at 20mg  TID), duloxetine (90mg  well-tolerated but not helpful on its own), aripiprazole (as adjunct for duloxetine, eventually stopped due to tremors), Wellbutrin (anxiety, dry mouth), Seroquel (ineffective), Cytomel (dry mouth, joint pain).  Most recently, she is taking Pristiq 100 mg and Klonopin PRN for anxiety.  She is on the ECT schedule for MWF.      At this time, we will initiate sertraline to target her depressive symptoms in addition to ECT. Given her liver disease, will not titrate above 100mg  daily.  Patient started on Sertraline 25 mg on 5/25, increased to 50 mg on 5/28.   MMSE on 5/30 was 27/30 (3 point lost for memory), BDI on 5/25 was 30 (down from 51 on 07/20/21). Pt had 5th ECT treatment today on 08/01/21 and tolerated it well.      Patient started with L calf pain on morning of 5/25.  Patient describes it as a muscle cramp that occurs whenever she gets up and walks and lingers.  No redness, swelling, warmth to area is reassuring.  No shortness of breath or chest pain.  Bilateral lower extremity DVT's with no clots.      As of 08/01/2021, continued hospitalization is warranted given severe major depressive symptom burden, suicidal ideation, continued treatment with ECT.        Diagnoses:   Active Problems:    Severe episode of recurrent major depressive disorder, 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)    Plan:  Safety:  -- Continue admission to inpatient psychiatric unit for safety, stabilization, and treatment.   -- Admission status: Voluntary  -- On unit/off unit level of supervision: q15 min checks / 1:1 (staff:patient)  -- Medical equipment: The treatment team has determined  that this patient requires the continued use of medical equipment  walker for their care and healing. Specific medical rationale for need for equipment: impaired mobility and fall risk,    Psychiatry:  # Major Depressive Disorder   -- Continue ECT MWF    -- Continue Sertraline 50 mg daily (s5/25, i5/28)  -- s/p Effexor XR (disc 5/23) 75 mg due to SE's  -- 07/20/21 MMSE score was 25, 23, BDI score was 51; 07/26/21 MMSE  was 27 and  BDI was 30, Today's  MMSE on 07/31/21 was 27.  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  Occurs when she walks and lingers. No shortness of breath, no chest pain, no redness, warmth to leg.  -- PVLs negative for clot    #Indigestion  - starting calcium carbonate TUMS , 200 mg TID w/ meals    -- Metabolic Monitoring:   Not indicated      Social/Disposition:  -- Continue hospitalization at this time.   -- Primary team to follow up with family, outpatient resources    [name], MS3    I attest that I have reviewed the medical student note and that the components of the history of the present illness, the physical exam, and the assessment and plan documented were performed by me or were performed in my presence by the student where I verified the documentation and performed (or re-performed) the exam and medical decision making.     Maryjo Rochester, MD, PGY-1      Subjective:    Hours of sleep overnight :   7.25 hrs.     Per 08/01/21 RN Epic note:   Care plan reviewed.  Pt calm and cooperative during evening.  Denied SI/AVH/HI.  Given Tylenol for headache.  Given PRN Trazodone for sleep.  Compliant with medication.  Refused pain patch.  Pt NPO after midnight.  In dayroom with peers.  No unsafe behaviors noted.  Continue to monitor.    MD interview:  Reports morning was fine and is ready for bed after her ECT this morning. Yesterday was good as she hanged out with people in the unit and talked to her husband , which went very well. Took the lactulose yesterday and had a partial bowel movement. Reports BM have been better the last few days. Had a headaches, but no dizziness.  Reports has had some difficulty with hearing since she has been here , but states it noisy in the unit and maybe the acoustics in the unit. Has not noticed any changes  yet with the Setraline . Denies SI. Feels good about discharging at end of week following ECT treatment.     Objective:    Vitals:  Patient Vitals for the past 12 hrs:   BP Temp Temp src Pulse Resp SpO2   08/01/21 1035 137/75 -- -- 84 18 98 %   08/01/21 1034 -- -- -- 97 16 93 %   08/01/21 1030 176/94 -- -- 111 18 97 %   08/01/21 1028 154/98 -- -- 82 17 99 %   08/01/21 1027 138/88 36.6 ??C (97.9 ??F) Temporal 79 16 98 %   08/01/21 0800 115/67 35.9 ??C (96.6 ??F) -- 72 18 100 %       Mental Status Exam:    Appearance:    Well nourished, Well developed and Clean/Neat   Motor:   No abnormal movements   Speech/Language:    Normal rate, volume, tone, fluency   Mood:   Fine   Affect:   appears tired after ECT   Thought process:   Logical, linear, clear, coherent, goal directed   Thought content:      Denies active SI   Perceptual disturbances:     Denies auditory and visual hallucinations, behavior not concerning for response to internal stimuli     Insight:    Improved    Judgment:    improved    Impulse Control:  Appears improved    Other:         PE:   Gen: No acute distress, well developed and well nourished  Resp: Normal work of breathing      Vital Signs:   Vitals:    08/01/21 1035   BP: 137/75   Pulse: 84   Resp: 18   Temp:    SpO2: 98%       Test Results:  Data Review:   Lab results last 24 hours:    No results found for this or any previous visit (from the past 24 hour(s)).  Imaging: None    Psychometrics:  PHQ-9 PHQ-9 TOTAL SCORE   07/29/2021   9:20 PM 15   07/22/2021   8:00 PM 26   07/19/2021   7:23 PM 26   06/18/2021   5:00 PM 24   06/05/2021   9:00 AM 26         Social Determinants of Health with Concerns     Internet Connectivity: Not on file   Tobacco Use: Medium Risk    Smoking Tobacco Use: Former    Smokeless Tobacco Use: Never    Passive Exposure: Not on file   Substance Use: Not on file   Physical Activity: Not on file   Interpersonal Safety: Not on file   Stress: Not on file   Intimate Partner Violence: Not on file   Social Connections: Not on file        ---  Time-based billing disclaimer:  I personally spent 50   minutes face-to-face and non-face-to-face in the care of this patient, which includes all pre, intra, and post visit time on the date of service.  All documented time was specific to the E/M visit and does not include any procedures that may have been performed.

## 2021-08-01 NOTE — Unmapped (Signed)
Care plan reviewed.  Pt calm and cooperative during evening.  Denied SI/AVH/HI.  Given Tylenol for headache.  Given PRN Trazodone for sleep.  Compliant with medication.  Refused pain patch.  Pt NPO after midnight.  In dayroom with peers.  No unsafe behaviors noted.  Continue to monitor.

## 2021-08-01 NOTE — Unmapped (Addendum)
Alexis Alexis Burns               Procedure Note                                             Requesting Attending Physician: Alexis Gulling, MD  Admit Date: 07/19/2021     Service Type: Inpatient  Requesting Attending Physician: Alexis Gulling, MD  Service requesting consult: Psychiatry  Consulting service: Psychiatry  Admit Date: 07/19/2021  Service Date: Aug 01, 2021      Time out was taken with staff to confirm correct patient and correct procedure to be performed.    INDICATION FOR Alexis Burns: Mood Disorder: Maj Depress D/O, REC  Severe WITHOUT psychotic behavior    TREATMENT HISTORY:    Total Number of Treatments: 5  Current Treatment #: 5    Treatment Type: Index    Electrode Placement: Left Frontal Right Temporal    RATING SCALES:    CGI-Change score for Alexis Burns series:   CGI-C: 4. No change    Beck Depression Inventory Total Score: 30 51    Mini-Mental Status ExamTotal Score: 27 25     Bush-Francis CatatoniaTotal Score:       MEDICAL INFORMATION:    Allergies:  Haldol [haloperidol lactate], Cyclosporine, Tetracycline, and Tramadol    Medical History:  See Alexis Burns Consult    Surgical History:  See Alexis Burns Consult    VITAL SIGNS:      Pre-procedure:    Vitals:    07/31/21 2043 07/31/21 2045 07/31/21 2048 08/01/21 0800   BP: 134/79 136/97 117/78 115/67   Pulse: 79 91 85 72   Resp: 16   18   Temp: 35.6 ??C (96 ??F)   35.9 ??C (96.6 ??F)   TempSrc: Temporal      SpO2: 99%   100%   Weight:       Height:         Post-stimulus:     Intra-Alexis Burns VS Per Anesthesia for the past 24 hrs:   Pulse BP   08/01/21 1003 80 157/87       THYMATRON:  Alexis Burns # Thymatron Settings (%) Modification Seizure   Length Cuff Duration  (sec) EEG Duration  (sec) Complications Interventions   1 75 Good Adequate 17 20                                                 Medications given during procedure:   Current Facility-Administered Medications   Medication Dose Route Frequency Provider Last Rate Last Admin    acetaminophen (TYLENOL) tablet 650 mg  650 mg Oral Q6H PRN Estelle Grumbles, MD   650 mg at 07/31/21 2113    aluminum-magnesium hydroxide-simethicone (MAALOX MAX) 80-80-8 mg/mL oral suspension  30 mL Oral Q6H PRN Estelle Grumbles, MD   30 mL at 07/30/21 1420    calcium carbonate (TUMS) chewable tablet 200 mg of elem calcium  200 mg of elem calcium Oral 3xd Meals Maryjo Rochester, MD   200 mg of elem calcium at 07/31/21 1740    hydrOXYzine (ATARAX) tablet 25 mg  25 mg Oral Q6H PRN Maryjo Rochester, MD        lactulose (CEPHULAC) packet 6 g  6 g Oral Daily PRN Latrelle Dodrill, MD   6 g at 07/31/21 1320    lidocaine (LIDODERM) 5 % patch 2 patch  2 patch Transdermal Daily Azzie Roup, MD   2 patch at 07/30/21 2102    rifAXIMin (XIFAXAN) tablet 550 mg  550 mg Oral BID Estelle Grumbles, MD   550 mg at 07/31/21 2113    sertraline (ZOLOFT) tablet 50 mg  50 mg Oral Daily Ellamae Sia, MD   50 mg at 07/31/21 1610    traZODone (DESYREL) tablet 25 mg  25 mg Oral Nightly PRN Marthann Schiller, MD   25 mg at 07/31/21 2114     Facility-Administered Medications Ordered in Other Encounters   Medication Dose Route Frequency Provider Last Rate Last Admin    caffeine citrate (CAFCIT) injection   Intravenous PRN (once a day) Quentin Cornwall, CRNA   30 mg at 08/01/21 1009    ondansetron (ZOFRAN) injection   Intravenous PRN (once a day) Quentin Cornwall, CRNA   4 mg at 08/01/21 1003    promethazine (PHENERGAN) injection   Intravenous PRN (once a day) Quentin Cornwall, CRNA   6.25 mg at 08/01/21 1008    sodium chloride (NS) 0.9 % infusion   Intravenous Continuous PRN Quentin Cornwall, CRNA   New Bag at 08/01/21 1004          Neck Collar Used:  Yes     PROGRESS NOTE:     Alexis Alexis Burns is a 56 y.o. who presents for initiation of index series of Alexis Burns as an inpatient. Pt states she's been having headaches and nausea right now. Mentally, she feels better but feels like she still has room to go. She is still emotional but not as bad as before when she got here. Sleep continues to be good. She denies significant change with appetite, concentration, or energy level. She continues to endorse memory issues that is worsening. Informed her that it should improve over time as we space out her treatments. She also endorses worse blurry vision since starting Alexis Burns. She denies other side effects from Alexis Burns. She denies SI.      Mental Status Exam:  Appearance:    Well nourished and Well developed   Behavior:  Calm and Cooperative   Motor:   No abnormal movements   Speech/Language:    Normal rate, volume, tone, fluency and Language intact, well formed   Mood:   I feel better   Affect:   Calm, Constricted, Euthymic, Mood congruent, and somewhat jovial   Thought process:   Logical, linear, clear, coherent, goal directed   Thought content:     No SI   Perceptual disturbances:     Denies auditory and visual hallucinations, behavior not concerning for response to internal stimuli     Orientation:   Oriented to person, place, time, and general circumstances   Attention:   Able to fully attend without fluctuations in consciousness   Concentration:   Able to fully concentrate and attend   Memory:    MMSE 25/30    Fund of knowledge:    Consistent with level of education and development   Insight:     Fair   Judgment:    Fair   Impulse Control:   Fair     Physical Exam:   Gen: NAD  Pulm: No increased work of breathing  Neuro: No tremors, tics noted, no abnormal movements    A/P: [place]  Alexis Alexis Burns is a 56 y.o. who presents for initiation of index series of Alexis Burns as an inpatient for treatment of major depressive disorder, recurrent, severe, without psychotic features.     Currently with LFRT placement and does report good response. Today she is jovial and appears euthymic, but feels she has more room for improvement.    Post-Treatment Complications: None    Recommendations for Future Treatments:  Increase energy to 80-85%.    Next Treatment Date: 08/03/21    Meds to take prior to Alexis Burns: None    Special Instructions: Do not take these medications after 6pm on the night before Alexis Burns: Klonopin/clonazepam (prescribed as an outpatient but not currently prescribed inpatient)    Assisted By: Thornell Sartorius, MD    Iran Planas, MD

## 2021-08-02 MED ORDER — CARBOXYMETHYLCELLULOSE SODIUM 0.25 % EYE DROPS
Freq: Four times a day (QID) | OPHTHALMIC | 0 refills | 38 days
Start: 2021-08-02 — End: ?

## 2021-08-02 MED ORDER — HYDROXYZINE HCL 25 MG TABLET
ORAL_TABLET | Freq: Every day | ORAL | 0 refills | 30 days | PRN
Start: 2021-08-02 — End: ?

## 2021-08-02 MED ORDER — LACTULOSE 10 GRAM/15 ML ORAL SOLUTION
Freq: Every day | ORAL | 0 refills | 90 days | Status: CN | PRN
Start: 2021-08-02 — End: 2021-10-31

## 2021-08-02 MED ADMIN — ibuprofen (MOTRIN) tablet 400 mg: 400 mg | ORAL | @ 12:00:00

## 2021-08-02 MED ADMIN — carboxymethylcellulose sodium (THERATEARS) 0.25 % ophthalmic solution 2 drop: 2 [drp] | OPHTHALMIC | @ 21:00:00

## 2021-08-02 MED ADMIN — rifAXIMin (XIFAXAN) tablet 550 mg: 550 mg | ORAL | @ 12:00:00 | Stop: 2021-08-18

## 2021-08-02 MED ADMIN — sertraline (ZOLOFT) tablet 50 mg: 50 mg | ORAL | @ 12:00:00

## 2021-08-02 MED ADMIN — acetaminophen (TYLENOL) tablet 650 mg: 650 mg | ORAL | @ 03:00:00

## 2021-08-02 MED ADMIN — hydrOXYzine (ATARAX) tablet 25 mg: 25 mg | ORAL | @ 01:00:00

## 2021-08-02 MED ADMIN — lactulose (CEPHULAC) packet 6 g: 5 g | ORAL | @ 17:00:00

## 2021-08-02 MED ADMIN — rifAXIMin (XIFAXAN) tablet 550 mg: 550 mg | ORAL | @ 01:00:00 | Stop: 2021-08-18

## 2021-08-02 MED ADMIN — calcium carbonate (TUMS) chewable tablet 200 mg of elem calcium: 200 mg | ORAL | @ 22:00:00

## 2021-08-02 MED ADMIN — calcium carbonate (TUMS) chewable tablet 200 mg of elem calcium: 200 mg | ORAL | @ 17:00:00

## 2021-08-02 MED ADMIN — hydrOXYzine (ATARAX) tablet 25 mg: 25 mg | ORAL | @ 17:00:00 | Stop: 2021-08-02

## 2021-08-02 MED ADMIN — acetaminophen (TYLENOL) tablet 650 mg: 650 mg | ORAL | @ 21:00:00

## 2021-08-02 MED ADMIN — calcium carbonate (TUMS) chewable tablet 200 mg of elem calcium: 200 mg | ORAL | @ 12:00:00

## 2021-08-02 NOTE — Unmapped (Signed)
Psychiatry      Daily Progress Note      Admit date/time: 07/19/2021  5:08 PM   LOS: 14 days      Assessment:  Alexis Burns is a 57 y.o., White race, Not Hispanic, Latino/a, or Spanish origin ethnicity,  ENGLISH speaking female with a history of Major depressive disorder, multiple suicide attempts, who presents for evaluation of Severe depression, plan for ECT.     Alexis Burns is an [facility] patient since 2019 with a history of depression and anxiety, with past hospitalizations in 2017 at [facility] and admission to crisis unit in 2021 for suicidal ideation. Patient presents as a direct admission from clinic after discussion about initiation of index series of ECT.  She describes lifelong mood symptoms that have acutely worsened in the last year in the setting of worsening health.  Follows with [facility] clinic.  Patient has had significant depressed mood, cognitive delays, tearfulness, decreased appetite, poor sleep, passive suicidal ideation which has been managed in the outpatient setting with extensive safety planning.  Patient has noted that these symptoms have lasted for 8 months, however, she does not recall the last time that she was happy.  From patient's husband, there have been times where patient has been happy and appears that life stressors will push patient's into a depressive episode.  Patient's symptoms consistent with major depressive disorder.       Patient has long history of mood symptoms dating back to her 33s, she has been established w/ [facility] since 2019. September 2021 she was admitted to the [facility] for suicidal ideation with a plan to shoot herself. Per outpatient documentation: She has had multiple previous med trials including: past medication trials include Prozac (stopped working), risperidone (brief trial), lithium (intolerable side effects), buspirone (unhelpful at 20mg  TID), duloxetine (90mg  well-tolerated but not helpful on its own), aripiprazole (as adjunct for duloxetine, eventually stopped due to tremors), Wellbutrin (anxiety, dry mouth), Seroquel (ineffective), Cytomel (dry mouth, joint pain).  Most recently, she is taking Pristiq 100 mg and Klonopin PRN for anxiety.  She is on the ECT schedule for MWF.      At this time, we will initiate sertraline to target her depressive symptoms in addition to ECT. Given her liver disease, will not titrate above 100mg  daily.  Patient started on Sertraline 25 mg on 5/25, increased to 50 mg on 5/28.   MMSE on 5/30 was 27/30 (3 point lost for memory), BDI on 5/25 was 30 (down from 51 on 07/20/21). Pt has had 5 ECT treatments, and scheduled for 6th treatment of index treatment on 08/02/21. Plan to discuss with ECT team about outpatient ECT treatment.       Patient started with L calf pain on morning of 5/25.  Patient describes it as a muscle cramp that occurs whenever she gets up and walks and lingers.  No redness, swelling, warmth to area is reassuring.  No shortness of breath or chest pain.  Bilateral lower extremity DVT's with no clots.    Patient endorsed some vision changes and headache associated with ECT treatments, curbsided neurology and opthalmology who stated safe to continue with ECT and to continue to monitor if symptoms do not improve or worsen.     As of 08/02/2021, continued hospitalization is warranted given severe major depressive symptom burden, suicidal ideation, continued treatment with ECT.        Diagnoses:   Active Problems:    Severe episode of recurrent major depressive disorder, without  psychotic features (CMS-HCC)    Plan:  Safety:  -- Continue admission to inpatient psychiatric unit for safety, stabilization, and treatment.   -- Admission status: Voluntary  -- On unit/off unit level of supervision: q15 min checks / 1:1 (staff:patient)  -- Medical equipment: The treatment team has determined that this patient requires the continued use of medical equipment  walker for their care and healing. Specific medical rationale for need for equipment: impaired mobility and fall risk,    Psychiatry:  # Major Depressive Disorder   -- Continue ECT MWF    -- Continue Sertraline 50 mg daily (s5/25, i5/28)  -- s/p Effexor XR (disc 5/23) 75 mg due to SE's  -- 07/20/21 MMSE score was 25, 23, BDI score was 51; 07/26/21 MMSE  was 27 and  BDI was 30, Today's  MMSE on 07/31/21 was 27. BDI on 08/02/21 is 20  -- MMSE weekly on Tues/Thurs, BDI on Thurs  -- TSH wnl     # Anxiety  -- Stop home klonopin (0.5 mg in AM, lunch and 1 mg at bedtime)  --  switching Hydroxyzine 25 mg PRN from q6h to daily for anxiety    # Insomnia  -- discontinuing Trazodone 25 mg at bedtime PRN for sleep given orthostatic hypotension and patient not requiring PRN for a couple of days     Medical:  # Vision loss/retro-orbital eye pain/headache  -Patient reports retro-orbital pain and decrease in vision with ECT treatments  curbsided ophthalmology who stated pt experiencing minor transient IOP that should resolve over time. Curbsided neurology who stated given headache/retro-orbital pain resolves  and is transient also related to transient IOP and stated safe to continue with ECT and less concerns for increases in ICP. If patient begins to have persistent symptoms or worsens, recommend further evaluation.   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  Occurs when she walks and lingers. No shortness of breath, no chest pain, no redness, warmth to leg.  -- PVLs negative for clot    #Indigestion  - starting calcium carbonate TUMS , 200 mg TID w/ meals    #Decreased hearing   - follow up outpatient with ENT referral  -- Metabolic Monitoring:   Not indicated      Social/Disposition:  -- Continue hospitalization at this time.   -- Primary team to follow up with family, outpatient resources        Maryjo Rochester, MD, PGY-1      Subjective:    Hours of sleep overnight :   8.25 hrs.     Per 08/01/21 RN Epic note:   Mitsue has been visible in  the milieu . Minimal interaction with peers observed. Her mood is sad , affect constricted with good eye contact. She received ECT treatment this morning and tolerated it well . She is medication compliant .She received Motrin 400mg  prn for a headache 3/10 with good response.  She is tolerating meals . She ambulates with r.w. She denies s/h/I , a/v/h. Plan of care reviewed . 1:1 interaction completed.     MD interview:  Reports she has a slight headache today, but does not feel like her HAs are worsening or increasing in frequency. Only notes feeling dizzy or lightheaded when getting out of bed in the morning. Notices some nausea when she hasn't eaten. Had a BM yesterday. Has noticed that her vision and hearing has not been how it used to be since starting ECT. States her vision is blurry and disorienting. Notes it gets worse with each ECT treatment. Notes pain behind her eyes when she has headaches but no direct eye pain. Thinks vision is worse in R than L. Noticed double vision only once. Denies vertigo or tinnitus. Endorses feeling anxious about being thrown back in the world especially as she feels like some of her family members do not care as they haven't noticed that she dropped off the face of the earth.      Later in the afternoon     Discussed neurology and opthomology recs and discontinuing trazodone, which patient was agreeable to . Pt stated felt still needed atarax, but was fine decreasing it to daily prn as needed.    Objective:    Vitals:  Patient Vitals for the past 12 hrs:   BP Temp Temp src Pulse Resp SpO2   08/02/21 0900 -- 36.6 ??C (97.9 ??F) -- -- -- --   08/02/21 0839 118/63 -- -- 96 -- 99 %   08/02/21 0837 116/63 -- -- 90 -- 97 %   08/02/21 0811 108/87 -- -- 91 -- 97 %   08/02/21 0808 142/85 -- -- 82 -- 98 %   08/02/21 0806 103/60 36.6 ??C (97.9 ??F) Temporal 77 18 97 %       Mental Status Exam:    Appearance:    Well nourished, Well developed and Clean/Neat   Motor:   No abnormal movements   Speech/Language:    Normal rate, volume, tone, fluency   Mood:   okay   Affect:   appears tired after ECT   Thought process:   Logical, linear, clear, coherent, goal directed   Thought content:      Denies active SI   Perceptual disturbances:     Denies auditory and visual hallucinations, behavior not concerning for response to internal stimuli     Insight:    fair   Judgment:    fair   Impulse Control:  improved   Other:         PE:   Gen: No acute distress, well developed and well nourished  Resp: Normal work of breathing      Vital Signs:   Vitals:    08/02/21 0900   BP:    Pulse:    Resp:    Temp: 36.6 ??C (97.9 ??F)   SpO2:        Test Results:  Data Review:   Lab results last 24 hours:    No results found for this or any previous visit (from the past 24 hour(s)).  Imaging: None    Psychometrics:  PHQ-9 PHQ-9 TOTAL SCORE   07/29/2021  9:20 PM 15   07/22/2021   8:00 PM 26   07/19/2021   7:23 PM 26   06/18/2021   5:00 PM 24   06/05/2021   9:00 AM 26         Social Determinants of Health with Concerns     Internet Connectivity: Not on file   Tobacco Use: Medium Risk    Smoking Tobacco Use: Former    Smokeless Tobacco Use: Never    Passive Exposure: Not on file   Substance Use: Not on file   Physical Activity: Not on file   Interpersonal Safety: Not on file   Stress: Not on file Intimate Partner Violence: Not on file   Social Connections: Not on file        ---  Time-based billing disclaimer:  I personally spent 55   minutes face-to-face and non-face-to-face in the care of this patient, which includes all pre, intra, and post visit time on the date of service.  All documented time was specific to the E/M visit and does not include any procedures that may have been performed.

## 2021-08-02 NOTE — Unmapped (Signed)
Pt was lying in bed at beginning of shift. Described her mood as pretty good. Pt was calm, cooperative and pleasant. Denied SI/HI/AVH. No unsafe behaviors noted. Complained of headache and anxiety @ 2250 and was given prn Tylenol and atarax with good effect. [place] Spaniel she will be discharging on Friday and do outpatient ECT treatment. Care plan reviewed. Will continue to support and monitor.

## 2021-08-02 NOTE — Unmapped (Signed)
I am reaching out from the [facility] STEP clinic to confirm your appointment for Tuesday, June 6th at 10:30. If you have any questions/ concerns regarding your appointment, please give [place] a call at [phone]. Thank you and have a great day.

## 2021-08-03 MED ORDER — SERTRALINE 50 MG TABLET
ORAL_TABLET | Freq: Every day | ORAL | 0 refills | 30 days | Status: CP
Start: 2021-08-03 — End: 2021-09-02

## 2021-08-03 MED ORDER — HYDROXYZINE HCL 25 MG TABLET
ORAL_TABLET | Freq: Every day | ORAL | 0 refills | 30 days | Status: CP | PRN
Start: 2021-08-03 — End: ?

## 2021-08-03 MED ORDER — CARBOXYMETHYLCELLULOSE SODIUM 0.25 % EYE DROPS
Freq: Four times a day (QID) | OPHTHALMIC | 0 refills | 38 days | Status: CP
Start: 2021-08-03 — End: ?

## 2021-08-03 MED ADMIN — hydrOXYzine (ATARAX) tablet 25 mg: 25 mg | ORAL | @ 02:00:00

## 2021-08-03 MED ADMIN — rifAXIMin (XIFAXAN) tablet 550 mg: 550 mg | ORAL | @ 02:00:00 | Stop: 2021-08-18

## 2021-08-03 MED ADMIN — rifAXIMin (XIFAXAN) tablet 550 mg: 550 mg | ORAL | @ 15:00:00 | Stop: 2021-08-03

## 2021-08-03 MED ADMIN — calcium carbonate (TUMS) chewable tablet 200 mg of elem calcium: 200 mg | ORAL | @ 15:00:00 | Stop: 2021-08-03

## 2021-08-03 MED ADMIN — caffeine citrate (CAFCIT) injection: INTRAVENOUS | @ 13:00:00 | Stop: 2021-08-03

## 2021-08-03 MED ADMIN — ondansetron (ZOFRAN) injection: INTRAVENOUS | @ 13:00:00 | Stop: 2021-08-03

## 2021-08-03 MED ADMIN — labetaloL (NORMODYNE,TRANDATE) injection: INTRAVENOUS | @ 13:00:00 | Stop: 2021-08-03

## 2021-08-03 MED ADMIN — ibuprofen (MOTRIN) tablet 400 mg: 400 mg | ORAL | @ 17:00:00 | Stop: 2021-08-03

## 2021-08-03 MED ADMIN — promethazine (PHENERGAN) injection: INTRAVENOUS | @ 13:00:00 | Stop: 2021-08-03

## 2021-08-03 MED ADMIN — sodium chloride (NS) 0.9 % infusion: INTRAVENOUS | @ 13:00:00 | Stop: 2021-08-03

## 2021-08-03 MED ADMIN — succinylcholine (ANECTINE) injection: INTRAVENOUS | @ 13:00:00 | Stop: 2021-08-03

## 2021-08-03 MED ADMIN — ibuprofen (MOTRIN) tablet 400 mg: 400 mg | ORAL | @ 02:00:00

## 2021-08-03 MED ADMIN — sertraline (ZOLOFT) tablet 50 mg: 50 mg | ORAL | @ 15:00:00 | Stop: 2021-08-03

## 2021-08-03 MED ADMIN — propofoL (DIPRIVAN) injection: INTRAVENOUS | @ 13:00:00 | Stop: 2021-08-03

## 2021-08-03 MED ADMIN — hydrOXYzine (ATARAX) tablet 25 mg: 25 mg | ORAL | @ 20:00:00 | Stop: 2021-08-03

## 2021-08-03 NOTE — Unmapped (Signed)
Care plan reviewed.  Pt calm and cooperative during evening.  Pt in dayroom with peers socializing and watching tv.  Pt also showered.  Compliant with medication.  PRN Motrin given for headache.  Denied SI/AVH/HI.  Pt ready for discharge after ECT in morning.  No unsafe behaviors noted.  Continue to monitor.

## 2021-08-03 NOTE — Unmapped (Unsigned)
[facility]  STEP Patient Psychotherapy    Name: Alexis Burns  Date: 08/03/21  MRN: [id number]  DOB: May 21, 1965  PCP: Jeannine Boga, FNP      Service: Cancel: It appears pt hasn't completed hospital discharge yet.  Left VM to call and schedule session or wait until 6/23 session.    Diagnoses:   Severe recurrent major depression 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) [F33.2]   Anxiety [F41.9]    [name], LCSW  [facility]   08/03/21      Alexis Burns  10:05 AM

## 2021-08-03 NOTE — Unmapped (Signed)
Left VM about not attending today's session noting need to call to schedule a new session or wait until next scheduled session. MRM

## 2021-08-03 NOTE — Unmapped (Signed)
[facility]  Psychiatry   Discharge Summary    Admit date and time: 07/19/2021  5:08 PM  Discharge date and time: 08/03/21  Discharge to: Home  Discharge Service: Psychiatry (PSY)  Discharge Attending Physician: Bryn Gulling, MD  Discharge Unit 24-7 Contact Number: [phone] [facility] - Roderic Ovens      Allergies: Haldol [haloperidol lactate], Cyclosporine, Tetracycline, and Tramadol    Chief Concern/Admitting Diagnosis:  Initiation of index ECT treatment for recurrent major depressive disorder    Discharge Diagnosis:   Principal Problem:    Severe episode of recurrent major depressive disorder, 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) POA: Unknown  Active Problems:    Anxiety POA: Yes    Mild cognitive impairment POA: Yes  Resolved Problems:    * No resolved hospital problems. *      Stressors: Treatment resistant depression, chronic mental illness, financial stress     Disability Assessment Scale: WHODAS 2.0 Clinically Assessed as Moderate      Discharge Day Services:    Hours of sleep overnight : 6.75 hrs.    The treatment team, including resident and attending physicians, nursing staff, and social work/case management , met to discuss the patient's progress and plan of care.    Per 08/02/21 RN Epic note:   Alexis Burns has been calm and cooperative throughout the shift. Pt said she slept well last night. She reported to have a headache throughout the shift and received prn tylenol and ibuprofen. She also requested atarax for anxiety. Pt spent most of the day in the dayroom interacting with other patients. She attended group and pleasantly interacts with others. Med compliant and out for all meals. Denies SI/HI. Will continue to monitor.           MD interview:   Pt reports she is doing fine, and ECT went okay, but does not remember much. She reports a mild headache that she states will improve throughout the day. Patient reports she is looking forward to restarting life and looking forward to seeing her two dogs. She states that she knows she is going into a crisis when she has crying spells and plans to use her husband, therapist and psychiatrist as support. 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 Denies SI.Discussed follow up with ECT outpatient and requiring supervision and transportation by family for treatments. Also discussed medication changes and getting rid of old medications including the klonopin. Patient reports they have guns at home that are unloaded and locked away with patient not having any access. No other questions or concerns.     Talked to patient 's husband Alexis Burns  Discussed updates about patient ECT treatment, including patient transitioning to ECT twice a week with potential transition to once a week. Discussed that someone would have to be with patient during her ECT treatment and drive her home. Discussed her upcoming psychiatry appointment, and rescheduling her orthopaedic appointment since they are on same day. Husband reports gun in home are unloaded and locked away with patient having no access. Discussed getting rid of patient's old medication including the pristiq, klonopin, norvasc, and trazodone and safety planning. Husband states prefers for her new medications to be sent to nearby home [facility] then inpatient. No other questions or concerns.  ROS: As above    Vitals: Patient Vitals for the past 12 hrs:   BP Temp Temp src Pulse Resp SpO2   08/03/21 1056 118/98 -- -- 70 22 98 %   08/03/21  1013 120/60 -- -- 73 22 100 %   08/03/21 1006 106/79 -- -- 66 20 100 %   08/03/21 1002 89/56 36.1 ??C (97 ??F) -- 66 18 97 %   08/03/21 0937 119/74 36.8 ??C (98.2 ??F) Temporal 70 13 95 %   08/03/21 0934 116/81 36.8 ??C (98.2 ??F) Temporal 73 13 92 %   08/03/21 0931 115/81 36.8 ??C (98.2 ??F) Temporal 79 16 90 %   08/03/21 0928 122/72 36.8 ??C (98.2 ??F) Temporal 75 15 90 %   08/03/21 0924 118/88 36.9 ??C (98.4 ??F) Temporal 77 14 91 %       Height:  167.6 cm (5' 6)  Weight: (!) 107.5 kg (237 lb)  BMI: Body mass index is 38.25 kg/m??.      Mental Status Exam:  Appearance:    Appears stated age, Well nourished, and Clean/Neat   Motor:   No abnormal movements   Speech/Language:    Normal rate, volume, tone, fluency and Language intact, well formed   Mood:    Fine   Affect:   Calm, Cooperative, and Dysthymic   Thought process:   Logical, linear, clear, coherent, goal directed   Thought content:     Denies SI, HI, self harm, delusions, obsessions, paranoid ideation, or ideas of reference   Perceptual disturbances:     Denies auditory and visual hallucinations, behavior not concerning for response to internal stimuli     Orientation:   Oriented to person, place, time, and general circumstances   Attention:   Able to fully attend without fluctuations in consciousness   Concentration:   Able to fully concentrate and attend   Memory:    Short term and immediate memory impairment  long term memory appears more grossly intact    Fund of knowledge:    Consistent with level of education and development   Insight:     Fair   Judgment:    Fair   Impulse Control:   Fair       Test Results:  Data Review:   Results for orders placed or performed during the hospital encounter of 07/19/21   COVID-19 PCR    Specimen: Nasopharyngeal Swab   Result Value Ref Range    SARS-CoV-2 PCR Negative Negative   COVID-19 PCR    Specimen: Nasopharyngeal Swab   Result Value Ref Range    SARS-CoV-2 PCR Not Detected Not Detected   Basic Metabolic Panel   Result Value Ref Range    Sodium 147 (H) 135 - 145 mmol/L    Potassium 3.9 3.4 - 4.8 mmol/L    Chloride 112 (H) 98 - 107 mmol/L    CO2 29.0 20.0 - 31.0 mmol/L    Anion Gap 6 5 - 14 mmol/L    BUN 13 9 - 23 mg/dL    Creatinine 1.61 0.96 - 0.80 mg/dL    BUN/Creatinine Ratio 19     eGFR CKD-EPI (2021) Female >90 >=60 mL/min/1.41m2    Glucose 97 70 - 179 mg/dL    Calcium 9.2 8.7 - 04.5 mg/dL   TSH   Result Value Ref Range    TSH 1.136 0.550 - 4.780 uIU/mL   Vitamin B12 Level Result Value Ref Range    Vitamin B-12 474 211 - 911 pg/ml   Basic Metabolic Panel   Result Value Ref Range    Sodium 144 135 - 145 mmol/L    Potassium 4.6 3.4 - 4.8 mmol/L    Chloride 114 (H) 98 -  107 mmol/L    CO2 31.0 20.0 - 31.0 mmol/L    Anion Gap <1 (L) 5 - 14 mmol/L    BUN 10 9 - 23 mg/dL    Creatinine 1.61 (H) 0.60 - 0.80 mg/dL    BUN/Creatinine Ratio 12     eGFR CKD-EPI (2021) Female 81 >=60 mL/min/1.25m2    Glucose 83 70 - 179 mg/dL    Calcium 9.0 8.7 - 09.6 mg/dL   Basic Metabolic Panel   Result Value Ref Range    Sodium 146 (H) 135 - 145 mmol/L    Potassium 4.2 3.4 - 4.8 mmol/L    Chloride 113 (H) 98 - 107 mmol/L    CO2 28.0 20.0 - 31.0 mmol/L    Anion Gap 5 5 - 14 mmol/L    BUN 10 9 - 23 mg/dL    Creatinine 0.45 4.09 - 0.80 mg/dL    BUN/Creatinine Ratio 14     eGFR CKD-EPI (2021) Female >90 >=60 mL/min/1.55m2    Glucose 82 70 - 179 mg/dL    Calcium 9.0 8.7 - 81.1 mg/dL   CBC w/ Differential   Result Value Ref Range    WBC 3.9 3.6 - 11.2 10*9/L    RBC 4.53 3.95 - 5.13 10*12/L    HGB 13.4 11.3 - 14.9 g/dL    HCT 91.4 78.2 - 95.6 %    MCV 87.7 77.6 - 95.7 fL    MCH 29.6 25.9 - 32.4 pg    MCHC 33.7 32.0 - 36.0 g/dL    RDW 21.3 08.6 - 57.8 %    MPV 8.4 6.8 - 10.7 fL    Platelet 74 (L) 150 - 450 10*9/L    Neutrophils % 50.2 %    Lymphocytes % 38.8 %    Monocytes % 6.6 %    Eosinophils % 3.6 %    Basophils % 0.8 %    Absolute Neutrophils 2.0 1.8 - 7.8 10*9/L    Absolute Lymphocytes 1.5 1.1 - 3.6 10*9/L    Absolute Monocytes 0.3 0.3 - 0.8 10*9/L    Absolute Eosinophils 0.1 0.0 - 0.5 10*9/L    Absolute Basophils 0.0 0.0 - 0.1 10*9/L   CBC w/ Differential   Result Value Ref Range    WBC 3.8 3.6 - 11.2 10*9/L    RBC 4.38 3.95 - 5.13 10*12/L    HGB 13.1 11.3 - 14.9 g/dL    HCT 46.9 62.9 - 52.8 %    MCV 88.5 77.6 - 95.7 fL    MCH 29.9 25.9 - 32.4 pg    MCHC 33.8 32.0 - 36.0 g/dL    RDW 41.3 24.4 - 01.0 %    MPV 8.0 6.8 - 10.7 fL    Platelet 69 (L) 150 - 450 10*9/L    Neutrophils % 55.6 %    Lymphocytes % 33.0 % Monocytes % 7.5 %    Eosinophils % 3.0 %    Basophils % 0.9 %    Absolute Neutrophils 2.1 1.8 - 7.8 10*9/L    Absolute Lymphocytes 1.3 1.1 - 3.6 10*9/L    Absolute Monocytes 0.3 0.3 - 0.8 10*9/L    Absolute Eosinophils 0.1 0.0 - 0.5 10*9/L    Absolute Basophils 0.0 0.0 - 0.1 10*9/L     Imaging: Radiology report(s) reviewed.  Pending Test Results: No pending test or studies    Psychometrics:   PHQ-9 PHQ-9 TOTAL SCORE   07/29/2021   9:20 PM 15   07/22/2021   8:00  PM 26   07/19/2021   7:23 PM 26   06/18/2021   5:00 PM 24   06/05/2021   9:00 AM 26         Hospital Course:           Reason for Admission: Alexis Burns is a 56 y.o. female with a history of MDD, anxiety, NASH cirrhosis, hypertension who was admitted voluntarily to the [facility] directly from clinic for initiation of index series of ECT. Patient received 6 ECT treatments during admission, and will follow up with outpatient ECT at [facility] on discharge.  Please refer to the full History and Physical note for details.    Monitoring: The level of observation on unit was initially q15 min checks and off unit, Restrict to unit. The patient did not have any episodes of  aggression, agitation, self-injurious behavior, inappropriate behavior, and attempted elopement while admitted. The patient was able to maintain safe behavior on the unit before discharge, but in the setting of COVID19 pandemic and mask requirements for patients, safety checks are maintained at every 15 minutes.     Psychiatry: The patient was offered the following resources during their hospitalization: psychiatric physician and nursing services, clinical case management, occupational and recreational therapies,physical therapy, neurology consultation and  medical consultation . The aforementioned disciplines established multidisciplinary treatment plan(s) within 24 hours of admission. The treatment plans were discussed on a daily basis and formally updated weekly. The patient had access to individual, group, and milieu therapeutic modalities throughout admission.    Diagnostic clarification was achieved through serial mental status examinations, serial rating scales (including PHQ-9, MMSE, and BDI ), behavioral observations, collateral obtained from family, collateral obtained from outpatient providers, previous medical records. Rating scale results were reviewed, compared to clinical observations, and were used to inform treatment decisions.     The patient's presentation, including symptoms of significant depressed mood, cognitive delays, decreased appetite, poor sleep, passive SI appears to be most consistent with a diagnosis of  recurrent major depressive disorder. There was a discussion of side effects, risks, benefits, alternatives, and indications for treatment with sertraline, including but not limited to headache, nausea, vomiting, GI upset, sexual dysfunction. The patient asked appropriate questions, acknowledged understanding of answers, and provided informed consent to  discontinuing Pristiq and starting Sertraline  to target depressive symptoms. This medication was chosen because of prior medication trials with alternate medications, including Prozac, risperidone, lithium, Wellbutrin, Seroquel, Cytomel, duloxetine, apripirazole . Additional psychiatric medication changes included discontinuing clonazepam due to gait instability, concerns with short term memory and discontinued trazodone due to orthostatic hypotension.  Patient was given atarax 25 mg as needed for anxiety which worked well for her but led to some nightmares . With regard to medication tolerability, patient tolerated medications well.      During the patient's hospital course, there was improvement in depression.  The patient's participation in therapeutic groups, 1:1 interactions with staff, and family interactions were appropriate.    With the patient's consent, the team maintained contact with family members/social supports throughout the admission for ongoing inpatient management and disposition planning. The patient's husband Alexis Burns was last contacted on 08/03/2021.        The team maintained contact with outpatient mental health clinicians regarding the plan of care and disposition.      The patient discharged to home.      The patient will follow up with outpatient services, including outpatient ECT treatment and continued outpatient treatment at [facility]  psychiatry.     The treatment team provided psycho-education and made recommendations regarding medication adherence, adaptive coping strategies, healthy lifestyle modifications , and educational interventions.     Recommendation for discharge safety planning included: removal of all firearms from the home, locking all sharps in a secure container, locking all weapons and dangerous objects in a secure container, locking all medications in a secure container, and increasing level of supervision.    Psychiatric Medications on Discharge  Sertraline 50 mg for MDD  Hydroxyzine 25 mg daily PRN for anxiety        Other Medical Issues: Labs collected on admission were reviewed and found to be unremarkable. An EKG was performed and was unremarkable. Additional laboratory monitoring included: Vitamin B12 and TSH levels and  PT/INR.         The patient was monitored for physical complaints, including potential medication side effects. Transient physical complaints included headaches and migraines, which were relieved by sumatriptan and ibuprofen .       Medical:  # Vision loss/retro-orbital eye pain/headache  -Patient reports retro-orbital pain and decrease in vision with ECT treatments. Curbsided ophthalmology who stated pt experiencing minor transient IOP that should resolve over time. Curbsided neurology who stated given headache/retro-orbital pain resolves  and is transient also related to transient IOP and stated safe to continue with ECT and less concerns for increases in ICP. If patient begins to have persistent symptoms or worsens, recommend further evaluation.   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  Occurs when she walks and lingers. No shortness of breath, no chest pain, no redness, warmth to leg.  -- PVLs negative for clot     #Indigestion  - calcium carbonate TUMS , 200 mg TID w/ meals     #Decreased hearing   - follow up outpatient with ENT referral, ambulatory referral placed      Metabolic monitoring was not indicated on patient's current medication regimen.     Risk Assessment: On the day of discharge, the patient was evaluated by the attending MD and was discussed with the multidisciplinary treatment team. The treatment team has determined the patient to be stable and appropriate for discharge with medical approval.     Day of discharge assessment included a suicide and violence risk assessment. Risk factors for self-harm/suicide that are present at time of discharge include: current diagnosis of depression and chronic mental illness > 5 years.    A violence risk assessment was performed on the day of discharge. Risk factors for aggression/violence that are present at time of discharge include: N/A.     These risk factors are mitigated by the following protective factors:lack of  active SI/HI, no history of violence, motivation for treatment, presence of a significant relationship, presence of an available support system, current treatment compliance, safe housing, support system in agreement with treatment recommendations, presence of a safety plan with follow-up care, and patient reports guns in home that are unloaded and locked up with no access . Furthermore, the treatment team has attempted to mitigate risk through supportive psychotherapy, providing psycho-education, thoughtful medication management, and communication with outpatient providers for continuity of care. The team educated the patient about relevant modifiable risk factors, including following recommendations for treatment of psychiatric illness(es) and abstaining from substance use.     While future psychiatric events cannot be accurately predicted, the patient does not currently require further acute inpatient psychiatric care and does not currently meet [facility] involuntary commitment criteria. It is recommended that the patient continue treatment in outpatient care. A follow up plan and crisis plan are in place, have been discussed with the patient, and they agree to the plan at time of discharge.     Condition at Discharge: fair  Discharge Medications:      Your Medication List        STOP taking these medications      amLODIPine 10 MG tablet  Commonly known as: NORVASC     clonazePAM 0.5 MG tablet  Commonly known as: KlonoPIN     desvenlafaxine succinate 100 MG 24 hr tablet  Commonly known as: PRISTIQ     lactulose 10 gram/15 mL solution     MAGNESIUM ORAL     nystatin 100,000 unit/gram powder  Commonly known as: MYCOSTATIN     potassium chloride 20 MEQ ER tablet     traZODone 50 MG tablet  Commonly known as: DESYREL            START taking these medications      carboxymethylcellulose sodium 0.25 % Drop  Commonly known as: THERATEARS  Administer 2 drops to both eyes four (4) times a day.     hydrOXYzine 25 MG tablet  Commonly known as: ATARAX  Take 1 tablet (25 mg total) by mouth daily as needed for anxiety.     sertraline 50 MG tablet  Commonly known as: ZOLOFT  Take 1 tablet (50 mg total) by mouth daily.            CONTINUE taking these medications      albuterol 90 mcg/actuation inhaler  Commonly known as: PROVENTIL HFA;VENTOLIN HFA  Inhale 2 puffs every six (6) hours as needed for wheezing.     XIFAXAN 550 mg Tab  Generic drug: rifAXIMin  Take 1 tablet (550 mg total) by mouth Two (2) times a day.            ASK your doctor about these medications      semaglutide 1 mg/dose (4 mg/3 mL) Pnij injection  Commonly known as: OZEMPIC  Inject 1 mg under the skin every seven (7) days.  Ask about: Should I take this medication?              Advanced Directives:  Does patient have an advance directive covering medical treatment?: Patient has advance directive covering medical treatment, copy not in chart.       HCDM (patient stated preference): Alexis Burns,Alexis Burns - Spouse - [phone]  Healthcare Decision Maker: Patient does not wish to appoint a Health Care Decision Maker at this time  Extended Emergency Contact Information  Primary Emergency Contact: Alexis Burns,Alexis Burns   [place]  Home Phone: [phone]  Mobile Phone: [phone]  Relation: Spouse  Interpreter needed? No  Information offered on HCDM, Medical & Mental Health advance directives:: Patient declined information.   Does patient have an advance directive covering mental health treatment?: Patient does not have advance directive covering mental health treatment.  Reason patient does not have an advance directive covering mental health treatment:: Patient does not wish to complete one at this time.    Discharge Instructions:     Other Instructions       Discharge instructions      HOSPITAL TRANSITION INFORMATION*:  Reason for admission: initiation of index ECT for refractory depression     Discharge Diagnosis:   Active Problems:    Severe episode of recurrent major depressive disorder, 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)      Test Results:  Data Review: First and last BMP/CMP-    Lab Results - Current Encounter  Component Value Date/Time   NA 146 (H) 07/23/2021 1144   NA 147 (H) 07/20/2021 1339   K 4.2 07/23/2021 1144   K 3.9 07/20/2021 1339   CL 113 (H) 07/23/2021 1144   CL 112 (H) 07/20/2021 1339   CO2 28.0 07/23/2021 1144   CO2 29.0 07/20/2021 1339   BUN 10 07/23/2021 1144   BUN 13 07/20/2021 1339   CREATININE 0.70 07/23/2021 1144   CREATININE 0.70 07/20/2021 1339   GLU 82 07/23/2021 1144   GLU 97 07/20/2021 1339   CALCIUM 9.0 07/23/2021 1144   CALCIUM 9.2 07/20/2021 1339    Coagulation - Results in Past 30 Days  Result Component Current Result Ref Range Previous Result Ref Range  INR 1.03 (07/05/2021)  Not in Time Range   PT 11.6 (07/05/2021) 9.8 - 12.8 sec Not in Time Range     HgbA1C- No results found for: A1C  Lipid Panel - No results found for: CHOL, TRIG, HDL, LDL  Vitamin levels- No results found for: VITDTOTAL, VITB12  TSH-   Lab Results - Current Encounter  Component Value Date/Time   TSH 1.136 07/20/2021 1339    Toxicology screen- No results found for: AMPHU, BARBU, BENZU, CANNAU, COCAU, METHU, OPIAU  Urinalysis-  No results found for: PHUR, SPECGRAV, GLUCOSEU, BLOODU, KETONESU, PROTEINUR, UROBILINOGEN, BILIRUBINUR, LEUKOCYTESUR, NITRITE  Imaging: Radiology report(s) reviewed.   Encounter Date: 07/18/21  ECG 12 lead  Result Value   EKG Systolic BP    EKG Diastolic BP    EKG Ventricular Rate 82   EKG Atrial Rate 82   EKG P-R Interval 182   EKG QRS Duration 72   EKG Q-T Interval 380   EKG QTC Calculation 443   EKG Calculated P Axis 49   EKG Calculated R Axis -4   EKG Calculated T Axis 44   QTC Fredericia 421   Narrative   NORMAL SINUS RHYTHM  LOW VOLTAGE QRS  BORDERLINE ECG  WHEN COMPARED WITH ECG OF 04-Nov-2019 18:23,  NO SIGNIFICANT CHANGE WAS FOUND  Confirmed by Mariane Baumgarten ([id number]) on 07/19/2021 8:53:49 AM       Pending Test Results: No pending test or studies    Advanced Directives:  Does patient have an advance directive covering medical treatment?: Patient has advance directive covering medical treatment, copy not in chart.       HCDM (patient stated preference): Rylee,Alexis Burns - Spouse - [phone]  Healthcare Decision Maker: Patient does not wish to appoint a Health Care Decision Maker at this time  Extended Emergency Contact Information  Primary Emergency Contact: Alexis Burns,Alexis Burns  [place]  Home Phone: [phone]  Mobile Phone: [phone]  Relation: Spouse  Interpreter needed? No  Information offered on HCDM, Medical & Mental Health advance directives:: Patient declined information.   Does patient have an advance directive covering mental health treatment?: Patient does not have advance directive covering mental health treatment.  Reason patient does not have an advance directive covering mental health treatment:: Patient does not wish to complete one at this time.      Discharge Unit 24-7 Contact Number: [phone] [facility]    DISCHARGE INSTRUCTIONS:  -Please continue taking medications as prescribed.   -- Please follow up with ENT for hearing  changes, and if vision does not improve please follow up with ophthalmology  -- Please get rid of old medications,listed in AVS and discontinued medication including klonopin , norvasc, trazodone, and pristiq  -- Please follow up with PCP about restarting ozempic  -- Please follow up with physical therapy for gait instability  -- Please follow up with  your outpatient neurologist about headaches    -Please refrain from using illicit substances, as these can affect your mood and could cause anxiety or other concerning symptoms.    -- Your outpatient provider will monitor your labs as indicated    -As a condition of discharge, you agree to follow-up with appropriate outpatient psychiatric providers. You have been provided prescriptions for a limited supply of your medications. All refills will need to be handled by your outpatient provider.    -Do not make changes to your medications, including taking more or less than prescribed, unless under the supervision of your physician. Be aware that some medications may make you feel worse if abruptly stopped.    -Seek further medical care for any increase in symptoms or new symptoms, such as thoughts of wanting to hurt yourself, hurt others, or if you have increased agitation.    Suicide Prevention Resources:  National Suicide Prevention Lifeline - after September 16, 2020, dial the 3 digit code 36. Prior to September 16, 2020, call 1-800-273-TALK (8255) Spanish/Espa??ol: (701)566-1512   Crisis Text Line Text HOME to (641)208-3915   Suicide Prevention Resource Center ArizonaFirm.com.ee   National Institutes of Health http://www.maynard.net/   Substance Abuse and Mental Health Services Administration SkateOasis.com.pt     -For immediate concerns, call 911 for emergency medical attention.    -Emergency services are available 24 hours a day at [facility] to get assistance in deciding appropriate care during periods of psychiatric concern.    -Consider a psychiatric advanced directive at http://nrc-pad.org/          Appointments which have been scheduled for you      Aug 06, 2021 10:00 AM  (Arrive by 9:30 AM)  ECT WITH SEDATION with Doylene Canard, DO, Lorelle Gibbs, MD, Arbutus Leas, CRNA, MINOR PROCEDURE ROOM 2 ECT [facility]  [facility] ECT 2ND FLR [facility] REGION) [address]  [place] [address]  [phone]        Aug 07, 2021 10:30 AM  (Arrive by 10:15 AM)  RETURN VIDEO MYCHART with Lovett Calender, MD  [facility] ADTC [place] [facility]) [address]  [place] [address]  [phone]   Please sign into My [facility] Chart at least 15 minutes before your appointment to complete the eCheck-In process. You must complete eCheck-In before you can start your video visit. We also recommend testing your audio and video connection to troubleshoot  any issues before your visit begins. Click ???Join Video Visit??? to complete these checks. Once you have completed eCheck-In and tested your audio and video, click ???Join Call??? to connect to your visit.     For your video visit, you will need a computer with a working camera, speaker and microphone, a smartphone, or a tablet with internet access.    My [place] Chart enables you to manage your health, send non-urgent messages to your provider, view your test results, schedule and manage appointments, and request prescription refills securely and conveniently from your computer or mobile device.    You can go to [email or web address] to sign in to your My [place] Chart account with your username and password. If you have forgotten your username or password, please choose the ???Forgot Username???? and/or ???Forgot Password???? links to gain access. You also can access your My [place] Chart account with the free MyChart mobile app for Android or iPhone.    If you need assistance accessing your My [place] Chart account or for assistance in reaching your provider's office to reschedule or cancel your appointment, please call [facility] [phone].           Aug 07, 2021 10:45 AM  (Arrive by 10:30 AM)  RETURN  SPORTS with Tomasa Rand, MD  [facility] [address] [facility] REGION) [phone] [address]  [place] [address]  [phone]        Aug 08, 2021 11:30 AM  (Arrive by 11:00 AM)  ECT WITH SEDATION with Lorelle Gibbs, MD, MINOR PROCEDURE ROOM 2 ECT [facility]  [facility] ECT 2ND FLR [facility] REGION) [address]  [place] [address]  [phone]        Aug 08, 2021  3:20 PM  (Arrive by 3:05 PM)  RETURN WEIGHT MANAGEMENT with Riley Kill, MD  [facility]ILY MEDICINE [address] [address] [facility] REGION) [address] [place] [address]  [address]  [place] [address]  [phone] Aug 10, 2021 10:00 AM  (Arrive by 9:30 AM)  ECT WITH SEDATION with Lorelle Gibbs, MD, MINOR PROCEDURE ROOM 2 ECT [facility]  [facility] ECT 2ND FLR [facility] REGION) [address]  [place] [address]  [phone]        Aug 14, 2021 10:00 AM  (Arrive by 9:45 AM)  NEW  SLEEP with Patrcia Dolly, PA  [facility] [address] [facility] REGION) [address]  [place] [address]  [phone]        Aug 15, 2021 10:00 AM  (Arrive by 9:30 AM)  ECT WITH SEDATION with Burney Gauze, MD, MINOR PROCEDURE ROOM 2 ECT [facility]  [facility] ECT 2ND FLR [facility] H[facility] REGION) [address]  [place] [address]  [phone]        Aug 24, 2021 10:00 AM  (Arrive by 9:45 AM)  RETURN VIDEO MYCHART with Debroah Loop, LCSW  [place] [facility] STEP [place] [facility]) [address] [place] [address]  [phone]   Please sign into My [place] Chart at least 15 minutes before your appointment to complete the eCheck-In process. You must complete eCheck-In before you can start your video visit. We also recommend testing your audio and video connection to troubleshoot any issues before your visit begins. Click ???Join Video  Visit??? to complete these checks. Once you have completed eCheck-In and tested your audio and video, click ???Join Call??? to connect to your visit.     For your video visit, you will need a computer with a working camera, speaker and microphone, a smartphone, or a tablet with internet access.    My [place] Chart enables you to manage your health, send non-urgent messages to your provider, view your test results, schedule and manage appointments, and request prescription refills securely and conveniently from your computer or mobile device.    You can go to [email or web address] to sign in to your My [place] Chart account with your username and password. If you have forgotten your username or password, please choose the ???Forgot Username???? and/or ???Forgot Password???? links to gain access. You also can access your My [place] Chart account with the free MyChart mobile app for Android or iPhone.    If you need assistance accessing your My [place] Chart account or for assistance in reaching your provider's office to reschedule or cancel your appointment, please call [facility] [phone].           Oct 11, 2021  3:00 PM  (Arrive by 2:45 PM)  RETURN WEIGHT MANAGEMENT with Riley Kill, MD  [facility] FAMILY MEDICINE [address] [facility] REGION) [address] [place] [address]  [address]  [place] [address]  [phone]                Patient was seen and plan of care was discussed with the Attending MD, Perrin Maltese , who agrees with the above statement and plan.    I spent greater than 30 minutes in the discharge of this patient.    Maryjo Rochester, MD, PGY-1

## 2021-08-03 NOTE — Unmapped (Signed)
Alexis Burns has been calm and cooperative throughout the shift. Pt said she slept well last night. She reported to have a headache throughout the shift and received prn tylenol and ibuprofen. She also requested atarax for anxiety. Pt spent most of the day in the dayroom interacting with other patients. She attended group and pleasantly interacts with others. Med compliant and out for all meals. Denies SI/HI. Will continue to monitor.   Problem: Adult Behavioral Health Plan of Care  Goal: Plan of Care Review  Outcome: Not Progressing  Goal: Patient-Specific Goal (Individualization)  Outcome: Not Progressing  Goal: Adheres to Safety Considerations for Self and Others  Outcome: Not Progressing  Goal: Absence of New-Onset Illness or Injury  Outcome: Not Progressing  Intervention: Prevent Skin Injury  Recent Flowsheet Documentation  Taken 08/02/2021 0900 by Gari Crown, RN  Skin Protection: adhesive use limited  Intervention: Prevent Infection  Recent Flowsheet Documentation  Taken 08/02/2021 0900 by Gari Crown, RN  Infection Prevention:   environmental surveillance performed   equipment surfaces disinfected   hand hygiene promoted   personal protective equipment utilized   cohorting utilized  Goal: Optimized Coping Skills in Response to Life Stressors  Outcome: Not Progressing  Goal: Develops/Participates in Biomedical scientist to Support Successful Transition  Outcome: Not Progressing  Goal: Rounds/Family Conference  Outcome: Not Progressing     Problem: Fall Injury Risk  Goal: Absence of Fall and Fall-Related Injury  Outcome: Not Progressing  Intervention: Promote Scientist, clinical (histocompatibility and immunogenetics) Documentation  Taken 08/02/2021 0900 by Gari Crown, RN  Safety Interventions:   bed alarm   elopement precautions   environmental modification   lighting adjusted for tasks/safety   low bed   nonskid shoes/slippers when out of bed   suicide precautions     Problem: Suicide Risk  Goal: Absence of Self-Harm  Outcome: Not Progressing  Intervention: Assess Risk to Self and Maintain Safety  Recent Flowsheet Documentation  Taken 08/02/2021 0900 by Gari Crown, RN  Behavior Management:   behavioral plan reviewed   behavioral plan developed   boundaries reinforced   impulse control promoted  Intervention: Promote Psychosocial Wellbeing  Recent Flowsheet Documentation  Taken 08/02/2021 0900 by Gari Crown, RN  Sleep/Rest Enhancement:   awakenings minimized   consistent schedule promoted   regular sleep/rest pattern promoted   relaxation techniques promoted   noise level reduced   natural light exposure provided     Problem: Asthma Comorbidity  Goal: Maintenance of Asthma Control  Outcome: Not Progressing     Problem: Behavioral Health Comorbidity  Goal: Maintenance of Behavioral Health Symptom Control  Outcome: Not Progressing     Problem: Hypertension Comorbidity  Goal: Blood Pressure in Desired Range  Outcome: Not Progressing

## 2021-08-03 NOTE — Unmapped (Signed)
[facility] ECT               Procedure Note                                             Requesting Attending Physician: Bryn Gulling, MD  Admit Date: 07/19/2021     Service Type: Inpatient  Requesting Attending Physician: Bryn Gulling, MD  Service requesting consult: Psychiatry  Consulting service: Psychiatry  Admit Date: 07/19/2021  Service Date: August 03, 2021      Time out was taken with staff to confirm correct patient and correct procedure to be performed.    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 No change    Beck Depression Inventory Total Score: 30 51    Mini-Mental Status ExamTotal Score: 27 25     Bush-Francis CatatoniaTotal Score:       MEDICAL INFORMATION:    Allergies:  Haldol [haloperidol lactate], Cyclosporine, Tetracycline, and Tramadol    Medical History:  See ECT Consult    Surgical History:  See ECT Consult    VITAL SIGNS:      Pre-procedure:    Vitals:    08/02/21 0837 08/02/21 0839 08/02/21 0900 08/02/21 1949   BP: 116/63 118/63  125/79   Pulse: 90 96  78   Resp:    16   Temp:   36.6 ??C (97.9 ??F) 37 ??C (98.6 ??F)   TempSrc:    Temporal   SpO2: 97% 99%  99%   Weight:       Height:         Post-stimulus:     No data found.  187/137  98    THYMATRON:  ECT # Thymatron Settings (%) Modification Seizure   Length Cuff Duration  (sec) EEG Duration  (sec) Complications Interventions   1 85 Good Adequate 19   21   HTN Labetalol 5mg                                                Medications given during procedure:   Current Facility-Administered Medications   Medication Dose Route Frequency Provider Last Rate Last Admin    acetaminophen (TYLENOL) tablet 1,000 mg  1,000 mg Oral Once PRN Mardene Celeste, MD        acetaminophen (TYLENOL) tablet 650 mg  650 mg Oral Q6H PRN Estelle Grumbles, MD   650 mg at 08/02/21 1633    albuterol (PROVENTIL HFA;VENTOLIN HFA) 90 mcg/actuation inhaler 2 puff  2 puff Inhalation Once PRN Mardene Celeste, MD        albuterol 2.5 mg /3 mL (0.083 %) nebulizer solution 2.5 mg  2.5 mg Nebulization Once PRN Mardene Celeste, MD        aluminum-magnesium hydroxide-simethicone (MAALOX MAX) 80-80-8 mg/mL oral suspension  30 mL Oral Q6H PRN Estelle Grumbles, MD   30 mL at  07/30/21 1420    calcium carbonate (TUMS) chewable tablet 200 mg of elem calcium  200 mg of elem calcium Oral 3xd Meals Maryjo Rochester, MD   200 mg of elem calcium at 08/02/21 1740    carboxymethylcellulose sodium (THERATEARS) 0.25 % ophthalmic solution 2 drop  2 drop Both Eyes QID Maryjo Rochester, MD   2 drop at 08/02/21 1634    fentaNYL (PF) (SUBLIMAZE) injection 25 mcg  25 mcg Intravenous Q5 Min PRN Mardene Celeste, MD        HYDROmorphone (PF) injection Syrg 0.5 mg  0.5 mg Intravenous Q5 Min PRN Mardene Celeste, MD        hydrOXYzine (ATARAX) tablet 25 mg  25 mg Oral Daily PRN Maryjo Rochester, MD   25 mg at 08/02/21 2146    ibuprofen (MOTRIN) tablet 400 mg  400 mg Oral Daily PRN Maryjo Rochester, MD   400 mg at 08/02/21 2146    labetalol (NORMODYNE) injection  2.5 mg Intravenous Q5 Min PRN Mardene Celeste, MD        lactulose (CEPHULAC) packet 6 g  6 g Oral Daily PRN Latrelle Dodrill, MD   6 g at 08/02/21 1241    lidocaine (LIDODERM) 5 % patch 2 patch  2 patch Transdermal Daily Azzie Roup, MD   2 patch at 07/30/21 2102    meperidine (DEMEROL) injection 12.5 mg  12.5 mg Intravenous Q5 Min PRN Mardene Celeste, MD        metoprolol (LOPRESSOR) injection 1 mg  1 mg Intravenous Q5 Min PRN Mardene Celeste, MD        ondansetron [facility]) injection 4 mg  4 mg Intravenous Once PRN Mardene Celeste, MD        oxyCODONE (ROXICODONE) immediate release tablet 5 mg  5 mg Oral Once PRN Mardene Celeste, MD        rifAXIMin [facility]smith) tablet 550 mg  550 mg Oral BID Estelle Grumbles, MD   550 mg at 08/02/21 2146    sertraline (ZOLOFT) tablet 50 mg  50 mg Oral Daily Ellamae Sia, MD   50 mg at 08/02/21 0825     Facility-Administered Medications Ordered in Other Encounters   Medication Dose Route Frequency Provider Last Rate Last Admin    caffeine citrate (CAFCIT) injection   Intravenous PRN (once a day) Theone Murdoch, CRNA   30 mg at 08/03/21 0908    labetaloL (NORMODYNE,TRANDATE) injection   Intravenous PRN (once a day) Theone Murdoch, CRNA   5 mg at 08/03/21 0921    ondansetron (ZOFRAN) injection   Intravenous PRN (once a day) Theone Murdoch, CRNA   4 mg at 08/03/21 0908    promethazine (PHENERGAN) injection   Intravenous PRN (once a day) Theone Murdoch, CRNA   6.25 mg at 08/03/21 0910          Neck Collar Used:  Yes     PROGRESS NOTE:     Alexis Burns is a 56 y.o. who presents for initiation of index series of ECT as an inpatient. Pt states she is doing well. She has been having persistent headache since the last treatment that is improving each day. Memory issue is a little worse but word-finding difficulty is improving. Mood today is good. Endorses increased energy level. Denies changes in sleep or appetite. Denies SI. States she has been hearing her peers and nurses call her name when they did not, which started  3 days ago. She has not noticed it today. Her hearing is about the same.    Mental Status Exam:  Appearance:    Well nourished and Well developed   Behavior:  Calm and Cooperative   Motor:   No abnormal movements   Speech/Language:    Normal rate, volume, tone, fluency and Language intact, well formed   Mood:   Good   Affect:   Calm, Constricted, Euthymic, Mood congruent, and jovial   Thought process:   Logical, linear, clear, coherent, goal directed   Thought content:     No SI   Perceptual disturbances:     Denies auditory and visual hallucinations, behavior not concerning for response to internal stimuli     Orientation:   Oriented to person, place, time, and general circumstances   Attention:   Able to fully attend without fluctuations in consciousness   Concentration:   Able to fully concentrate and attend   Memory:    MMSE 25/30    Fund of knowledge:    Consistent with level of education and development   Insight:     Fair   Judgment:    Fair   Impulse Control:   Fair     Physical Exam:   Gen: NAD  Pulm: No increased work of breathing  Neuro: No tremors, tics noted, no abnormal movements    A/P: Alexis Burns is a 56 y.o. who presents for initiation of index series of ECT as an inpatient for treatment of major depressive disorder, recurrent, severe, without psychotic features.     Currently with LFRT placement, good response. She is discharging from the hospital today. Today adequate seizure, good morphology, poor post-ictal suppression.    Post-Treatment Complications: None    Recommendations for Future Treatments: Consider adding caffeine or flumazenil    Next Treatment Date: 08/06/21    Meds to take prior to ECT: None    Special Instructions: Do not take these medications after 6pm on the night before ECT: Klonopin/clonazepam (prescribed as an outpatient but not currently prescribed inpatient)    Assisted By: Thornell Sartorius, MD    Iran Planas, MD

## 2021-08-03 NOTE — Unmapped (Signed)
08/02/21 1305   Activity/Group Checklist   Tx Duration 60 Min.   Group Psychology   Note Type Progress Note   Interventions Evidence-Based Treatment informed by Crisis Management & Seeking Safety literature    Attendance Attended   Follows Direction Followed directions   Interactions Interacted appropriately   Mood Generally Euthymic   Affect Congruent with Mood; Broad   Equipment/Environment   Equipment / Environment Patient wearing mask for full session   Group Leader Observations   Group Leader Observations [name] (she/her) attended Psychology Group.  Today's group focused on importance of developing a Crisis/Safety Plan in preparing for a successful discharge.       Group members worked together to discuss various components of a Physicist, medical, Pharmacologist, Plans for Behavior Activation, Mental Health Contacts, Medication Lists, & Mental health contacts (e.g., psychiatry office, counselor office, Suicide Hotlines, 9-8-8, etc.).     [name] was active & appropriate throughout today's group.  She stated she has just started thinking about her own Crisis Plan in preparing for discharge this week.  [name] spoke about her perceived lack of social support within her community.  Group members assisted in generating a list of support systems in the community (e.g., psychotherapist, religious communities, etc.).   [name] stated she would like to have a sponsor for mental health needs.  She described being happy to discharge, but endorsed some stress associated with returning home.

## 2021-08-05 NOTE — Unmapped (Addendum)
[facility] ECT               Procedure Note                                             Requesting Attending Physician: Bubba Camp*  Admit Date: 08/06/2021     Service Type: Inpatient  Requesting Attending Physician: Bubba Camp*  Service requesting consult: Psychiatry  Consulting service: Psychiatry  Admit Date: 08/06/2021  Service Date: August 06, 2021      Time out was taken with staff to confirm correct patient and correct procedure to be performed.    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 No change    Beck Depression Inventory Total Score:     Baseline: 51  Most recent: 22    Mini-Mental Status ExamTotal Score:     Baseline: 25  Most recent: 30     Bush-Francis CatatoniaTotal Score:       MEDICAL INFORMATION:    Allergies:  Haldol [haloperidol lactate], Cyclosporine, Tetracycline, and Tramadol    Medical History:  See ECT Consult    Surgical History:  See ECT Consult    VITAL SIGNS:      Pre-procedure:    BP: 160/88    HR: 71    Post-stimulus:     BP: 188/97    HR: 107    Intra-ECT VS Per Anesthesia for the past 24 hrs:   Pulse BP   08/06/21 1057 87 (!) 188/97       THYMATRON:  ECT # Thymatron Settings (%) Modification Seizure   Length Cuff Duration  (sec) EEG Duration  (sec) Complications Interventions   1 85 Good Adequate 26 41                                                   Medications given during procedure:   Current Outpatient Medications   Medication Sig Dispense Refill   ??? albuterol HFA 90 mcg/actuation inhaler Inhale 2 puffs every six (6) hours as needed for wheezing. 8 g 0   ??? carboxymethylcellulose sodium (THERATEARS) 0.25 % Drop Administer 2 drops to both eyes four (4) times a day. 15 mL 0   ??? hydrOXYzine (ATARAX) 25 MG tablet Take 1 tablet (25 mg total) by mouth daily as needed for anxiety. 30 tablet 0   ??? XIFAXAN 550 mg Tab Take 1 tablet (550 mg total) by mouth Two (2) times a day. 180 tablet 4   ??? sertraline (ZOLOFT) 50 MG tablet Take 1 tablet (50 mg total) by mouth daily. 30 tablet 0     No current facility-administered medications for this encounter.     Facility-Administered Medications Ordered in Other Encounters   Medication Dose Route Frequency Provider Last Rate Last Admin   ??? caffeine citrate (CAFCIT) injection   Intravenous PRN (once a day) Arbutus Leas, CRNA  60 mg at 08/06/21 1100   ??? propofoL (DIPRIVAN) injection   Intravenous PRN (once a day) Arbutus Leas, CRNA   130 mg at 08/06/21 1105   ??? sodium chloride (NS) 0.9 % infusion   Intravenous Continuous PRN Arbutus Leas, CRNA   Stopped at 08/06/21 1113   ??? succinylcholine (ANECTINE) injection   Intravenous PRN (once a day) Arbutus Leas, CRNA   120 mg at 08/06/21 1105          Neck Collar Used:  Yes     PROGRESS NOTE:     Alexis Burns is a 56 y.o. female who presents for outpatient index ECT. Treatments started in the inpatient setting, and she was discharged home on 08/03/2021.     Mood is better overall. Sleep is stable. Appetite is high; started Ozempic again, and hasn't noted any suppression. Energy and motivation are improving. Irritability is improving. Denies SI, HI, SIB, PDW. Anxiety is better. No perceptual disturbances, delusional thoughts, or referential content. Headache has been improving somewhat. Memory issue is a little worse but word-finding difficulty is improving. Has headache after each treatment, occasionally migrainous. Will increase caffeine.     Mental Status Exam:  Appearance:    Well nourished and Well developed   Behavior:  Calm and Cooperative   Motor:   No abnormal movements   Speech/Language:    Normal rate, volume, tone, fluency and Language intact, well formed   Mood:   better Affect:   Calm, Constricted, Euthymic, Mood congruent, and jovial   Thought process:   Logical, linear, clear, coherent, goal directed   Thought content:     No SI   Perceptual disturbances:     Denies auditory and visual hallucinations, behavior not concerning for response to internal stimuli     Orientation:   Oriented to person, place, time, and general circumstances   Attention:   Able to fully attend without fluctuations in consciousness   Concentration:   Able to fully concentrate and attend   Memory:    MMSE 25/30    Fund of knowledge:    Consistent with level of education and development   Insight:     Fair   Judgment:    Fair   Impulse Control:   Fair     Physical Exam:   Gen: NAD  Pulm: No increased work of breathing  Neuro: No tremors, tics, or abnormal movements    A/P: Alexis Burns is a 56 y.o. 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     Currently with LFRT placement, good response. Seizure was of adequate length, good amplitude, and fair post-ictal suppression.    Post-Treatment Complications: None    Recommendations for Future Treatments: Consider adding caffeine or flumazenil    Next Treatment Date: 08/10/2021 (prefers to cancel Wednesday)    Meds to take prior to ECT: None    Special Instructions: Do not take these medications after 6pm on the night before ECT: Klonopin/clonazepam     Assisted By: none    Laurin Coder, MD

## 2021-08-06 ENCOUNTER — Ambulatory Visit: Admit: 2021-08-06 | Discharge: 2021-08-07 | Payer: PRIVATE HEALTH INSURANCE

## 2021-08-06 ENCOUNTER — Encounter: Admit: 2021-08-06 | Discharge: 2021-08-07 | Payer: PRIVATE HEALTH INSURANCE

## 2021-08-06 MED ADMIN — promethazine (PHENERGAN) injection: INTRAVENOUS | @ 15:00:00 | Stop: 2021-08-06

## 2021-08-06 MED ADMIN — succinylcholine (ANECTINE) injection: INTRAVENOUS | @ 15:00:00 | Stop: 2021-08-06

## 2021-08-06 MED ADMIN — sodium chloride (NS) 0.9 % infusion: INTRAVENOUS | @ 15:00:00 | Stop: 2021-08-06

## 2021-08-06 MED ADMIN — propofoL (DIPRIVAN) injection: INTRAVENOUS | @ 15:00:00 | Stop: 2021-08-06

## 2021-08-06 MED ADMIN — caffeine citrate (CAFCIT) injection: INTRAVENOUS | @ 15:00:00 | Stop: 2021-08-06

## 2021-08-07 ENCOUNTER — Telehealth
Admit: 2021-08-07 | Discharge: 2021-08-08 | Payer: PRIVATE HEALTH INSURANCE | Attending: Student in an Organized Health Care Education/Training Program | Primary: Student in an Organized Health Care Education/Training Program

## 2021-08-07 MED ORDER — SERTRALINE 50 MG TABLET
ORAL_TABLET | Freq: Every day | ORAL | 2 refills | 30 days | Status: CP
Start: 2021-08-07 — End: 2021-11-05

## 2021-08-07 MED ORDER — HYDROXYZINE HCL 25 MG TABLET
ORAL_TABLET | Freq: Every day | ORAL | 2 refills | 30 days | Status: CP | PRN
Start: 2021-08-07 — End: ?

## 2021-08-07 NOTE — Unmapped (Addendum)
Thank you for coming to your appointment today!     1) Follow up: July 11th at 10:45 with Dr. Jiles Crocker  2) Continue Sertraline 50 mg daily   3) Continue hydroxyzine 25 mg by mouth daily as needed for anxiety    Follow-up instructions:  -- Please continue taking your medications as prescribed for your mental health.   -- Do not make changes to your medications, including taking more or less than prescribed, unless under the supervision of your physician. Be aware that some medications may make you feel worse if abruptly stopped  -- Please refrain from using illicit substances, as these can affect your mood and could cause anxiety or other concerning symptoms.   -- Seek further medical care for any increase in symptoms or new symptoms such as thoughts of wanting to hurt yourself or hurt others.     Contact info:  Life-threatening emergencies: call 911 or go to the nearest ER for medical or psychiatric attention.     Issues that need urgent attention but are not life threatening: call the clinic at [phone].    Non-urgent routine concerns, questions, and refill requests: please call the clinic for assistance, or you may call my voicemail at [phone], and I will reply within 2 business days.     Regarding appointments:  - If you need to cancel your appointment, we ask that you call [phone] at least 24 hours before your scheduled appointment.  - If for any reason you arrive 15 minutes later than your scheduled appointment time, you may not be seen and your visit may be rescheduled.  - Please remember that we will not automatically reschedule missed appointments.  - If you miss two (2) appointments without letting us know at least 24 hours in advance, you will likely be referred to a provider in your community.  - We will do our best to be on time. Sometimes an emergency will arise that might cause your clinician to be late. We will try to inform you of this when you check in for your appointment. If you wait more than 15 minutes past your appointment time without such notice, please speak with the front desk staff.    In the event of bad weather, the clinic staff will attempt to contact you, should your appointment need to be rescheduled. Additionally, you can call the Patient Weather Line [phone] for system-wide clinic status    For more information and reminders regarding clinic policies (these were provided when you were admitted to the clinic), please ask the front desk.

## 2021-08-07 NOTE — Unmapped (Signed)
Left message    This is [facility] Outpatient ECT service calling to schedule a follow up appointment for June 9th, 2023.     At your earliest convenience please call the office at [phone] to schedule an appointment.      Thank you  Marko Plume Department of Psychiatry  Out Patient ECT

## 2021-08-07 NOTE — Unmapped (Unsigned)
[facility]  Psychiatry   Established Patient E&M Service - Outpatient       Assessment:  Alexis Burns is a 56 y.o. female with a history of depression, hypertension, and anxiety, who presents for follow-up evaluation.     Alexis Burns presents to her appointment today endorsing improved mood with absence of suicidal ideation after a two week admission on [facility] where she completed 6 rounds of inpatient ECT for major depressive disorder. She is planning to continue ECT as an outpatient. Alexis Burns notes that this has been a positive change for her and she is working towards accepting challenging interpersonal relationships in her life. Advised that Alexis Burns continue sertraline as prescribed as well as PRN hydroxyzine. No plans to restart Klonopin at this time. No acute safety concerns. She will follow up with Dr. Osborn Coho on July 11th. Resident transition discussed.     Identifying Information: Alexis Burns is an ADTC patient since 2019 with a history of depression and anxiety, with past hospitalization in 2017 at [facility]. Past medication trials include Prozac (stopped working), risperidone (brief trial), lithium (intolerable side effects), buspirone (unhelpful at 20mg  TID), duloxetine (90mg  well-tolerated but not helpful on its own), aripiprazole (as adjunct for duloxetine, eventually stopped due to tremors), Wellbutrin (anxiety, dry mouth), Seroquel (ineffective), Cytomel (dry mouth, joint pain). Over several years until late 2020, she was fairly stable on duloxetine 90mg  and Abilify 5mg . She used to work in a [employer], [employer] pre-pandemic in a stressful role, took time off for a leg injury, and then when she went back to work she was fired. This was destabilizing, and it was closely followed by tibial tendon surgery in and then suspected COVID infection. By Feb 2021, mood remained unstable, but Abilify was causing significant shakiness even at tapered doses, and she discontinued it. She had an exacerbation of depression in May 2021 with her son's mental health as a major stressor, and after intolerance of increasing Cymbalta to 120mg , ineffectiveness of Seroquel, intolerance of Wellbutrin, and time-limited benefit of risperidone, in September 2021 she was admitted to the [facility] for suicidal ideation with a plan to shoot herself. They started Cytomel but she did not tolerate it. She noticed some abnormal tongue movements while on Abilify and these worsened after Abilify discontinuation. We considered the possibility of TD, but neurology evaluation labeled the tongue movements as tic-like, and they also diagnosed her with essential tremor. She started to show signs of liver dysfunction on her bloodwork, and in December 2021 we made the switch from duloxetine to Prozac. This was tolerated well and effective for mood, and mood has varied with family stressors. Both insomnia and hypersomnia have been problems for Alexis Burns, and prns have been very helpful, by spring 2022 sleep had improved with more physical activity. Alexis Burns was eventually restarted on Klonopin in Spring 2023 given severe anxiety/panic that caused her to be unable to leave her house for several weeks. Additionally, she experienced recurrence of depressive symptoms and passive suicidal ideation. Given how well she had done on an SNRI in the past, decision was made to cross taper Prozac to Pristiq. Given continued depressed mood, Alexis Burns was subsequently referred to interventional psychiatry ECT clinic in April 2023. Due to severity of symptoms, Alexis Burns was directly admitted to [facility] to start inpatient ECT and she was transitioned from Pristiq to sertraline. Klonopin was discontinued and Hydroxyzine PRN was started to target anxiety. She continued ECT as an outpatient.  Risk Assessment:  A suicide and violence risk assessment was performed at our last visit, and safety assessment remains uncharged: she is not at acute risk of harm to herself but is at chronic risk, and we will continue assessing safety at future evaluations.  Stressors: death of father in 09-12-2012 and mother in 13-Sep-2015, chronic depressive symptoms, financial stressors, unemployment, global pandemic and likely COVID19 infection, stressful relationships by son (improving).     Plan:  Problem #1: MDD:  Status: Chronic  Interventions:  -  Continue sertraline 50 mg daily   - Patient receiving weekly ECT with Sabina  - Continue weekly therapy with Alexis Burns    Problem #2: GAD:    Status of problem: chronic with acute exacerbation  - Continue hydroxyzine 25 mg daily as needed for anxiety     Problem #3: Insomnia and excessive daytime sleepiness  Status of problem:  improved or improving with exercise  Interventions:   - Patient previously reported benefit in mood/fatigue from Vyvanse, but have discussed that given lack of childhood ADHD diagnosis and without sleep study we are not inclined to re-initiate this medication    Problem #4: Abnormal tongue movements  Status of problem: stable  Interventions:  - Neurology labeled her tongue movements as tic-related, and suggested therapies to help. We had considered if this was a symptoms of tardive dyskinesia as it worsened after Abilify discontinuation which can happen. It originally diminished with stopping artifical sweeteners, but then came back. Neurology's assessment is reassuring  - MRI this afternoon per Neurology     Psychotherapy:  No billable psychotherapy service provided.    Patient has been given this writer's contact information as well as the [facility] Psychiatry urgent line number. The patient has been instructed to call 911 for emergencies.    Patient was seen and plan of care was discussed with the Attending MD, Dr. Gerrit Friends, who agrees with the above statement and plan.    Katherine Mantle, MD      Subjective:     Psychiatric Chief Concern:  Follow-up psychiatric evaluation for mood    Interval History:  Patient notes that she has been feeling very good since discharge from [facility]. She reports that she is still taking it easy, and trying to not let the stressors in her life affect her. Neli notes that she has been blocking out stressors at home. Denaisha notes that she is not going to accept the challenges of others in the family. Patient notes that sertraline continues to be helpful. She is planning to reach out to [facility] to discuss how many sessions of ECT she has left.  No thoughts of harm to self or others. She is using hydroxyzine infrequently, maybe once or twice a day. She is scheduled to [facility] for therapy on the 23rd. Alexis Burns (husbadn) has been a good support. Discussed resident transition to Dr. Osborn Coho.     Patient reports that at the beginning of last week, she had one really good day. Patient notes that she has been sad angry, dissapointed and hopeless since.  Patient has been sleeping fine. Patient notes that she continued to cry at home. Part of last week, was deciding that she would not be speaking with her sisters because they don't support her. Her sister confronted her about her mood, and patient began to talk about events from her childhood. Notes that this sister does not recall events about their childhood in the same was Alexis Burns does.     Objective:  Mental Status Exam:  Appearance:    Appears stated age, Well nourished and Clean/Neat, sitting in chair in front of screen   Motor:   No abnormal movements   Speech/Language:    Normal rate, volume, tone, fluency and Language intact, well formed   Mood:   very good   Affect:   Calm, flat but relatively euthymic   Thought process and Associations:   Logical, linear, clear, coherent, goal directed   Abnormal/psychotic thought content:     Denies SI/HI   Perceptual disturbances:     Denies auditory and visual hallucinations, behavior not concerning for response to internal stimuli     Other:   -     Visit was completed by video (or phone) and the appropriate disclaimer has been included below.        The patient reports they are currently: at home. I spent 20 minutes on the real-time audio and video with the patient on the date of service. I spent an additional 10 minutes on pre- and post-visit activities on the date of service.     The patient was physically located in [place] or a state in which I am permitted to provide care. The patient and/or parent/guardian understood that s/he may incur co-pays and cost sharing, and agreed to the telemedicine visit. The visit was reasonable and appropriate under the circumstances given the patient's presentation at the time.    The patient and/or parent/guardian has been advised of the potential risks and limitations of this mode of treatment (including, but not limited to, the absence of in-person examination) and has agreed to be treated using telemedicine. The patient's/patient's family's questions regarding telemedicine have been answered.     If the visit was completed in an ambulatory setting, the patient and/or parent/guardian has also been advised to contact their provider???s office for worsening conditions, and seek emergency medical treatment and/or call 911 if the patient deems either necessary. and post-visit activities on the date of service.     The patient was physically located in [place] or a state in which I am permitted to provide care. The patient and/or parent/guardian understood that s/he may incur co-pays and cost sharing, and agreed to the telemedicine visit. The visit was reasonable and appropriate under the circumstances given the patient's presentation at the time.    The patient and/or parent/guardian has been advised of the potential risks and limitations of this mode of treatment (including, but not limited to, the absence of in-person examination) and has agreed to be treated using telemedicine. The patient's/patient's family's questions regarding telemedicine have been answered.     If the visit was completed in an ambulatory setting, the patient and/or parent/guardian has also been advised to contact their provider???s office for worsening conditions, and seek emergency medical treatment and/or call 911 if the patient deems either necessary.

## 2021-08-08 ENCOUNTER — Ambulatory Visit
Admit: 2021-08-08 | Discharge: 2021-08-09 | Payer: PRIVATE HEALTH INSURANCE | Attending: Family Medicine | Primary: Family Medicine

## 2021-08-08 MED ORDER — OZEMPIC 1 MG/DOSE (4 MG/3 ML) SUBCUTANEOUS PEN INJECTOR
SUBCUTANEOUS | 2 refills | 84 days | Status: CP
Start: 2021-08-08 — End: 2021-11-06
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
## 2021-08-08 NOTE — Unmapped (Signed)
Alexis Burns is a 56 y.o. female with Class 3 obesity due to weight gain associated with unhealthy lifestyle, including physical inactivity and frequent consumption of ultraprocessed foods and products with high amounts of added sugars, including Mt. Dew. Additionally, Pt gained weight with smoking cessation and post-partum weight retention. Comorbidities include prediabetes, hyperlipidemia, hypertension, cirrhosis, depression. Barriers include BED, depression/anxiety, ankle and back pain.    Weight Summary:  Starting weight/BMI/WC/[place]: 245 lbs, BMI 40.77, WC 44, [place] 14 (04/05/2021)  Target weight/goal: No goal weight. Patient wants general improvement in health and appearance.  Today's weight/BMI: (!) 108 kg (238 lb 1.3 oz), BMI (!) 38.45 (08/08/2021)  % Body weight loss: N/A  Today's Waist Circumference: 44.8 inches (08/08/2021)  Today's visit number: 3rd  Duration in program: 4 mos    Referred by: Self, Referred    GOALS: 1) Walk 10 mins per day  2) Continue healthy eating habits, try to limit chips/pretzels/trail mix  3) Increase Ozempic    I have reviewed the patient's medical history, lifestyle history and labs/tests.   My recommendations include the following:    Lifestyle Pattern Summary: Pt is making some healthy choices including making sandwiches on whole grain/nut bread. Room for improvement with chips and trailmix as snack. We discussed healthier snack alternatives such as popcorn or nuts.   - See GOALS above.     Medication: Taking Ozempic as directed. Tolerating well. Due to improved moods, discussed and decided to increase dose of Ozempic. Rx sent. Pt verbalized understanding and agreement with plan.    INCREASE Ozempic (semaglutide) 0.5 mg (36 clicks on 1 mg pen) --> 1.0 mg once weekly injections  - Metformin: started 04/05/2021 at 245 lbs.  Tried/Contraindicated:  - Wellbutrin: CI d/t anxiety  - Topiramate: CI d/t Hx of nephroliathisis (confirmed by CT)     Obesity Surgery: Declines for now. Wishes to try medical management but will consider in the future. Pt has had ~10 surgeries including C-section x2, cholecystectomy, hysterectomy & other musculoskeletal surgeries. Pt will keep all options open, especially due to multiple comorbidities including cirrhosis. (Updated 04/05/2021)    Medical conditions:  Glaucoma: no  Seizures: no  Medullary thyroid cancer (personal or family hx): no  Multiple Endocrine Neoplasia: no  Palpitations/Tachycardia: no  Chest Pain: no past history of MI.   Headaches/Migraines: YES  Nephrolithiasis: YES  H/o pancreatitis: YES -- gallstone pancreatitis 35 yrs ago  GERD: no     Prior Surgeries:  Cholecystectomy: YES  Hysterectomy: YES    Birth Control Methods: N/A

## 2021-08-09 NOTE — Unmapped (Signed)
Hx of preDM. Reviewed relevant medications and labs. Pt is following our [facility]. See Obesity tab for medication changes.

## 2021-08-10 ENCOUNTER — Encounter: Admit: 2021-08-10 | Discharge: 2021-08-11 | Payer: PRIVATE HEALTH INSURANCE

## 2021-08-10 ENCOUNTER — Ambulatory Visit: Admit: 2021-08-10 | Discharge: 2021-08-11 | Payer: PRIVATE HEALTH INSURANCE

## 2021-08-10 MED ADMIN — caffeine citrate (CAFCIT) injection: INTRAVENOUS | @ 16:00:00 | Stop: 2021-08-10

## 2021-08-10 MED ADMIN — sodium chloride (NS) 0.9 % infusion: INTRAVENOUS | @ 16:00:00 | Stop: 2021-08-10

## 2021-08-10 MED ADMIN — ondansetron (ZOFRAN) injection: INTRAVENOUS | @ 16:00:00 | Stop: 2021-08-10

## 2021-08-10 MED ADMIN — propofoL (DIPRIVAN) injection: INTRAVENOUS | @ 16:00:00 | Stop: 2021-08-10

## 2021-08-10 MED ADMIN — succinylcholine (ANECTINE) injection: INTRAVENOUS | @ 16:00:00 | Stop: 2021-08-10

## 2021-08-10 MED ADMIN — promethazine (PHENERGAN) injection: INTRAVENOUS | @ 16:00:00 | Stop: 2021-08-10

## 2021-08-10 NOTE — Unmapped (Signed)
[facility] ECT               Procedure Note                                             Requesting Attending Physician: Unknown Per Patient Refe*  Admit Date: 08/10/2021     Service Type: Inpatient  Requesting Attending Physician: Unknown Per Patient Refe*  Service requesting consult: Psychiatry  Consulting service: Psychiatry  Admit Date: 08/10/2021  Service Date: August 10, 2021      Time out was taken with staff to confirm correct patient and correct procedure to be performed.    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 No change    Beck Depression Inventory Total Score:     Baseline: 51  Most recent: 17    Mini-Mental Status ExamTotal Score:     Baseline: 25  Most recent: 30     Bush-Francis CatatoniaTotal Score:       MEDICAL INFORMATION:    Allergies:  Haldol [haloperidol lactate], Cyclosporine, Tetracycline, and Tramadol    Medical History: See ECT Consult    Surgical History: See ECT Consult    VITAL SIGNS:   VITALS Blood Pressure Pulse   Pre-Procedure 172/107 68   Post-Procedure 185/109        THYMATRON:  ECT # Thymatron Settings (%) Modification Seizure   Length Cuff Duration  (sec) EEG Duration  (sec)   1 85 Good Adequate 30 44         Medications given during procedure:   Current Outpatient Medications   Medication Sig Dispense Refill    albuterol HFA 90 mcg/actuation inhaler Inhale 2 puffs every six (6) hours as needed for wheezing. 8 g 0    carboxymethylcellulose sodium (THERATEARS) 0.25 % Drop Administer 2 drops to both eyes four (4) times a day. 15 mL 0    hydrOXYzine (ATARAX) 25 MG tablet Take 1 tablet (25 mg total) by mouth daily as needed for anxiety. 30 tablet 2    semaglutide (OZEMPIC) 1 mg/dose (4 mg/3 mL) PnIj injection Inject 1 mg under the skin every seven (7) days. 9 mL 2    sertraline (ZOLOFT) 50 MG tablet Take 1 tablet (50 mg total) by mouth daily. 30 tablet 2    XIFAXAN 550 mg Tab Take 1 tablet (550 mg total) by mouth Two (2) times a day. 180 tablet 4     No current facility-administered medications for this encounter.     Facility-Administered Medications Ordered in Other Encounters   Medication Dose Route Frequency Provider Last Rate Last Admin    caffeine citrate (CAFCIT) injection   Intravenous PRN (once a day) Renaye Rakers, MD   90 mg at 08/10/21 1203    promethazine (PHENERGAN) injection   Intravenous PRN (once a day) Renaye Rakers, MD   6.25 mg at 08/10/21 1203    sodium chloride (NS) 0.9 % infusion   Intravenous Continuous PRN Renaye Rakers, MD   New Bag at 08/10/21 1157          Neck Collar  Used:  Yes     PROGRESS NOTE:     Alexis Burns is a 56 y.o. female who presents for outpatient index ECT. Treatments started in the inpatient setting, and she was discharged home on 08/03/2021.     Reports feeling significant improvement with ECT. Reports this may be the first time she has felt not depressed. Denies side cognitive side effects but did endorse jaw pain after last session and some headache. Discussed increasing caffeine for this session and patient was amenable. Patient plans to spend time in her RV at [place] after treatment.     Mental Status Exam:  Appearance:    Well nourished and Well developed   Behavior:  Calm and Cooperative   Motor:   No abnormal movements   Speech/Language:    Normal rate, volume, tone, fluency and Language intact, well formed   Mood:   This is the first time I can say that I'm not really depressed   Affect:   Calm, Constricted, Euthymic, Mood congruent, and jovial   Thought process:   Logical, linear, clear, coherent, goal directed   Thought content:     No SI   Perceptual disturbances:     Denies auditory and visual hallucinations, behavior not concerning for response to internal stimuli   Orientation:   Oriented to person, place, time, and general circumstances   Attention:   Able to fully attend without fluctuations in consciousness   Concentration:   Able to fully concentrate and attend     Physical Exam:   Gen: NAD  Pulm: No increased work of breathing  Neuro: No tremors, tics, or abnormal movements    A/P: Alexis Burns is a 56 y.o. 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     Currently with LFRT placement, good response. Increased caffeine today to help with post-ECT headache. Seizure was of adequate length, good amplitude, and good post-ictal suppression.    Post-Treatment Complications: None    Recommendations for Future Treatments: none    Next Treatment Date: 08/13/2021     Meds to take prior to ECT: None    Special Instructions: Do not take these medications after 6pm on the night before ECT: Klonopin/clonazepam     Assisted By: Donia Ast, MD    Laurin Coder, MD

## 2021-08-12 NOTE — Unmapped (Deleted)
[facility]               Procedure Note                                             Requesting Attending Physician: No att. providers found  Admit Date: (Not on file)     Service Type: Inpatient  Requesting Attending Physician: No att. providers found  Service requesting consult: Psychiatry  Consulting service: Psychiatry  Admit Date: (Not on file)  Service Date: August 12, 2021      Time out was taken with staff to confirm correct patient and correct procedure to be performed.    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 Much improved    Beck Depression Inventory Total Score:     Baseline: 51  Most recent: 19    Mini-Mental Status ExamTotal Score:     Baseline: 25  Most recent: 30    MEDICAL INFORMATION:    Allergies: Haldol [haloperidol lactate], Cyclosporine, Tetracycline, and Tramadol    Medical History: See ECT Consult    Surgical History: See ECT Consult    VITAL SIGNS:   VITALS Blood Pressure Pulse   Pre-Procedure 128/102 74   Post-Procedure 128/106 95       THYMATRON:  ECT # Thymatron Settings (%) Modification Seizure   Length Cuff Duration  (sec) EEG Duration  (sec)   1 85 Good Marginal 16 23         Medications given during procedure:   Current Outpatient Medications   Medication Sig Dispense Refill    albuterol HFA 90 mcg/actuation inhaler Inhale 2 puffs every six (6) hours as needed for wheezing. 8 g 0    carboxymethylcellulose sodium (THERATEARS) 0.25 % Drop Administer 2 drops to both eyes four (4) times a day. 15 mL 0    hydrOXYzine (ATARAX) 25 MG tablet Take 1 tablet (25 mg total) by mouth daily as needed for anxiety. 30 tablet 2    semaglutide (OZEMPIC) 1 mg/dose (4 mg/3 mL) PnIj injection Inject 1 mg under the skin every seven (7) days. 9 mL 2    sertraline (ZOLOFT) 50 MG tablet Take 1 tablet (50 mg total) by mouth daily. 30 tablet 2    XIFAXAN 550 mg Tab Take 1 tablet (550 mg total) by mouth Two (2) times a day. 180 tablet 4     No current facility-administered medications for this encounter.     Facility-Administered Medications Ordered in Other Encounters   Medication Dose Route Frequency Provider Last Rate Last Admin    caffeine citrate (CAFCIT) injection   Intravenous PRN (once a day) Joshua Pyant, DO   60 mg at 08/13/21 1216    labetaloL (NORMODYNE,TRANDATE) injection   Intravenous PRN (once a day) Alexis Goodell, DO   10 mg at 08/13/21 1223    ondansetron (ZOFRAN-ODT) disintegrating tablet   Oral PRN (once a day) Alexis Goodell, DO   4 mg at 08/13/21 1216    promethazine (PHENERGAN) injection   Intravenous PRN (once a day) Alexis Goodell, DO  6.25 mg at 08/13/21 1216    propofoL (DIPRIVAN) injection   Intravenous PRN (once a day) [facility], DO   130 mg at 08/13/21 1217    sodium chloride (NS) 0.9 % infusion   Intravenous Continuous PRN Alexis Goodell, DO   New Bag at 08/13/21 1208    succinylcholine (ANECTINE) injection   Intravenous PRN (once a day) Alexis Goodell, DO   120 mg at 08/13/21 1221          Neck Collar Used:  Yes     PROGRESS NOTE:     Alexis Burns is a 56 y.o. female who presents for outpatient index ECT. Treatments started in the inpatient setting, and she was discharged home on 08/03/2021.     Mood is good overall. Feels she's improving with each treatment. I have my life back. Enjoyed camping trip over the weekend, and has also started to reconnect with her son, which is going well. Sleep was difficult the night of her last treatment, possibly due to increased caffeine. Appetite is stable. Energy and motivation are improving. No SI, HI, SIB, PDW. Noted no other new side effects after last treatment, though she did bite down on the right side of her tongue.    Mental Status Exam:  Appearance:    Well nourished and Well developed   Behavior:  Calm and Cooperative   Motor:   No abnormal movements   Speech/Language:    Normal rate, volume, tone, fluency and Language intact, well formed   Mood:   good   Affect:   Calm, Cooperative, Euthymic, and Mood congruent   Thought process:   Logical, linear, clear, coherent, goal directed   Thought content:     No SI   Perceptual disturbances:     Denies auditory and visual hallucinations, behavior not concerning for response to internal stimuli   Orientation:   Oriented to person, place, time, and general circumstances   Attention:   Able to fully attend without fluctuations in consciousness   Concentration:   Able to fully concentrate and attend     Physical Exam:   Gen: NAD  Pulm: No increased work of breathing  Neuro: No tremors, tics, or abnormal movements    Assessment and Plan:   Mood Disorder: Maj Depress D/O, 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     Interventions Today:    Medication adjustments:  decreased caffeine to 60 mg IV due to restlessness and sleep interruption  Energy adjustments: None  Used disposable bite block due to tongue injury during last treatment. A small spot of the old wound reopened today    Post-Treatment Complications: None    Recommendations for Future Treatments:    Medication adjustments: None  Energy adjustments: None  Frequency: return in  2 days for outpatient index treatment    Next Treatment Date: 08/15/2021    Meds to take prior to ECT: None    Special Instructions: Do not take these medications after 6pm on the night before ECT: Klonopin/clonazepam    Assisted by: none    Laurin Coder, MD

## 2021-08-13 ENCOUNTER — Encounter
Admit: 2021-08-13 | Discharge: 2021-08-14 | Payer: PRIVATE HEALTH INSURANCE | Attending: Anesthesiology | Primary: Anesthesiology

## 2021-08-13 ENCOUNTER — Ambulatory Visit: Admit: 2021-08-13 | Discharge: 2021-08-14 | Payer: PRIVATE HEALTH INSURANCE

## 2021-08-13 MED ADMIN — labetaloL (NORMODYNE,TRANDATE) injection: INTRAVENOUS | @ 16:00:00 | Stop: 2021-08-13

## 2021-08-13 MED ADMIN — sodium chloride (NS) 0.9 % infusion: INTRAVENOUS | @ 16:00:00 | Stop: 2021-08-13

## 2021-08-13 MED ADMIN — ondansetron (ZOFRAN-ODT) disintegrating tablet: ORAL | @ 16:00:00 | Stop: 2021-08-13

## 2021-08-13 MED ADMIN — succinylcholine (ANECTINE) injection: INTRAVENOUS | @ 16:00:00 | Stop: 2021-08-13

## 2021-08-13 MED ADMIN — propofoL (DIPRIVAN) injection: INTRAVENOUS | @ 16:00:00 | Stop: 2021-08-13

## 2021-08-13 MED ADMIN — promethazine (PHENERGAN) injection: INTRAVENOUS | @ 16:00:00 | Stop: 2021-08-13

## 2021-08-13 MED ADMIN — caffeine citrate (CAFCIT) injection: INTRAVENOUS | @ 16:00:00 | Stop: 2021-08-13

## 2021-08-13 NOTE — Unmapped (Signed)
[facility] Clinical Assessment & Refill Coordination Note    Alexis Burns, [place]: 1965/09/14  Phone: [phone] (home)     All above HIPAA information was verified with patient.     Was a Nurse, learning disability used for this call? No    Specialty Medication(s):   Infectious Disease: Xifaxan     Current Outpatient Medications   Medication Sig Dispense Refill    albuterol HFA 90 mcg/actuation inhaler Inhale 2 puffs every six (6) hours as needed for wheezing. 8 g 0    carboxymethylcellulose sodium (THERATEARS) 0.25 % Drop Administer 2 drops to both eyes four (4) times a day. 15 mL 0    hydrOXYzine (ATARAX) 25 MG tablet Take 1 tablet (25 mg total) by mouth daily as needed for anxiety. 30 tablet 2    semaglutide (OZEMPIC) 1 mg/dose (4 mg/3 mL) PnIj injection Inject 1 mg under the skin every seven (7) days. 9 mL 2    sertraline (ZOLOFT) 50 MG tablet Take 1 tablet (50 mg total) by mouth daily. 30 tablet 2    XIFAXAN 550 mg Tab Take 1 tablet (550 mg total) by mouth Two (2) times a day. 180 tablet 4     No current facility-administered medications for this visit.        Changes to medications: Alexis Burns reports no changes at this time.    Allergies   Allergen Reactions    Haldol [Haloperidol Lactate] Anxiety    Cyclosporine Nausea And Vomiting    Tetracycline Nausea Only    Tramadol Itching       Changes to allergies: No    SPECIALTY MEDICATION ADHERENCE     Xifaxan 550 mg: 10 days of medicine on hand        Medication Adherence    Patient reported X missed doses in the last month: 1  Specialty Medication: Xifaxan          Specialty medication(s) dose(s) confirmed: Regimen is correct and unchanged.     Are there any concerns with adherence? No - patient was hospitalized for 2 weeks in the last month and did not use her home supply.    Adherence counseling provided? Not needed    CLINICAL MANAGEMENT AND INTERVENTION      Clinical Benefit Assessment:    Do you feel the medicine is effective or helping your condition? Patient declined to answer    Clinical Benefit counseling provided? Not needed    Adverse Effects Assessment:    Are you experiencing any side effects? No    Are you experiencing difficulty administering your medicine? No    Quality of Life Assessment:    Quality of Life    Rheumatology  Oncology  Dermatology  Cystic Fibrosis          How many days over the past month did your hepatic cirrhosis  keep you from your normal activities? For example, brushing your teeth or getting up in the morning. 0    Have you discussed this with your provider? Not needed    Acute Infection Status:    Acute infections noted within Epic:  No active infections  Patient reported infection: None    Therapy Appropriateness:    Is therapy appropriate and patient progressing towards therapeutic goals? Yes, therapy is appropriate and should be continued    DISEASE/MEDICATION-SPECIFIC INFORMATION      N/A    PATIENT SPECIFIC NEEDS     Does the patient have any physical, cognitive, or  cultural barriers? No    Is the patient high risk? No    Does the patient require a Care Management Plan? No     SOCIAL DETERMINANTS OF HEALTH     At the [facility], we have learned that life circumstances - like trouble affording food, housing, utilities, or transportation can affect the health of many of our patients.   That is why we wanted to ask: are you currently experiencing any life circumstances that are negatively impacting your health and/or quality of life? Patient declined to answer    Social Determinants of Health     Financial Resource Strain: Low Risk     Difficulty of Paying Living Expenses: Not very hard   Internet Connectivity: Not on file   Food Insecurity: No Food Insecurity    Worried About Programme researcher, broadcasting/film/video in the Last Year: Never true    Barista in the Last Year: Never true   Tobacco Use: Medium Risk    Smoking Tobacco Use: Former    Smokeless Tobacco Use: Never    Passive Exposure: Not on file Housing/Utilities: Low Risk     Within the past 12 months, have you ever stayed: outside, in a car, in a tent, in an overnight shelter, or temporarily in someone else's home (i.e. couch-surfing)?: No    Are you worried about losing your housing?: No    Within the past 12 months, have you been unable to get utilities (heat, electricity) when it was really needed?: No   Alcohol Use: Not At Risk    How often do you have a drink containing alcohol?: Never    How many drinks containing alcohol do you have on a typical day when you are drinking?: 1 - 2    How often do you have 5 or more drinks on one occasion?: Never   Transportation Needs: No Transportation Needs    Lack of Transportation (Medical): No    Lack of Transportation (Non-Medical): No   Substance Use: Not on file   Health Literacy: Low Risk     : Never   Physical Activity: Not on file   Interpersonal Safety: Not on file   Stress: Not on file   Intimate Partner Violence: Not on file   Depression: Not at risk    PHQ-2 Score: 2   Social Connections: Not on file       Would you be willing to receive help with any of the needs that you have identified today? Not applicable       SHIPPING     Specialty Medication(s) to be Shipped:   Infectious Disease: Xifaxan    Other medication(s) to be shipped: No additional medications requested for fill at this time     Changes to insurance: No    Delivery Scheduled: Yes, Expected medication delivery date: 6/16.     Medication will be delivered via Next Day Courier to the confirmed prescription address in [facility].    The patient will receive a drug information handout for each medication shipped and additional FDA Medication Guides as required.  Verified that patient has previously received a Conservation officer, historic buildings and a Surveyor, mining.    The patient or caregiver noted above participated in the development of this care plan and knows that they can request review of or adjustments to the care plan at any time.      All of the patient's questions and concerns have been addressed.  Clydell Hakim   [facility] Specialty Pharmacist

## 2021-08-13 NOTE — Unmapped (Signed)
This was a telehealth service where a resident was involved. I spent 10 minutes reviewing the patient's medical record and discussing the evaluation and treatment recommendations with the resident.  I reviewed and edited the resident's note.  I agree with the resident's findings and plan.    Dory Peru, MD

## 2021-08-13 NOTE — Unmapped (Signed)
[facility]               Procedure Note                                             Requesting Attending Physician: Unknown Per Patient Refe*  Admit Date: 08/13/2021     Service Type: Inpatient  Requesting Attending Physician: Unknown Per Patient Refe*  Service requesting consult: Psychiatry  Consulting service: Psychiatry  Admit Date: 08/13/2021  Service Date: August 13, 2021      Time out was taken with staff to confirm correct patient and correct procedure to be performed.    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 Much improved    Beck Depression Inventory Total Score:     Baseline: 51  Most recent: 19    Mini-Mental Status ExamTotal Score:     Baseline: 25  Most recent: 30    MEDICAL INFORMATION:    Allergies: Haldol [haloperidol lactate], Cyclosporine, Tetracycline, and Tramadol    Medical History: See ECT Consult    Surgical History: See ECT Consult    VITAL SIGNS:   VITALS Blood Pressure Pulse   Pre-Procedure 128/102 74   Post-Procedure 128/106 95       THYMATRON:  ECT # Thymatron Settings (%) Modification Seizure   Length Cuff Duration  (sec) EEG Duration  (sec)   1 85 Good Marginal 16 23         Medications given during procedure:   Current Outpatient Medications   Medication Sig Dispense Refill    albuterol HFA 90 mcg/actuation inhaler Inhale 2 puffs every six (6) hours as needed for wheezing. 8 g 0    carboxymethylcellulose sodium (THERATEARS) 0.25 % Drop Administer 2 drops to both eyes four (4) times a day. 15 mL 0    hydrOXYzine (ATARAX) 25 MG tablet Take 1 tablet (25 mg total) by mouth daily as needed for anxiety. 30 tablet 2    semaglutide (OZEMPIC) 1 mg/dose (4 mg/3 mL) PnIj injection Inject 1 mg under the skin every seven (7) days. 9 mL 2    sertraline (ZOLOFT) 50 MG tablet Take 1 tablet (50 mg total) by mouth daily. 30 tablet 2    XIFAXAN 550 mg Tab Take 1 tablet (550 mg total) by mouth Two (2) times a day. 180 tablet 4     No current facility-administered medications for this encounter.     Facility-Administered Medications Ordered in Other Encounters   Medication Dose Route Frequency Provider Last Rate Last Admin    caffeine citrate (CAFCIT) injection   Intravenous PRN (once a day) Joshua Pyant, DO   60 mg at 08/13/21 1216    labetaloL (NORMODYNE,TRANDATE) injection   Intravenous PRN (once a day) Alexis Goodell, DO   10 mg at 08/13/21 1223    ondansetron (ZOFRAN-ODT) disintegrating tablet   Oral PRN (once a day) Alexis Goodell, DO   4 mg at 08/13/21 1216    promethazine (PHENERGAN) injection   Intravenous PRN (once a day) Alexis Goodell, DO   6.25 mg at 08/13/21  1216    propofoL (DIPRIVAN) injection   Intravenous PRN (once a day) Joshua Pyant, DO   130 mg at 08/13/21 1217    sodium chloride (NS) 0.9 % infusion   Intravenous Continuous PRN Alexis Goodell, DO   New Bag at 08/13/21 1208    succinylcholine (ANECTINE) injection   Intravenous PRN (once a day) [facility], DO   120 mg at 08/13/21 1221          Neck Collar Used:  Yes     PROGRESS NOTE:     Alexis Burns is a 56 y.o. female who presents for outpatient index ECT. Treatments started in the inpatient setting, and she was discharged home on 08/03/2021.     Mood is good overall. Feels she's improving with each treatment. I have my life back. Enjoyed camping trip over the weekend, and has also started to reconnect with her son, which is going well. Sleep was difficult the night of her last treatment, possibly due to increased caffeine. Appetite is stable. Energy and motivation are improving. No SI, HI, SIB, PDW. Noted no other new side effects after last treatment, though she did bite down on the right side of her tongue.    Mental Status Exam:  Appearance: Well nourished and Well developed   Behavior:  Calm and Cooperative   Motor:   No abnormal movements   Speech/Language:    Normal rate, volume, tone, fluency and Language intact, well formed   Mood:   good   Affect:   Calm, Cooperative, Euthymic, and Mood congruent   Thought process:   Logical, linear, clear, coherent, goal directed   Thought content:     No SI   Perceptual disturbances:     Denies auditory and visual hallucinations, behavior not concerning for response to internal stimuli   Orientation:   Oriented to person, place, time, and general circumstances   Attention:   Able to fully attend without fluctuations in consciousness   Concentration:   Able to fully concentrate and attend     Physical Exam:   Gen: NAD  Pulm: No increased work of breathing  Neuro: No tremors, tics, or abnormal movements    Assessment and Plan:   Mood Disorder: Maj Depress D/O, 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     Interventions Today:    Medication adjustments:  decreased caffeine to 60 mg IV due to restlessness and sleep interruption  Energy adjustments: None  Used disposable bite block due to tongue injury during last treatment. A small spot of the old wound reopened today    Post-Treatment Complications: None    Recommendations for Future Treatments:    Medication adjustments: None  Energy adjustments: None  Frequency: return in  2 days for outpatient index treatment    Next Treatment Date: 08/15/2021    Meds to take prior to ECT: None    Special Instructions: Do not take these medications after 6pm on the night before ECT: Klonopin/clonazepam    Assisted by: none    Laurin Coder, MD

## 2021-08-15 ENCOUNTER — Encounter: Admit: 2021-08-15 | Discharge: 2021-08-16 | Payer: PRIVATE HEALTH INSURANCE

## 2021-08-15 ENCOUNTER — Ambulatory Visit: Admit: 2021-08-15 | Discharge: 2021-08-16 | Payer: PRIVATE HEALTH INSURANCE

## 2021-08-15 MED ADMIN — sodium chloride (NS) 0.9 % infusion: INTRAVENOUS | @ 16:00:00 | Stop: 2021-08-15

## 2021-08-15 MED ADMIN — caffeine citrate (CAFCIT) injection: INTRAVENOUS | @ 16:00:00 | Stop: 2021-08-15

## 2021-08-15 MED ADMIN — succinylcholine (ANECTINE) injection: INTRAVENOUS | @ 16:00:00 | Stop: 2021-08-15

## 2021-08-15 MED ADMIN — propofoL (DIPRIVAN) injection: INTRAVENOUS | @ 16:00:00 | Stop: 2021-08-15

## 2021-08-15 MED ADMIN — acetaminophen (OFIRMEV) 10 mg/mL injection: INTRAVENOUS | @ 16:00:00 | Stop: 2021-08-15

## 2021-08-15 MED ADMIN — ondansetron (ZOFRAN) injection: INTRAVENOUS | @ 16:00:00 | Stop: 2021-08-15

## 2021-08-15 MED ADMIN — promethazine (PHENERGAN) injection: INTRAVENOUS | @ 16:00:00 | Stop: 2021-08-15

## 2021-08-15 NOTE — Unmapped (Incomplete)
[facility]               Procedure Note                                             Requesting Attending Physician: No att. providers found  Admit Date: (Not on file)     Service Type: Inpatient  Requesting Attending Physician: No att. providers found  Service requesting consult: Psychiatry  Consulting service: Psychiatry  Admit Date: (Not on file)  Service Date: August 14, 2021      Time out was taken with staff to confirm correct patient and correct procedure to be performed.    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 Much improved    Beck Depression Inventory Total Score:     Baseline: 51  Most recent: 19    Mini-Mental Status ExamTotal Score:     Baseline: 25  Most recent: 30    MEDICAL INFORMATION:    Allergies: Haldol [haloperidol lactate], Cyclosporine, Tetracycline, and Tramadol    Medical History: See ECT Consult    Surgical History: See ECT Consult    VITAL SIGNS:      Pre-procedure:    There were no vitals filed for this visit.  ***    Post-stimulus:     No data found.  ***    THYMATRON:  ECT # Thymatron Settings (%) Modification Seizure   Length Cuff Duration  (sec) EEG Duration  (sec)   1 90 Good Adequate *** ***         Medications given during procedure:   Current Outpatient Medications   Medication Sig Dispense Refill    albuterol HFA 90 mcg/actuation inhaler Inhale 2 puffs every six (6) hours as needed for wheezing. 8 g 0    carboxymethylcellulose sodium (THERATEARS) 0.25 % Drop Administer 2 drops to both eyes four (4) times a day. 15 mL 0    hydrOXYzine (ATARAX) 25 MG tablet Take 1 tablet (25 mg total) by mouth daily as needed for anxiety. 30 tablet 2    semaglutide (OZEMPIC) 1 mg/dose (4 mg/3 mL) PnIj injection Inject 1 mg under the skin every seven (7) days. 9 mL 2    sertraline (ZOLOFT) 50 MG tablet Take 1 tablet (50 mg total) by mouth daily. 30 tablet 2    XIFAXAN 550 mg Tab Take 1 tablet (550 mg total) by mouth Two (2) times a day. 180 tablet 4     No current facility-administered medications for this visit.          Neck Collar Used:  Yes     PROGRESS NOTE:     Alexis Burns is a 56 y.o. female who presents for outpatient index ECT. Treatments started in the inpatient setting, and she was discharged home on 08/03/2021.     Mood is good overall. Feels she's improving with each treatment. I have my life back. Enjoyed camping trip over the weekend, and has also started to reconnect with her son, which is going well. Sleep was difficult the night of her last  treatment, possibly due to increased caffeine. Appetite is stable. Energy and motivation are improving. No SI, HI, SIB, PDW. Noted no other new side effects after last treatment, though she did bite down on the right side of her tongue.    Mental Status Exam:  Appearance:    Well nourished and Well developed   Behavior:  Calm and Cooperative   Motor:   No abnormal movements   Speech/Language:    Normal rate, volume, tone, fluency and Language intact, well formed   Mood:   good   Affect:   Calm, Cooperative, Euthymic, and Mood congruent   Thought process:   Logical, linear, clear, coherent, goal directed   Thought content:     No SI   Perceptual disturbances:     Denies auditory and visual hallucinations, behavior not concerning for response to internal stimuli   Orientation:   Oriented to person, place, time, and general circumstances   Attention:   Able to fully attend without fluctuations in consciousness   Concentration:   Able to fully concentrate and attend     Physical Exam:   Gen: NAD  Pulm: No increased work of breathing  Neuro: No tremors, tics, or abnormal movements    Assessment and Plan:   Mood Disorder: Maj Depress D/O, REC Severe WITHOUT psychotic behavior: improving with index ECT    Seizure was of marginal length, good amplitude, fair morphology, and poor post-ictal suppression.     Interventions Today:    Medication adjustments:     Energy adjustments: None  Used disposable bite block due to tongue injury during last treatment.    Post-Treatment Complications: None    Recommendations for Future Treatments:    Medication adjustments: None  Energy adjustments: None  Frequency: return in  2 days for outpatient index treatment    Next Treatment Date: 08/20/2021 ***    Meds to take prior to ECT: None    Special Instructions: Do not take these medications after 6pm on the night before ECT: Klonopin/clonazepam    Assisted by: none    Iran Planas, MD

## 2021-08-16 MED FILL — XIFAXAN 550 MG TABLET: ORAL | 30 days supply | Qty: 60 | Fill #1

## 2021-08-21 NOTE — Unmapped (Signed)
[facility]               Procedure Note                                             Requesting Attending Physician: No att. providers found  Admit Date: (Not on file)     Service Type: Inpatient  Requesting Attending Physician: No att. providers found  Service requesting consult: Psychiatry  Consulting service: Psychiatry  Admit Date: (Not on file)  Service Date: August 21, 2021      Time out was taken with staff to confirm correct patient and correct procedure to be performed.    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 Much improved    Beck Depression Inventory Total Score:     Baseline: 51  Most recent: ***    Mini-Mental Status ExamTotal Score:     Baseline: 25  Most recent: 30***    MEDICAL INFORMATION:    Allergies: Haldol [haloperidol lactate], Cyclosporine, Tetracycline, and Tramadol    Medical History: See ECT Consult    Surgical History: See ECT Consult    VITAL SIGNS:      Pre-procedure:    There were no vitals filed for this visit.  BP: ***  HR: ***    Post-stimulus:     No data found.  BP: ***  HR: ***      THYMATRON:  ECT # Thymatron Settings (%) Modification Seizure   Length Cuff Duration  (sec) EEG Duration  (sec)   1 100 Good Adequate *** ***       Medications given during procedure:   Current Outpatient Medications   Medication Sig Dispense Refill    albuterol HFA 90 mcg/actuation inhaler Inhale 2 puffs every six (6) hours as needed for wheezing. 8 g 0    carboxymethylcellulose sodium (THERATEARS) 0.25 % Drop Administer 2 drops to both eyes four (4) times a day. 15 mL 0    hydrOXYzine (ATARAX) 25 MG tablet Take 1 tablet (25 mg total) by mouth daily as needed for anxiety. 30 tablet 2 semaglutide (OZEMPIC) 1 mg/dose (4 mg/3 mL) PnIj injection Inject 1 mg under the skin every seven (7) days. 9 mL 2    sertraline (ZOLOFT) 50 MG tablet Take 1 tablet (50 mg total) by mouth daily. 30 tablet 2    XIFAXAN 550 mg Tab Take 1 tablet (550 mg total) by mouth Two (2) times a day. 180 tablet 4     No current facility-administered medications for this visit.          Neck Collar Used:  Yes     PROGRESS NOTE:     Alexis Burns is a 56 y.o. female who presents for outpatient index ECT. Treatments started in the inpatient setting, and she was discharged home on 08/03/2021.     Mood is good overall. Feels she's improved a lot. Denies SI. Reports bad headache after last tx.  ***    Mental Status Exam:  Appearance:    Well nourished and Well developed  Behavior:  Calm and Cooperative   Motor:   No abnormal movements   Speech/Language:    Normal rate, volume, tone, fluency and Language intact, well formed   Mood:   ***   Affect:   Calm, Cooperative, Euthymic, and Mood congruent   Thought process:   Logical, linear, clear, coherent, goal directed   Thought content:     No SI   Perceptual disturbances:     Denies auditory and visual hallucinations, behavior not concerning for response to internal stimuli   Orientation:   Oriented to person, place, time, and general circumstances   Attention:   Able to fully attend without fluctuations in consciousness   Concentration:   Able to fully concentrate and attend     Physical Exam:   Gen: NAD  Pulm: No increased work of breathing  Neuro: No tremors, tics, or abnormal movements    Assessment and Plan:   Mood Disorder: Maj Depress D/O, 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   ***    Interventions Today:  ***  Medication adjustments:  none  Energy adjustments:  Increased energy to 100%  Used disposable bite block due to tongue injury during recent treatment.***    Post-Treatment Complications: None    Recommendations for Future Treatments:  ***  Medication adjustments: None  Energy adjustments:  none  Frequency: return in  1 week    Next Treatment Date: 08/29/2021     Meds to take prior to ECT: None    Special Instructions: Do not take these medications after 6pm on the night before ECT: Klonopin/clonazepam    Assisted by: Conchita Paris, MD    Matthew Folks, MD Hypertension treated with labetalol    Recommendations for Future Treatments:    Medication adjustments: None  Energy adjustments:  none  Frequency: return in  1 week    Next Treatment Date: 08/29/2021     Meds to take prior to ECT: None    Special Instructions: Do not take these medications after 6pm on the night before ECT: Klonopin/clonazepam    Assisted by: none    Matthew Folks, MD

## 2021-08-22 ENCOUNTER — Ambulatory Visit: Admit: 2021-08-22 | Discharge: 2021-08-23 | Payer: PRIVATE HEALTH INSURANCE

## 2021-08-22 ENCOUNTER — Encounter
Admit: 2021-08-22 | Discharge: 2021-08-23 | Payer: PRIVATE HEALTH INSURANCE | Attending: Certified Registered" | Primary: Certified Registered"

## 2021-08-22 MED ADMIN — labetaloL (NORMODYNE,TRANDATE) injection: INTRAVENOUS | @ 18:00:00 | Stop: 2021-08-22

## 2021-08-22 MED ADMIN — promethazine (PHENERGAN) injection: INTRAVENOUS | @ 17:00:00 | Stop: 2021-08-22

## 2021-08-22 MED ADMIN — caffeine citrate (CAFCIT) injection: INTRAVENOUS | @ 17:00:00 | Stop: 2021-08-22

## 2021-08-22 MED ADMIN — succinylcholine (ANECTINE) injection: INTRAVENOUS | @ 17:00:00 | Stop: 2021-08-22

## 2021-08-22 MED ADMIN — ondansetron (ZOFRAN) injection: INTRAVENOUS | @ 17:00:00 | Stop: 2021-08-22

## 2021-08-22 MED ADMIN — propofoL (DIPRIVAN) injection: INTRAVENOUS | @ 17:00:00 | Stop: 2021-08-22

## 2021-08-22 MED ADMIN — sodium chloride (NS) 0.9 % infusion: INTRAVENOUS | @ 17:00:00 | Stop: 2021-08-22

## 2021-08-22 MED ADMIN — ketorolac (TORADOL) injection: INTRAVENOUS | @ 17:00:00 | Stop: 2021-08-22

## 2021-08-24 ENCOUNTER — Telehealth: Admit: 2021-08-24 | Discharge: 2021-08-25 | Payer: PRIVATE HEALTH INSURANCE | Attending: Clinical | Primary: Clinical

## 2021-08-24 DIAGNOSIS — F419 Anxiety disorder, unspecified: Principal | ICD-10-CM

## 2021-08-24 DIAGNOSIS — F332 Major depressive disorder, recurrent severe without psychotic features: Principal | ICD-10-CM
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
## 2021-08-29 ENCOUNTER — Ambulatory Visit: Admit: 2021-08-29 | Discharge: 2021-08-30 | Payer: PRIVATE HEALTH INSURANCE

## 2021-08-29 ENCOUNTER — Encounter: Admit: 2021-08-29 | Discharge: 2021-08-30 | Payer: PRIVATE HEALTH INSURANCE

## 2021-08-29 MED ADMIN — promethazine (PHENERGAN) injection: INTRAVENOUS | @ 16:00:00 | Stop: 2021-08-29

## 2021-08-29 MED ADMIN — labetaloL (NORMODYNE,TRANDATE) injection: INTRAVENOUS | @ 16:00:00 | Stop: 2021-08-29

## 2021-08-29 MED ADMIN — propofoL (DIPRIVAN) injection: INTRAVENOUS | @ 16:00:00 | Stop: 2021-08-29

## 2021-08-29 MED ADMIN — succinylcholine (ANECTINE) injection: INTRAVENOUS | @ 16:00:00 | Stop: 2021-08-29

## 2021-08-29 MED ADMIN — acetaminophen (OFIRMEV) 10 mg/mL injection: INTRAVENOUS | @ 16:00:00 | Stop: 2021-08-29

## 2021-08-29 MED ADMIN — ketorolac (TORADOL) injection: INTRAVENOUS | @ 16:00:00 | Stop: 2021-08-29

## 2021-08-29 MED ADMIN — sodium chloride (NS) 0.9 % infusion: INTRAVENOUS | @ 16:00:00 | Stop: 2021-08-29

## 2021-08-29 NOTE — Unmapped (Signed)
[facility]               Procedure Note                                             Requesting Attending Physician: Not In System Provider  Admit Date: 08/29/2021     Service Type: Inpatient  Requesting Attending Physician: Not In System Provider  Service requesting consult: Psychiatry  Consulting service: Psychiatry  Admit Date: 08/29/2021  Service Date: August 29, 2021      Time out was taken with staff to confirm correct patient and correct procedure to be performed.    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 Much improved    Beck Depression Inventory Total Score:     Baseline: 51  Most recent: 15    Mini-Mental Status ExamTotal Score:     Baseline: 25  Most recent: 29    MEDICAL INFORMATION:    Allergies: Haldol [haloperidol lactate], Cyclosporine, Tetracycline, and Tramadol    Medical History: See ECT Consult    Surgical History: See ECT Consult    VITAL SIGNS:      Pre-procedure:    BP: 170/99  HR: 81    Post-stimulus:     BP: 129/81  HR: 87      THYMATRON:  ECT # Thymatron Settings (%) Modification Seizure   Length Cuff Duration  (sec) EEG Duration  (sec)   1 100 Good Adequate 21 29       Medications given during procedure:   Current Outpatient Medications   Medication Sig Dispense Refill   ??? albuterol HFA 90 mcg/actuation inhaler Inhale 2 puffs every six (6) hours as needed for wheezing. 8 g 0   ??? carboxymethylcellulose sodium (THERATEARS) 0.25 % Drop Administer 2 drops to both eyes four (4) times a day. 15 mL 0   ??? hydrOXYzine (ATARAX) 25 MG tablet Take 1 tablet (25 mg total) by mouth daily as needed for anxiety. 30 tablet 2   ??? semaglutide (OZEMPIC) 1 mg/dose (4 mg/3 mL) PnIj injection Inject 1 mg under the skin every seven (7) days. 9 mL 2   ??? sertraline (ZOLOFT) 50 MG tablet Take 1 tablet (50 mg total) by mouth daily. 30 tablet 2   ??? XIFAXAN 550 mg Tab Take 1 tablet (550 mg total) by mouth Two (2) times a day. 180 tablet 4     No current facility-administered medications for this encounter.          Neck Collar Used:  Yes     PROGRESS NOTE:     Alexis Burns is a 56 y.o. female who presents for outpatient index ECT. Treatments started in the inpatient setting, and she was discharged home on 08/03/2021.     She presents for her second continuation ECT at the one week interval.  Mood is not depressed.  Sleep and motivation are much improved. Enjoying camping with the family.  Energy unchanged.  Denies SI, HI, AVH.     She notes ongoing headaches. Reports tylenol is not helpful.  Ibuprofen has been for past headaches, patient says GI doctor told her she could have small doses of NSAIDs but to limit use due to liver disease.  Tolerated toradol 15 mg IV today.  Added po sumitriptan for post ECT headache today.    Mental Status Exam:  Appearance:    Well nourished and Well developed   Behavior:  Calm and Cooperative   Motor:   No abnormal movements   Speech/Language:    Normal rate, volume, tone, fluency and Language intact, well formed   Mood:   good   Affect:   Calm, Cooperative, Euthymic, and Mood congruent   Thought process:   Logical, linear, clear, coherent, goal directed   Thought content:     No SI   Perceptual disturbances:     Denies auditory and visual hallucinations, behavior not concerning for response to internal stimuli   Orientation:   Oriented to person, place, time, and general circumstances   Attention:   Able to fully attend without fluctuations in consciousness   Concentration:   Able to fully concentrate and attend     Physical Exam:   Gen: NAD  Pulm: No increased work of breathing  Neuro: No tremors, tics, or abnormal movements    Assessment and Plan:   Mood Disorder: Maj Depress D/O, 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 Patient bit her tongue post-ECT procedure as she was emerging from anesthesia.    Interventions Today:    ??? Medication adjustments: added Toradol 15 mg IV for headaches and po sumitryptan  ??? Energy adjustments:  Increased energy to 100%    Post-Treatment Complications: Hypertension treated with labetalol 5 mg      Recommendations for Future Treatments:    ??? Medication adjustments: None  ??? Energy adjustments:  none  ??? Frequency: return in  1 week    Next Treatment Date: 09/12/2021     Meds to take prior to ECT: None    Special Instructions: Do not take these medications after 6pm on the night before ECT: Klonopin/clonazepam    Assisted by: none    Willey Blade, MD

## 2021-09-05 MED ORDER — SERTRALINE 50 MG TABLET
ORAL_TABLET | Freq: Every day | ORAL | 2 refills | 30 days | Status: CP
Start: 2021-09-05 — End: 2021-12-04

## 2021-09-10 ENCOUNTER — Ambulatory Visit: Admit: 2021-09-10 | Discharge: 2021-09-11 | Payer: PRIVATE HEALTH INSURANCE

## 2021-09-10 ENCOUNTER — Ambulatory Visit
Admit: 2021-09-10 | Discharge: 2021-09-11 | Payer: PRIVATE HEALTH INSURANCE | Attending: Family Medicine | Primary: Family Medicine

## 2021-09-10 ENCOUNTER — Encounter
Admit: 2021-09-10 | Discharge: 2021-09-11 | Payer: PRIVATE HEALTH INSURANCE | Attending: Anesthesiology | Primary: Anesthesiology

## 2021-09-10 MED ORDER — CYCLOBENZAPRINE 5 MG TABLET
ORAL_TABLET | Freq: Every evening | ORAL | 0 refills | 30 days | Status: CP | PRN
Start: 2021-09-10 — End: ?

## 2021-09-10 MED ADMIN — promethazine (PHENERGAN) injection: INTRAVENOUS | @ 14:00:00 | Stop: 2021-09-10

## 2021-09-10 MED ADMIN — caffeine citrate (CAFCIT) injection: INTRAVENOUS | @ 14:00:00 | Stop: 2021-09-10

## 2021-09-10 MED ADMIN — ketorolac (TORADOL) injection: INTRAVENOUS | @ 14:00:00 | Stop: 2021-09-10

## 2021-09-10 MED ADMIN — sodium chloride (NS) 0.9 % infusion: INTRAVENOUS | @ 14:00:00 | Stop: 2021-09-10

## 2021-09-10 MED ADMIN — succinylcholine (ANECTINE) injection: INTRAVENOUS | @ 14:00:00 | Stop: 2021-09-10

## 2021-09-10 MED ADMIN — ondansetron (ZOFRAN) injection: INTRAVENOUS | @ 14:00:00 | Stop: 2021-09-10

## 2021-09-10 MED ADMIN — propofoL (DIPRIVAN) injection: INTRAVENOUS | @ 14:00:00 | Stop: 2021-09-10

## 2021-09-10 NOTE — Unmapped (Unsigned)
Assessment and Plan  Kialee Lorrayne Ismael is a 56 y.o. female with a PMH as above who presents to the clinic for the following:    Problem List Items Addressed This Visit    None      No follow-ups on file.    [facility]  09/10/2021    Chief Complaint  Chief Complaint   Patient presents with   ??? Jaw Pain     That includes the ear and neck on the left x 2 mos        HPI  Yaritzel Stange is a 56 y.o. female with a PMH as below who presents for the following:    Left jaw pain for 2 months which started before ECT.  Characterizes pain as episodic lasting 15-5minutes. Gets a few episodes per day.      Review of Systems   Comprehensive ROS negative except above    PHYSICAL EXAM  BP 105/73  - Pulse 81  - Temp 35.7 ??C (96.2 ??F) (Temporal)  - Ht 167.6 cm (5' 6)  - Wt (!) 107 kg (236 lb)  - SpO2 94%  - BMI 38.09 kg/m??   General appearance: alert, cooperative, NAD   Head: normocephalic and atraumatic  Eyes: normal EOMs, PERRL, normal conjunctiva, no discharge  ENT: OP clear and moist, no tonsillar exudates or erythema, normal TMs and external ear canals, normal nasal passages  Neck: normal ROM, supple, no thyromegaly, no cervical/supraclavicular lymphadenopathy, no appreciable JVD  Lungs: normal work of breathing, good air entry and clear to auscultation bilaterally, no wheezes or crackles appreciated   Heart: regular rate and rhythm, S1, S2 normal, no murmur, click, rub or gallop   Abdomen: Abdomen soft, non-tender, non distended. No masses, no organomegaly. Bowel sounds present.  Extremities: extremities normal, atraumatic, no cyanosis or edema  NEURO: AAOx3, appropriately responds to questions, spontaneous movement of extremities, strength and sensation intact bilaterally, no facial asymmetry or CN deficit  Psych: normal mood and affect    [facility] Components:   I have reviewed and addressed the patient???s adherence and response to prescribed medications. I have identified patient barriers to following the proposed medication and treatment plan, and have noted opportunities to optimize healthy behaviors. I have answered the patient???s questions to satisfaction and the patient voices understanding.

## 2021-09-10 NOTE — Unmapped (Signed)
Alexis Burns  tolerated ECT well.  0 PRN medications given after treatment. Ice pack provided for head/neck pain post procedure. Vital signs stable. Discharge instructions reviewed with patient.   Patient awaiting transportation.    Problem: Cognitive Impairment Post-Electroconvulsive Therapy (Neurostimulation Therapy)  Goal: Baseline Cognitive Function Maintained  Outcome: Resolved     Problem: Procedure Safety and Recovery (Neurostimulation Therapy)  Goal: Optimal Procedural Recovery (Neurostimulation Therapy)  Outcome: Resolved     Problem: Adult Inpatient Plan of Care  Goal: Plan of Care Review  Outcome: Resolved  Goal: Patient-Specific Goal (Individualized)  Outcome: Resolved  Flowsheets (Taken 09/10/2021 0930)  Patient-Specific Goals (Include Timeframe): to have a good day on 09/10/21  Goal: Absence of Hospital-Acquired Illness or Injury  Outcome: Resolved  Goal: Optimal Comfort and Wellbeing  Outcome: Resolved  Goal: Readiness for Transition of Care  Outcome: Resolved  Goal: Rounds/Family Conference  Outcome: Resolved

## 2021-09-10 NOTE — Unmapped (Signed)
[facility]               Procedure Note                                             Requesting Attending Physician: Not In System Provider  Admit Date: 09/10/2021     Service Type: Inpatient  Requesting Attending Physician: Not In System Provider  Service requesting consult: Psychiatry  Consulting service: Psychiatry  Admit Date: 09/10/2021  Service Date: September 10, 2021      Time out was taken with staff to confirm correct patient and correct procedure to be performed.    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 Much improved    Beck Depression Inventory Total Score:     Baseline: 51  Most recent: 15    Mini-Mental Status ExamTotal Score:     Baseline: 25  Most recent: 29    MEDICAL INFORMATION:    Allergies: Haldol [haloperidol lactate], Cyclosporine, Tetracycline, and Tramadol    Medical History: See ECT Consult    Surgical History: See ECT Consult    VITAL SIGNS:      Pre-procedure:  146/93  92      Post-stimulus:   147/106  87      THYMATRON:  ECT # Thymatron Settings (%) Modification Seizure   Length Cuff Duration  (sec) EEG Duration  (sec)   1 100 Good Adequate 23 43       Medications given during procedure:   Current Outpatient Medications   Medication Sig Dispense Refill    albuterol HFA 90 mcg/actuation inhaler Inhale 2 puffs every six (6) hours as needed for wheezing. 8 g 0    carboxymethylcellulose sodium (THERATEARS) 0.25 % Drop Administer 2 drops to both eyes four (4) times a day. 15 mL 0    hydrOXYzine (ATARAX) 25 MG tablet Take 1 tablet (25 mg total) by mouth daily as needed for anxiety. 30 tablet 2    semaglutide (OZEMPIC) 1 mg/dose (4 mg/3 mL) PnIj injection Inject 1 mg under the skin every seven (7) days. 9 mL 2    sertraline (ZOLOFT) 50 MG tablet Take 1 tablet (50 mg total) by mouth daily. 30 tablet 2    XIFAXAN 550 mg Tab Take 1 tablet (550 mg total) by mouth Two (2) times a day. 180 tablet 4     No current facility-administered medications for this encounter.          Neck Collar Used:  Yes     PROGRESS NOTE:     Alexis Burns is a 56 y.o. female who presents for outpatient index ECT. Treatments started in the inpatient setting, and she was discharged home on 08/03/2021.     States that things were going well after last ECT, felt like this interval was too long. She feels like one time per week at this point would be good. Describes her symptoms as racing thoughts, feeling easily angered, increased anxiety, feeling down in the dumps about life. Denies SI throughout this. States she has been diagnosed with  liver disease, which is making her life difficult. Because of this, she is not supposed to drive, cannot take ibuprofen or tylenol, has to take a laxative which makes her have to use the bathroom a lot, and is easily confused. This morning, reports a pain in her ear and jaw, she is seeing her PCP this afternoon. Usually has some memory trouble and HA after her treatments.     Mental Status Exam:  Appearance:    Well nourished and Well developed   Behavior:  Calm and Cooperative   Motor:   No abnormal movements   Speech/Language:    Normal rate, volume, tone, fluency and Language intact, well formed   Mood:   Down    Affect:   Calm, Cooperative, Euthymic, and Mood congruent   Thought process:   Logical, linear, clear, coherent, goal directed   Thought content:     No SI   Perceptual disturbances:     Denies auditory and visual hallucinations, behavior not concerning for response to internal stimuli   Orientation:   Oriented to person, place, time, and general circumstances   Attention:   Able to fully attend without fluctuations in consciousness   Concentration:   Able to fully concentrate and attend     Physical Exam:   Gen: NAD  Pulm: No increased work of breathing  Neuro: No tremors, tics, or abnormal movements    Assessment and Plan:   Mood Disorder: Maj Depress D/O, 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 We return to weekly.     Seizure was of adequate length, good amplitude, good morphology, and fair post-ictal suppression.       Interventions Today:    Medication adjustments: none  Energy adjustments: none  Post-Treatment Complications:  none        Recommendations for Future Treatments:    Medication adjustments: None  Energy adjustments:  none  Frequency: return in  1 week    Next Treatment Date: 09/17/21    Meds to take prior to ECT: None    Special Instructions: Do not take these medications after 6pm on the night before ECT: Klonopin/clonazepam    Assisted by: none    Gabriel Cirri, MD

## 2021-09-11 ENCOUNTER — Telehealth: Admit: 2021-09-11 | Discharge: 2021-09-12 | Payer: PRIVATE HEALTH INSURANCE

## 2021-09-11 DIAGNOSIS — F959 Tic disorder, unspecified: Principal | ICD-10-CM

## 2021-09-11 DIAGNOSIS — F324 Major depressive disorder, single episode, in partial remission: Principal | ICD-10-CM

## 2021-09-11 DIAGNOSIS — F411 Generalized anxiety disorder: Principal | ICD-10-CM

## 2021-09-11 MED ORDER — SERTRALINE 50 MG TABLET
ORAL_TABLET | Freq: Every day | ORAL | 2 refills | 30 days | Status: CP
Start: 2021-09-11 — End: 2021-12-10

## 2021-09-11 MED ORDER — CLONIDINE HCL 0.1 MG TABLET
ORAL_TABLET | 0 refills | 0 days | Status: CP
Start: 2021-09-11 — End: ?

## 2021-09-11 MED ORDER — HYDROXYZINE HCL 25 MG TABLET
ORAL_TABLET | Freq: Every day | ORAL | 2 refills | 30 days | Status: CP | PRN
Start: 2021-09-11 — End: ?

## 2021-09-11 NOTE — Unmapped (Addendum)
Thank-you for visiting the [facility] clinic.     The following changes were made to your medications:  - Start clonidine for tics after having taken Flexeril for more than 1 week. Start with 1/2 tablet nightly for 6 days then can increase to 1/2 tablet twice daily as tolerated. Monitor for sedation and lightheadedness.  - No other changes were made to your medication regimen.    Please discuss with your primary care physician, however, we see no reason you cannot take ibuprofen (400-800 mg with water and food) to treat your headache.     Your next follow up appointment will be scheduled for 11/06/2021 at 10am.     Follow-up instructions:  -- Please continue taking your medications as prescribed for your mental health.   -- Do not make changes to your medications, including taking more or less than prescribed, unless under the supervision of your physician. Be aware that some medications may make you feel worse if abruptly stopped  -- Please refrain from using illicit substances, as these can affect your mood and could cause anxiety or other concerning symptoms.   -- Seek further medical care for any increase in symptoms or new symptoms such as thoughts of wanting to hurt yourself or hurt others.     Contact info:  Life-threatening emergencies: Call 911 or go to the nearest ER for medical or psychiatric attention.     Issues that need urgent attention but are not life threatening: Call the clinic outpatient front desk for assistance. [phone] for our [facility] clinic located at [address] [facility]. [phone] for our [facility] STEP clinic.    Non-urgent routine concerns and questions: Send a message through [facility] or call our clinic front desk.    Refill requests: Check with your pharmacy to initiate refill requests.    Regarding appointments:  - If you need to cancel your appointment, we ask that you call your clinic at least 24 hours before your scheduled appointment. [phone] for our [facility] clinic located at [address] [facility]. [phone] for our [facility] STEP clinic.  - If for any reason you arrive 15 minutes later than your scheduled appointment time, you may not be seen and your visit may be rescheduled.  - Please remember that we will not automatically reschedule missed appointments.  - If you miss two (2) appointments without letting us know in advance, you will likely be referred to a provider in your community.  - We will do our best to be on time. Sometimes an emergency will arise that might cause your clinician to be late. We will try to inform you of this when you check in for your appointment. If you wait more than 15 minutes past your appointment time without such notice, please speak with the front desk staff.    In the event of bad weather, the clinic staff will attempt to contact you, should your appointment need to be rescheduled. Additionally, you can call the Patient Weather Line [phone] for system-wide clinic status    For more information and reminders regarding clinic policies (these were provided when you were admitted to the clinic), please ask the front desk.

## 2021-09-11 NOTE — Unmapped (Signed)
[facility]  Psychiatry   Established Patient E&M Service - Outpatient       Assessment:  Alexis Burns is a 56 y.o. female with a history of depression, hypertension, and anxiety, who presents for follow-up evaluation.     Today, the patient presents with stable mood. She reports significant benefit from current ECT treatments, specifically with depressive symptoms and feeling suspicious of others. She does endorse a headache as a frequent side effect of these treatments, and is planning to start Flexeril that has been prescribed by her PCP. No acute safety concerns. Her psychotherapy sessions have helped her navigate challenging relationships with her family members. 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 Will initiate clonidine to manage this symptom. No further medicaiton changes. The patient may benefit from a future increase in Zoloft if her depressive symptoms return/worsen. Plan for follow up 4 weeks. Alexis Burns verbalized understanding and is agreeable with plan.     Identifying Information: Alexis Burns is an ADTC patient since 2019 with a history of depression and anxiety, with past hospitalization in 2017 at [facility]. Past medication trials include Prozac (stopped working), risperidone (brief trial), lithium (intolerable side effects), buspirone (unhelpful at 20mg  TID), duloxetine (90mg  well-tolerated but not helpful on its own), aripiprazole (as adjunct for duloxetine, eventually stopped due to tremors), Wellbutrin (anxiety, dry mouth), Seroquel (ineffective), Cytomel (dry mouth, joint pain). Over several years until late 2020, she was fairly stable on duloxetine 90mg  and Abilify 5mg . She used to work in a [employer], [employer] pre-pandemic in a stressful role, took time off for a leg injury, and then when she went back to work she was fired. This was destabilizing, and it was closely followed by tibial tendon surgery in and then suspected COVID infection. By Feb 2021, mood remained unstable, but Abilify was causing significant shakiness even at tapered doses, and she discontinued it. She had an exacerbation of depression in May 2021 with her son's mental health as a major stressor, and after intolerance of increasing Cymbalta to 120mg , ineffectiveness of Seroquel, intolerance of Wellbutrin, and time-limited benefit of risperidone, in September 2021 she was admitted to the [facility] for suicidal ideation with a plan to shoot herself. They started Cytomel but she did not tolerate it. She noticed some abnormal tongue movements while on Abilify and these worsened after Abilify discontinuation. We considered the possibility of TD, but neurology evaluation labeled the tongue movements as tic-like, and they also diagnosed her with essential tremor. She started to show signs of liver dysfunction on her bloodwork, and in December 2021 we made the switch from duloxetine to Prozac. This was tolerated well and effective for mood, and mood has varied with family stressors. Both insomnia and hypersomnia have been problems for Alexis Burns, and prns have been very helpful, by spring 2022 sleep had improved with more physical activity. Alexis Burns was eventually restarted on Klonopin in Spring 2023 given severe anxiety/panic that caused her to be unable to leave her house for several weeks. Additionally, she experienced recurrence of depressive symptoms and passive suicidal ideation. Given how well she had done on an SNRI in the past, decision was made to cross taper Prozac to Pristiq. Given continued depressed mood, Alexis Burns was subsequently referred to interventional psychiatry ECT clinic in April 2023. Due to severity of symptoms, Alexis Burns was directly admitted to [facility] Crisis to start inpatient ECT and she was transitioned  from Pristiq to sertraline. Klonopin was discontinued and Hydroxyzine PRN was started to target anxiety. She continued ECT as an outpatient.     Risk Assessment:  A suicide and violence risk assessment was performed at our last visit, and safety assessment remains uncharged: she is not at acute risk of harm to herself but is at chronic risk, and we will continue assessing safety at future evaluations.  Stressors: death of father in September 13, 2012 and mother in 14-Sep-2015, chronic depressive symptoms, financial stressors, unemployment, global pandemic and likely COVID19 infection, stressful relationships by son (improving).     Plan:  Problem #1: MDD:  Status: Chronic  Interventions:  - Continue sertraline 50 mg daily   - Patient receiving weekly ECT with [place]  - Continue weekly therapy with Alexis Burns    Problem #2: GAD:    Status of problem: chronic with acute exacerbation  - Continue hydroxyzine 25 mg daily as needed for anxiety     Problem #3: Insomnia and excessive daytime sleepiness  Status of problem:  improved or improving with exercise  Interventions:   - Patient previously reported benefit in mood/fatigue from Vyvanse, but have discussed that given lack of childhood ADHD diagnosis and without sleep study we are not inclined to re-initiate this medication    Problem #4: Abnormal tongue movements  Status of problem: chronic  Interventions:  - Neurology labeled her tongue movements as tic-related, and suggested therapies to help. We had considered if this was a symptoms of tardive dyskinesia as it worsened after Abilify discontinuation which can happen. It originally diminished with stopping artifical sweeteners, but then came back. Neurology's assessment is reassuring  - Initiate clonidine 0.05 mg nightly for 6 days, then can increase to 0.05 mg BID as tolerated. Patient is to start this medication after having taken Flexeril for more than 1 week. Monitor for sedation and lightheadedness. May benefit from transitioning to guanfacine if experiences excessive sedation.     Problem: #5: Headache following ECT treatment  Status of problem:  new problem to this provider  Interventions:  - Continue Flexeril 5 mg nightly PRN (prescribed by PCP)  - Consider ibuprofen 400-800 mg PRN (advised patient to discuss this with PCP)    Psychotherapy:  No billable psychotherapy service provided.    Patient has been given this writer's contact information as well as the [facility] Psychiatry urgent line number. The patient has been instructed to call 911 for emergencies.    Patient was seen and plan of care was discussed with the Attending MD, Dr. Gerrit Friends, who agrees with the above statement and plan.    Jiles Crocker, MD      Subjective:     Psychiatric Chief Concern:  Follow-up psychiatric evaluation for mood    Interval History:  Patient reports she feels mentally okay right now. Endorses a headache from her ECT treatment yesterday and says they usually last for 3 days after the treatment. Says she has reportedly been told she couldn't take ibuprofen or Tylenol due to liver issues. Was just prescribed Flexeril muscle relaxer for headache/jaw pain and is waiting to pick it up from [facility]. Says ECT saved my life as she reports she was ready to end her life prior to these treatments. Says she is less emotional and less suspicious of others since starting ECT. Her ECT treatments had been decreased to every other week, but she requested last week that they continue weekly out of concern that her anxiety was worsening. Reports her anxiety has otherwise  been good and says hydroxyzine is helpful. Complains of chronic tongue tic for several years and is interested in starting medication to manage it. Has previously tried lidocaine rinses and tongue mouth guards with little benefit. Denies SI/HI or AVH. Reports her therapy sessions with Alexis Burns have been going well and has helped her navigate difficult situations with her family members.    Objective:    Mental Status Exam:  Appearance:    Appears stated age, Well nourished and Clean/Neat, sitting in chair in front of screen   Motor:   No abnormal movements   Speech/Language:    Normal rate, volume, tone, fluency and Language intact, well formed   Mood:   good   Affect:   Full, mood congruent   Thought process and Associations:   Logical, linear, clear, coherent, goal directed   Abnormal/psychotic thought content:     Denies SI/HI   Perceptual disturbances:     Denies auditory and visual hallucinations, behavior not concerning for response to internal stimuli     Other:   -         The patient reports they are currently: at home. I spent 45 minutes on the real-time audio and video with the patient on the date of service. I spent an additional 45 minutes on pre- and post-visit activities on the date of service.     The patient was physically located in [place] or a state in which I am permitted to provide care. The patient and/or parent/guardian understood that s/he may incur co-pays and cost sharing, and agreed to the telemedicine visit. The visit was reasonable and appropriate under the circumstances given the patient's presentation at the time.    The patient and/or parent/guardian has been advised of the potential risks and limitations of this mode of treatment (including, but not limited to, the absence of in-person examination) and has agreed to be treated using telemedicine. The patient's/patient's family's questions regarding telemedicine have been answered.     If the visit was completed in an ambulatory setting, the patient and/or parent/guardian has also been advised to contact their provider???s office for worsening conditions, and seek emergency medical treatment and/or call 911 if the patient deems either necessary.

## 2021-09-12 NOTE — Unmapped (Signed)
Likely TMJ and rec conservative measures with heat/ice and provided additional home care instructions. Possible contribution from trapezius spasm. Rec massage and stretching as well as trial of flexeril. Unable to take OTC analgesics given cirrhosis.

## 2021-09-12 NOTE — Unmapped (Signed)
This was a telehealth service where a resident was involved. I spent 15 minutes seeing and evaluating the patient via real-time audio/video connection, participating in the key portions of the service.  I spent 10 minutes reviewing the patient's medical record and discussing the evaluation and treatment recommendations with the resident.  I reviewed and edited the resident's note.  I agree with the resident's findings and plan.    Dory Peru, MD

## 2021-09-14 NOTE — Unmapped (Signed)
[facility] Refill Coordination Note    Specialty Medication(s) to be Shipped:   Infectious Disease: Xifaxan    Other medication(s) to be shipped: No additional medications requested for fill at this time     Eloni Dooms, DOB: 03-02-1966  Phone: [phone] (home)       All above HIPAA information was verified with patient.     Was a Nurse, learning disability used for this call? No    Completed refill call assessment today to schedule patient's medication shipment from the [facility] [phone]).  All relevant notes have been reviewed.     Specialty medication(s) and dose(s) confirmed: Regimen is correct and unchanged.   Changes to medications: Gabrial reports no changes at this time.  Changes to insurance: No  New side effects reported not previously addressed with a pharmacist or physician: None reported  Questions for the pharmacist: No    Confirmed patient received a Conservation officer, historic buildings and a Surveyor, mining with first shipment. The patient will receive a drug information handout for each medication shipped and additional FDA Medication Guides as required.       DISEASE/MEDICATION-SPECIFIC INFORMATION        N/A    SPECIALTY MEDICATION ADHERENCE     Medication Adherence    Patient reported X missed doses in the last month: 0  Specialty Medication: xifaxan 550mg   Patient is on additional specialty medications: No              Were doses missed due to medication being on hold? No    Xifaxan 550 mg: 10 days of medicine on hand        REFERRAL TO PHARMACIST     Referral to the pharmacist: Not needed      [facility]     Shipping address confirmed in Epic.     Delivery Scheduled: Yes, Expected medication delivery date: 09/19/21.     Medication will be delivered via Next Day Courier to the prescription address in Epic [name].    Willette Pa   [facility] Specialty Technician

## 2021-09-17 NOTE — Unmapped (Signed)
Maryana,  The only time I had was 9 am this Thursday 7/20. I've scheduled you then, let me know if that won't work.  Thank you,  Loraine Leriche

## 2021-09-19 MED FILL — XIFAXAN 550 MG TABLET: ORAL | 30 days supply | Qty: 60 | Fill #2

## 2021-09-20 ENCOUNTER — Telehealth: Admit: 2021-09-20 | Discharge: 2021-09-21 | Payer: PRIVATE HEALTH INSURANCE | Attending: Clinical | Primary: Clinical

## 2021-09-20 DIAGNOSIS — F419 Anxiety disorder, unspecified: Principal | ICD-10-CM

## 2021-09-20 DIAGNOSIS — F332 Major depressive disorder, recurrent severe without psychotic features: Principal | ICD-10-CM

## 2021-09-20 NOTE — Unmapped (Signed)
I spent 58 minutes on video with the patient. I spent an additional 0 minutes on pre- and post-visit activities.  The patient was physically located in [place] or a state in which I am permitted to provide care. The patient understood that s/he may incur co-pays and cost sharing and agreed to the telemedicine visit. The visit was completed via phone and/or video, which was appropriate and reasonable under the circumstances given the patient's presentation at the time.     The patient has been advised of the potential risks and limitations of this mode of treatment (including, but not limited to, the absence of in-person examination) and has agreed to be treated using telemedicine. The patient's/patient's family's questions regarding telemedicine have been answered.      If the phone/video visit was completed in an ambulatory setting, the patient has also been advised to contact their provider???s office for worsening conditions and seek emergency medical treatment and/or call 911 if the patient deems necessary.        [facility]  STEP Patient Psychotherapy    Name: Alexis Burns  Date: 09/20/21  MRN: [id number]  DOB: 1965/11/26  PCP: Jeannine Boga, FNP    Service Duration:  58 minutes        Service: Outpatient Therapy- Individual   [x]  Video     Date of Last Encounter:  Visit date not found    Mental Status/Behavioral Observations  Affect:  Mood congruent   Mood:   euthymic   Thought Process:  Goal directed and Linear   Behavior:   Cooperative and Direct eye contact   Self Harm: future oriented     Purpose of contact:    [x]   Continue to address treatment goals  []   Treatment Planning/Treatment progress review []  Discharge Planning     Interventions Provided:     [x]   CBT  []   Interpersonal Process Therapy []  Acceptance & Commitment Therapy (ACT)  []  DBT  []  Motivational Interviewing  []  Behavioral Activation                               []  Psycho-Education  []  Exposure Therapy  []  Trauma-Informed CBT [x]  Person Centered  [x]  Supportive Therapy    Patient Response/Progress:  Identifying Information: Ms. Foerst is an ADTC patient since 2019 with a history of depression and anxiety, with past hospitalization in 2017 at [facility]. Over several years until late 2020, she was fairly stable on duloxetine 90mg  and Abilify 5mg . She used to work in a Engineer, [employer] pre-pandemic in a stressful role, took time off for a leg injury, and then when she went back to work she was fired. This was destabilizing, and it was closely followed by tibial tendon surgery in and then suspected COVID infection. By Feb 2021, mood remained unstable. She had an exacerbation of depression in May 2021 with her son's mental health as a major stressor, and after intolerance of increasing Cymbalta to 120mg , ineffectiveness of Seroquel, intolerance of Wellbutrin, and time-limited benefit of risperidone.In 2021 she was admitted to the [facility] for suicidal ideation with a plan to shoot herself. Medication was tolerated well and effective for mood, and mood has varied with family stressors. Both insomnia and hypersomnia have been problems for Ms. Eckerman, and prns have been variably helpful, by spring 2022 sleep had improved with more physical activity.  01/10/21 POC Target Outcomes:  reduction in symptoms, including I can't turn my brain off, depression, anxiety, and aggravation/anger, increased independent functioning, specifically not being the world's worst wife resulting in being lazy and dependent on her husband, reduction in aggresive ideation, employment, improved community integration, including avoiding isolation and stop focusing on the negative.and stop taking things personally.  Treatment/Process:   Patient joins today by video for individual therapy. This session was conducted as a clinical therapy visit to assess acute safety, monitor ongoing clinical treatment plan, and provide continuity to outpatient treatment in the setting of Covid19 Pandemic.  Patient appropriately engaged in the discussion and was able to follow the course of the conversation; presenting euthymic balanced, without indication of psychosis, cooperatively, and future oriented.   Clinician facilitated discussion of pt status, concerns, and target outcomes with session content including:  Pt reports having just called to put down one of her dogs.  Pt reports feeing more balanced not manic not depressed noting I've been manic five times in my life with pt describing situations high elation (alcohol makes it worse and/or hypomania.  Pt reports that she has ECT tomorrow noting my 6th week  its great except I grind my teeth resulting in jaw pain impacting my sleeping due to awkward sleeping position.  Pt's husband told pt that the sister had $#!% on you but that pt didn't remember some of the past.  Pt reflects on situations that she can't remember noting ECT relationship including having headaches for about 4-5 days after ECT.  Pt reports that middle sister has been making contact with me I'm polite but pt is distant because I got burned before.  Pt reports having spoken to [place] who now calls or texts almost every day I'm fine, I just worry about him. [name] has moved to [place] and has a job, a girlfreind, and a place to live.  Pt reports getting back to work and having started to develop a friendship with a neighbor.  Pt reports that she and her husband are better than we've ever been.  Pt reports that her son [name] has stood by me.  Clinician processed session content with pt needing to keep up with the ECT and medication.    08/24/21  Pt reports on care since last session noting that she doesn't remember much of it noting a camping trip that I have no memory of noting it is a side effect of the treatment I had.  Pt reports that if she had to chose between no memories and the the way I was she would chose no memories. Pt reports I get glitches of memory.  Pt reports on having called her sisters not remembering the estrangement, pt remembers something in their voice that they were shocked. Pt's husband reminded her of the conflict( they walked away at my lowest point, I'll never forget that) and pt hasn't spoken with the sisters again.  Pt reflects on her dogs noting we lost one before I went in the hospital I was so bad emotionally it didn't even bother me noitng but we still have 5.    07/08/21   Pt reports on confusion about connection between surgery and brain fog noting having seen a liver doctor who reported that the brain fog dissiness confusion irritability the depression all of it is from the liver. Pt reports that I'm a little-pissed offed noting I'm scared to think anything else because what if it isn't the liver issue.    07/02/21  Pt reports that her  and her husband went to their preacher and told him everything noting one of her sisters goes to the [employer] also. Pt reports that the preacher recognized that the parents put on a front. Pt felt affirmed noting the significance of it being a religious person. Pt reports asking for biblically advice on how to interact with her family. Pt reports bond and determined to not respond in a negative way. Pt reports having tried but it didn't last long.  Pt reports that I don't believe in luck noting her belief that God has a plan for all of [place] but it is frustrating not knowing what is the plan. Pt reports I've had depression going on for 40 years and it has destroyed my life noting everytime I try to get close to my sisters my depression gets set off sharing that's why she chooses her dogs.  Pt reports my memory is for $#!^.    RISK ASSESSMENT: A suicide and violence risk assessment was performed as part of this evaluation. While future psychiatric events cannot be accurately predicted, the patient does not currently require acute inpatient psychiatric care and does not currently meet [facility] involuntary commitment criteria.      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

## 2021-09-21 ENCOUNTER — Encounter
Admit: 2021-09-21 | Discharge: 2021-09-22 | Payer: PRIVATE HEALTH INSURANCE | Attending: Certified Registered" | Primary: Certified Registered"

## 2021-09-21 ENCOUNTER — Ambulatory Visit: Admit: 2021-09-21 | Discharge: 2021-09-22 | Payer: PRIVATE HEALTH INSURANCE

## 2021-09-21 MED ADMIN — ondansetron (ZOFRAN) injection: INTRAVENOUS | @ 16:00:00 | Stop: 2021-09-21

## 2021-09-21 MED ADMIN — promethazine (PHENERGAN) injection: INTRAVENOUS | @ 17:00:00 | Stop: 2021-09-21

## 2021-09-21 MED ADMIN — sodium chloride (NS) 0.9 % infusion: INTRAVENOUS | @ 16:00:00 | Stop: 2021-09-21

## 2021-09-21 MED ADMIN — propofoL (DIPRIVAN) injection: INTRAVENOUS | @ 16:00:00 | Stop: 2021-09-21

## 2021-09-21 MED ADMIN — caffeine citrate (CAFCIT) injection: INTRAVENOUS | @ 16:00:00 | Stop: 2021-09-21

## 2021-09-21 MED ADMIN — succinylcholine (ANECTINE) injection: INTRAVENOUS | @ 16:00:00 | Stop: 2021-09-21

## 2021-09-21 MED ADMIN — labetaloL (NORMODYNE,TRANDATE) injection: INTRAVENOUS | @ 17:00:00 | Stop: 2021-09-21

## 2021-09-21 MED ADMIN — ketorolac (TORADOL) injection: INTRAVENOUS | @ 17:00:00 | Stop: 2021-09-21

## 2021-09-21 NOTE — Unmapped (Signed)
[facility] E[facility]               Procedure Note                                             Requesting Attending Physician: Nunzio Cory, MD  Admit Date: 09/21/2021     Service Type: Inpatient  Requesting Attending Physician: Nunzio Cory, MD  Service requesting consult: Psychiatry  Consulting service: Psychiatry  Admit Date: 09/21/2021  Service Date: September 21, 2021      Time out was taken with staff to confirm correct patient and correct procedure to be performed.    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 Much improved    Beck Depression Inventory Total Score:     Baseline: 51  Most recent: 15    Mini-Mental Status ExamTotal Score:     Baseline: 25  Most recent: 29    MEDICAL INFORMATION:    Allergies: Haldol [haloperidol lactate], Tylenol [acetaminophen], Cyclosporine, Tetracycline, and Tramadol    Medical History: See ECT Consult    Surgical History: See ECT Consult    VITAL SIGNS:      Pre-procedure:  161/99    Post-stimulus:   143/96  92      THYMATRON:  ECT # Thymatron Settings (%) Modification Seizure   Length Cuff Duration  (sec) EEG Duration  (sec)   1 100 Good Marginal 19 20       Medications given during procedure:   Current Outpatient Medications   Medication Sig Dispense Refill    cloNIDine HCL (CATAPRES) 0.1 MG tablet Start 1/2 tablet nightly for 6 days then can increase to 1/2 tablet twice daily as tolerated. Monitor for sedation and lightheadedness. Please wait to take this medication until after having taken Flexeril for more than 1 week. 60 tablet 0    cyclobenzaprine (FLEXERIL) 5 MG tablet Take 1 tablet (5 mg total) by mouth nightly as needed for muscle spasms. 30 tablet 0 hydrOXYzine (ATARAX) 25 MG tablet Take 1 tablet (25 mg total) by mouth daily as needed for anxiety. 30 tablet 2    semaglutide (OZEMPIC) 1 mg/dose (4 mg/3 mL) PnIj injection Inject 1 mg under the skin every seven (7) days. 9 mL 2    sertraline (ZOLOFT) 50 MG tablet Take 1 tablet (50 mg total) by mouth daily. 30 tablet 2    XIFAXAN 550 mg Tab Take 1 tablet (550 mg total) by mouth Two (2) times a day. 180 tablet 4     No current facility-administered medications for this encounter.          Neck Collar Used:  Yes     PROGRESS NOTE:     Alexis Burns is a 56 y.o. female who presents for outpatient index ECT. Treatments started in the inpatient setting, and she was discharged home on 08/03/2021.     [place]: Pt is tearful because her dog died yesterday at 5pm. She reports she missed her treatment last week because of covid and she can tell she missed it because she was  starting to get down in the dumps again. Reports she was waking up periodically throughout the night but she would go back to sleep. Says she doesn't have an appetite due to her ozempic. She states she doesn't have fun anymore because of her dog dying. Denies any SI but says she feels it coming. Pt reports she gets a 3 day migraine after treatment but she has been told to not take tylenol and ibuprofen due to her liver being bad. Reports nausea and dizziness after treatment. States she is currently nauseous. Pt denies any dissociative symptoms or AVH. Reports she was recently put on a new blood pressure medication. Says she might grind her teeth when she sleeps.     Mental Status Exam:  Appearance:    Well nourished and Well developed   Behavior:  Calm and Cooperative   Motor:   No abnormal movements   Speech/Language:    Normal rate, volume, tone, fluency and Language intact, well formed   Mood:   Grieving   Affect:   Calm, Cooperative, and Mood congruent   Thought process:   Logical, linear, clear, coherent, goal directed   Thought content:     No SI   Perceptual disturbances:     Denies auditory and visual hallucinations, behavior not concerning for response to internal stimuli   Orientation:   Oriented to person, place, time, and general circumstances   Attention:   Able to fully attend without fluctuations in consciousness   Concentration:   Able to fully concentrate and attend     Physical Exam:   Gen: NAD  Pulm: No increased work of breathing  Neuro: No tremors, tics, or abnormal movements    Assessment and Plan:   Mood Disorder: Maj Depress D/O, 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 We return to weekly.     Seizure was of marginal length, good amplitude, good morphology, and fair post-ictal suppression.       Interventions Today:    Medication adjustments: 15mg  labetalol   Energy adjustments: none  Post-Treatment Complications:  none        Recommendations for Future Treatments:    Medication adjustments: None  Energy adjustments:  none  Frequency: return in  1 week    Next Treatment Date: 09/28/21    Meds to take prior to ECT: None    Special Instructions: Do not take these medications after 6pm on the night before ECT: Klonopin/clonazepam    Assisted by: none    Bess Harvest, MD

## 2021-09-25 MED ORDER — SERTRALINE 100 MG TABLET
ORAL_TABLET | Freq: Every day | ORAL | 1 refills | 30 days | Status: CP
Start: 2021-09-25 — End: 2021-11-24

## 2021-09-25 NOTE — Unmapped (Signed)
Received mychart message from patient requesting a call for depression.     On interview, patient describes being very down, emotional, cried all day yesterday in the setting of recent death of her dog. She [place] her [place] out [place] this past weekend, but this did not help. She feels like she's ready to give up and is thinking about not taking medication for her liver disease (which would eventually kill her, she says). Denies acute safety concerns, no other plans to harm herself. Patient says, I'm not going to do anything. Denies access to guns.     Discussed that if she starts to feel unsafe with SI, she can reach out to husband, therapist [name], psychiatrists. If she becomes an imminent risk to herself, discussed to present to the ED for stabilization and safety. 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     To target depression, will plan to increase zoloft to 100mg  daily (first increase to 75mg  for 6-10 days and then increase to 100mg ). Patient is also interested in increasing the frequency of ECT, which I advised her to discuss with the ECT team.

## 2021-09-28 ENCOUNTER — Encounter
Admit: 2021-09-28 | Discharge: 2021-09-29 | Payer: PRIVATE HEALTH INSURANCE | Attending: Anesthesiology | Primary: Anesthesiology

## 2021-09-28 ENCOUNTER — Ambulatory Visit: Admit: 2021-09-28 | Discharge: 2021-09-29 | Payer: PRIVATE HEALTH INSURANCE

## 2021-09-28 MED ADMIN — sodium chloride (NS) 0.9 % infusion: INTRAVENOUS | @ 17:00:00 | Stop: 2021-09-28

## 2021-09-28 MED ADMIN — propofoL (DIPRIVAN) injection: INTRAVENOUS | @ 17:00:00 | Stop: 2021-09-28

## 2021-09-28 MED ADMIN — labetaloL (NORMODYNE,TRANDATE) injection: INTRAVENOUS | @ 17:00:00 | Stop: 2021-09-28

## 2021-09-28 MED ADMIN — ketorolac (TORADOL) injection: INTRAVENOUS | @ 17:00:00 | Stop: 2021-09-28

## 2021-09-28 MED ADMIN — promethazine (PHENERGAN) injection: INTRAVENOUS | @ 17:00:00 | Stop: 2021-09-28

## 2021-09-28 MED ADMIN — caffeine citrate (CAFCIT) injection: INTRAVENOUS | @ 17:00:00 | Stop: 2021-09-28

## 2021-09-28 MED ADMIN — succinylcholine (ANECTINE) injection: INTRAVENOUS | @ 17:00:00 | Stop: 2021-09-28

## 2021-09-28 NOTE — Unmapped (Signed)
[facility]               Procedure Note                                             Requesting Attending Physician: Unknown Per Patient Refe*  Admit Date: 09/28/2021     Service Type: Inpatient  Requesting Attending Physician: Unknown Per Patient Refe*  Service requesting consult: Psychiatry  Consulting service: Psychiatry  Admit Date: 09/28/2021  Service Date: September 28, 2021      Time out was taken with staff to confirm correct patient and correct procedure to be performed.    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 Much improved    Beck Depression Inventory Total Score:     Baseline: 51  Most recent: 34    Mini-Mental Status ExamTotal Score:     Baseline: 25  Most recent: 30    MEDICAL INFORMATION:    Allergies: Haldol [haloperidol lactate], Tylenol [acetaminophen], Cyclosporine, Tetracycline, and Tramadol    Medical History: See ECT Consult    Surgical History: See ECT Consult    VITAL SIGNS:      Pre-procedure:  160/114    Post-stimulus:   136/87  81      THYMATRON:  ECT # Thymatron Settings (%) Modification Seizure   Length Cuff Duration  (sec) EEG Duration  (sec)   1 100 Good Marginal 16 25       Medications given during procedure:   Current Outpatient Medications   Medication Sig Dispense Refill   ??? cloNIDine HCL (CATAPRES) 0.1 MG tablet Start 1/2 tablet nightly for 6 days then can increase to 1/2 tablet twice daily as tolerated. Monitor for sedation and lightheadedness. Please wait to take this medication until after having taken Flexeril for more than 1 week. 60 tablet 0   ??? cyclobenzaprine (FLEXERIL) 5 MG tablet Take 1 tablet (5 mg total) by mouth nightly as needed for muscle spasms. 30 tablet 0   ??? hydrOXYzine (ATARAX) 25 MG tablet Take 1 tablet (25 mg total) by mouth daily as needed for anxiety. 30 tablet 2   ??? semaglutide (OZEMPIC) 1 mg/dose (4 mg/3 mL) PnIj injection Inject 1 mg under the skin every seven (7) days. 9 mL 2   ??? sertraline (ZOLOFT) 100 MG tablet Take 1 tablet (100 mg total) by mouth daily. 30 tablet 1   ??? XIFAXAN 550 mg Tab Take 1 tablet (550 mg total) by mouth Two (2) times a day. 180 tablet 4     No current facility-administered medications for this encounter.          Neck Collar Used:  Yes     PROGRESS NOTE:     Alexis Burns is a 56 y.o. female who presents for outpatient index ECT. Treatments started in the inpatient setting, and she was discharged home on 08/03/2021.     Life sucks right now. Dealing with grief of dog passing and recent ammonia levels     Mental Status Exam:  Appearance:    Well nourished and  Well developed   Behavior:  Calm and Cooperative   Motor:   No abnormal movements   Speech/Language:    Normal rate, volume, tone, fluency and Language intact, well formed   Mood:   frustrated    Affect:   Calm, Cooperative, and Mood congruent   Thought process:   Logical, linear, clear, coherent, goal directed   Thought content:     No SI   Perceptual disturbances:     Denies auditory and visual hallucinations, behavior not concerning for response to internal stimuli   Orientation:   Oriented to person, place, time, and general circumstances   Attention:   Able to fully attend without fluctuations in consciousness   Concentration:   Able to fully concentrate and attend     Physical Exam:   Gen: NAD  Pulm: No increased work of breathing  Neuro: No tremors, tics, or abnormal movements    Assessment and Plan:   Mood Disorder: Maj Depress D/O, 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 We return to weekly.     Seizure was of marginal length, good amplitude, good morphology, and fair post-ictal suppression.       Interventions Today:    ??? Medication adjustments: Caffeine 90, phenergan 12.5, 10mg  labetalol   ??? Energy adjustments: none  Post-Treatment Complications:  none        Recommendations for Future Treatments:    ??? Medication adjustments: None  ??? Energy adjustments:  none  ??? Frequency: return in  1 week    Next Treatment Date: 10/05/21    Meds to take prior to ECT: None    Special Instructions: Do not take these medications after 6pm on the night before ECT: Klonopin/clonazepam    Assisted by: none    Bess Harvest, MD

## 2021-09-29 NOTE — Unmapped (Signed)
Call from patient that she has reached out to her provider via My Chart. Chart reviewed and the provider has the message.  Alexis Burns

## 2021-10-04 ENCOUNTER — Telehealth: Admit: 2021-10-04 | Discharge: 2021-10-05 | Payer: PRIVATE HEALTH INSURANCE | Attending: Clinical | Primary: Clinical

## 2021-10-04 DIAGNOSIS — F419 Anxiety disorder, unspecified: Principal | ICD-10-CM

## 2021-10-04 DIAGNOSIS — F332 Major depressive disorder, recurrent severe without psychotic features: Principal | ICD-10-CM
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
## 2021-10-05 ENCOUNTER — Encounter: Admit: 2021-10-05 | Discharge: 2021-10-06 | Payer: PRIVATE HEALTH INSURANCE

## 2021-10-05 ENCOUNTER — Ambulatory Visit: Admit: 2021-10-05 | Discharge: 2021-10-06 | Payer: PRIVATE HEALTH INSURANCE

## 2021-10-05 MED ADMIN — caffeine citrate (CAFCIT) injection: INTRAVENOUS | @ 18:00:00 | Stop: 2021-10-05

## 2021-10-05 MED ADMIN — succinylcholine (ANECTINE) injection: INTRAVENOUS | @ 18:00:00 | Stop: 2021-10-05

## 2021-10-05 MED ADMIN — ketorolac (TORADOL) injection: INTRAVENOUS | @ 18:00:00 | Stop: 2021-10-05

## 2021-10-05 MED ADMIN — ondansetron (ZOFRAN) injection: INTRAVENOUS | @ 17:00:00 | Stop: 2021-10-05

## 2021-10-05 MED ADMIN — propofoL (DIPRIVAN) injection: INTRAVENOUS | @ 18:00:00 | Stop: 2021-10-05

## 2021-10-05 MED ADMIN — promethazine (PHENERGAN) injection: INTRAVENOUS | @ 18:00:00 | Stop: 2021-10-05

## 2021-10-05 MED ADMIN — ketamine (KETALAR) injection: INTRAVENOUS | @ 18:00:00 | Stop: 2021-10-05

## 2021-10-05 NOTE — Unmapped (Signed)
[facility] ECT               Procedure Note                                             Requesting Attending Physician: Unknown Per Patient Refe*  Admit Date: 10/05/2021     Service Type: Inpatient  Requesting Attending Physician: Unknown Per Patient Refe*  Service requesting consult: Psychiatry  Consulting service: Psychiatry  Admit Date: 10/05/2021  Service Date: October 05, 2021      Time out was taken with staff to confirm correct patient and correct procedure to be performed.    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 Much improved    Beck Depression Inventory Total Score:     Baseline: 51  Most recent: 38    Mini-Mental Status ExamTotal Score:     Baseline: 25  Most recent: 30    MEDICAL INFORMATION:    Allergies: Haldol [haloperidol lactate], Tylenol [acetaminophen], Cyclosporine, Tetracycline, and Tramadol    Medical History: See ECT Consult    Surgical History: See ECT Consult    VITAL SIGNS:      Pre-procedure:  BP: 134/84  HR: 82    Post-stimulus:   BP: 141/92  HR: 80      THYMATRON:  ECT # Thymatron Settings (%) Modification Seizure   Length Cuff Duration  (sec) EEG Duration  (sec)   1 100 Good Adequate 17 33       Medications given during procedure:   Current Outpatient Medications   Medication Sig Dispense Refill    cloNIDine HCL (CATAPRES) 0.1 MG tablet Start 1/2 tablet nightly for 6 days then can increase to 1/2 tablet twice daily as tolerated. Monitor for sedation and lightheadedness. Please wait to take this medication until after having taken Flexeril for more than 1 week. 60 tablet 0    cyclobenzaprine (FLEXERIL) 5 MG tablet Take 1 tablet (5 mg total) by mouth nightly as needed for muscle spasms. 30 tablet 0    hydrOXYzine (ATARAX) 25 MG tablet Take 1 tablet (25 mg total) by mouth daily as needed for anxiety. 30 tablet 2    semaglutide (OZEMPIC) 1 mg/dose (4 mg/3 mL) PnIj injection Inject 1 mg under the skin every seven (7) days. 9 mL 2    sertraline (ZOLOFT) 100 MG tablet Take 1 tablet (100 mg total) by mouth daily. 30 tablet 1    XIFAXAN 550 mg Tab Take 1 tablet (550 mg total) by mouth Two (2) times a day. 180 tablet 4     No current facility-administered medications for this encounter.     Facility-Administered Medications Ordered in Other Encounters   Medication Dose Route Frequency Provider Last Rate Last Admin    caffeine citrate (CAFCIT) injection   Intravenous PRN (once a day) Angelina Ok, CRNA   90 mg at 10/05/21 1344    ketamine (KETALAR) injection   Intravenous PRN (once a day) Angelina Ok, CRNA   30 mg at 10/05/21 1346    ketorolac (TORADOL) injection  Intravenous PRN (once a day) Angelina Ok, CRNA   15 mg at 10/05/21 1344    ondansetron (ZOFRAN) injection   Intravenous PRN (once a day) Annie Sable, MD   4 mg at 10/05/21 1250    promethazine (PHENERGAN) injection   Intravenous PRN (once a day) Angelina Ok, CRNA   12.5 mg at 10/05/21 1344    propofoL (DIPRIVAN) injection   Intravenous PRN (once a day) Angelina Ok, CRNA   100 mg at 10/05/21 1346    succinylcholine (ANECTINE) injection   Intravenous PRN (once a day) Angelina Ok, CRNA   120 mg at 10/05/21 1348          Neck Collar Used:  Yes     PROGRESS NOTE:     Alexis Burns is a 56 y.o. female who presents for outpatient continuation ECT. Treatments started in the inpatient setting, and she was discharged home on 08/03/2021.     Reports having Good days and bad days, last 2 days has been working. First time working in a while, first part of the week has spent it racing/thoughts/crying almost suicidal, ie life isn't worth living but no plan, reports feeling better over the past few days. Eating okay, on ozempic. Sleep is poor, wakes up every few hours at night. No bad side effects last ECT treatment, does notice some word finding difficulties w/ ECT treatments, discussed plan to eventually space out treatments. Okay w/ memory loss due to being glad she forgot some things. Discussed risks and benefits of adding ketamine today, patient is amenable to plan. She would like to return in 1 week.    Mental Status Exam:  Appearance:    Well nourished and Well developed   Behavior:  Calm and Cooperative   Motor:   No abnormal movements   Speech/Language:    Normal rate, volume, tone, fluency and Language intact, well formed   Mood:   Good and bad days   Affect:   Calm, Cooperative, and Mood congruent   Thought process:   Logical, linear, clear, coherent, goal directed   Thought content:     Reports passive suicidal ideation earlier this week, no plan or intent to harm herself   Perceptual disturbances:     Denies auditory and visual hallucinations, behavior not concerning for response to internal stimuli   Orientation:   Oriented to person, place, time, and general circumstances   Attention:   Able to fully attend without fluctuations in consciousness   Concentration:   Able to fully concentrate and attend     Physical Exam:   Gen: NAD  Pulm: No increased work of breathing  Neuro: No tremors, tics, or abnormal movements    Assessment and Plan:   Mood Disorder: Maj Depress D/O, 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 Reports some ongoing depressive symptoms, although noted some improvement over the past few days. Will continue with weekly treatment interval.    Seizure was of adequate length, good amplitude, good morphology, and good post-ictal suppression.     Interventions Today:    Medication adjustments: decreased propofol to 100 mg, started ketamine 30 mg  Energy adjustments: none    Post-Treatment Complications:  none      Recommendations for Future Treatments: Medication adjustments: None  Energy adjustments:  none  Frequency: return in  1 week      Next Treatment Date: 10/12/21    Meds to take prior to ECT: None    Special Instructions:  none    Assisted by: Marygrace Drought, MD    Matthew Folks, MD

## 2021-10-08 ENCOUNTER — Telehealth: Admit: 2021-10-08 | Discharge: 2021-10-09 | Payer: PRIVATE HEALTH INSURANCE | Attending: Clinical | Primary: Clinical

## 2021-10-08 DIAGNOSIS — F411 Generalized anxiety disorder: Principal | ICD-10-CM

## 2021-10-08 DIAGNOSIS — F419 Anxiety disorder, unspecified: Principal | ICD-10-CM

## 2021-10-08 DIAGNOSIS — F332 Major depressive disorder, recurrent severe without psychotic features: Principal | ICD-10-CM

## 2021-10-08 MED ORDER — HYDROXYZINE HCL 25 MG TABLET
ORAL_TABLET | Freq: Every day | ORAL | 2 refills | 30 days | PRN
Start: 2021-10-08 — End: ?
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
## 2021-10-09 DIAGNOSIS — F411 Generalized anxiety disorder: Principal | ICD-10-CM

## 2021-10-09 MED ORDER — HYDROXYZINE HCL 25 MG TABLET
ORAL_TABLET | Freq: Every day | ORAL | 2 refills | 30 days | Status: CP | PRN
Start: 2021-10-09 — End: ?

## 2021-10-10 NOTE — Unmapped (Signed)
Received MyChart message that patient is requesting refill of hydroxyzine 25 mg daily PRN. Refill sent to preferred pharmacy.

## 2021-10-11 ENCOUNTER — Ambulatory Visit
Admit: 2021-10-11 | Discharge: 2021-10-12 | Payer: PRIVATE HEALTH INSURANCE | Attending: Family Medicine | Primary: Family Medicine

## 2021-10-11 DIAGNOSIS — R7303 Prediabetes: Principal | ICD-10-CM

## 2021-10-11 MED ORDER — OZEMPIC 2 MG/DOSE (8 MG/3 ML) SUBCUTANEOUS PEN INJECTOR
SUBCUTANEOUS | 3 refills | 28 days | Status: CP
Start: 2021-10-11 — End: 2021-11-10

## 2021-10-11 NOTE — Unmapped (Addendum)
[facility] ECT               Procedure Note                                             Requesting Attending Physician: Unknown Per Patient Refe*  Admit Date: 10/12/2021     Service Type: Inpatient  Requesting Attending Physician: Unknown Per Patient Refe*  Service requesting consult: Psychiatry  Consulting service: Psychiatry  Admit Date: 10/12/2021  Service Date: October 12, 2021      Time out was taken with staff to confirm correct patient and correct procedure to be performed.    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 Much improved    Beck Depression Inventory Total Score:     Baseline: 51  Most recent: 33    Mini-Mental Status ExamTotal Score:     Baseline: 25  Most recent: 30    MEDICAL INFORMATION:    Allergies: Haldol [haloperidol lactate], Tylenol [acetaminophen], Cyclosporine, Tetracycline, and Tramadol    Medical History: See ECT Consult    Surgical History: See ECT Consult    VITALS Blood Pressure Pulse   Pre-Procedure 179/103 73   Post-Procedure 180/78 90       THYMATRON:  ECT # Thymatron Settings (%) Modification Seizure   Length Cuff Duration  (sec) EEG Duration  (sec)   1 100 Good Adequate 14 23       Medications given during procedure:   Current Outpatient Medications   Medication Sig Dispense Refill    cloNIDine HCL (CATAPRES) 0.1 MG tablet Start 1/2 tablet nightly for 6 days then can increase to 1/2 tablet twice daily as tolerated. Monitor for sedation and lightheadedness. Please wait to take this medication until after having taken Flexeril for more than 1 week. 60 tablet 0    cyclobenzaprine (FLEXERIL) 5 MG tablet Take 1 tablet (5 mg total) by mouth nightly as needed for muscle spasms. 30 tablet 0    hydrOXYzine (ATARAX) 25 MG tablet Take 1 tablet (25 mg total) by mouth daily as needed for anxiety. 30 tablet 2    semaglutide (OZEMPIC) 2 mg/dose (8 mg/3 mL) PnIj Inject 2 mg under the skin every seven (7) days. 3 mL 3    sertraline (ZOLOFT) 100 MG tablet Take 1 tablet (100 mg total) by mouth daily. 30 tablet 1    XIFAXAN 550 mg Tab Take 1 tablet (550 mg total) by mouth Two (2) times a day. 180 tablet 4     No current facility-administered medications for this encounter.     Facility-Administered Medications Ordered in Other Encounters   Medication Dose Route Frequency Provider Last Rate Last Admin    caffeine citrate (CAFCIT) injection   Intravenous PRN (once a day) Quentin Cornwall, CRNA   90 mg at 10/12/21 1310    ketamine (KETALAR) injection   Intravenous PRN (once a day) Quentin Cornwall, CRNA   30 mg at 10/12/21 1311    ketorolac (TORADOL) injection   Intravenous PRN (once a day) Quentin Cornwall, CRNA  15 mg at 10/12/21 1308    ondansetron (ZOFRAN) injection   Intravenous PRN (once a day) Quentin Cornwall, CRNA   4 mg at 10/12/21 1310    promethazine (PHENERGAN) injection   Intravenous PRN (once a day) Quentin Cornwall, CRNA   12.5 mg at 10/12/21 1310    propofoL (DIPRIVAN) injection   Intravenous PRN (once a day) Quentin Cornwall, CRNA   90 mg at 10/12/21 1311    sodium chloride (NS) 0.9 % infusion   Intravenous Continuous PRN Quentin Cornwall, CRNA   New Bag at 10/12/21 1303    succinylcholine (ANECTINE) injection   Intravenous PRN (once a day) Quentin Cornwall, CRNA   120 mg at 10/12/21 1312          Neck Collar Used:  Yes     PROGRESS NOTE:     Alexis Burns is a 56 y.o. female who presents for outpatient continuation ECT. Treatments started in the inpatient setting, and she was discharged home on 08/03/2021.     Reports feeling very anxious today. Feels her anxiety is higher than it typically is before ECT. Reports she felt good after ECT until 3 days ago when she started feeling depressed again. Is sleeping well. Appetite is lower than baseline 2/2 Ozepmic. Notes post-ECT headache that resolves in 24 hours and some forgetfulness. Denies SI/HI. Denies AH/VH.     Mental Status Exam:  Appearance:    Well nourished and Well developed   Behavior:  Calm and Cooperative   Motor:   No abnormal movements   Speech/Language:    Normal rate, volume, tone, fluency and Language intact, well formed   Mood:   Very anxious   Affect:   Anxious and Cooperative   Thought process:   Logical, linear, clear, coherent, goal directed   Thought content:     Reports passive suicidal ideation earlier this week, no plan or intent to harm herself   Perceptual disturbances:     Denies auditory and visual hallucinations, behavior not concerning for response to internal stimuli   Orientation:   Oriented to person, place, time, and general circumstances   Attention:   Able to fully attend without fluctuations in consciousness   Concentration:   Able to fully concentrate and attend     Physical Exam:   Gen: NAD  Pulm: No increased work of breathing  Neuro: No tremors, tics, or abnormal movements    Assessment and Plan:   Mood Disorder: Maj Depress D/O, 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 Felt better after last treatment, but has had Reports some ongoing depressive symptoms, worse over the past couple of days. Will continue with weekly treatment interval.    Seizure was of adequate length, fair amplitude, fair morphology, and poor post-ictal suppression.    Interventions Today:    Medication adjustments: none  Energy adjustments: none    Post-Treatment Complications: Hypertension treated with labetalol     Recommendations for Future Treatments:    Medication adjustments:  decrease propofol to 80 mg and increase ketamine to 60 mg  Energy adjustments:  none , consider switching to bitemporal placement  Frequency: return in  1 week      Next Treatment Date: 10/19/21    Meds to take prior to ECT: None    Special Instructions: none    Assisted by: Rollene Rotunda, MD    Laurin Coder, MD

## 2021-10-11 NOTE — Unmapped (Addendum)
Alexis Burns is a 56 y.o. female with Class 3 obesity due to weight gain associated with unhealthy lifestyle, including physical inactivity and frequent consumption of ultraprocessed foods and products with high amounts of added sugars, including Mt. Dew. Additionally, Pt gained weight with smoking cessation and post-partum weight retention. Comorbidities include prediabetes, hyperlipidemia, hypertension, cirrhosis, depression. Barriers include BED, depression/anxiety, ankle and back pain.    Weight Summary:  Starting weight/BMI/WC/[place]: 245 lbs, BMI 40.77, WC 44, [place] 14 (04/05/2021)  Target weight/goal: No goal weight. Patient wants general improvement in health and appearance.  Today's weight/BMI: (!) 104.8 kg (231 lb 1.9 oz), BMI (!) 37.32 (10/11/2021)  % Body weight loss: N/A  Today's Waist Circumference: 43 inches (10/11/2021)  Today's visit number: 4th  Duration in program: 6 mos    Referred by: Self, Referred    GOALS: none at this time, focus on mental health    I have reviewed the patient's medical history, lifestyle history and labs/tests.   My recommendations include the following:    Lifestyle Pattern Summary: Pt is actively working through depressive episode and is working closely with psychiatrist.   - See GOALS above.     Medication: Taking Ozempic as directed. Tolerating well. Denies s/e. Discussed and decided to increase current dose. Prescription sent. Pt verbalized understanding and agreement with plan.    START Ozempic 2.0 mg once weekly injections  Tried/Contraindicated:  - Wellbutrin: CI d/t anxiety  - Topiramate: CI d/t Hx of nephroliathisis (confirmed by CT)     Obesity Surgery: Declines for now. Wishes to try medical management but will consider in the future. Pt has had ~10 surgeries including C-section x2, cholecystectomy, hysterectomy & other musculoskeletal surgeries. Pt will keep all options open, especially due to multiple comorbidities including cirrhosis. (Updated 04/05/2021)    Medical conditions:  Glaucoma: no  Seizures: no  Medullary thyroid cancer (personal or family hx): no  Multiple Endocrine Neoplasia: no  Palpitations/Tachycardia: no  Chest Pain: no past history of MI.   Headaches/Migraines: YES  Nephrolithiasis: YES  H/o pancreatitis: YES -- gallstone pancreatitis 35 yrs ago  GERD: no     Prior Surgeries:  Cholecystectomy: YES  Hysterectomy: YES    Birth Control Methods: N/A

## 2021-10-11 NOTE — Unmapped (Signed)
Hx of preDM. Reviewed relevant medications and labs. Pt is following our [facility]. See Obesity tab for medication changes.

## 2021-10-11 NOTE — Unmapped (Signed)
[facility] Refill Coordination Note    Specialty Medication(s) to be Shipped:   Infectious Disease: Xifaxan    Other medication(s) to be shipped: No additional medications requested for fill at this time     Alexis Burns, DOB: 02-16-1966  Phone: [phone] (home)       All above HIPAA information was verified with patient.     Was a Nurse, learning disability used for this call? No    Completed refill call assessment today to schedule patient's medication shipment from the [facility] [phone]).  All relevant notes have been reviewed.     Specialty medication(s) and dose(s) confirmed: Regimen is correct and unchanged.   Changes to medications: Larose reports no changes at this time.  Changes to insurance: No  New side effects reported not previously addressed with a pharmacist or physician: None reported  Questions for the pharmacist: No    Confirmed patient received a Conservation officer, historic buildings and a Surveyor, mining with first shipment. The patient will receive a drug information handout for each medication shipped and additional FDA Medication Guides as required.       DISEASE/MEDICATION-SPECIFIC INFORMATION        N/A    SPECIALTY MEDICATION ADHERENCE     Medication Adherence    Patient reported X missed doses in the last month: 0  Specialty Medication: XIFAXAN 550 mg  Patient is on additional specialty medications: No                                Were doses missed due to medication being on hold? No    Xifaxan 550 mg: 5 days of medicine on hand        REFERRAL TO PHARMACIST     Referral to the pharmacist: Not needed      [facility]     Shipping address confirmed in Epic.     Delivery Scheduled: Yes, Expected medication delivery date: 10/16/21.     Medication will be delivered via Next Day Courier to the prescription address in Epic [name].    Unk Lightning   [facility] Specialty Technician
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
## 2021-10-12 ENCOUNTER — Ambulatory Visit: Admit: 2021-10-12 | Discharge: 2021-10-13 | Payer: PRIVATE HEALTH INSURANCE

## 2021-10-12 ENCOUNTER — Encounter: Admit: 2021-10-12 | Discharge: 2021-10-13 | Payer: PRIVATE HEALTH INSURANCE

## 2021-10-12 MED ADMIN — succinylcholine (ANECTINE) injection: INTRAVENOUS | @ 17:00:00 | Stop: 2021-10-12

## 2021-10-12 MED ADMIN — propofoL (DIPRIVAN) injection: INTRAVENOUS | @ 17:00:00 | Stop: 2021-10-12

## 2021-10-12 MED ADMIN — sodium chloride (NS) 0.9 % infusion: INTRAVENOUS | @ 17:00:00 | Stop: 2021-10-12

## 2021-10-12 MED ADMIN — ketorolac (TORADOL) injection: INTRAVENOUS | @ 17:00:00 | Stop: 2021-10-12

## 2021-10-12 MED ADMIN — ketamine (KETALAR) injection: INTRAVENOUS | @ 17:00:00 | Stop: 2021-10-12

## 2021-10-12 MED ADMIN — promethazine (PHENERGAN) injection: INTRAVENOUS | @ 17:00:00 | Stop: 2021-10-12

## 2021-10-12 MED ADMIN — labetalol (NORMODYNE) injection: INTRAVENOUS | @ 17:00:00 | Stop: 2021-10-12

## 2021-10-12 MED ADMIN — ondansetron (ZOFRAN) injection: INTRAVENOUS | @ 17:00:00 | Stop: 2021-10-12

## 2021-10-12 MED ADMIN — caffeine citrate (CAFCIT) injection: INTRAVENOUS | @ 17:00:00 | Stop: 2021-10-12

## 2021-10-15 MED FILL — XIFAXAN 550 MG TABLET: ORAL | 30 days supply | Qty: 60 | Fill #3

## 2021-10-18 ENCOUNTER — Telehealth: Admit: 2021-10-18 | Discharge: 2021-10-19 | Payer: PRIVATE HEALTH INSURANCE | Attending: Clinical | Primary: Clinical

## 2021-10-18 DIAGNOSIS — F419 Anxiety disorder, unspecified: Principal | ICD-10-CM

## 2021-10-18 DIAGNOSIS — F332 Major depressive disorder, recurrent severe without psychotic features: Principal | ICD-10-CM
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
## 2021-10-19 ENCOUNTER — Encounter: Admit: 2021-10-19 | Discharge: 2021-10-20 | Payer: PRIVATE HEALTH INSURANCE

## 2021-10-19 ENCOUNTER — Ambulatory Visit: Admit: 2021-10-19 | Discharge: 2021-10-20 | Payer: PRIVATE HEALTH INSURANCE

## 2021-10-19 MED ADMIN — ketamine (KETALAR) injection: INTRAVENOUS | @ 19:00:00 | Stop: 2021-10-19

## 2021-10-19 MED ADMIN — ketorolac (TORADOL) injection: INTRAVENOUS | @ 19:00:00 | Stop: 2021-10-19

## 2021-10-19 MED ADMIN — succinylcholine (ANECTINE) injection: INTRAVENOUS | @ 19:00:00 | Stop: 2021-10-19

## 2021-10-19 MED ADMIN — propofoL (DIPRIVAN) injection: INTRAVENOUS | @ 19:00:00 | Stop: 2021-10-19

## 2021-10-19 MED ADMIN — sodium chloride (NS) 0.9 % infusion: INTRAVENOUS | @ 19:00:00 | Stop: 2021-10-19

## 2021-10-19 MED ADMIN — labetalol (NORMODYNE) injection: INTRAVENOUS | @ 19:00:00 | Stop: 2021-10-19

## 2021-10-19 MED ADMIN — promethazine (PHENERGAN) injection: INTRAVENOUS | @ 19:00:00 | Stop: 2021-10-19

## 2021-10-19 MED ADMIN — caffeine citrate (CAFCIT) injection: INTRAVENOUS | @ 19:00:00 | Stop: 2021-10-19

## 2021-10-19 MED ADMIN — ondansetron (ZOFRAN) injection: INTRAVENOUS | @ 19:00:00 | Stop: 2021-10-19

## 2021-10-19 NOTE — Unmapped (Signed)
[facility]               Procedure Note                                             Requesting Attending Physician: Not In System Provider  Admit Date: 10/19/2021     Service Type: Inpatient  Requesting Attending Physician: Not In System Provider  Service requesting consult: Psychiatry  Consulting service: Psychiatry  Admit Date: 10/19/2021  Service Date: October 19, 2021    Time out was taken with staff to confirm correct patient and correct procedure to be performed.    INDICATION FOR ECT: Mood Disorder: Maj Depress D/O, REC  Severe WITHOUT psychotic behavior    TREATMENT HISTORY:    Total Number of Treatments: 18  Current Treatment #: 8  Treatment Type: Continuation  Electrode Placement: Left Frontal Right Temporal    RATING SCALES:    CGI-Change score for ECT series: CGI-C: 2. Much improved    Beck Depression Inventory Total Score:     Baseline: 51  Most recent: 33    Mini-Mental Status ExamTotal Score:     Baseline: 25  Most recent: 30    MEDICAL INFORMATION:    Allergies: Haldol [haloperidol lactate], Tylenol [acetaminophen], Cyclosporine, Tetracycline, and Tramadol    Medical History: See ECT Consult    Surgical History: See ECT Consult    VITAL SIGNS:      Pre-procedure:    There were no vitals filed for this visit.  161/108  82    Post-stimulus:     No data found.  182/116  78      THYMATRON:  ECT # Thymatron Settings (%) Modification Seizure   Length Cuff Duration  (sec) EEG Duration  (sec)   1 100 Mild Adequate 22 28       Medications given during procedure:   Current Outpatient Medications   Medication Sig Dispense Refill    cloNIDine HCL (CATAPRES) 0.1 MG tablet Start 1/2 tablet nightly for 6 days then can increase to 1/2 tablet twice daily as tolerated. Monitor for sedation and lightheadedness. Please wait to take this medication until after having taken Flexeril for more than 1 week. 60 tablet 0    cyclobenzaprine (FLEXERIL) 5 MG tablet Take 1 tablet (5 mg total) by mouth nightly as needed for muscle spasms. 30 tablet 0    hydrOXYzine (ATARAX) 25 MG tablet Take 1 tablet (25 mg total) by mouth daily as needed for anxiety. 30 tablet 2    semaglutide (OZEMPIC) 2 mg/dose (8 mg/3 mL) PnIj Inject 2 mg under the skin every seven (7) days. 3 mL 3    sertraline (ZOLOFT) 100 MG tablet Take 1 tablet (100 mg total) by mouth daily. 30 tablet 1    XIFAXAN 550 mg Tab Take 1 tablet (550 mg total) by mouth Two (2) times a day. 180 tablet 4     No current facility-administered medications for this encounter.     Facility-Administered Medications Ordered in Other Encounters   Medication Dose Route Frequency Provider Last Rate Last Admin    caffeine citrate (CAFCIT) injection   Intravenous PRN (once a day) Quentin Cornwall, CRNA   90 mg at 10/19/21 1441    ketamine (KETALAR) injection   Intravenous PRN (once a day) Quentin Cornwall, CRNA   60 mg at  10/19/21 1443    ketorolac (TORADOL) injection   Intravenous PRN (once a day) Quentin Cornwall, CRNA   15 mg at 10/19/21 1443    labetalol (NORMODYNE) injection   Intravenous PRN (once a day) Quentin Cornwall, CRNA   5 mg at 10/19/21 1450    ondansetron (ZOFRAN) injection   Intravenous PRN (once a day) Quentin Cornwall, CRNA   4 mg at 10/19/21 1443    promethazine (PHENERGAN) injection   Intravenous PRN (once a day) Quentin Cornwall, CRNA   12.5 mg at 10/19/21 1443    propofoL (DIPRIVAN) injection   Intravenous PRN (once a day) Quentin Cornwall, CRNA   80 mg at 10/19/21 1443    sodium chloride (NS) 0.9 % infusion   Intravenous Continuous PRN Quentin Cornwall, CRNA   New Bag at 10/19/21 1437    succinylcholine (ANECTINE) injection   Intravenous PRN (once a day) Quentin Cornwall, CRNA   120 mg at 10/19/21 1444          Neck Collar Used:  Yes     PROGRESS NOTE:     Alexis Burns is a 56 y.o. female who presents for outpatient continuation ECT. Treatments started in the inpatient setting, and she was discharged home on 08/03/2021.     Patient states that the two treatment ago was the first time ketamine was used in treatment and she noticed a big improvement. Reports that last time she did not have as good of benefit. Reports on Wednesday she had a CBD gummy and felt that it was helpful. Inquires about whether this would interfere with ECT. Also states she had significant neck pain after last treatment, and didn't have this before. Denies SI/HI. Denies AH/VH     Mental Status Exam:  Appearance:    Well nourished and Well developed   Behavior:  Calm and Cooperative   Motor:   No abnormal movements   Speech/Language:    Normal rate, volume, tone, fluency and Language intact, well formed   Mood:   better   Affect:   Anxious and Cooperative, brighter, makes several jokes   Thought process:   Logical, linear, clear, coherent, goal directed   Thought content:     Reports passive suicidal ideation earlier this week, no plan or intent to harm herself   Perceptual disturbances:     Denies auditory and visual hallucinations, behavior not concerning for response to internal stimuli   Orientation:   Oriented to person, place, time, and general circumstances   Attention:   Able to fully attend without fluctuations in consciousness   Concentration:   Able to fully concentrate and attend     Physical Exam:   Gen: NAD  Pulm: No increased work of breathing  Neuro: No tremors, tics, or abnormal movements    Assessment and Plan:   Mood Disorder: Maj Depress D/O, 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 Felt better after last treatment, but has had Reports some ongoing depressive symptoms, worse over the past couple of days. Will continue with weekly treatment interval.    Seizure was of adequate length, fair amplitude, fair morphology, and poor post-ictal suppression.    Interventions Today:    Medication adjustments: decrease propofol to 80 mg and increase ketamine to 60 mg  Energy adjustments: none    Post-Treatment Complications: Hypertension treated with labetalol 10mg      Recommendations for Future Treatments:    Medication adjustments:  Consider decreasing propofol further and increasing  ketamine  Energy adjustments:  none    Frequency: return in  1 week      Next Treatment Date: 10/26/21    Meds to take prior to ECT: None    Special Instructions: none    Assisted by: Rollene Rotunda, MD    Iran Planas, MD

## 2021-10-26 ENCOUNTER — Encounter
Admit: 2021-10-26 | Discharge: 2021-10-27 | Payer: PRIVATE HEALTH INSURANCE | Attending: Student in an Organized Health Care Education/Training Program | Primary: Student in an Organized Health Care Education/Training Program

## 2021-10-26 ENCOUNTER — Ambulatory Visit: Admit: 2021-10-26 | Discharge: 2021-10-27 | Payer: PRIVATE HEALTH INSURANCE

## 2021-10-26 MED ADMIN — ketamine (KETALAR) injection: INTRAVENOUS | @ 18:00:00 | Stop: 2021-10-26

## 2021-10-26 MED ADMIN — ondansetron (ZOFRAN) injection: INTRAVENOUS | @ 18:00:00 | Stop: 2021-10-26

## 2021-10-26 MED ADMIN — succinylcholine (ANECTINE) injection: INTRAVENOUS | @ 18:00:00 | Stop: 2021-10-26

## 2021-10-26 MED ADMIN — promethazine (PHENERGAN) injection: INTRAVENOUS | @ 18:00:00 | Stop: 2021-10-26

## 2021-10-26 MED ADMIN — propofoL (DIPRIVAN) injection: INTRAVENOUS | @ 18:00:00 | Stop: 2021-10-26

## 2021-10-26 MED ADMIN — caffeine citrate (CAFCIT) injection: INTRAVENOUS | @ 18:00:00 | Stop: 2021-10-26

## 2021-10-26 MED ADMIN — ketorolac (TORADOL) injection: INTRAVENOUS | @ 18:00:00 | Stop: 2021-10-26

## 2021-10-26 MED ADMIN — labetalol (NORMODYNE) injection: INTRAVENOUS | @ 19:00:00 | Stop: 2021-10-26

## 2021-10-26 NOTE — Unmapped (Signed)
[facility]               Procedure Note                                             Requesting Attending Physician: Unknown Per Patient Refe*  Admit Date: 10/26/2021     Service Type: Inpatient  Requesting Attending Physician: Unknown Per Patient Refe*  Service requesting consult: Psychiatry  Consulting service: Psychiatry  Admit Date: 10/26/2021  Service Date: October 26, 2021    Time out was taken with staff to confirm correct patient and correct procedure to be performed.    INDICATION FOR ECT: Mood Disorder: Maj Depress D/O, REC  Severe WITHOUT psychotic behavior    TREATMENT HISTORY:    Total Number of Treatments: 19  Current Treatment #: 9  Treatment Type: Continuation  Electrode Placement: Left Frontal Right Temporal    RATING SCALES:    CGI-Change score for ECT series: CGI-C: 2. Much improved    Beck Depression Inventory Total Score:     Baseline: 51  Most recent: 33    Mini-Mental Status ExamTotal Score:     Baseline: 25  Most recent: 30    MEDICAL INFORMATION:    Allergies: Haldol [haloperidol lactate], Tylenol [acetaminophen], Cyclosporine, Tetracycline, and Tramadol    Medical History: See ECT Consult    Surgical History: See ECT Consult    VITAL SIGNS:      Pre-procedure:    There were no vitals filed for this visit.  164/97  79    Post-stimulus:     No data found.  143/89  73      THYMATRON:  ECT # Thymatron Settings (%) Modification Seizure   Length Cuff Duration  (sec) EEG Duration  (sec)   1 100 Mild Adequate 30 55       Medications given during procedure:   Current Outpatient Medications   Medication Sig Dispense Refill    cloNIDine HCL (CATAPRES) 0.1 MG tablet Start 1/2 tablet nightly for 6 days then can increase to 1/2 tablet twice daily as tolerated. Monitor for sedation and lightheadedness. Please wait to take this medication until after having taken Flexeril for more than 1 week. 60 tablet 0    cyclobenzaprine (FLEXERIL) 5 MG tablet Take 1 tablet (5 mg total) by mouth nightly as needed for muscle spasms. 30 tablet 0    hydrOXYzine (ATARAX) 25 MG tablet Take 1 tablet (25 mg total) by mouth daily as needed for anxiety. 30 tablet 2    semaglutide (OZEMPIC) 2 mg/dose (8 mg/3 mL) PnIj Inject 2 mg under the skin every seven (7) days. 3 mL 3    sertraline (ZOLOFT) 100 MG tablet Take 1 tablet (100 mg total) by mouth daily. 30 tablet 1    XIFAXAN 550 mg Tab Take 1 tablet (550 mg total) by mouth Two (2) times a day. 180 tablet 4     No current facility-administered medications for this encounter.     Facility-Administered Medications Ordered in Other Encounters   Medication Dose Route Frequency Provider Last Rate Last Admin    caffeine citrate (CAFCIT) injection   Intravenous PRN (once a day) Reita May, MD   120 mg at 10/26/21 1425    ketamine (KETALAR) injection   Intravenous PRN (once a day) Reita May, MD   60 mg at  10/26/21 1425    ketorolac (TORADOL) injection   Intravenous PRN (once a day) Reita May, MD   15 mg at 10/26/21 1425    labetalol (NORMODYNE) injection   Intravenous PRN (once a day) Reita May, MD   10 mg at 10/26/21 1431    ondansetron (ZOFRAN) injection   Intravenous PRN (once a day) Reita May, MD   4 mg at 10/26/21 1425    promethazine (PHENERGAN) injection   Intravenous PRN (once a day) Reita May, MD   12.5 mg at 10/26/21 1425    propofoL (DIPRIVAN) injection   Intravenous PRN (once a day) Reita May, MD   80 mg at 10/26/21 1425    succinylcholine (ANECTINE) injection   Intravenous PRN (once a day) Reita May, MD   120 mg at 10/26/21 1425          Neck Collar Used:  Yes     PROGRESS NOTE:     Alexis Burns is a 56 y.o. female who presents for outpatient continuation ECT. Treatments started in the inpatient setting, and she was discharged home on 08/03/2021.     Patient reports doing really well. Denies SI/HI. Denies AH/VH. Feels she bit her tongue at last visit, but did not lacerate it, maybe bruised it, took a few days to feel better, now healed. Discussed with anesthesiology. Feels memory is very poor, does not remember recent vacations or outing a [place] a few days ago. Would like to space to q2 weeks. Sleep has been poor the last few weeks. Appetite is good.      Mental Status Exam:  Appearance:    Well nourished and Well developed   Behavior:  Calm and Cooperative   Motor:   No abnormal movements   Speech/Language:    Normal rate, volume, tone, fluency and Language intact, well formed   Mood:   Really well   Affect:   Calm, euthymic, cooperative, brighter, makes several jokes   Thought process:   Logical, linear, clear, coherent, goal directed   Thought content:     Reports passive suicidal ideation earlier this week, no plan or intent to harm herself   Perceptual disturbances:     Denies auditory and visual hallucinations, behavior not concerning for response to internal stimuli   Orientation:   Oriented to person, place, time, and general circumstances   Attention:   Able to fully attend without fluctuations in consciousness   Concentration:   Able to fully concentrate and attend     Physical Exam:   Gen: NAD  Pulm: No increased work of breathing  Neuro: No tremors, tics, or abnormal movements    Assessment and Plan:   Mood Disorder: Maj Depress D/O, 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     Seizure was of adequate length, fair amplitude, fair morphology, and poor post-ictal suppression.    Interventions Today:    Medication adjustments: increased caffeine to 120mg   Energy adjustments: none    Post-Treatment Complications: Hypertension treated with labetalol 10mg      Recommendations for Future Treatments:    Medication adjustments:  none  Energy adjustments:  Decrease energy to 90%    Frequency: return in  2 weeks      Next Treatment Date: 11/09/21    Meds to take prior to ECT: None    Special Instructions: none    Assisted by: None    Iran Planas, MD

## 2021-11-01 ENCOUNTER — Telehealth: Admit: 2021-11-01 | Discharge: 2021-11-02 | Payer: PRIVATE HEALTH INSURANCE | Attending: Clinical | Primary: Clinical

## 2021-11-01 DIAGNOSIS — F332 Major depressive disorder, recurrent severe without psychotic features: Principal | ICD-10-CM

## 2021-11-01 DIAGNOSIS — F419 Anxiety disorder, unspecified: Principal | ICD-10-CM
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
## 2021-11-05 NOTE — Unmapped (Unsigned)
[facility]  Psychiatry   Established Patient E&M Service - Outpatient       Assessment:  Alexis Burns is a 56 y.o. female with a history of depression, hypertension, and anxiety, who presents for follow-up evaluation.     Today, the patient presents with stable mood. She reports significant benefit from current ECT treatments, specifically with depressive symptoms and feeling suspicious of others. She does endorse a headache as a frequent side effect of these treatments, and is planning to start Flexeril that has been prescribed by her PCP. No acute safety concerns. Her psychotherapy sessions have helped her navigate challenging relationships with her family members. 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 Will initiate clonidine to manage this symptom. No further medicaiton changes. The patient may benefit from a future increase in Zoloft if her depressive symptoms return/worsen. Plan for follow up 4 weeks. Alexis Burns verbalized understanding and is agreeable with plan.     Identifying Information: Alexis Burns is an ADTC patient since 2019 with a history of depression and anxiety, with past hospitalization in 2017 at [facility]. Past medication trials include Prozac (stopped working), risperidone (brief trial), lithium (intolerable side effects), buspirone (unhelpful at 20mg  TID), duloxetine (90mg  well-tolerated but not helpful on its own), aripiprazole (as adjunct for duloxetine, eventually stopped due to tremors), Wellbutrin (anxiety, dry mouth), Seroquel (ineffective), Cytomel (dry mouth, joint pain). Over several years until late 2020, she was fairly stable on duloxetine 90mg  and Abilify 5mg . She used to work in a Engineer, petroleum pre-pandemic in a stressful role, took time off for a leg injury, and then when she went back to work she was fired. This was destabilizing, and it was closely followed by tibial tendon surgery in and then suspected COVID infection. By Feb 2021, mood remained unstable, but Abilify was causing significant shakiness even at tapered doses, and she discontinued it. She had an exacerbation of depression in May 2021 with her son's mental health as a major stressor, and after intolerance of increasing Cymbalta to 120mg , ineffectiveness of Seroquel, intolerance of Wellbutrin, and time-limited benefit of risperidone, in September 2021 she was admitted to the [facility] Crisis Unit for suicidal ideation with a plan to shoot herself. They started Cytomel but she did not tolerate it. She noticed some abnormal tongue movements while on Abilify and these worsened after Abilify discontinuation. We considered the possibility of TD, but neurology evaluation labeled the tongue movements as tic-like, and they also diagnosed her with essential tremor. She started to show signs of liver dysfunction on her bloodwork, and in December 2021 we made the switch from duloxetine to Prozac. This was tolerated well and effective for mood, and mood has varied with family stressors. Both insomnia and hypersomnia have been problems for Alexis Burns, and prns have been very helpful, by spring 2022 sleep had improved with more physical activity. Alexis Burns was eventually restarted on Klonopin in Spring 2023 given severe anxiety/panic that caused her to be unable to leave her house for several weeks. Additionally, she experienced recurrence of depressive symptoms and passive suicidal ideation. Given how well she had done on an SNRI in the past, decision was made to cross taper Prozac to Pristiq. Given continued depressed mood, Alexis Burns was subsequently referred to interventional psychiatry ECT clinic in April 2023. Due to severity of symptoms, Alexis Burns was directly admitted to [facility] to start inpatient ECT and she was transitioned  from Pristiq to sertraline. Klonopin was discontinued and Hydroxyzine PRN was started to target anxiety. She continued ECT as an outpatient.     Risk Assessment:  A suicide and violence risk assessment was performed at our last visit, and safety assessment remains uncharged: she is not at acute risk of harm to herself but is at chronic risk, and we will continue assessing safety at future evaluations.  Stressors: death of father in Sep 18, 2012 and mother in 2015-09-19, chronic depressive symptoms, financial stressors, unemployment, global pandemic and likely COVID19 infection, stressful relationships by son (improving).     Plan:  Problem #1: MDD:  Status: Chronic  Interventions:  - Continue sertraline 50 mg daily   - Patient receiving weekly ECT with [place]  - Continue weekly therapy with [name]    Problem #2: GAD:    Status of problem: chronic with acute exacerbation  - Continue hydroxyzine 25 mg daily as needed for anxiety     Problem #3: Insomnia and excessive daytime sleepiness  Status of problem:  improved or improving with exercise  Interventions:   - Patient previously reported benefit in mood/fatigue from Vyvanse, but have discussed that given lack of childhood ADHD diagnosis and without sleep study we are not inclined to re-initiate this medication    Problem #4: Abnormal tongue movements  Status of problem: chronic  Interventions:  - Neurology labeled her tongue movements as tic-related, and suggested therapies to help. We had considered if this was a symptoms of tardive dyskinesia as it worsened after Abilify discontinuation which can happen. It originally diminished with stopping artifical sweeteners, but then came back. Neurology's assessment is reassuring  - Initiate clonidine 0.05 mg nightly for 6 days, then can increase to 0.05 mg BID as tolerated. Patient is to start this medication after having taken Flexeril for more than 1 week. Monitor for sedation and lightheadedness. May benefit from transitioning to guanfacine if experiences excessive sedation.     Problem: #5: Headache following ECT treatment  Status of problem:  new problem to this provider  Interventions:  - Continue Flexeril 5 mg nightly PRN (prescribed by PCP)  - Consider ibuprofen 400-800 mg PRN (advised patient to discuss this with PCP)    Psychotherapy:  No billable psychotherapy service provided.    Patient has been given this writer's contact information as well as the [facility] Psych[facility] urgent line number. The patient has been instructed to call 911 for emergencies.    Patient was seen and plan of care was discussed with the Attending MD, Dr. Gerrit Friends, who agrees with the above statement and plan.    Jiles Crocker, MD      Subjective:     Psychiatric Chief Concern:  Follow-up psychiatric evaluation for mood    Interval History:  Changes  Stressors  Therapy  Meds/SE  Mood/Anxiety/Sleep/Appetite  SI/HI  AVH  Substances  ROS    Values: What is important to you? Who is important to you? What qualities do you admire about them?  Towards Moves: If I had a magic wand and I was able to take away all of your suffering, what would you be doing? What is a goal that you can accomplish in the next day (or week) that can enable you to move towards what you value here and now?  Hooks: What thoughts (or feelings or sensations) get in the way of you being the person you want to be?  Away Moves: When suffering shows up, what do you do to get away  from it?        Patient reports she feels mentally okay right now. Endorses a headache from her ECT treatment yesterday and says they usually last for 3 days after the treatment. Says she has reportedly been told she couldn't take ibuprofen or Tylenol due to liver issues. Was just prescribed Flexeril muscle relaxer for headache/jaw pain and is waiting to pick it up from [facility]. Says ECT saved my life as she reports she was ready to end her life prior to these treatments. Says she is less emotional and less suspicious of others since starting ECT. Her ECT treatments had been decreased to every other week, but she requested last week that they continue weekly out of concern that her anxiety was worsening. Reports her anxiety has otherwise been good and says hydroxyzine is helpful. Complains of chronic tongue tic for several years and is interested in starting medication to manage it. Has previously tried lidocaine rinses and tongue mouth guards with little benefit. Denies SI/HI or AVH. Reports her therapy sessions with [name] have been going well and has helped her navigate difficult situations with her family members.    Objective:    Mental Status Exam:  Appearance:    Appears stated age, Well nourished and Clean/Neat, sitting in chair in front of screen   Motor:   No abnormal movements   Speech/Language:    Normal rate, volume, tone, fluency and Language intact, well formed   Mood:   good   Affect:   Full, mood congruent   Thought process and Associations:   Logical, linear, clear, coherent, goal directed   Abnormal/psychotic thought content:     Denies SI/HI   Perceptual disturbances:     Denies auditory and visual hallucinations, behavior not concerning for response to internal stimuli     Other:   -     {    Coding tips - Do not edit this text, it will delete upon signing of note!    ?? Telephone visits [phone] for Physicians and APP??s and [phone] for Non- Physician Clinicians)- Only use minutes on the phone to determine level of service.    ?? Video visits [phone]) - Use both minutes on video and pre/post minutes to determine level of service.       :[id number]}    The patient reports they are currently: at home. I spent 45 minutes on the real-time audio and video with the patient on the date of service. I spent an additional 45 minutes on pre- and post-visit activities on the date of service.     The patient was physically located in [place] or a state in which I am permitted to provide care. The patient and/or parent/guardian understood that s/he may incur co-pays and cost sharing, and agreed to the telemedicine visit. The visit was reasonable and appropriate under the circumstances given the patient's presentation at the time.    The patient and/or parent/guardian has been advised of the potential risks and limitations of this mode of treatment (including, but not limited to, the absence of in-person examination) and has agreed to be treated using telemedicine. The patient's/patient's family's questions regarding telemedicine have been answered.     If the visit was completed in an ambulatory setting, the patient and/or parent/guardian has also been advised to contact their provider???s office for worsening conditions, and seek emergency medical treatment and/or call 911 if the patient deems either necessary.

## 2021-11-08 NOTE — Unmapped (Signed)
[facility] Refill Coordination Note    Specialty Medication(s) to be Shipped:   Infectious Disease: Xifaxan    Other medication(s) to be shipped: No additional medications requested for fill at this time     Alexis Burns, DOB: [birth year]  Phone: [phone] (home)       All above HIPAA information was verified with patient.     Was a Nurse, learning disability used for this call? No    Completed refill call assessment today to schedule patient's medication shipment from the [facility] [phone]).  All relevant notes have been reviewed.     Specialty medication(s) and dose(s) confirmed: Regimen is correct and unchanged.   Changes to medications: [name] reports no changes at this time.  Changes to insurance: No  New side effects reported not previously addressed with a pharmacist or physician: None reported  Questions for the pharmacist: No    Confirmed patient received a Conservation officer, historic buildings and a Surveyor, mining with first shipment. The patient will receive a drug information handout for each medication shipped and additional FDA Medication Guides as required.       DISEASE/MEDICATION-SPECIFIC INFORMATION        N/A    SPECIALTY MEDICATION ADHERENCE     Medication Adherence    Patient reported X missed doses in the last month: 0  Specialty Medication: xifaxan 550mg   Patient is on additional specialty medications: No  Patient is on more than two specialty medications: No  Informant: patient  Reliability of informant: reliable  Reasons for non-adherence: no problems identified                                Were doses missed due to medication being on hold? No    XIFAXAN 550 mg Tab (rifAXIMin)  : 5 days of medicine on hand        REFERRAL TO PHARMACIST     Referral to the pharmacist: Not needed      [facility]     Shipping address confirmed in Epic.     Delivery Scheduled: Yes, Expected medication delivery date: 11/12/21.     Medication will be delivered via Same Day Courier to the prescription address in Epic [name].    [name]' W Wilhemena Durie Shared [facility] Specialty Technician

## 2021-11-11 NOTE — Unmapped (Addendum)
[facility]  Psychiatry   Established Patient E&M Service - Outpatient       Assessment:  Alexis Burns is a 56 y.o. female with a history of MDD and GAD who presents for follow-up evaluation.  Today she reports stable mood.  Primary stressor includes her concerns regarding her liver function and has been experiencing anxiety and poor sleep as a result of this. Reports therapy sessions with [name] to be beneficial. Reiterated that coping strategies learned in therapy, as opposed to medication adjustments, are a better strategy for managing acute distress outside of therapy appointments.  Of note, she reports that she tries at all costs to not get stressed which is contrary to the ACT model of acceptance and more consistent with emotional control.  She would likely benefit in future visits from a discussion around creative hopelessness and how working to avoid thoughts and feelings may not be helpful or consistent with her values.  She continues to receive ECT and has spoken with her ECT provider about spacing out the sessions to every 3 weeks due to frustrations with the side effects including headaches and memory loss.  Says hydroxyzine has been helpful for anxiety.  Reports clonidine was not helpful for tongue movements although did not experience side effects. Will plan to increase clonidine to target tics, sleep, and anxiety.  Counseled on risks of orthostatic hypotension.  Plan to follow up in approximately six weeks.  Patient expressed understanding and agrees with plan.    Identifying Information: Alexis Burns is an ADTC patient since 2019 with a history of major depressive disorder and generalized anxiety disorder, with past hospitalization in 2017 at [facility]. In addition, her history of emotional dysregulation, poor distress tolerance, suspicion of others, and splitting appears consistent with borderline personality traits. Past medication trials include Prozac (stopped working), risperidone (brief trial), lithium (intolerable side effects), buspirone (unhelpful at 20mg  TID), duloxetine (90mg  well-tolerated but not helpful on its own), aripiprazole (as adjunct for duloxetine, eventually stopped due to tremors), Wellbutrin (anxiety, dry mouth), Seroquel (ineffective), Cytomel (dry mouth, joint pain). Over several years until late 2020, she was fairly stable on duloxetine 90mg  and Abilify 5mg . She used to work in a Engineer, [employer] pre-pandemic in a stressful role, took time off for a leg injury, and then when she went back to work she was fired. This was destabilizing, and it was closely followed by tibial tendon surgery in and then suspected COVID infection. By Feb 2021, mood remained unstable, but Abilify was causing significant shakiness even at tapered doses, and she discontinued it. She had an exacerbation of depression in May 2021 with her son's mental health as a major stressor, and after intolerance of increasing Cymbalta to 120mg , ineffectiveness of Seroquel, intolerance of Wellbutrin, and time-limited benefit of risperidone, in September 2021 she was admitted to the [facility] for suicidal ideation with a plan to shoot herself. They started Cytomel but she did not tolerate it. She noticed some abnormal tongue movements while on Abilify and these worsened after Abilify discontinuation. We considered the possibility of TD, but neurology evaluation labeled the tongue movements as tic-like, and they also diagnosed her with essential tremor. She started to show signs of liver dysfunction on her bloodwork, and in December 2021 we made the switch from duloxetine to Prozac. This was tolerated well and effective for mood, and mood has varied with family stressors. Both insomnia and hypersomnia have been problems for Alexis Burns, and prns have been very helpful,  by spring 2022 sleep had improved with more physical activity. Alexis Burns was eventually restarted on Klonopin in Spring 2023 given severe anxiety/panic that caused her to be unable to leave her house for several weeks. Additionally, she experienced recurrence of depressive symptoms and passive suicidal ideation. Given how well she had done on an SNRI in the past, decision was made to cross taper Prozac to Pristiq. Given continued depressed mood, Alexis Burns was subsequently referred to interventional psychiatry ECT clinic in April 2023. Due to severity of symptoms, Alexis Burns was directly admitted to [facility] to start inpatient ECT and she was transitioned from Pristiq to sertraline. Klonopin was discontinued and Hydroxyzine PRN was started to target anxiety. She continued ECT as an outpatient.     Risk Assessment:  A suicide and violence risk assessment was performed at our last visit, and safety assessment remains uncharged: she is not at acute risk of harm to herself but is at chronic risk, and we will continue assessing safety at future evaluations.  Stressors: death of father in 01/06/2013 and mother in 2016-01-07, chronic depressive symptoms, financial stressors, unemployment, global pandemic and likely COVID19 infection, stressful relationships by son (improving).      Plan:  Problem #1: MDD - Borderline Personality Traits  Status: Chronic  Interventions:  - Continue sertraline 100 mg daily   - Patient receiving ECT with [facility], considering changing interval from qweek to q3weeks  - Continue weekly therapy with [name]    Problem #2: GAD:    Status of problem: chronic with acute exacerbation  - Increase hydroxyzine to 50 mg daily as needed for anxiety, as patient reports the increased dose to be more beneficial    Problem #3: Insomnia and excessive daytime sleepiness  Status of problem:  improved or improving with exercise  Interventions:   - Patient previously reported benefit in mood/fatigue from Vyvanse, but have discussed that given lack of childhood ADHD diagnosis and without sleep study we are not inclined to re-initiate this medication    Problem #4: Abnormal tongue movements  Status of problem: chronic  Interventions:  - Neurology labeled her tongue movements as tic-related, and suggested therapies to help. We had considered if this was a symptoms of tardive dyskinesia as it worsened after Abilify discontinuation which can happen. It originally diminished with stopping artifical sweeteners, but then came back. Neurology's assessment is reassuring  - Increase clonidine to 0.1 mg nightly (reported taking 0.05 mg for 1 week with minimal efficacy)    Problem: #5: Headache following ECT treatment  Status of problem:  chronic and stable  Interventions:  - Continue Flexeril 5 mg nightly PRN (prescribed by PCP)    Psychotherapy:  No billable psychotherapy service provided.    Patient has been given this writer's contact information as well as the [facility] Psych[facility] urgent line number. The patient has been instructed to call 911 for emergencies.    Patient and plan of care were discussed with the Attending MD, Dr. Derrill Kay, who agrees with the above statement and plan.    Jiles Crocker, MD  [facility] PGY2    Subjective:     Psychiatric Chief Concern:  Follow-up psychiatric evaluation for mood    Interval History:  Reports she is doing pretty good.  Says she took the RV to the beach last week on vacation.  Reports therapy sessions with [name] to be beneficial.  Says she tries at all costs not to get stressed.  She will avoid going out in public  which makes her stressed especially when she sees slow drivers.  Primary stressors are her current medical problems including her liver issues.  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  Says that she is tired of ECT and the headaches and memory loss side effects that she experiences.  She has spoken with her ECT provider about spacing out the sessions to every 3 weeks.  Says that her anxiety improves when she takes 50 mg of hydroxyzine instead of 25 mg.  His anxiety is worst at the end of the day when she is lying in bed.  Says hydroxyzine is also helpful for sleep.  Denies suicidal ideation.  Continues to experience abnormal tongue movements.  Tried the clonidine for approximately 1 week at 0.05 mg and it was not helpful.  Did not experience side effects on clonidine.    Objective:    Mental Status Exam:  Appearance:    Appears stated age, Well nourished and Clean/Neat, sitting in chair in front of screen   Motor:   No abnormal movements   Speech/Language:    Normal rate, volume, tone, fluency and Language intact, well formed   Mood:   pretty good   Affect:   Full, mood congruent   Thought process and Associations:   Logical, linear, clear, coherent, goal directed   Abnormal/psychotic thought content:     Denies SI/HI   Perceptual disturbances:     Behavior not concerning for response to internal stimuli     Other:   -          The patient reports they are currently: at home. I spent 35 minutes on the real-time audio and video with the patient on the date of service. I spent an additional 35 minutes on pre- and post-visit activities on the date of service.     The patient was physically located in [place] or a state in which I am permitted to provide care. The patient and/or parent/guardian understood that s/he may incur co-pays and cost sharing, and agreed to the telemedicine visit. The visit was reasonable and appropriate under the circumstances given the patient's presentation at the time.    The patient and/or parent/guardian has been advised of the potential risks and limitations of this mode of treatment (including, but not limited to, the absence of in-person examination) and has agreed to be treated using telemedicine. The patient's/patient's family's questions regarding telemedicine have been answered.     If the visit was completed in an ambulatory setting, the patient and/or parent/guardian has also been advised to contact their provider???s office for worsening conditions, and seek emergency medical treatment and/or call 911 if the patient deems either necessary.

## 2021-11-12 ENCOUNTER — Encounter: Admit: 2021-11-12 | Discharge: 2021-11-13 | Payer: PRIVATE HEALTH INSURANCE

## 2021-11-12 ENCOUNTER — Ambulatory Visit: Admit: 2021-11-12 | Discharge: 2021-11-13 | Payer: PRIVATE HEALTH INSURANCE

## 2021-11-12 MED ADMIN — promethazine (PHENERGAN) injection: INTRAVENOUS | @ 15:00:00 | Stop: 2021-11-12

## 2021-11-12 MED ADMIN — labetalol (NORMODYNE) injection: INTRAVENOUS | @ 15:00:00 | Stop: 2021-11-12

## 2021-11-12 MED ADMIN — ketamine (KETALAR) injection: INTRAVENOUS | @ 15:00:00 | Stop: 2021-11-12

## 2021-11-12 MED ADMIN — ketorolac (TORADOL) injection: INTRAVENOUS | @ 15:00:00 | Stop: 2021-11-12

## 2021-11-12 MED ADMIN — caffeine citrate (CAFCIT) injection: INTRAVENOUS | @ 15:00:00 | Stop: 2021-11-12

## 2021-11-12 MED ADMIN — ondansetron (ZOFRAN) injection: INTRAVENOUS | @ 15:00:00 | Stop: 2021-11-12

## 2021-11-12 MED ADMIN — propofoL (DIPRIVAN) injection: INTRAVENOUS | @ 15:00:00 | Stop: 2021-11-12

## 2021-11-12 MED ADMIN — succinylcholine (ANECTINE) injection: INTRAVENOUS | @ 15:00:00 | Stop: 2021-11-12

## 2021-11-12 MED FILL — XIFAXAN 550 MG TABLET: ORAL | 30 days supply | Qty: 60 | Fill #4

## 2021-11-12 NOTE — Unmapped (Addendum)
[facility]               Procedure Note                                           Requesting Attending Physician: Not In System Provider  Admit Date: 11/12/2021     Service Type: Inpatient  Requesting Attending Physician: Not In System Provider  Service requesting consult: Psychiatry  Consulting service: Psychiatry  Admit Date: 11/12/2021  Service Date: November 12, 2021    Time out was taken with staff to confirm correct patient and correct procedure to be performed.    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 Much improved    Beck Depression Inventory Total Score:     Baseline: 51  Most recent:  14    Mini-Mental Status ExamTotal Score:     Baseline: 25  Most recent: 30    MEDICAL INFORMATION:    Allergies: Haldol [haloperidol lactate], Tylenol [acetaminophen], Cyclosporine, Tetracycline, and Tramadol    Medical History: See ECT Consult    Surgical History: See ECT Consult    VITAL SIGNS:      Pre-procedure:  167/97  89      Post-stimulus:     Vitals:    11/12/21 1100 11/12/21 1103 11/12/21 1105 11/12/21 1106   BP: 158/97 162/99     Pulse: 109 91 100 99   Resp: 19 21 16 18    Temp:       TempSrc:       SpO2: 93% 90% 91% 92%         THYMATRON:  ECT # Thymatron Settings (%) Modification Seizure   Length Cuff Duration  (sec) EEG Duration  (sec)   1 100 Mild Adequate 29 54       Medications given during procedure:   Current Outpatient Medications   Medication Sig Dispense Refill   ??? cloNIDine HCL (CATAPRES) 0.1 MG tablet Start 1/2 tablet nightly for 6 days then can increase to 1/2 tablet twice daily as tolerated. Monitor for sedation and lightheadedness. Please wait to take this medication until after having taken Flexeril for more than 1 week. 60 tablet 0   ??? cyclobenzaprine (FLEXERIL) 5 MG tablet Take 1 tablet (5 mg total) by mouth nightly as needed for muscle spasms. 30 tablet 0   ??? hydrOXYzine (ATARAX) 25 MG tablet Take 1 tablet (25 mg total) by mouth daily as needed for anxiety. 30 tablet 2   ??? sertraline (ZOLOFT) 100 MG tablet Take 1 tablet (100 mg total) by mouth daily. 30 tablet 1   ??? XIFAXAN 550 mg Tab Take 1 tablet (550 mg total) by mouth Two (2) times a day. 180 tablet 4     Current Facility-Administered Medications   Medication Dose Route Frequency Provider Last Rate Last Admin   ??? acetaminophen (TYLENOL) tablet 1,000 mg  1,000 mg Oral Once PRN Mardene Celeste, MD       ??? albuterol (PROVENTIL HFA;VENTOLIN HFA) 90 mcg/actuation inhaler 2 puff  2 puff Inhalation Once PRN Mardene Celeste, MD       ???  albuterol 2.5 mg /3 mL (0.083 %) nebulizer solution 2.5 mg  2.5 mg Nebulization Once PRN Mardene Celeste, MD       ??? fentaNYL (PF) (SUBLIMAZE) injection 25 mcg  25 mcg Intravenous Q5 Min PRN Mardene Celeste, MD       ??? HYDROmorphone (injection) injection 0.5 mg  0.5 mg Intravenous Q5 Min PRN Mardene Celeste, MD       ??? labetalol (NORMODYNE) injection  2.5 mg Intravenous Q5 Min PRN Mardene Celeste, MD       ??? meperidine (DEMEROL) injection 12.5 mg  12.5 mg Intravenous Q5 Min PRN Mardene Celeste, MD       ??? metoprolol (LOPRESSOR) injection 1 mg  1 mg Intravenous Q5 Min PRN Mardene Celeste, MD       ??? ondansetron [facility]) injection 4 mg  4 mg Intravenous Once PRN Mardene Celeste, MD         Facility-Administered Medications Ordered in Other Encounters   Medication Dose Route Frequency Provider Last Rate Last Admin   ??? caffeine citrate (CAFCIT) injection   Intravenous PRN (once a day) Charissa Bash, MD   120 mg at 11/12/21 1035   ??? ketamine (KETALAR) injection   Intravenous PRN (once a day) Charissa Bash, MD   60 mg at 11/12/21 1035   ??? ondansetron (ZOFRAN) injection   Intravenous PRN (once a day) Charissa Bash, MD   4 mg at 11/12/21 1033   ??? propofoL (DIPRIVAN) injection   Intravenous PRN (once a day) Charissa Bash, MD   80 mg at 11/12/21 1035        labetelol 5 mg IV    Neck Collar Used:  Yes     PROGRESS NOTE:     Alexis Burns is a 56 y.o. female who presents for outpatient continuation ECT. Treatments started in the inpatient setting, and she was discharged home on 08/03/2021.  Patient report good mood without prolonged periods of depression.  Anhedonia has improved and she enjoyed a week at the beach with her family.  Sleep is poor due to elevated anxiety.  Energy and motivation are very good.   She denies SI, medication changes or new medical problems.    Mental Status Exam:  Appearance:    Well nourished and Well developed   Behavior:  Calm and Cooperative   Motor:   No abnormal movements   Speech/Language:    Normal rate, volume, tone, fluency and Language intact, well formed   Mood:   good   Affect:   Calm, euthymic, cooperative, bright   Thought process:   Logical, linear, clear, coherent, goal directed   Thought content:    Denies suicidal ideation   Perceptual disturbances:     Denies auditory and visual hallucinations, behavior not concerning for response to internal stimuli   Orientation:   Oriented to person, place, time, and general circumstances   Attention:   Able to fully attend without fluctuations in consciousness   Concentration:   Able to fully concentrate and attend     Physical Exam:   Gen: NAD  Pulm: No increased work of breathing  Neuro: No tremors, tics, or abnormal movements    Assessment and Plan:   Mood Disorder: Maj Depress D/O, 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     Seizure was of adequate length, fair amplitude, fair morphology, and poor post-ictal suppression.    Interventions Today:    ??? Medication : caffeine  120mg , labetelol  5 mg IV  ??? Energy adjustments: none    Post-Treatment Complications: Hypertension treated with labetalol 5mg      Recommendations for Future Treatments:    ??? Medication adjustments:  none  ??? Energy adjustments: Increased energy to 100%    ??? Frequency: return in  2 weeks  then increase as tolerated    Next Treatment Date: 11/26/21    Meds to take prior to ECT: None    Special Instructions: none    Assisted by: None    Willey Blade, MD

## 2021-11-13 ENCOUNTER — Telehealth: Admit: 2021-11-13 | Discharge: 2021-11-14 | Payer: PRIVATE HEALTH INSURANCE

## 2021-11-13 DIAGNOSIS — F959 Tic disorder, unspecified: Principal | ICD-10-CM

## 2021-11-13 DIAGNOSIS — F32A Depression, unspecified depression type: Principal | ICD-10-CM

## 2021-11-13 DIAGNOSIS — F411 Generalized anxiety disorder: Principal | ICD-10-CM

## 2021-11-13 MED ORDER — SERTRALINE 100 MG TABLET
ORAL_TABLET | Freq: Every day | ORAL | 2 refills | 30 days | Status: CP
Start: 2021-11-13 — End: 2022-02-11

## 2021-11-13 MED ORDER — CLONIDINE HCL 0.1 MG TABLET
ORAL_TABLET | Freq: Every evening | ORAL | 2 refills | 30 days | Status: CP
Start: 2021-11-13 — End: ?

## 2021-11-13 MED ORDER — HYDROXYZINE HCL 25 MG TABLET
ORAL_TABLET | Freq: Every day | ORAL | 2 refills | 30 days | Status: CP | PRN
Start: 2021-11-13 — End: ?

## 2021-11-13 NOTE — Unmapped (Signed)
Thank-you for visiting [facility] clinic. The following changes were made to your medications:  - Increase hydroxyzine to 50 mg daily as needed for anxiety  - Start clonidine 0.1 mg nightly, monitor for dizziness with standing    Your follow up appointment will be scheduled for 10:45am on 01/01/2022.    Follow-up instructions:  -- Please continue taking your medications as prescribed for your mental health.   -- Do not make changes to your medications, including taking more or less than prescribed, unless under the supervision of your physician. Be aware that some medications may make you feel worse if abruptly stopped  -- Please refrain from using illicit substances, as these can affect your mood and could cause anxiety or other concerning symptoms.   -- Seek further medical care for any increase in symptoms or new symptoms such as thoughts of wanting to hurt yourself or hurt others.     Contact info:  Life-threatening emergencies: Call 911, the 988 suicide and crisis lifeline, or go to the nearest ER for medical or psychiatric attention.     Issues that need urgent attention but are not life threatening: Call the clinic outpatient front desk for assistance. [phone] for our [facility] clinic located at [address] [facility]. [phone] for our [facility] STEP clinic.    Non-urgent routine concerns and questions: Send a message through [facility] or call our clinic front desk.    Refill requests: Check with your pharmacy to initiate refill requests.    Regarding appointments:  - If you need to cancel your appointment, we ask that you call your clinic at least 24 hours before your scheduled appointment. [phone] for our [facility] clinic located at [address] [facility]. [phone] for our [facility] STEP clinic.  - If for any reason you arrive 15 minutes later than your scheduled appointment time, you may not be seen and your visit may be rescheduled.  - Please remember that we will not automatically reschedule missed appointments.  - If you miss two (2) appointments without letting us know in advance, you will likely be referred to a provider in your community.  - We will do our best to be on time. Sometimes an emergency will arise that might cause your clinician to be late. We will try to inform you of this when you check in for your appointment. If you wait more than 15 minutes past your appointment time without such notice, please speak with the front desk staff.    In the event of bad weather, the clinic staff will attempt to contact you, should your appointment need to be rescheduled. Additionally, you can call the Patient Weather Line [phone] for system-wide clinic status    For more information and reminders regarding clinic policies (these were provided when you were admitted to the clinic), please ask the front desk.

## 2021-11-14 NOTE — Unmapped (Signed)
I was available. I discussed the case with the resident at length. I reviewed the resident???s note and edited where appropriate. I agree with the resident???s findings and plan.     Russ Halo, MD

## 2021-11-15 ENCOUNTER — Telehealth: Admit: 2021-11-15 | Discharge: 2021-11-16 | Payer: PRIVATE HEALTH INSURANCE | Attending: Clinical | Primary: Clinical

## 2021-11-15 DIAGNOSIS — F332 Major depressive disorder, recurrent severe without psychotic features: Principal | ICD-10-CM

## 2021-11-15 DIAGNOSIS — F419 Anxiety disorder, unspecified: Principal | ICD-10-CM

## 2021-11-15 NOTE — Unmapped (Signed)
I spent 57 minutes on video with the patient. I spent an additional 0 minutes on pre- and post-visit activities.  The patient was physically located in [place] or a state in which I am permitted to provide care. The patient understood that s/he may incur co-pays and cost sharing and agreed to the telemedicine visit. The visit was completed via phone and/or video, which was appropriate and reasonable under the circumstances given the patient's presentation at the time.     The patient has been advised of the potential risks and limitations of this mode of treatment (including, but not limited to, the absence of in-person examination) and has agreed to be treated using telemedicine. The patient's/patient's family's questions regarding telemedicine have been answered.      If the phone/video visit was completed in an ambulatory setting, the patient has also been advised to contact their provider???s office for worsening conditions and seek emergency medical treatment and/or call 911 if the patient deems necessary.        [facility]       STEP Patient Psychotherapy    Name: Alexis Burns  Date: 11/15/2021  MRN: [id number]  DOB: April 09, 1965  PCP: Jeannine Boga, FNP    Service Duration:  57 minutes        Service: Outpatient Therapy- Individual   [x]  Video     Date of Last Encounter:  Visit date not found    Mental Status/Behavioral Observations  Affect:  Mood congruent   Mood:   anxious   Thought Process:  Goal directed and Linear   Behavior:   Cooperative and Direct eye contact   Self Harm: none and future oriented     Purpose of contact:    []   Continue to address treatment goals  [x]   Treatment Planning/Treatment progress review []  Discharge Planning     Interventions Provided:     [x]   CBT  []   Interpersonal Process Therapy []  Acceptance & Commitment Therapy (ACT)  []  DBT  []  Motivational Interviewing  []  Behavioral Activation                               []  Psycho-Education  []  Exposure Therapy  []  Trauma-Informed CBT [x]  Person Centered  [x]  Supportive Therapy    Patient Response/Progress:  Identifying Information: Alexis Burns is an ADTC patient since 2019 with a history of depression and anxiety, with past hospitalization in 2017 at [facility]. Over several years until late 2020, she was fairly stable on duloxetine 90mg  and Abilify 5mg . She used to work in a [employer], [employer] pre-pandemic in a stressful role, took time off for a leg injury, and then when she went back to work she was fired. This was destabilizing, and it was closely followed by tibial tendon surgery in and then suspected COVID infection. By Feb 2021, mood remained unstable. She had an exacerbation of depression in May 2021 with her son's mental health as a major stressor, and after intolerance of increasing Cymbalta to 120mg , ineffectiveness of Seroquel, intolerance of Wellbutrin, and time-limited benefit of risperidone.In 2021 she was admitted to the [facility] Unit for suicidal ideation with a plan to shoot herself. Medication was tolerated well and effective for mood, and mood has varied with family stressors. Both insomnia and hypersomnia have been problems for Alexis Burns, and prns have been variably helpful, by spring 2022 sleep had improved with more  physical activity.  11/15/21 POC Target Outcomes:reduction in symptoms, including I can't turn my brain off, depression, anxiety, and aggravation/anger, increased independent functioning, specifically not being spoiled and dependent on her husband, reduction in aggresive ideation, employment, improved community integration, including avoiding isolation, and stop focusing on the negative.and stop taking things personally focus on good health.  Treatment/Process:   Patient joins today by video for individual therapy. This session was conducted as a clinical therapy visit to assess acute safety, monitor ongoing clinical treatment plan, and provide continuity to outpatient treatment in the setting of Covid19 Pandemic.  Patient appropriately engaged in the discussion and was able to follow the course of the conversation; presenting anxious, without indication of psychosis, cooperatively, and future oriented.   Clinician facilitated discussion of pt status, concerns, and /review/development of POC with session content including:    01/10/21 POC Target Outcomes Review  Reduction in symptoms, including:  1) I can't turn my brain off not as bad as it was far from fixed  2) Depression: I'm in a fairly good mood have been for several days not much gets me down anymore  3) Anxiety: depression is better so my train of thought is better so I can reason better  4) Aggravation/anger: not bubbling through ECT has done wonders a winning combination with Zoloft   5) Increased independent functioning, specifically not being the world's worst wife resulting in being lazy and dependent on her husband: not as lazy but pretty dog gone spoiled very spoiled mostly with [name] mostly in the kitchen. Pt reflects on I feel guilty about it.  6) Stop focusing on the negative.and stop taking things personally: much improvement; given the diagnosis of the liver disease perspectives have changed.      11/15/21 POC                                                                              [facility]                                                                                    Psychiatry                                                                Individualized Treatment Plan of Care      Name: Alexis Burns  Date: 11/15/2021  MRN: [id number]  DOB: Jun 27, 1965  PCP: Jeannine Boga, FNP      Service provided: individual therapy     Target outcomes: reduction in symptoms, including I can't turn my brain off, depression, anxiety, and aggravation/anger, increased independent functioning, specifically not being spoiled and dependent on her husband,  reduction in aggresive ideation, employment, improved community integration, including avoiding isolation, and stop focusing on the negative.and stop taking things personally focus on good health.     Projected date of outcome achievement (6-12 months): 11/16/22     Strategies and interventions: individual insight oriented psychotherapy, individual cognitive behavioral therapy, individual motivational enhancement therapy and individual interpersonal psychotherapy     Estimated date of next review (6 months): 05/16/22    Individual signature: ______Pt unable to sign but gives verbal approval ___  Printed name: _______Jeri [employer]_______    Date: _____3/14/24_______    Provider signature: _______MRM_________    Printed name: Debroah Loop, LCSW    Date: ____9/14/24______    RISK ASSESSMENT: A suicide and violence risk assessment was performed as part of this evaluation.  While future psychiatric events cannot be accurately predicted, the patient does not currently require acute inpatient psychiatric care and does not currently meet [facility] involuntary commitment criteria.      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

## 2021-11-17 LAB — CBC W/ AUTO DIFF
BASOPHILS ABSOLUTE COUNT: 0.1 10*9/L (ref 0.0–0.1)
BASOPHILS RELATIVE PERCENT: 1.1 %
EOSINOPHILS ABSOLUTE COUNT: 0.2 10*9/L (ref 0.0–0.5)
EOSINOPHILS RELATIVE PERCENT: 3.9 %
HEMATOCRIT: 41.2 % (ref 34.0–44.0)
HEMOGLOBIN: 13.6 g/dL (ref 11.3–14.9)
LYMPHOCYTES ABSOLUTE COUNT: 2.3 10*9/L (ref 1.1–3.6)
LYMPHOCYTES RELATIVE PERCENT: 40.4 %
MEAN CORPUSCULAR HEMOGLOBIN CONC: 33 g/dL (ref 32.0–36.0)
MEAN CORPUSCULAR HEMOGLOBIN: 28.7 pg (ref 25.9–32.4)
MEAN CORPUSCULAR VOLUME: 87 fL (ref 77.6–95.7)
MEAN PLATELET VOLUME: 8.3 fL (ref 6.8–10.7)
MONOCYTES ABSOLUTE COUNT: 0.5 10*9/L (ref 0.3–0.8)
MONOCYTES RELATIVE PERCENT: 8.7 %
NEUTROPHILS ABSOLUTE COUNT: 2.6 10*9/L (ref 1.8–7.8)
NEUTROPHILS RELATIVE PERCENT: 45.9 %
NUCLEATED RED BLOOD CELLS: 0 /100{WBCs} (ref <=4)
PLATELET COUNT: 90 10*9/L — ABNORMAL LOW (ref 150–450)
RED BLOOD CELL COUNT: 4.74 10*12/L (ref 3.95–5.13)
RED CELL DISTRIBUTION WIDTH: 14.4 % (ref 12.2–15.2)
WBC ADJUSTED: 5.6 10*9/L (ref 3.6–11.2)

## 2021-11-17 LAB — COMPREHENSIVE METABOLIC PANEL
ALBUMIN: 3.6 g/dL (ref 3.4–5.0)
ALKALINE PHOSPHATASE: 85 U/L (ref 46–116)
ALT (SGPT): 33 U/L (ref 10–49)
ANION GAP: 6 mmol/L (ref 5–14)
AST (SGOT): 32 U/L (ref <=34)
BILIRUBIN TOTAL: 0.7 mg/dL (ref 0.3–1.2)
BLOOD UREA NITROGEN: 12 mg/dL (ref 9–23)
BUN / CREAT RATIO: 14
CALCIUM: 9.3 mg/dL (ref 8.7–10.4)
CHLORIDE: 110 mmol/L — ABNORMAL HIGH (ref 98–107)
CO2: 27 mmol/L (ref 20.0–31.0)
CREATININE: 0.87 mg/dL — ABNORMAL HIGH
EGFR CKD-EPI (2021) FEMALE: 79 mL/min/{1.73_m2} (ref >=60)
GLUCOSE RANDOM: 80 mg/dL (ref 70–179)
POTASSIUM: 3.9 mmol/L (ref 3.4–4.8)
PROTEIN TOTAL: 6.2 g/dL (ref 5.7–8.2)
SODIUM: 143 mmol/L (ref 135–145)

## 2021-11-17 LAB — LIPASE: LIPASE: 92 U/L — ABNORMAL HIGH (ref 12–53)

## 2021-11-18 ENCOUNTER — Emergency Department
Admit: 2021-11-18 | Discharge: 2021-11-18 | Disposition: A | Discharge status: Home / Self Care | Payer: PRIVATE HEALTH INSURANCE

## 2021-11-18 ENCOUNTER — Ambulatory Visit
Admit: 2021-11-18 | Discharge: 2021-11-18 | Disposition: A | Discharge status: Home / Self Care | Payer: PRIVATE HEALTH INSURANCE

## 2021-11-18 DIAGNOSIS — B372 Candidiasis of skin and nail: Principal | ICD-10-CM

## 2021-11-18 LAB — URINALYSIS WITH MICROSCOPY WITH CULTURE REFLEX
BACTERIA: NONE SEEN /HPF
BILIRUBIN UA: NEGATIVE
BLOOD UA: NEGATIVE
GLUCOSE UA: NEGATIVE
KETONES UA: NEGATIVE
LEUKOCYTE ESTERASE UA: NEGATIVE
NITRITE UA: NEGATIVE
PH UA: 7 (ref 5.0–9.0)
PROTEIN UA: NEGATIVE
RBC UA: <1 /HPF (ref <=4)
SPECIFIC GRAVITY UA: 1.033 — ABNORMAL HIGH (ref 1.003–1.030)
SQUAMOUS EPITHELIAL: 2 /HPF (ref 0–5)
UROBILINOGEN UA: <2
WBC UA: 1 /HPF (ref 0–5)

## 2021-11-18 MED ORDER — OXYCODONE 5 MG TABLET
ORAL_TABLET | ORAL | 0 refills | 2.00000 days | Status: CP | PRN
Start: 2021-11-18 — End: 2021-11-23

## 2021-11-18 MED ORDER — ONDANSETRON 4 MG DISINTEGRATING TABLET
ORAL_TABLET | Freq: Three times a day (TID) | ORAL | 0 refills | 5 days | Status: CP | PRN
Start: 2021-11-18 — End: 2021-11-25

## 2021-11-18 MED ORDER — NYSTOP 100,000 UNIT/GRAM TOPICAL POWDER
0 refills | 0 days
Start: 2021-11-18 — End: ?

## 2021-11-18 MED ORDER — TAMSULOSIN 0.4 MG CAPSULE
ORAL_CAPSULE | Freq: Every day | ORAL | 0 refills | 10 days | Status: CP
Start: 2021-11-18 — End: 2021-11-28

## 2021-11-18 MED ADMIN — ondansetron (ZOFRAN) injection 4 mg: 4 mg | INTRAVENOUS | @ 02:00:00 | Stop: 2021-11-17

## 2021-11-18 MED ADMIN — sodium chloride 0.9% (NS) bolus 1,000 mL: 1000 mL | INTRAVENOUS | @ 05:00:00 | Stop: 2021-11-18

## 2021-11-18 MED ADMIN — ketorolac (TORADOL) injection 15 mg: 15 mg | INTRAVENOUS | @ 05:00:00 | Stop: 2021-11-18

## 2021-11-18 MED ADMIN — iohexoL (OMNIPAQUE) 350 mg iodine/mL solution 100 mL: 100 mL | INTRAVENOUS | @ 04:00:00 | Stop: 2021-11-17

## 2021-11-18 NOTE — Unmapped (Signed)
Pt endorsing vaginal cramping that progressed to pain to the lower right abdomen for a couple day with bloody stools and vomiting. Denies fever, endorses chills.

## 2021-11-18 NOTE — Unmapped (Addendum)
[facility]  Emergency Department Provider Note     ED Clinical Impression     Final diagnoses:   Nephrolithiasis (Primary)      Impression, Medical Decision Making, ED Course     Impression: 56 y.o. female who has a past medical history of ADD (attention deficit disorder), Anxiety (Since childhood), Arthritis (10 years), Asthma, Binge eating disorder, Cirrhosis (CMS-HCC), Depression (04/11/2016), Hyperlipidemia (20 years), Hypertension, and Obesity (Since childhood). And hepatic cirrhosis who presents with cute onset of intermittent vaginal cramping and 7/10 constant lower right abdominal pain that radiates upwards and around to her right rib cage that started earlier this afternoon as described below.     Most recent vitals generally reassuring. On exam, patient is well appearing, no CVA tenderness.     DDx/MDM: At time of initial evaluation CT with evidence of 3 mm nephrolithiasis with small amount of mild hydronephrosis. Lab work generally unremarkable and non-actionable. UA without evidence of infection. Pain well controlled.     Diagnostic workup as below.    Orders Placed This Encounter   Procedures    CT Abdomen Pelvis W IV Contrast Only    CBC w/ Differential    Comprehensive Metabolic Panel    Lipase Level    Urinalysis with Microscopy with Culture Reflex    Insert peripheral IV     ED Course as of 11/18/21 1653   Sun Nov 18, 2021   0059 CT Abdomen Pelvis W IV Contrast Only  IMPRESSION:     1. Bilateral nephrolithiasis with mild right-sided hydronephrosis and hydroureter with a 3 mm calculus at the right UVJ. There is mild perivascular stranding which can be seen with cystitis. Correlation with urinalysis is recommended.     2. Hepatic cirrhosis with stigmata of portal hypertension including splenic vein and varices.     3. Additional findings, as described above.     0059 Lipase(!): 92   0059 Comprehensive Metabolic Panel(!):    Sodium 143   Potassium 3.9   Chloride 110(!)   CO2 27.0   Anion Gap 6   Bun 12   Creatinine 0.87(!)   BUN/Creatinine Ratio 14   eGFR CKD-EPI (2021) Female 79   Glucose 80   Calcium 9.3   Albumin 3.6   Total Protein 6.2   Total Bilirubin 0.7   SGOT (AST) 32   ALT 33   Alkaline Phosphatase 85  Creatinine 0.87   0059 CBC w/ Differential(!):    WBC 5.6   RBC 4.74   HGB 13.6   HCT 41.2   MCV 87.0   MCH 28.7   MCHC 33.0   RDW 14.4   MPV 8.3   Platelet 90(!)   nRBC 0   Neutrophils % 45.9   Lymphocytes % 40.4   Monocytes % 8.7   Eosinophils % 3.9   Basophils % 1.1   Absolute Neutrophils 2.6   Absolute Lymphocytes 2.3   Absolute Monocytes  0.5   Absolute Eosinophils 0.2   Absolute Basophils  0.1  Reassuringly WBC 5.6   0155 Urinalysis with Microscopy with Culture Reflex(!):    Color, UA Colorless   Clarity, UA Clear   Spec Grav, UA 1.033(!)   pH, UA 7.0   Leukocyte Esterase, UA Negative   Nitrite, UA Negative   Protein, UA Negative   Glucose, UA Negative   Ketones, UA Negative   Urobilinogen, UA <2.0 mg/dL   Bilirubin, UA Negative   Blood, UA Negative   RBC, UA <1  WBC, UA 1   Squam Epithel, UA 2   Bacteria, UA None Seen   Mucus, UA Rare(!)  UA notable for mildly elevated specific gravity, no signs of infection.   1610 Patient is stable for discharge.  3mm stone with mild hydronephrosis without evidence of obstruction or infection. Discussed discharge instructions and precautions fully with patient expressed understanding.  Patient's pain is tolerable at this time.     Independent Interpretation of Studies: I have independently interpreted the following studies:  See ED course    Discussion of Management With Other Providers or Support Staff: I discussed the management of this patient with the:  None    Considerations Regarding Disposition/Escalation of Care and Critical Care:  No indication for admission as pain is controlled  ____________________________________________    The case was discussed with the attending physician, who is in agreement with the above assessment and plan.      History Chief Complaint  Chief Complaint   Patient presents with    Abdominal Pain       HPI   Alexis Burns is a 56 y.o. female with past medical history of OSA, mild cognitive impairment, and as below who presents with abdominal pain. Patient reports acute onset of 7/10 constant lower right abdominal pain that radiates upwards and around to her right rib cage that started earlier this afternoon. She also endorses nausea and emesis en route to the ED. She reports that she has never experienced pain like this before. Of note she was recently diagnosed with hepatic cirrhosis and takes Xifaxin and lactulose. Denies recent changes in medication or diet. Denies dysuria, hematuria, shortness of breath, fever, chest pain, or abnormal bowel movements.    Outside Historian(s): I have obtained additional history/collateral from family at bedside.    External Records Reviewed: I have reviewed 11/15/2021 Psychiatry Telemedicine Note for further medical history.    Past Medical History:   Diagnosis Date    ADD (attention deficit disorder)     Anxiety Since childhood    Arthritis 10 years    Asthma     cough variant asthma per pt report    Binge eating disorder     Cirrhosis (CMS-HCC)     Depression 04/11/2016    Hyperlipidemia 20 years    Not interested in meds    Hypertension     Obesity Since childhood       Past Surgical History:   Procedure Laterality Date    BREAST BIOPSY Left     many biopsies    BREAST CYST ASPIRATION Right 2003    abcess    BREAST EXCISIONAL BIOPSY Left 2014ish    CESAREAN SECTION      CHOLECYSTECTOMY      HERNIA REPAIR  2020    HYSTERECTOMY  2001    partial    OOPHORECTOMY      still has one ovary    PR COLONOSCOPY W/BIOPSY SINGLE/MULTIPLE N/A 04/19/2020    Procedure: COLONOSCOPY, FLEXIBLE, PROXIMAL TO SPLENIC FLEXURE; WITH BIOPSY, SINGLE OR MULTIPLE;  Surgeon: Luanne Bras, MD;  [facility];  Service: Gastroenterology    PR REPAIR INCISIONAL HERNIA,STRANG Midline 09/25/2018    Procedure: REPAIR INITIAL INCISIONAL OR VENTRAL HERNIA; INCARCERATED OR STRANGULATED;  Surgeon: Colon Branch, MD;  Location: [facility];  Service: General Surgery    PR SHLDR ARTHROSCOP,SURG,W/ROTAT CUFF REPR Left 09/25/2020    Procedure: ARTHROSCOPY, SHOULDER, SURGICAL; WITH ROTATOR CUFF REPAIR;  Surgeon: Meyer Cory  [place] Crane, MD;  Location: [facility];  Service: Orthopedics    PR UPPER GI ENDOSCOPY,DIAGNOSIS N/A 04/19/2020    Procedure: UGI ENDO, INCLUDE ESOPHAGUS, STOMACH, & DUODENUM &/OR JEJUNUM; DX W/WO COLLECTION SPECIMN, BY BRUSH OR WASH;  Surgeon: Luanne Bras, MD;  Location: [facility];  Service: Gastroenterology    WISDOM TOOTH EXTRACTION         No current facility-administered medications for this encounter.    Current Outpatient Medications:     cloNIDine HCL (CATAPRES) 0.1 MG tablet, Take 1 tablet (0.1 mg total) by mouth nightly. Monitor for dizziness with standing., Disp: 30 tablet, Rfl: 2    cyclobenzaprine (FLEXERIL) 5 MG tablet, Take 1 tablet (5 mg total) by mouth nightly as needed for muscle spasms., Disp: 30 tablet, Rfl: 0    hydrOXYzine (ATARAX) 25 MG tablet, Take 2 tablets (50 mg total) by mouth daily as needed for anxiety., Disp: 60 tablet, Rfl: 2    ondansetron (ZOFRAN-ODT) 4 MG disintegrating tablet, Take 1 tablet (4 mg total) by mouth every eight (8) hours as needed for nausea for up to 7 days., Disp: 14 tablet, Rfl: 0    oxyCODONE (ROXICODONE) 5 MG immediate release tablet, Take 1 tablet (5 mg total) by mouth every four (4) hours as needed for pain for up to 5 days., Disp: 10 tablet, Rfl: 0    oxyCODONE (ROXICODONE) 5 MG immediate release tablet, Take 1 tablet (5 mg total) by mouth every four (4) hours as needed for pain for up to 5 days., Disp: 10 tablet, Rfl: 0    sertraline (ZOLOFT) 100 MG tablet, Take 1 tablet (100 mg total) by mouth daily., Disp: 30 tablet, Rfl: 2    tamsulosin (FLOMAX) 0.4 mg capsule, Take 1 capsule (0.4 mg total) by mouth daily for 10 days., Disp: 10 capsule, Rfl: 0    XIFAXAN 550 mg Tab, Take 1 tablet (550 mg total) by mouth Two (2) times a day., Disp: 180 tablet, Rfl: 4    Allergies  Haldol [haloperidol lactate], Tylenol [acetaminophen], Cyclosporine, Tetracycline, and Tramadol    Family History  Family History   Problem Relation Age of Onset    Lymphoma Mother     Diabetes Mother     Hypertension Mother     Skin cancer Mother     Arthritis Mother     Cancer Mother         lymph    Kidney disease Mother     Vision loss Mother         Macular D.    Parkinsonism Father     Hypertension Father     Colon cancer Father 10    Macular degeneration Father     Alcohol abuse Father     Cancer Father         colon    Depression Father     Vision loss Father         Macular D.    Parkinsonism Sister     Tremor Sister         essential tremor, hands and voice    Seizures Sister     Migraines Sister     Macular degeneration Sister     Miscarriages / [place] Sister     Vision loss Sister         Macular d    Skin cancer Sister     Tremor Sister         no diagnosis  Migraines Sister     Macular degeneration Sister     Vision loss Sister         Macular d    Skin cancer Maternal Grandmother     Stroke Maternal Grandmother     Alcohol abuse Paternal Grandmother     Heart disease Paternal Grandfather     Cancer Son         Spinal cancer, leaving pt disabled    Diabetes Son     Mental illness Son     Neuropathy Son     Depression Son     Drug abuse Son     Hypertension Son     Kidney disease Son     Vision loss Son         Diabetes    Mental illness Son     Intellectual Disability Son     Learning disabilities Son        Social History  Social History     Tobacco Use    Smoking status: Former     Packs/day: 1.00     Years: 15.00     Additional pack years: 0.00     Total pack years: 15.00     Types: Cigarettes     Quit date: 04/25/1994     Years since quitting: 27.5    Smokeless tobacco: Never   Vaping Use    Vaping Use: Former Substance Use Topics    Alcohol use: Not Currently    Drug use: Never        Physical Exam     VITAL SIGNS:      Vitals:    11/17/21 2151 11/18/21 0145   BP: 134/88 128/74   Pulse: 75 77   Resp: 20 16   Temp: 36.9 ??C (98.4 ??F)    TempSrc: Oral    SpO2: 100% 98%   Weight: (!) 104.3 kg (230 lb)    Height: 167.6 cm (5' 6)      Constitutional: Alert and oriented. No acute distress.  Eyes: Conjunctivae are normal.  HEENT: Normocephalic and atraumatic. Conjunctivae clear. No congestion. Moist mucous membranes.   Cardiovascular: Rate as above, regular rhythm. Normal and symmetric distal pulses. Brisk capillary refill. Normal skin turgor.  Respiratory: Normal respiratory effort. Breath sounds are normal. There are no wheezing or crackles heard.  Gastrointestinal: Soft, non-distended, non-tender. No CVA tenderness.   Genitourinary: Deferred.  Musculoskeletal: Non-tender with normal range of motion in all extremities.  Neurologic: Normal speech and language. No gross focal neurologic deficits are appreciated. Patient is moving all extremities equally, face is symmetric at rest and with speech.  Skin: Skin is warm, dry and intact. No rash noted.  Psychiatric: Mood and affect are normal. Speech and behavior are normal.     Radiology     CT Abdomen Pelvis W IV Contrast Only   Final Result      1. Bilateral nephrolithiasis with mild right-sided hydronephrosis and hydroureter with a 3 mm calculus at the right UVJ. There is mild perivascular stranding which can be seen with cystitis. Correlation with urinalysis is recommended.      2. Hepatic cirrhosis with stigmata of portal hypertension including splenic vein and varices.      3. Additional findings, as described above.        Pertinent labs & imaging results that were available during my care of the patient were independently interpreted by me and considered in my medical decision making (see chart for details).  Portions of this record have been created using Scientist, clinical (histocompatibility and immunogenetics). Dictation errors have been sought, but may not have been identified and corrected.    Documentation assistance was provided by Charolette Forward, Scribe on November 18, 2021 at 12:42 AM for Cheryle Horsfall, DO.    Documentation assistance provided by the above mentioned scribe. I was present during the time the encounter was recorded. The information recorded by the scribe was done at my direction and has been reviewed and validated by me.          Cheryle Horsfall, MD  Resident  11/18/21 1636       Cheryle Horsfall, MD  Resident  11/18/21 [phone]

## 2021-11-19 MED ORDER — NYSTATIN 100,000 UNIT/GRAM TOPICAL POWDER
0 refills | 0 days | Status: CP
Start: 2021-11-19 — End: ?

## 2021-11-19 NOTE — Unmapped (Signed)
Patient is requesting the following refill  Requested Prescriptions     Pending Prescriptions Disp Refills    NYSTOP 100,000 unit/gram powder [Pharmacy Med Name: NYSTOP TOP PWDR 100,000 15GM] 15 g 0     Sig: APPLY TO AFFECTED AREA 3 TIMES DAILY       Recent Visits  Date Type Provider Dept   10/11/21 Office Visit Riley Kill, MD [facility] [address] [place]   09/10/21 Office Visit Alexandria Lodge, MD [facility] [address] [facility]   08/08/21 Office Visit Maralyn Sago Elon Jester, MD [facility] [address] [place]   06/19/21 Telemedicine Aura Camps, FNP [facility] [address] [facility]   06/06/21 Office Visit Maralyn Sago Elon Jester, MD [facility] [address] [facility]   06/05/21 Office Visit Aura Camps, FNP [facility] [address] [facility]   04/05/21 Office Visit Maralyn Sago Elon Jester, MD [facility] [address] [facility]   12/28/20 Office Visit Aura Camps, FNP [facility] [address] [facility]   12/05/20 Office Visit Aura Camps, FNP [facility] [address] [place]   Showing recent visits within past 365 days with a meds authorizing provider and meeting all other requirements  Future Appointments  Date Type Provider Dept   12/11/21 Appointment Maralyn Sago Elon Jester, MD  [facility] [address] [place]   Showing future appointments within next 365 days with a meds authorizing provider and meeting all other requirements       Labs: Not applicable this refill

## 2021-11-25 MED ORDER — GABAPENTIN 100 MG CAPSULE
ORAL_CAPSULE | 3 refills | 0 days
Start: 2021-11-25 — End: ?

## 2021-11-26 ENCOUNTER — Encounter
Admit: 2021-11-26 | Discharge: 2021-11-27 | Payer: PRIVATE HEALTH INSURANCE | Attending: Certified Registered" | Primary: Certified Registered"

## 2021-11-26 ENCOUNTER — Ambulatory Visit: Admit: 2021-11-26 | Discharge: 2021-11-27 | Payer: PRIVATE HEALTH INSURANCE

## 2021-11-26 MED ADMIN — ketamine (KETALAR) injection: INTRAVENOUS | @ 14:00:00 | Stop: 2021-11-26

## 2021-11-26 MED ADMIN — sodium chloride (NS) 0.9 % infusion: INTRAVENOUS | @ 13:00:00 | Stop: 2021-11-26

## 2021-11-26 MED ADMIN — ondansetron (ZOFRAN) injection: INTRAVENOUS | @ 14:00:00 | Stop: 2021-11-26

## 2021-11-26 MED ADMIN — promethazine (PHENERGAN) injection: INTRAVENOUS | @ 14:00:00 | Stop: 2021-11-26

## 2021-11-26 MED ADMIN — caffeine citrate (CAFCIT) injection: INTRAVENOUS | @ 14:00:00 | Stop: 2021-11-26

## 2021-11-26 MED ADMIN — succinylcholine (ANECTINE) injection: INTRAVENOUS | @ 14:00:00 | Stop: 2021-11-26

## 2021-11-26 MED ADMIN — propofoL (DIPRIVAN) injection: INTRAVENOUS | @ 14:00:00 | Stop: 2021-11-26

## 2021-11-26 MED ADMIN — ketorolac (TORADOL) injection: INTRAVENOUS | @ 14:00:00 | Stop: 2021-11-26

## 2021-11-26 NOTE — Unmapped (Signed)
[facility] ECT               Procedure Note                                           Requesting Attending Physician: Not In System Provider  Admit Date: 11/26/2021     Service Type: Inpatient  Requesting Attending Physician: Not In System Provider  Service requesting consult: Psychiatry  Consulting service: Psychiatry  Admit Date: 11/26/2021  Service Date: November 26, 2021    Time out was taken with staff to confirm correct patient and correct procedure to be performed.    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 Much improved    Beck Depression Inventory Total Score:     Baseline: 51  Most recent:  14    Mini-Mental Status ExamTotal Score:     Baseline: 25  Most recent: 30    MEDICAL INFORMATION:    Allergies: Haldol [haloperidol lactate], Tylenol [acetaminophen], Cyclosporine, Tetracycline, and Tramadol    Medical History: See ECT Consult    Surgical History: See ECT Consult    VITAL SIGNS:      Pre-procedure:  131/93    Post-stimulus:   There were no vitals filed for this visit.  154/85  70      THYMATRON:  ECT # Thymatron Settings (%) Modification Seizure   Length Cuff Duration  (sec) EEG Duration  (sec)   1 100 Mild Adequate 31 78       Medications given during procedure:   Current Outpatient Medications   Medication Sig Dispense Refill    cloNIDine HCL (CATAPRES) 0.1 MG tablet Take 1 tablet (0.1 mg total) by mouth nightly. Monitor for dizziness with standing. 30 tablet 2    cyclobenzaprine (FLEXERIL) 5 MG tablet Take 1 tablet (5 mg total) by mouth nightly as needed for muscle spasms. 30 tablet 0    hydrOXYzine (ATARAX) 25 MG tablet Take 2 tablets (50 mg total) by mouth daily as needed for anxiety. 60 tablet 2    nystatin (NYSTOP) 100,000 unit/gram powder APPLY TO AFFECTED AREA 3 TIMES DAILY 15 g 0    sertraline (ZOLOFT) 100 MG tablet Take 1 tablet (100 mg total) by mouth daily. 30 tablet 2    tamsulosin (FLOMAX) 0.4 mg capsule Take 1 capsule (0.4 mg total) by mouth daily for 10 days. 10 capsule 0    XIFAXAN 550 mg Tab Take 1 tablet (550 mg total) by mouth Two (2) times a day. 180 tablet 4     No current facility-administered medications for this encounter.     Facility-Administered Medications Ordered in Other Encounters   Medication Dose Route Frequency Provider Last Rate Last Admin    sodium chloride (NS) 0.9 % infusion   Intravenous Continuous PRN Abundio Miu, CRNA   New Bag at 11/26/21 1610        labetelol 5 mg IV    Neck Collar Used:  Yes     PROGRESS NOTE:     Alexis Burns is a 56 y.o. female who presents for  outpatient continuation ECT. Treatments started in the inpatient setting, and she was discharged home on 08/03/2021.     Patient presents with her husband 2 weeks after last ECT treatment. Feels like it is time for a treatment today. Knows this because she has been having racing thoughts, feeling discouraged, not wanting to do things like working, which she previously enjoyed. Has been feeling like this for the last week. Mood is okay, melancholy. Denies SI. Feeling okay about keeping treatments at q2 weeks for now. Physically, having some pain in her mid-back, states this is typical for her. No worries or concerns regarding her treatment.     Mental Status Exam:  Appearance:    Well nourished and Well developed   Behavior:  Calm and Cooperative   Motor:   No abnormal movements   Speech/Language:    Normal rate, volume, tone, fluency and Language intact, well formed   Mood:   okay, melancholy   Affect:   Calm, euthymic, cooperative, bright   Thought process:   Logical, linear, clear, coherent, goal directed   Thought content:    Denies suicidal ideation   Perceptual disturbances: Denies auditory and visual hallucinations, behavior not concerning for response to internal stimuli   Orientation:   Oriented to person, place, time, and general circumstances   Attention:   Able to fully attend without fluctuations in consciousness   Concentration:   Able to fully concentrate and attend     Physical Exam:   Gen: NAD  Pulm: No increased work of breathing  Neuro: No tremors, tics, or abnormal movements    Assessment and Plan:   Mood Disorder: Maj Depress D/O, 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     Seizure was of adequate length, fair amplitude, fair morphology, and poor post-ictal suppression.    Interventions Today:    Medication : caffeine  120mg , labetelol 5 mg IV  Energy adjustments: none    Post-Treatment Complications: Hypertension treated with labetalol 5mg      Recommendations for Future Treatments:    Medication adjustments:  none  Energy adjustments:       Frequency: return in  2 weeks  then increase as tolerated    Next Treatment Date: 12/10/21    Meds to take prior to ECT: None    Special Instructions: none    Assisted by: Dimas Aguas MD    Alexis Harvest, MD

## 2021-11-29 ENCOUNTER — Telehealth: Admit: 2021-11-29 | Discharge: 2021-11-30 | Payer: PRIVATE HEALTH INSURANCE | Attending: Clinical | Primary: Clinical

## 2021-11-29 DIAGNOSIS — F419 Anxiety disorder, unspecified: Principal | ICD-10-CM

## 2021-11-29 DIAGNOSIS — F332 Major depressive disorder, recurrent severe without psychotic features: Principal | ICD-10-CM

## 2021-11-29 MED ORDER — GABAPENTIN 100 MG CAPSULE
ORAL_CAPSULE | 3 refills | 0 days
Start: 2021-11-29 — End: ?

## 2021-11-29 NOTE — Unmapped (Signed)
I spent 40 minutes on phone with the patient. I spent an additional 0 minutes on pre- and post-visit activities.  The patient was physically located in [place] or a state in which I am permitted to provide care. The patient understood that s/he may incur co-pays and cost sharing and agreed to the telemedicine visit. The visit was completed via phone and/or video, which was appropriate and reasonable under the circumstances given the patient's presentation at the time.     The patient has been advised of the potential risks and limitations of this mode of treatment (including, but not limited to, the absence of in-person examination) and has agreed to be treated using telemedicine. The patient's/patient's family's questions regarding telemedicine have been answered.      If the phone/video visit was completed in an ambulatory setting, the patient has also been advised to contact their provider???s office for worsening conditions and seek emergency medical treatment and/or call 911 if the patient deems necessary.        [facility]       STEP Patient Psychotherapy    Name: Alexis Burns  Date: 11/29/2021  MRN: [id number]  DOB: 1965-11-06  PCP: Aura Camps, FNP    Service Duration:  40 minutes        Service: Outpatient Therapy- Individual   [x]  Phone     Date of Last Encounter:  Visit date not found    Mental Status/Behavioral Observations  Affect:  Mood congruent   Mood:   anxious   Thought Process:  Goal directed and Linear   Behavior:   Cooperative and Direct eye contact   Self Harm: none and future oriented     Purpose of contact:    [x]   Continue to address treatment goals  []   Treatment Planning/Treatment progress review []  Discharge Planning     Interventions Provided:     [x]   CBT  []   Interpersonal Process Therapy []  Acceptance & Commitment Therapy (ACT)  []  DBT  []  Motivational Interviewing  []  Behavioral Activation []  Psycho-Education  []  Exposure Therapy  []  Trauma-Informed CBT [x]  Person Centered  [x]  Supportive Therapy    Patient Response/Progress:  Identifying Information: Ms. Langstaff is an ADTC patient since 2019 with a history of depression and anxiety, with past hospitalization in 2017 at [facility]. Over several years until late 2020, she was fairly stable on duloxetine 90mg  and Abilify 5mg . She used to work in a Engineer, [employer] pre-pandemic in a stressful role, took time off for a leg injury, and then when she went back to work she was fired. This was destabilizing, and it was closely followed by tibial tendon surgery in and then suspected COVID infection. By Feb 2021, mood remained unstable. She had an exacerbation of depression in May 2021 with her son's mental health as a major stressor, and after intolerance of increasing Cymbalta to 120mg , ineffectiveness of Seroquel, intolerance of Wellbutrin, and time-limited benefit of risperidone.In 2021 she was admitted to the [facility] for suicidal ideation with a plan to shoot herself. Medication was tolerated well and effective for mood, and mood has varied with family stressors. Both insomnia and hypersomnia have been problems for Ms. Mikel, and prns have been variably helpful, by spring 2022 sleep had improved with more physical activity.  11/15/21 POC Target Outcomes:reduction in symptoms, including I can't turn my brain off, depression, anxiety, and aggravation/anger, increased independent functioning, specifically not being spoiled and dependent on  her husband, reduction in aggresive ideation, employment, improved community integration, including avoiding isolation, and stop focusing on the negative.and stop taking things personally focus on good health.  Treatment/Process:   Patient joins today by phone for individual therapy. This session was conducted as a clinical therapy visit to assess acute safety, monitor ongoing clinical treatment plan, and provide continuity to outpatient treatment in the setting of Covid19 Pandemic.  Patient appropriately engaged in the discussion and was able to follow the course of the conversation; presenting anxious, without indication of psychosis, cooperatively, and future oriented.   Clinician facilitated discussion of pt status, concerns, and /review/development of POC with session content including:  Pt reports she is lying in bed being physically very tired and not wanting to get out of bed.  Pt reports I'm very tired of being sick this liver thing is about to get the best of me I vomit all the time (noting carrying a red bucket around) which overshadows everything else noting having a follow up next month.  Pt reports having been recently dx with kidney stones but no current follow up is plan at this time.  Pt reports ECT is fine having become routine.  Pt reports having had cell service go down for several days, prompting pt to reflect on no one having checked up on her/them.  Pt reports her husband has been very supportive and having FMLA has been able to be with her more.  Pt reports her son [name], has stopped using nicotine and is hateful.  Pt discusses how her priorities have changed big time noting I don't give 'a shit' what anybody thinks anymore.  Clinician processed session content with pt commenting that she hasn't gotten dressed in 2 days but is planning to get up and get dressed.    RISK ASSESSMENT: A suicide and violence risk assessment was performed as part of this evaluation.  While future psychiatric events cannot be accurately predicted, the patient does not currently require acute inpatient psychiatric care and does not currently meet [facility] involuntary commitment criteria.      Diagnoses:   Severe recurrent major depression 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) [F33.2]   Anxiety [F41.9]    [name], LCSW  [facility]   11/29/21    Debroah Loop, LCSW  10:05 AM

## 2021-11-30 NOTE — Unmapped (Signed)
Alexis Burns looks like she is currently at [facility] [place]

## 2021-12-05 NOTE — Unmapped (Signed)
[facility] Refill Coordination Note    Alexis Burns, Alexis Burns: [birth year]  Phone: [phone] (home)       All above HIPAA information was verified with patient.         12/04/2021     3:42 PM   Specialty Rx Medication Refill Questionnaire   Which Medications would you like refilled and shipped? Xifaxan , I have enough for about 10 days.   Please list all current allergies: Haldol   Have you missed any doses in the last 30 days? No   Have you had any changes to your medication(s) since your last refill? No   How many days remaining of each medication do you have at home? 10   Have you experienced any side effects in the last 30 days? No   Please enter the full address (street address, city, state, zip code) where you would like your medication(s) to be delivered to. [name] [place] [address]   Please specify on which day you would like your medication(s) to arrive. Note: if you need your medication(s) within 3 days, please call the pharmacy to schedule your order at [phone]  12/13/2021   Has your insurance changed since your last refill? No   Would you like a pharmacist to call you to discuss your medication(s)? No   Do you require a signature for your package? (Note: if we are billing Medicare Part B or your order contains a controlled substance, we will require a signature) No         Completed refill call assessment today to schedule patient's medication shipment from the [facility] [phone]).  All relevant notes have been reviewed.       Confirmed patient received a Conservation officer, historic buildings and a Surveyor, mining with first shipment. The patient will receive a drug information handout for each medication shipped and additional FDA Medication Guides as required.         REFERRAL TO PHARMACIST     Referral to the pharmacist: Not needed      Alexis Burns     Shipping address confirmed in Epic.     Delivery Scheduled: Yes, Expected medication delivery date: 10/12.     Medication will be delivered via Next Day Courier to the prescription address in Epic [name].    Alexis Burns   [facility] Specialty Technician

## 2021-12-05 NOTE — Unmapped (Signed)
Alexis Burns/Dr. Loleta Rose

## 2021-12-10 ENCOUNTER — Ambulatory Visit: Admit: 2021-12-10 | Discharge: 2021-12-11 | Payer: PRIVATE HEALTH INSURANCE

## 2021-12-10 ENCOUNTER — Encounter
Admit: 2021-12-10 | Discharge: 2021-12-11 | Payer: PRIVATE HEALTH INSURANCE | Attending: Anesthesiology | Primary: Anesthesiology

## 2021-12-10 DIAGNOSIS — R109 Unspecified abdominal pain: Principal | ICD-10-CM

## 2021-12-10 DIAGNOSIS — R1114 Bilious vomiting: Principal | ICD-10-CM

## 2021-12-10 MED ADMIN — caffeine citrate (CAFCIT) injection: INTRAVENOUS | @ 13:00:00 | Stop: 2021-12-10

## 2021-12-10 MED ADMIN — sodium chloride (NS) 0.9 % infusion: INTRAVENOUS | @ 13:00:00 | Stop: 2021-12-10

## 2021-12-10 MED ADMIN — succinylcholine (ANECTINE) injection: INTRAVENOUS | @ 13:00:00 | Stop: 2021-12-10

## 2021-12-10 MED ADMIN — promethazine (PHENERGAN) injection: INTRAVENOUS | @ 13:00:00 | Stop: 2021-12-10

## 2021-12-10 MED ADMIN — ketamine (KETALAR) injection: INTRAVENOUS | @ 13:00:00 | Stop: 2021-12-10

## 2021-12-10 MED ADMIN — ketorolac (TORADOL) injection: INTRAVENOUS | @ 13:00:00 | Stop: 2021-12-10

## 2021-12-10 MED ADMIN — propofoL (DIPRIVAN) injection: INTRAVENOUS | @ 13:00:00 | Stop: 2021-12-10
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
## 2021-12-10 NOTE — Unmapped (Addendum)
Stop the lactulose      Get Vear Clock Laxative Caplets at the pharmacy take 1-2 per day for cramping and muscle pain.        Xray today.       Body Armor Lyte  Drink as much.       I've ordered an EGD.         Please follow a 2000mg  sodium restricted diet  Increase your protein intake by eating eggs, chicken, fish, yogurt and/or supplements.       It is OK to take up to 2000mg  of acetaminophen (tylenol), please be cautious of combination products. Do not take ibuprofen, naproxen, aspirin, advil, aleve, motrin, Excedrin or headache powders. Avoid herbal or natural supplements.      Please contact my nurse, Josie Dixon, RN at [phone] If you have questions or concerns or need to get in touch with me.       Please Return to clinic in 3 months       If you have any questions or concerns please contact us on MyChart or as below:     For appointments, please call [phone].  For scheduling radiology appointments [phone].  For scheduling GI procedures (e.g,. Colonoscopy), please call [phone].  For emergencies after hours call [phone] and ask for the GI medicine fellow on call.          Vallery Sa, NP-C  [facility]  [address] [place], [place] [address]  [place], [place] [address]  Ph: [phone]  Fax: [phone]

## 2021-12-10 NOTE — Unmapped (Signed)
[facility]               Procedure Note                                           Requesting Attending Physician: Unknown Per Patient Referring  Admit Date: 12/10/2021     Service Type: Inpatient  Requesting Attending Physician: Unknown Per Patient Referring  Service requesting consult: Psychiatry  Consulting service: Psychiatry  Admit Date: 12/10/2021  Service Date: December 10, 2021    Time out was taken with staff to confirm correct patient and correct procedure to be performed.    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 Much improved    Beck Depression Inventory Total Score:     Baseline: 51  Most recent:  26    Mini-Mental Status ExamTotal Score:     Baseline: 25  Most recent: 30    MEDICAL INFORMATION:    Allergies: Haldol [haloperidol lactate], Tylenol [acetaminophen], Cyclosporine, Tetracycline, and Tramadol    Medical History: See ECT Consult    Surgical History: See ECT Consult    VITAL SIGNS:      Pre-procedure:  170/98  89    Post-stimulus:   There were no vitals filed for this visit.     70      THYMATRON:  ECT # Thymatron Settings (%) Modification Seizure   Length Cuff Duration  (sec) EEG Duration  (sec)   1 100 Mild Adequate 35 75       Medications given during procedure:   Current Outpatient Medications   Medication Sig Dispense Refill   ??? cloNIDine HCL (CATAPRES) 0.1 MG tablet Take 1 tablet (0.1 mg total) by mouth nightly. Monitor for dizziness with standing. 30 tablet 2   ??? cyclobenzaprine (FLEXERIL) 5 MG tablet Take 1 tablet (5 mg total) by mouth nightly as needed for muscle spasms. 30 tablet 0   ??? hydrOXYzine (ATARAX) 25 MG tablet Take 2 tablets (50 mg total) by mouth daily as needed for anxiety. 60 tablet 2   ??? nystatin (NYSTOP) 100,000 unit/gram powder APPLY TO AFFECTED AREA 3 TIMES DAILY 15 g 0   ??? sertraline (ZOLOFT) 100 MG tablet Take 1 tablet (100 mg total) by mouth daily. 30 tablet 2   ??? XIFAXAN 550 mg Tab Take 1 tablet (550 mg total) by mouth Two (2) times a day. 180 tablet 4     No current facility-administered medications for this encounter.        labetelol 5 mg IV    Neck Collar Used:  Yes     PROGRESS NOTE:     Alexis Burns is a 56 y.o. female who presents for outpatient continuation ECT. Treatments started in the inpatient setting, and she was discharged home on 08/03/2021.     [place]: Patients presents 2 weeks after her last treatment. States that her mental health has taken a backseat to her physical health problems - her liver is not filtering toxins anymore, has to take laxatives and antibiotics. Experiences a lot of vomiting. This AM has a  HA from being dehydrated, feels nauseous. Feels like she does not have time to think about how she is feeling mentally. Sees hepatology here at [facility]. Discuss plan to pre-treat with phenergan. She also asks for something for HA. She hopes to spread out her next treatment to 3 weeks.     Mental Status Exam:  Appearance:    Well nourished and Well developed   Behavior:  Calm and Cooperative   Motor:   No abnormal movements   Speech/Language:    Normal rate, volume, tone, fluency and Language intact, well formed   Mood:   okay, melancholy   Affect:   Calm, euthymic, cooperative, bright   Thought process:   Logical, linear, clear, coherent, goal directed   Thought content:    Denies suicidal ideation   Perceptual disturbances:     Denies auditory and visual hallucinations, behavior not concerning for response to internal stimuli   Orientation:   Oriented to person, place, time, and general circumstances   Attention:   Able to fully attend without fluctuations in consciousness   Concentration:   Able to fully concentrate and attend Physical Exam:   Gen: NAD  Pulm: No increased work of breathing  Neuro: No tremors, tics, or abnormal movements    Assessment and Plan:   Mood Disorder: Maj Depress D/O, 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     Seizure was of adequate length, fair amplitude, fair morphology, and poor post-ictal suppression.    Interventions Today:    ??? Medication : caffeine  120mg , labetelol 5 mg IV  ??? Energy adjustments: none    Post-Treatment Complications: Hypertension treated with labetalol 5mg      Recommendations for Future Treatments:    ??? Medication adjustments:  none  ??? Energy adjustments:   100%    ??? Frequency: return in 3 weeks then increase as tolerated    Next Treatment Date: 12/31/21    Meds to take prior to ECT: None    Special Instructions: none    Assisted by: Dimas Aguas MD    Lorelee Cover, MD

## 2021-12-11 ENCOUNTER — Ambulatory Visit
Admit: 2021-12-11 | Discharge: 2021-12-12 | Payer: PRIVATE HEALTH INSURANCE | Attending: Family Medicine | Primary: Family Medicine

## 2021-12-11 DIAGNOSIS — R7303 Prediabetes: Principal | ICD-10-CM

## 2021-12-11 DIAGNOSIS — Z6838 Body mass index (BMI) 38.0-38.9, adult: Principal | ICD-10-CM
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
## 2021-12-11 NOTE — Unmapped (Signed)
Hx of preDM. Reviewed relevant medications and labs. Pt is following our [facility]. See Obesity tab for medication changes.

## 2021-12-11 NOTE — Unmapped (Signed)
EGD  Procedure #1   0  Procedure #2   [id number]  MRN   generic  Endoscopist   FALSE  Is the patient's health insurance [address], [place] [facility]), or [employer]?   FALSE  Urgent procedure   FALSE  Do you take: Plavix (clopidogrel), Coumadin (warfarin), Lovenox (enoxaparin), Pradaxa (dabigatran), Effient (prasugrel), Xarelto (rivaroxaban), Eliquis (apixaban), Pletal (cilostazol), or Brilinta (ticagrelor)?   FALSE  Do you have hemophilia, von Willebrand disease, thrombocytopenia?   FALSE  Do you have a pacemaker or implanted cardiac defibrillator?   FALSE  Are you pregnant?   FALSE  Has a [facility] provider specified the location(s)?     Which location(s) did the [facility] provider specify?   FALSE     [facility]   FALSE     [place]   FALSE     [facility]of[facility]   FALSE     [facility]Mod Sedation   FALSE  Is procedure indication for variceal banding (this does NOT include variceal screening)?        5  Height (feet)   5  Height (inches)   239  Weight (pounds)   39.8  BMI        FALSE  Did the ordering provider specify a bowel prep?   0       What bowel prep was specified?   FALSE  Do you have chronic kidney disease?   FALSE  Do you have chronic constipation or have you had poor quality bowel preps for past colonoscopies?   FALSE  Do you have Crohn's disease or ulcerative colitis?   FALSE  Have you had weight loss surgery?        FALSE  Are you in the process of scheduling or awaiting results of a heart ultrasound, stress test, or catheterization to evaluate new or worsening chest pain, dizziness, or shortness of breath?   FALSE  When you walk around your house or grocery store, do you have to stop and rest due to shortness of breath, chest pain, or light-headedness?   FALSE  Have you had a heart attack, stroke or heart stent placement within the past 6 months?   FALSE  Do you ever use supplemental oxygen?   FALSE  Have you been hospitalized for cirrhosis of the liver or heart failure in the last 12 months?   FALSE  Have you been treated for mouth or throat cancer with radiation or surgery?   FALSE  Have you been told that it is difficult for doctors to insert a breathing tube in you during anesthesia?   FALSE  Have you had a heart or lung transplant?        FALSE  Are you on dialysis?   TRUE  Do you have cirrhosis of the liver?   FALSE  Do you have myasthenia gravis?   FALSE  Is the patient a prisoner?        FALSE  Have you been diagnosed with sleep apnea or do you wear a CPAP machine at night?   FALSE  Are you younger than 30?   FALSE  Have you previously received propofol sedation administered by an anesthesiologist for a GI procedure?   FALSE  Do you drink an average of more than 3 drinks of alcohol per day?   FALSE  Do you regularly take suboxone or any prescription medications for chronic pain?   FALSE  Do you regularly take Ativan, Klonopin, Xanax, Valium, lorazepam, clonazepam, alprazolam,  or diazepam?   FALSE  Have you previously had difficulty with sedation during a GI procedure?   FALSE  Have you been diagnosed with PTSD?   FALSE  Are you allergic to fentanyl or midazolam (Versed)?   FALSE  Do you take medications for HIV?   ################### ###################################################################################################################   MRN:          [id number]   Anticoag Review:  No   Nurse Triage:  No   GI Clinic Consult:  No   Procedure(s):  EGD     0   [facility]     [facility]              Endoscopist:  generic   Urgent:            No   Prep:                       ################### ###################################################################################################################

## 2021-12-11 NOTE — Unmapped (Addendum)
Alexis Burns is a 56 y.o. female with Class 3 obesity due to weight gain associated with unhealthy lifestyle, including physical inactivity and frequent consumption of ultraprocessed foods and products with high amounts of added sugars, including Mt. Dew. Additionally, Pt gained weight with smoking cessation and post-partum weight retention. Comorbidities include prediabetes, hyperlipidemia, hypertension, cirrhosis, depression. Barriers include BED, depression/anxiety, ankle and back pain.    Weight Summary:  Starting weight/BMI/WC/[place]: 245 lbs, BMI 40.77, WC 44, [place] 14 (04/05/2021)  Target weight/goal: No goal weight. Patient wants general improvement in health and appearance.  Today's weight/BMI: (!) 108.4 kg (239 lb 1.3 oz), BMI 38  (12/11/2021)  % Body weight loss: N/A  Today's Waist Circumference: 45.5 inches (12/11/2021)  Today's visit number: 5th  Duration in program: 8 mos    Referred by: Self, Referred    GOALS: 1) continue workup for vomiting 2) continue to hold ozempic    I have reviewed the patient's medical history, lifestyle history and labs/tests.   My recommendations include the following:    Lifestyle Pattern Summary:   - See GOALS above.     Medication: hold ozempic due to vomiting    Tried/Contraindicated:  - Wellbutrin: CI d/t anxiety  - Topiramate: CI d/t Hx of nephroliathisis (confirmed by CT)     Obesity Surgery: Declines for now. Wishes to try medical management but will consider in the future. Pt has had ~10 surgeries including C-section x2, cholecystectomy, hysterectomy & other musculoskeletal surgeries. Pt will keep all options open, especially due to multiple comorbidities including cirrhosis. (Updated 04/05/2021)    Medical conditions:  Glaucoma: no  Seizures: no  Medullary thyroid cancer (personal or family hx): no  Multiple Endocrine Neoplasia: no  Palpitations/Tachycardia: no  Chest Pain: no past history of MI.   Headaches/Migraines: YES  Nephrolithiasis: YES  H/o pancreatitis: YES -- gallstone pancreatitis 35 yrs ago  GERD: no     Prior Surgeries:  Cholecystectomy: YES  Hysterectomy: YES    Birth Control Methods: N/A--post meno/hysterectomy

## 2021-12-12 MED FILL — XIFAXAN 550 MG TABLET: ORAL | 30 days supply | Qty: 60 | Fill #5

## 2021-12-13 ENCOUNTER — Telehealth: Admit: 2021-12-13 | Discharge: 2021-12-14 | Payer: PRIVATE HEALTH INSURANCE | Attending: Clinical | Primary: Clinical

## 2021-12-13 DIAGNOSIS — F419 Anxiety disorder, unspecified: Principal | ICD-10-CM

## 2021-12-13 DIAGNOSIS — F332 Major depressive disorder, recurrent severe without psychotic features: Principal | ICD-10-CM

## 2021-12-13 NOTE — Unmapped (Signed)
I spent 57 minutes on video with the patient. I spent an additional 0 minutes on pre- and post-visit activities.  The patient was physically located in [place] or a state in which I am permitted to provide care. The patient understood that s/he may incur co-pays and cost sharing and agreed to the telemedicine visit. The visit was completed via phone and/or video, which was appropriate and reasonable under the circumstances given the patient's presentation at the time.     The patient has been advised of the potential risks and limitations of this mode of treatment (including, but not limited to, the absence of in-person examination) and has agreed to be treated using telemedicine. The patient's/patient's family's questions regarding telemedicine have been answered.      If the phone/video visit was completed in an ambulatory setting, the patient has also been advised to contact their provider???s office for worsening conditions and seek emergency medical treatment and/or call 911 if the patient deems necessary.        [facility]       STEP Patient Psychotherapy    Name: Alexis Burns  Date: 12/13/2021  MRN: [id number]  DOB: Oct 10, 1965  PCP: Aura Camps, FNP    Service Duration:  57 minutes        Service: Outpatient Therapy- Individual   [x]  Video     Date of Last Encounter:  Visit date not found    Mental Status/Behavioral Observations  Affect:  Mood congruent   Mood:   anxious   Thought Process:  Goal directed and Linear   Behavior:   Cooperative and Direct eye contact   Self Harm: none and future oriented     Purpose of contact:    [x]   Continue to address treatment goals  []   Treatment Planning/Treatment progress review []  Discharge Planning     Interventions Provided:     [x]   CBT  []   Interpersonal Process Therapy []  Acceptance & Commitment Therapy (ACT)  []  DBT  []  Motivational Interviewing  []  Behavioral Activation []  Psycho-Education  []  Exposure Therapy  []  Trauma-Informed CBT [x]  Person Centered  [x]  Supportive Therapy    Patient Response/Progress:  Identifying Information: Alexis Burns is an ADTC patient since 2019 with a history of depression and anxiety, with past hospitalization in 2017 at [facility]. Over several years until late 2020, she was fairly stable on duloxetine 90mg  and Abilify 5mg . She used to work in a [employer], [employer] pre-pandemic in a stressful role, took time off for a leg injury, and then when she went back to work she was fired. This was destabilizing, and it was closely followed by tibial tendon surgery in and then suspected COVID infection. By Feb 2021, mood remained unstable. She had an exacerbation of depression in May 2021 with her son's mental health as a major stressor, and after intolerance of increasing Cymbalta to 120mg , ineffectiveness of Seroquel, intolerance of Wellbutrin, and time-limited benefit of risperidone.In 2021 she was admitted to the [facility] for suicidal ideation with a plan to shoot herself. Medication was tolerated well and effective for mood, and mood has varied with family stressors. Both insomnia and hypersomnia have been problems for Alexis Burns, and prns have been variably helpful, by spring 2022 sleep had improved with more physical activity.  11/15/21 POC Target Outcomes: reduction in symptoms, including I can't turn my brain off, depression, anxiety, and aggravation/anger, increased independent functioning, specifically not being spoiled and dependent  on her husband, reduction in aggresive ideation, employment, improved community integration, including avoiding isolation, and stop focusing on the negative.and stop taking things personally focus on good health.  Treatment/Process:   Patient joins today by video for individual therapy. This session was conducted as a clinical therapy visit to assess acute safety, monitor ongoing clinical treatment plan, and provide continuity to outpatient treatment in the setting of Covid19 Pandemic.  Patient appropriately engaged in the discussion and was able to follow the course of the conversation; presenting anxious, without indication of psychosis, cooperatively, and future oriented.   Clinician facilitated discussion of pt status, concerns, and /review/development of POC with session content including:  Pt reports between vomiting and feeling crap she is going to the [place] to the mountains today.  Pt reports no body has given me anything to help it Delta 8 gummi helps the nausea and feeling like crap which pt reports is related to the vomiting.   Pt reports that her husband told her to take a gummi before they travel to the mountains.  Pt declares, lets talk about Alexis Burns with pt reflecting on all of his positive qualities/characteristics.  Pt reflects on what would I do if he leaves first noting I don't know what I'd do.  Pt reports that Alexis Burns takes care of Alexis Burns too given Alexis Burns is the sole breadwinner and helps Alexis Burns.  Pt reflects on a recent visit by a relative who ended up in the hospital a week ago today had has been dx with leukemia. Pt reflects on the circumstances noting if he lives through it.  Pt reflects on my thoughts related to her not being able to turn her brain off sharing she has doubled some of her medication which has allowed me to drift off to sleep more naturally, my mind is not racing.  Pt reflects we're getting ready to put down our 56 year old Alexis Burns noting how it is likely to occur, you have to be unselfish enough to let them go. Pt shows therapist her new dog Alexis Burns.  Pt reports no to focusing on the negatives because most of my negatives are no longer in my life noting my sister's noting I am talking to the middle sister.  Pt reflects on her hx of aggressive and I've always been mouthy but I'm not really mouthy anymore I've learned the art of tact.  Clinician processed session content with pt.    RISK ASSESSMENT: A suicide and violence risk assessment was performed as part of this evaluation.  While future psychiatric events cannot be accurately predicted, the patient does not currently require acute inpatient psychiatric care and does not currently meet [facility] involuntary commitment criteria.      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

## 2021-12-13 NOTE — Unmapped (Signed)
Addendum  created 12/13/21 0933 by Theone Murdoch, CRNA    Intraprocedure Event edited, Intraprocedure Staff edited

## 2021-12-24 ENCOUNTER — Ambulatory Visit: Admit: 2021-12-24 | Discharge: 2021-12-25 | Payer: PRIVATE HEALTH INSURANCE

## 2021-12-24 DIAGNOSIS — H524 Presbyopia: Principal | ICD-10-CM

## 2021-12-24 DIAGNOSIS — H52203 Unspecified astigmatism, bilateral: Principal | ICD-10-CM

## 2021-12-24 DIAGNOSIS — H35721 Serous detachment of retinal pigment epithelium, right eye: Principal | ICD-10-CM

## 2021-12-24 DIAGNOSIS — H2513 Age-related nuclear cataract, bilateral: Principal | ICD-10-CM

## 2021-12-24 DIAGNOSIS — H5213 Myopia, bilateral: Principal | ICD-10-CM

## 2021-12-24 NOTE — Unmapped (Signed)
Diagnosis ICD-10-CM Associated Orders   1. Senile nuclear sclerosis, bilateral  H25.13       2. 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 Myopia with astigmatism and presbyopia, bilateral  H52.13     H52.203     H52.4           Patient has a family h/o AMD.     The patient was given an Amsler grid and instructed on how to use the grid.    To check the Amsler Grid we recommend:  -Wear your corrective lenses when checking the grid.  -Hold the grid at arm's length.  -Check each eye individually by covering the eye you are not checking.  -First check to make sure you can see the black dot in the middle of the grid.  -Second check to make sure the lines are straight (vertical and horizontal lines).  -Third check to make sure the small boxes are square and not rounded nor distorted.      It is not unusual to see the grid as abnormal in either or both eyes depending on the severity of the disease.  Also the grid may appear different in each eye.  The more important issue is that you check this grid daily and establish a baseline for each eye.    We recommend that you contact the office immediately if you notice a change in the grid appearance in either eye as this may signify progression of the disease and require treatment. A delay in treatment may adversely affect long-term vision.    Glasses Rx updated and dispensed.

## 2021-12-26 ENCOUNTER — Encounter
Admit: 2021-12-26 | Discharge: 2021-12-26 | Payer: PRIVATE HEALTH INSURANCE | Attending: Registered Nurse | Primary: Registered Nurse

## 2021-12-26 ENCOUNTER — Ambulatory Visit: Admit: 2021-12-26 | Discharge: 2021-12-26 | Payer: PRIVATE HEALTH INSURANCE

## 2021-12-26 MED ADMIN — albuterol (PROVENTIL HFA;VENTOLIN HFA) 90 mcg/actuation inhaler: RESPIRATORY_TRACT | @ 13:00:00 | Stop: 2021-12-26

## 2021-12-26 MED ADMIN — esmoloL (BREVIBLOC) injection: INTRAVENOUS | @ 13:00:00 | Stop: 2021-12-26

## 2021-12-26 MED ADMIN — lactated Ringers infusion: 10 mL/h | INTRAVENOUS | @ 12:00:00 | Stop: 2021-12-26

## 2021-12-26 MED ADMIN — promethazine (PHENERGAN) injection: INTRAVENOUS | @ 13:00:00 | Stop: 2021-12-26

## 2021-12-26 MED ADMIN — propofoL (DIPRIVAN) injection: INTRAVENOUS | @ 13:00:00 | Stop: 2021-12-26

## 2021-12-26 MED ADMIN — fentaNYL (PF) (SUBLIMAZE) injection: INTRAVENOUS | @ 13:00:00 | Stop: 2021-12-26

## 2021-12-26 MED ADMIN — succinylcholine chloride (ANECTINE) injection: INTRAVENOUS | @ 13:00:00 | Stop: 2021-12-26

## 2021-12-26 MED ADMIN — ondansetron (ZOFRAN) injection: INTRAVENOUS | @ 13:00:00 | Stop: 2021-12-26

## 2021-12-26 MED ADMIN — dexAMETHasone (DECADRON) 4 mg/mL injection: INTRAVENOUS | @ 13:00:00 | Stop: 2021-12-26

## 2021-12-26 MED ADMIN — lidocaine (XYLOCAINE) 20 mg/mL (2 %) injection: INTRAVENOUS | @ 13:00:00 | Stop: 2021-12-26

## 2021-12-27 DIAGNOSIS — R1114 Bilious vomiting: Principal | ICD-10-CM

## 2021-12-27 MED ORDER — PROMETHAZINE 12.5 MG RECTAL SUPPOSITORY
Freq: Four times a day (QID) | RECTAL | 0 refills | 3 days | Status: CP | PRN
Start: 2021-12-27 — End: 2022-01-03

## 2021-12-30 NOTE — Unmapped (Signed)
[facility] ECT               Procedure Note                                           Requesting Attending Physician: Unknown Per Patient Referring  Admit Date: 12/31/2021     Service Type: Inpatient  Requesting Attending Physician: Unknown Per Patient Referring  Service requesting consult: Psychiatry  Consulting service: Psychiatry  Admit Date: 12/31/2021  Service Date: December 31, 2021    Time out was taken with staff to confirm correct patient and correct procedure to be performed.    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 Much improved    Beck Depression Inventory Total Score:     Baseline: 51  Most recent:  17    Mini-Mental Status ExamTotal Score:     Baseline: 25  Most recent: 30    MEDICAL INFORMATION:    Allergies: Haldol [haloperidol lactate], Cyclosporine, Tetracycline, and Tramadol    Medical History: See ECT Consult    Surgical History: See ECT Consult    VITAL SIGNS:      Pre-procedure:  185/76  79    Post-stimulus:   186/92  85      THYMATRON:  ECT # Thymatron Settings (%) Modification Seizure   Length Cuff Duration  (sec) EEG Duration  (sec)   1 100 Mild Adequate 28 79       Medications given during procedure:   Current Outpatient Medications   Medication Sig Dispense Refill    cloNIDine HCL (CATAPRES) 0.1 MG tablet Take 1 tablet (0.1 mg total) by mouth nightly. Monitor for dizziness with standing. 30 tablet 2    hydrOXYzine (ATARAX) 25 MG tablet Take 2 tablets (50 mg total) by mouth daily as needed for anxiety. 60 tablet 2    ondansetron (ZOFRAN-ODT) 8 MG disintegrating tablet Take 1 tablet (8 mg total) by mouth every eight (8) hours as needed for nausea.      promethazine (PHENERGAN) 12.5 MG suppository Insert 1 suppository (12.5 mg total) into the rectum every six (6) hours as needed for nausea for up to 7 days. 12 each 0    sertraline (ZOLOFT) 100 MG tablet Take 1 tablet (100 mg total) by mouth daily. 30 tablet 2    SUMAtriptan (IMITREX) 50 MG tablet TAKE 1 TABLET BY MOUTH EVERY 2 HOURS AS NEEDED FOR MIGRAINES      XIFAXAN 550 mg Tab Take 1 tablet (550 mg total) by mouth Two (2) times a day. 180 tablet 4     No current facility-administered medications for this encounter.     Facility-Administered Medications Ordered in Other Encounters   Medication Dose Route Frequency Provider Last Rate Last Admin    caffeine citrate (CAFCIT) injection   Intravenous PRN (once a day) Quentin Cornwall, CRNA   120 mg at 12/31/21 1242    ketamine (KETALAR) injection   Intravenous PRN (once a day) Quentin Cornwall, CRNA   60 mg at 12/31/21 1245    ketorolac (TORADOL) injection   Intravenous  PRN (once a day) Quentin Cornwall, CRNA   15 mg at 12/31/21 1241    ondansetron (ZOFRAN) injection   Intravenous PRN (once a day) Quentin Cornwall, CRNA   4 mg at 12/31/21 1241    promethazine (PHENERGAN) injection   Intravenous PRN (once a day) Quentin Cornwall, CRNA   12.5 mg at 12/31/21 1241    propofoL (DIPRIVAN) injection   Intravenous PRN (once a day) Quentin Cornwall, CRNA   80 mg at 12/31/21 1246    sodium chloride (NS) 0.9 % infusion   Intravenous Continuous PRN Quentin Cornwall, CRNA   New Bag at 12/31/21 1230    succinylcholine (ANECTINE) injection   Intravenous PRN (once a day) Quentin Cornwall, CRNA   120 mg at 12/31/21 1247        labetelol 5 mg IV    Neck Collar Used:  Yes     PROGRESS NOTE:     Alexis Burns is a 56 y.o. female who presents for outpatient continuation ECT  3 weeks after her last treatment, her first at this interval. She had an upper GI earlier this week showing gastric erosions    . States that her mental health has taken a backseat to her physical health problems - her liver is not filtering toxins anymore, has to take laxatives and antibiotics. Experiences a lot of vomiting. This AM has a HA from being dehydrated, feels nauseous. Feels like she does not have time to think about how she is feeling mentally. Sees hepatology here at [facility]. Discuss plan to pre-treat with phenergan. She also asks for something for HA. She hopes to spread out her next treatment to 3 weeks.     Mental Status Exam:  Appearance:    Well nourished and Well developed   Behavior:  Calm and Cooperative   Motor:   No abnormal movements   Speech/Language:    Normal rate, volume, tone, fluency and Language intact, well formed   Mood:   okay, melancholy   Affect:   Calm, euthymic, cooperative, bright   Thought process:   Logical, linear, clear, coherent, goal directed   Thought content:    Denies suicidal ideation   Perceptual disturbances:     Denies auditory and visual hallucinations, behavior not concerning for response to internal stimuli   Orientation:   Oriented to person, place, time, and general circumstances   Attention:   Able to fully attend without fluctuations in consciousness   Concentration:   Able to fully concentrate and attend     Physical Exam:   Gen: NAD  Pulm: No increased work of breathing  Neuro: No tremors, tics, or abnormal movements    Assessment and Plan:   Mood Disorder: Maj Depress D/O, 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     Seizure was of adequate length, fair amplitude, fair morphology, and poor post-ictal suppression.    Interventions Today:    Medication : caffeine  120mg , labetelol 5 mg IV  Energy adjustments: none    Post-Treatment Complications: Hypertension treated with labetalol 5mg      Recommendations for Future Treatments:    Medication adjustments:  none  Energy adjustments:    100%    Frequency: return in 3 weeks then increase as tolerated    Next Treatment Date: 01/28/22    Meds to take prior to ECT: None    Special Instructions: none    Assisted by:     Willey Blade, MD

## 2021-12-31 ENCOUNTER — Ambulatory Visit: Admit: 2021-12-31 | Discharge: 2022-01-01 | Payer: PRIVATE HEALTH INSURANCE

## 2021-12-31 ENCOUNTER — Encounter
Admit: 2021-12-31 | Discharge: 2022-01-01 | Payer: PRIVATE HEALTH INSURANCE | Attending: Certified Registered" | Primary: Certified Registered"

## 2021-12-31 MED ADMIN — propofoL (DIPRIVAN) injection: INTRAVENOUS | @ 17:00:00 | Stop: 2021-12-31

## 2021-12-31 MED ADMIN — promethazine (PHENERGAN) injection: INTRAVENOUS | @ 17:00:00 | Stop: 2021-12-31

## 2021-12-31 MED ADMIN — sodium chloride (NS) 0.9 % infusion: INTRAVENOUS | @ 17:00:00 | Stop: 2021-12-31

## 2021-12-31 MED ADMIN — succinylcholine (ANECTINE) injection: INTRAVENOUS | @ 17:00:00 | Stop: 2021-12-31

## 2021-12-31 MED ADMIN — caffeine citrate (CAFCIT) injection: INTRAVENOUS | @ 17:00:00 | Stop: 2021-12-31

## 2021-12-31 MED ADMIN — ketamine (KETALAR) injection: INTRAVENOUS | @ 17:00:00 | Stop: 2021-12-31

## 2021-12-31 MED ADMIN — ondansetron (ZOFRAN) injection: INTRAVENOUS | @ 17:00:00 | Stop: 2021-12-31

## 2021-12-31 MED ADMIN — ketorolac (TORADOL) injection: INTRAVENOUS | @ 17:00:00 | Stop: 2021-12-31

## 2022-01-01 ENCOUNTER — Telehealth: Admit: 2022-01-01 | Discharge: 2022-01-02 | Payer: PRIVATE HEALTH INSURANCE

## 2022-01-01 DIAGNOSIS — F32A Depression, unspecified depression type: Principal | ICD-10-CM

## 2022-01-01 DIAGNOSIS — F411 Generalized anxiety disorder: Principal | ICD-10-CM

## 2022-01-01 DIAGNOSIS — F959 Tic disorder, unspecified: Principal | ICD-10-CM

## 2022-01-01 MED ORDER — SERTRALINE 100 MG TABLET
ORAL_TABLET | Freq: Every day | ORAL | 2 refills | 30 days | Status: CP
Start: 2022-01-01 — End: 2022-04-01

## 2022-01-01 MED ORDER — CLONIDINE HCL 0.1 MG TABLET
ORAL_TABLET | Freq: Every evening | ORAL | 2 refills | 15 days | Status: CP
Start: 2022-01-01 — End: ?

## 2022-01-01 MED ORDER — HYDROXYZINE HCL 25 MG TABLET
ORAL_TABLET | Freq: Every day | ORAL | 2 refills | 30 days | Status: CP | PRN
Start: 2022-01-01 — End: ?

## 2022-01-01 NOTE — Unmapped (Signed)
Follow-up instructions:  -- Please continue taking your medications as prescribed for your mental health.   -- Do not make changes to your medications, including taking more or less than prescribed, unless under the supervision of your physician. Be aware that some medications may make you feel worse if abruptly stopped  -- Please refrain from using illicit substances, as these can affect your mood and could cause anxiety or other concerning symptoms.   -- Seek further medical care for any increase in symptoms or new symptoms such as thoughts of wanting to hurt yourself or hurt others.     Contact info:  Life-threatening emergencies: Call 911, the 988 suicide and crisis lifeline, or go to the nearest ER for medical or psychiatric attention.     Issues that need urgent attention but are not life threatening: Call the clinic outpatient front desk for assistance. [phone] for our [facility] clinic located at [address] [facility]. [phone] for our [facility] STEP clinic.    Non-urgent routine concerns and questions: Send a message through [facility] or call our clinic front desk.    Refill requests: Check with your pharmacy to initiate refill requests.    Regarding appointments:  - If you need to cancel your appointment, we ask that you call your clinic at least 24 hours before your scheduled appointment. [phone] for our [facility] clinic located at [address] [facility]. [phone] for our [facility] STEP clinic.  - If for any reason you arrive 15 minutes later than your scheduled appointment time, you may not be seen and your visit may be rescheduled.  - Please remember that we will not automatically reschedule missed appointments.  - If you miss two (2) appointments without letting us know in advance, you will likely be referred to a provider in your community.  - We will do our best to be on time. Sometimes an emergency will arise that might cause your clinician to be late. We will try to inform you of this when you check in for your appointment. If you wait more than 15 minutes past your appointment time without such notice, please speak with the front desk staff.    In the event of bad weather, the clinic staff will attempt to contact you, should your appointment need to be rescheduled. Additionally, you can call the Patient Weather Line [phone] for system-wide clinic status    For more information and reminders regarding clinic policies (these were provided when you were admitted to the clinic), please ask the front desk.

## 2022-01-03 NOTE — Unmapped (Signed)
[facility] Clinical Assessment & Refill Coordination Note    Alexis Burns, [place]: 01/28/1966  Phone: [phone] (home)     All above HIPAA information was verified with patient.     Was a Nurse, learning disability used for this call? No    Specialty Medication(s):   Infectious Disease: Xifaxan     Current Outpatient Medications   Medication Sig Dispense Refill    cloNIDine HCL (CATAPRES) 0.1 MG tablet Take 2 tablets (0.2 mg total) by mouth nightly. Monitor for dizziness with standing. 30 tablet 2    hydrOXYzine (ATARAX) 25 MG tablet Take 2 tablets (50 mg total) by mouth daily as needed for anxiety. 60 tablet 2    ondansetron (ZOFRAN-ODT) 8 MG disintegrating tablet Take 1 tablet (8 mg total) by mouth every eight (8) hours as needed for nausea.      promethazine (PHENERGAN) 12.5 MG suppository Insert 1 suppository (12.5 mg total) into the rectum every six (6) hours as needed for nausea for up to 7 days. 12 each 0    sertraline (ZOLOFT) 100 MG tablet Take 1 tablet (100 mg total) by mouth daily. 30 tablet 2    SUMAtriptan (IMITREX) 50 MG tablet TAKE 1 TABLET BY MOUTH EVERY 2 HOURS AS NEEDED FOR MIGRAINES      XIFAXAN 550 mg Tab Take 1 tablet (550 mg total) by mouth Two (2) times a day. 180 tablet 4     No current facility-administered medications for this visit.        Changes to medications: Philomina reports no changes at this time.    Allergies   Allergen Reactions    Haldol [Haloperidol Lactate] Anxiety    Cyclosporine Nausea And Vomiting    Tetracycline Nausea Only    Tramadol Itching       Changes to allergies: No    SPECIALTY MEDICATION ADHERENCE     Xifaxan 550 mg: approximately 10 days of medicine on hand     Medication Adherence    Patient reported X missed doses in the last month: 0  Specialty Medication: Xifaxan 550mg   Patient is on additional specialty medications: No  Any gaps in refill history greater than 2 weeks in the last 3 months: no  Demonstrates understanding of importance of adherence: yes  Informant: patient  Provider-estimated medication adherence level: good  Patient is at risk for Non-Adherence: No    Specialty medication(s) dose(s) confirmed: Regimen is correct and unchanged.     Are there any concerns with adherence? No    Adherence counseling provided? Not needed    CLINICAL MANAGEMENT AND INTERVENTION      Clinical Benefit Assessment:    Do you feel the medicine is effective or helping your condition? Yes    Clinical Benefit counseling provided? Not needed    Adverse Effects Assessment:    Are you experiencing any side effects? No    Are you experiencing difficulty administering your medicine? No    Quality of Life Assessment:      How many days over the past month did your Hepatic Encephalopathy  keep you from your normal activities? For example, brushing your teeth or getting up in the morning. 0    Have you discussed this with your provider? Not needed    Acute Infection Status:    Acute infections noted within Epic:  No active infections  Patient reported infection: None    Therapy Appropriateness:    Is therapy appropriate and patient progressing towards therapeutic  goals? Yes, therapy is appropriate and should be continued    DISEASE/MEDICATION-SPECIFIC INFORMATION      N/A    Other Infectious Disease: Not Applicable    PATIENT SPECIFIC NEEDS     Does the patient have any physical, cognitive, or cultural barriers? No    Is the patient high risk? No    Did the patient require a clinical intervention? No    Does the patient require physician intervention or other additional services (i.e., nutrition, smoking cessation, social work)? No    SOCIAL DETERMINANTS OF HEALTH     At the [facility], we have learned that life circumstances - like trouble affording food, housing, utilities, or transportation can affect the health of many of our patients.   That is why we wanted to ask: are you currently experiencing any life circumstances that are negatively impacting your health and/or quality of life? Patient declined to answer    Social Determinants of Health     Financial Resource Strain: Low Risk  (07/20/2021)    Overall Financial Resource Strain (CARDIA)     Difficulty of Paying Living Expenses: Not very hard   Internet Connectivity: Not on file   Food Insecurity: No Food Insecurity (07/20/2021)    Hunger Vital Sign     Worried About Running Out of Food in the Last Year: Never true     Ran Out of Food in the Last Year: Never true   Tobacco Use: Medium Risk (12/27/2021)    Patient History     Smoking Tobacco Use: Former     Smokeless Tobacco Use: Never     Passive Exposure: Not on file   Housing/Utilities: Low Risk  (07/20/2021)    Housing/Utilities     Within the past 12 months, have you ever stayed: outside, in a car, in a tent, in an overnight shelter, or temporarily in someone else's home (i.e. couch-surfing)?: No     Are you worried about losing your housing?: No     Within the past 12 months, have you been unable to get utilities (heat, electricity) when it was really needed?: No   Alcohol Use: Not At Risk (07/19/2021)    Alcohol Use     How often do you have a drink containing alcohol?: Never     How many drinks containing alcohol do you have on a typical day when you are drinking?: 1 - 2     How often do you have 5 or more drinks on one occasion?: Never   Transportation Needs: No Transportation Needs (07/20/2021)    PRAPARE - Transportation     Lack of Transportation (Medical): No     Lack of Transportation (Non-Medical): No   Substance Use: Not on file   Health Literacy: Low Risk  (12/28/2020)    Health Literacy     : Never   Physical Activity: Not on file   Interpersonal Safety: Not on file   Stress: Not on file   Intimate Partner Violence: Not At Risk (09/22/2018)    Humiliation, Afraid, Rape, and Kick questionnaire     Fear of Current or Ex-Partner: No     Emotionally Abused: No     Physically Abused: No     Sexually Abused: No   Depression: Not at risk (12/05/2020)    PHQ-2 PHQ-2 Score: 2   Social Connections: Unknown (09/22/2018)    Social Connection and Isolation Panel [NHANES]     Frequency of Communication with Friends and Family: Not  on file     Frequency of Social Gatherings with Friends and Family: Not on file     Attends Religious Services: Not on file     Active Member of Clubs or Organizations: Not on file     Attends Banker Meetings: Not on file     Marital Status: Married       Would you be willing to receive help with any of the needs that you have identified today? Not applicable       SHIPPING     Specialty Medication(s) to be Shipped:   Infectious Disease: Xifaxan    Other medication(s) to be shipped: No additional medications requested for fill at this time     Changes to insurance: No    Delivery Scheduled: Yes, Expected medication delivery date: 01/09/22.     Medication will be delivered via Next Day Courier to the confirmed prescription address in [facility]    The patient will receive a drug information handout for each medication shipped and additional FDA Medication Guides as required.  Verified that patient has previously received a Conservation officer, historic buildings and a Surveyor, mining.    The patient or caregiver noted above participated in the development of this care plan and knows that they can request review of or adjustments to the care plan at any time.      All of the patient's questions and concerns have been addressed.    Roderic Palau, PharmD   [facility] Specialty Pharmacist

## 2022-01-04 NOTE — Unmapped (Signed)
[facility]  Psychiatry   Established Patient E&M Service - Outpatient       Assessment:  Alexis Burns is a 56 y.o. female with a history of MDD and GAD who presents for follow-up evaluation. Alexis Burns reports ongoing stable mood. Of note, she has not requested medication adjustments in between appointments, which may be indicative of improved coping strategy utilization. She cites therapy with [name] to be beneficial, especially with navigating challenging relationships. Currently taking 0.2 mg of clonidine nightly which has helped with anxiety and sleep. Receiving ECT every four weeks. Denies SI and there are no acute safety concerns today. She does report ongoing nausea and vomiting and is currently prescribed Zofran. She believes the nausea and vomiting are related to liver issues despite reassurance by her provider at [facility]. She had an upper endoscopy performed 10/25, with biopsies remarkable for mild, chronic gastritis with erosion in the duodenum and stomach. It is possible that Xitlalic's nausea and vomiting may be related to a somatic symptom disorder, given the distress and anxiety that these symptoms have caused for her. May benefit from initiation of Remeron in future visits given antiemetic effects. Additionally, attributing the symptoms to her liver despite reassurance may be indicative of impaired reality testing as a component of a borderline personality organization. Will continue current medications, given mood stability. Follow up in two months, or sooner if needed. Patient agrees with plan.     Identifying Information: Alexis Burns is an ADTC patient since 2019 with a history of major depressive disorder and generalized anxiety disorder, with past hospitalization in 2017 at [facility]. In addition, her history of emotional dysregulation, poor distress tolerance, suspicion of others, and splitting appears consistent with borderline personality traits. Past medication trials include Prozac (stopped working), risperidone (brief trial), lithium (intolerable side effects), buspirone (unhelpful at 20mg  TID), duloxetine (90mg  well-tolerated but not helpful on its own), aripiprazole (as adjunct for duloxetine, eventually stopped due to tremors), Wellbutrin (anxiety, dry mouth), Seroquel (ineffective), Cytomel (dry mouth, joint pain). Over several years until late 2020, she was fairly stable on duloxetine 90mg  and Abilify 5mg . She used to work in a Engineer, petroleum pre-pandemic in a stressful role, took time off for a leg injury, and then when she went back to work she was fired. This was destabilizing, and it was closely followed by tibial tendon surgery in and then suspected COVID infection. By Feb 2021, mood remained unstable, but Abilify was causing significant shakiness even at tapered doses, and she discontinued it. She had an exacerbation of depression in May 2021 with her son's mental health as a major stressor, and after intolerance of increasing Cymbalta to 120mg , ineffectiveness of Seroquel, intolerance of Wellbutrin, and time-limited benefit of risperidone, in September 2021 she was admitted to the [facility] for suicidal ideation with a plan to shoot herself. They started Cytomel but she did not tolerate it. She noticed some abnormal tongue movements while on Abilify and these worsened after Abilify discontinuation. We considered the possibility of TD, but neurology evaluation labeled the tongue movements as tic-like, and they also diagnosed her with essential tremor. She started to show signs of liver dysfunction on her bloodwork, and in December 2021 we made the switch from duloxetine to Prozac. This was tolerated well and effective for mood, and mood has varied with family stressors. Both insomnia and hypersomnia have been problems for Ms. Hunger, and prns have been very helpful, by spring 2022 sleep had improved with more physical  activity. [name] was eventually restarted on Klonopin in Spring 2023 given severe anxiety/panic that caused her to be unable to leave her house for several weeks. Additionally, she experienced recurrence of depressive symptoms and passive suicidal ideation. Given how well she had done on an SNRI in the past, decision was made to cross taper Prozac to Pristiq. Given continued depressed mood, [name] was subsequently referred to interventional psychiatry ECT clinic in April 2023. Due to severity of symptoms, [name] was directly admitted to [facility] to start inpatient ECT and she was transitioned from Pristiq to sertraline. Klonopin was discontinued and Hydroxyzine PRN was started to target anxiety. She continued ECT as an outpatient.     Risk Assessment:  A suicide and violence risk assessment was performed at our last visit, and safety assessment remains uncharged: she is not at acute risk of harm to herself but is at chronic risk, and we will continue assessing safety at future evaluations.  Stressors: death of father in August 24, 2012 and mother in Aug 25, 2015, chronic depressive symptoms, financial stressors, unemployment, global pandemic and likely COVID19 infection, stressful relationships by son (improving).      Plan:  Problem: MDD - Borderline Personality Traits  Status: improved or improving  Interventions:  - Continue sertraline 100 mg daily   - Patient receiving ECT with [place], q4weeks  - Continue weekly therapy with [name]    Problem: GAD  Status of problem: improved or improving   - Continue hydroxyzine 50 mg daily as needed for anxiety, as patient reports the increased dose to be more beneficial  - Continue clonidine 0.2 mg nightly (pt self-increased from 0.1 to 0.2 mg). Consider transitioning to guanfacine given longer half-life.     Problem: Nausea and vomiting, r/o somatic symptom disorder  Status of problem:  new problem to this provider  Interventions:  - Pt continues to believe symptoms are related to her liver despite reassurance by [facility] - Upper endoscopy on 10/25 remarkable for mild, chronic gastritis w/ erosion in duodenum and stomach   - Prescribed Zofran by PCP   - Consider Remeron in future visits, given antiemetic effect    Problem: Abnormal tongue movements  Status of problem: chronic  Interventions:  - Neurology labeled her tongue movements as tic-related, and suggested therapies to help. We had considered if this was a symptoms of tardive dyskinesia as it worsened after Abilify discontinuation which can happen. It originally diminished with stopping artifical sweeteners, but then came back. Neurology's assessment is reassuring  - Clonidine, as above    Problem: Headache following ECT treatment  Status of problem:  chronic and stable  Interventions:  - Prescribed Imitrex by PCP for migraine, headache     Psychotherapy:  No billable psychotherapy service provided.    Patient has been given this writer's contact information as well as the [facility] urgent line number. The patient has been instructed to call 911 for emergencies.    Patient and plan of care were discussed with the Attending MD, Dr. Derrill Kay, who agrees with the above statement and plan.    Jiles Crocker, MD  [facility] PGY2    Subjective:     Psychiatric Chief Concern:  Follow-up psychiatric evaluation for mood    Interval History:  Reports she is doing pretty good. ECT is currently spaced out at [facility]. Says yesterday she had ECT and it was a rough one with a headache that has since resolved. Says she is taking 0.2 mg clonidine nightly which  helps me relax, turn my mind off, and sleep better. Overall sleeping well. Reports ongoing nausea and vomiting which she believes is a result of liver issues, despite reassurance from her provider at [facility]. Denies SI. Says therapy with [name] is really good. Therapy has helped her work through difficult relationships including cutting ties.     Objective:    Mental Status Exam:  Appearance:    Appears stated age, Well nourished and Clean/Neat, sitting in chair in front of screen   Motor:   No abnormal movements   Speech/Language:    Normal rate, volume, tone, fluency and Language intact, well formed   Mood:   pretty good   Affect:   Full, euthymic, mood congruent   Thought process and Associations:   Logical, linear, clear, coherent, goal directed   Abnormal/psychotic thought content:     Denies SI/HI   Perceptual disturbances:     Behavior not concerning for response to internal stimuli     Other:   -        The patient reports they are physically located in [place] and is currently: at home. I conducted a audio/video visit. I spent  60m 07s on the video call with the patient. I spent an additional 30 minutes on pre- and post-visit activities on the date of service .

## 2022-01-07 DIAGNOSIS — K7682 Hepatic encephalopathy (CMS-HCC): Principal | ICD-10-CM

## 2022-01-07 DIAGNOSIS — K7460 Unspecified cirrhosis of liver: Principal | ICD-10-CM

## 2022-01-08 MED FILL — XIFAXAN 550 MG TABLET: ORAL | 30 days supply | Qty: 60 | Fill #6

## 2022-01-08 NOTE — Unmapped (Signed)
I was available. I discussed the case with the resident at length. I reviewed the resident???s note and edited where appropriate. I agree with the resident???s findings and plan.     Russ Halo, MD

## 2022-01-10 ENCOUNTER — Telehealth: Admit: 2022-01-10 | Discharge: 2022-01-11 | Payer: PRIVATE HEALTH INSURANCE | Attending: Clinical | Primary: Clinical

## 2022-01-10 DIAGNOSIS — F419 Anxiety disorder, unspecified: Principal | ICD-10-CM

## 2022-01-10 DIAGNOSIS — F332 Major depressive disorder, recurrent severe without psychotic features: Principal | ICD-10-CM

## 2022-01-10 NOTE — Unmapped (Signed)
I spent 45 minutes on video with the patient. I spent an additional 0 minutes on pre- and post-visit activities.  The patient was physically located in [place] or a state in which I am permitted to provide care. The patient understood that s/he may incur co-pays and cost sharing and agreed to the telemedicine visit. The visit was completed via phone and/or video, which was appropriate and reasonable under the circumstances given the patient's presentation at the time.     The patient has been advised of the potential risks and limitations of this mode of treatment (including, but not limited to, the absence of in-person examination) and has agreed to be treated using telemedicine. The patient's/patient's family's questions regarding telemedicine have been answered.      If the phone/video visit was completed in an ambulatory setting, the patient has also been advised to contact their provider???s office for worsening conditions and seek emergency medical treatment and/or call 911 if the patient deems necessary.        [facility]       STEP Patient Psychotherapy    Name: Alexis Burns  Date: 01/10/2022  MRN: [id number]  DOB: 16-Sep-1965  PCP: Aura Camps, FNP    Service Duration:  45 minutes        Service: Outpatient Therapy- Individual   [x]  Video     Date of Last Encounter:  Visit date not found    Mental Status/Behavioral Observations  Affect:  Mood congruent   Mood:   anxious   Thought Process:  Goal directed and Linear   Behavior:   Cooperative and Direct eye contact   Self Harm: none and future oriented     Purpose of contact:    [x]   Continue to address treatment goals  []   Treatment Planning/Treatment progress review []  Discharge Planning     Interventions Provided:     [x]   CBT  []   Interpersonal Process Therapy []  Acceptance & Commitment Therapy (ACT)  []  DBT  []  Motivational Interviewing  []  Behavioral Activation []  Psycho-Education  []  Exposure Therapy  []  Trauma-Informed CBT [x]  Person Centered  [x]  Supportive Therapy    Patient Response/Progress:  Identifying Information: Alexis Burns is an ADTC patient since 2019 with a history of depression and anxiety, with past hospitalization in 2017 at [facility]. Over several years until late 2020, she was fairly stable on duloxetine 90mg  and Abilify 5mg . She used to work in a Engineer, petroleum pre-pandemic in a stressful role, took time off for a leg injury, and then when she went back to work she was fired. This was destabilizing, and it was closely followed by tibial tendon surgery in and then suspected COVID infection. By Feb 2021, mood remained unstable. She had an exacerbation of depression in May 2021 with her son's mental health as a major stressor, and after intolerance of increasing Cymbalta to 120mg , ineffectiveness of Seroquel, intolerance of Wellbutrin, and time-limited benefit of risperidone.In 2021 she was admitted to the [facility] for suicidal ideation with a plan to shoot herself. Medication was tolerated well and effective for mood, and mood has varied with family stressors. Both insomnia and hypersomnia have been problems for Alexis Burns, and prns have been variably helpful, by spring 2022 sleep had improved with more physical activity.  11/15/21 POC Target Outcomes: reduction in symptoms, including I can't turn my brain off, depression, anxiety, and aggravation/anger, increased independent functioning, specifically not being spoiled and dependent  on her husband, reduction in aggresive ideation, employment, improved community integration, including avoiding isolation, and stop focusing on the negative.and stop taking things personally focus on good health.  Treatment/Process:   Patient joins today by video for individual therapy. This session was conducted as a clinical therapy visit to assess acute safety, monitor ongoing clinical treatment plan, and provide continuity to outpatient treatment in the setting of Covid19 Pandemic.  Patient appropriately engaged in the discussion and was able to follow the course of the conversation; presenting anxious, without indication of psychosis, cooperatively, and future oriented.   Clinician facilitated discussion of pt status, concerns, and /review/development of POC with session content including:  Pt reports having gotten good news noting SS had been approved for disability, noting some confusion around coordinating the benefits, now we're just waiting and discussion looking at half empty.   Pt reports I have been down in the dumps trying to accept the fact that her sister just doesn't care,  Pt reports on a text she sent to her son, Alexis Burns, of which she reports I feel rejected.  Pt reports oh an upper GI that found stomach erosion noting that is what had caused the vomiting which has slacked off.  Pt reports on having put down her 55 year old and now having a house full of sick dogs noting financial complications and emotional implications.  Pt reports on a pending holiday trip.  Pt reflects on her ECT impacting her memories only of movies and vacations.  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    RISK ASSESSMENT: A suicide and violence risk assessment was performed as part of this evaluation.  While future psychiatric events cannot be accurately predicted, the patient does not currently require acute inpatient psychiatric care and does not currently meet [facility] involuntary commitment criteria.      Diagnoses:   Severe recurrent major depression 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) [F33.2]   Anxiety [F41.9]    [name], LCSW  [place]   01/10/22    Debroah Loop, LCSW  10:05 AM

## 2022-01-22 DIAGNOSIS — F411 Generalized anxiety disorder: Principal | ICD-10-CM

## 2022-01-22 DIAGNOSIS — F959 Tic disorder, unspecified: Principal | ICD-10-CM

## 2022-01-22 MED ORDER — HYDROXYZINE HCL 25 MG TABLET
ORAL_TABLET | Freq: Every day | ORAL | 2 refills | 30 days | Status: CP | PRN
Start: 2022-01-22 — End: ?

## 2022-01-22 MED ORDER — CLONIDINE HCL 0.1 MG TABLET
ORAL_TABLET | Freq: Every evening | ORAL | 2 refills | 30 days | Status: CP
Start: 2022-01-22 — End: ?

## 2022-01-30 ENCOUNTER — Encounter
Admit: 2022-01-30 | Discharge: 2022-01-31 | Payer: PRIVATE HEALTH INSURANCE | Attending: Anesthesiology | Primary: Anesthesiology

## 2022-01-30 ENCOUNTER — Ambulatory Visit: Admit: 2022-01-30 | Discharge: 2022-01-31 | Payer: PRIVATE HEALTH INSURANCE

## 2022-01-30 DIAGNOSIS — R112 Nausea with vomiting, unspecified: Principal | ICD-10-CM

## 2022-01-30 MED ADMIN — ketorolac (TORADOL) injection: INTRAVENOUS | @ 15:00:00 | Stop: 2022-01-30

## 2022-01-30 MED ADMIN — ondansetron (ZOFRAN) injection: INTRAVENOUS | @ 15:00:00 | Stop: 2022-01-30

## 2022-01-30 MED ADMIN — sodium chloride (NS) 0.9 % infusion: INTRAVENOUS | @ 15:00:00 | Stop: 2022-01-30

## 2022-01-30 MED ADMIN — succinylcholine (ANECTINE) injection: INTRAVENOUS | @ 15:00:00 | Stop: 2022-01-30

## 2022-01-30 MED ADMIN — caffeine citrate (CAFCIT) injection: INTRAVENOUS | @ 15:00:00 | Stop: 2022-01-30

## 2022-01-30 MED ADMIN — promethazine (PHENERGAN) injection: INTRAVENOUS | @ 15:00:00 | Stop: 2022-01-30

## 2022-01-30 MED ADMIN — ketamine (KETALAR) injection: INTRAVENOUS | @ 15:00:00 | Stop: 2022-01-30

## 2022-01-30 MED ADMIN — Propofol (DIPRIVAN) injection: INTRAVENOUS | @ 15:00:00 | Stop: 2022-01-30

## 2022-01-30 NOTE — Unmapped (Signed)
[facility] E[facility]               Procedure Note                                           Requesting Attending Physician: Not In System Provider  Admit Date: 01/30/2022     Service Type: Inpatient  Requesting Attending Physician: Not In System Provider  Service requesting consult: Psychiatry  Consulting service: Psychiatry  Admit Date: 01/30/2022  Service Date: January 30, 2022    Time out was taken with staff to confirm correct patient and correct procedure to be performed.    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 Much improved    Beck Depression Inventory Total Score:     Baseline: 51  Most recent:  30    Mini-Mental Status ExamTotal Score:     Baseline: 25  Most recent: 30    MEDICAL INFORMATION:    Allergies: Haldol [haloperidol lactate], Cyclosporine, Tetracycline, and Tramadol    Medical History: See ECT Consult    Surgical History: See ECT Consult    VITAL SIGNS:      Pre-procedure:  174/93  100    Post-stimulus:   191/96  88      THYMATRON:  ECT # Thymatron Settings (%) Modification Seizure   Length Cuff Duration  (sec) EEG Duration  (sec)   1 100 Mild Adequate 38 66       Medications given during procedure:   Current Outpatient Medications   Medication Sig Dispense Refill   ??? cloNIDine HCL (CATAPRES) 0.1 MG tablet Take 2 tablets (0.2 mg total) by mouth nightly. Monitor for dizziness with standing. 60 tablet 2   ??? hydrOXYzine (ATARAX) 25 MG tablet Take 2 tablets (50 mg total) by mouth daily as needed for anxiety. 60 tablet 2   ??? ondansetron (ZOFRAN-ODT) 8 MG disintegrating tablet Take 1 tablet (8 mg total) by mouth every eight (8) hours as needed for nausea.     ??? sertraline (ZOLOFT) 100 MG tablet Take 1 tablet (100 mg total) by mouth daily. 30 tablet 2   ??? SUMAtriptan (IMITREX) 50 MG tablet TAKE 1 TABLET BY MOUTH EVERY 2 HOURS AS NEEDED FOR MIGRAINES     ??? XIFAXAN 550 mg Tab Take 1 tablet (550 mg total) by mouth Two (2) times a day. 180 tablet 4     No current facility-administered medications for this encounter.     Facility-Administered Medications Ordered in Other Encounters   Medication Dose Route Frequency Provider Last Rate Last Admin   ??? caffeine citrate (CAFCIT) injection   Intravenous PRN (once a day) Visscher, Christene Lye, CRNA   120 mg at 01/30/22 1019   ??? ketamine (KETALAR) injection   Intravenous PRN (once a day) Coila, Yarbrough, CRNA   60 mg at 01/30/22 1022   ??? ketorolac (TORADOL) injection   Intravenous PRN (once a day) Oren Section A, CRNA   15 mg at 01/30/22 1029   ??? ondansetron (ZOFRAN) injection   Intravenous PRN (once a day) Lobonc, Ammie Ferrier, MD  4 mg at 01/30/22 1022   ??? promethazine (PHENERGAN) injection   Intravenous PRN (once a day) Lobonc, Ammie Ferrier, MD   12.5 mg at 01/30/22 1012   ??? Propofol (DIPRIVAN) injection   Intravenous PRN (once a day) Ariabella, Kornblum, CRNA   80 mg at 01/30/22 1022   ??? succinylcholine (ANECTINE) injection   Intravenous PRN (once a day) Shannikia, Lowery, CRNA   120 mg at 01/30/22 1024        labetelol 5 mg IV    Neck Collar Used:  Yes     PROGRESS NOTE:     Alexis Burns is a 56 y.o. female who presents for outpatient continuation ECT  3 weeks after her last treatment, her first at this interval. She had an upper GI earlier this week showing gastric erosions    . States that her mental health has taken a backseat to her physical health problems - her liver is not filtering toxins anymore, has to take laxatives and antibiotics. Experiences a lot of vomiting. This AM has a HA from being dehydrated, feels nauseous. Feels like she does not have time to think about how she is feeling mentally. Sees hepatology here at [facility]. Discuss plan to pre-treat with phenergan. She also asks for something for HA. She hopes to spread out her next treatment to 3 weeks.     Pt with more muscle ache and ha after last tx. willl avoid tylenol due to liver . Discussed toradol with pt and anesthesia and felt one dose once a month would be alright    Mental Status Exam:  Appearance:    Well nourished and Well developed   Behavior:  Calm and Cooperative   Motor:   No abnormal movements   Speech/Language:    Normal rate, volume, tone, fluency and Language intact, well formed   Mood:   okay, melancholy   Affect:   Calm, euthymic, cooperative, bright   Thought process:   Logical, linear, clear, coherent, goal directed   Thought content:    Denies suicidal ideation   Perceptual disturbances:     Denies auditory and visual hallucinations, behavior not concerning for response to internal stimuli   Orientation:   Oriented to person, place, time, and general circumstances   Attention:   Able to fully attend without fluctuations in consciousness   Concentration:   Able to fully concentrate and attend     Physical Exam:   Gen: NAD  Pulm: No increased work of breathing  Neuro: No tremors, tics, or abnormal movements    Assessment and Plan:   Mood Disorder: Maj Depress D/O, 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     Seizure was of adequate length, fair amplitude, fair morphology, and poor post-ictal suppression.    Interventions Today:    ??? Medication : caffeine  120mg , labetelol 5 mg IV  ??? Energy adjustments: none    Post-Treatment Complications: Hypertension treated with labetalol 5mg      Recommendations for Future Treatments:    ??? Medication adjustments:  none  ??? Energy adjustments:    100%    ??? Frequency: return in 3 weeks then increase as tolerated    Next Treatment Date: 02/20/22    Meds to take prior to ECT: None    Special Instructions: none    Assisted by: Marygrace Drought, MD    Lorelee Cover, MD

## 2022-02-04 NOTE — Unmapped (Signed)
Alexis Burns has been contacted in regards to their refill of Xifaxan. At this time, they have declined refill due to patient having plenty doses remaining. Refill assessment call date has been updated per the patient's request.

## 2022-02-07 ENCOUNTER — Telehealth: Admit: 2022-02-07 | Discharge: 2022-02-08 | Payer: PRIVATE HEALTH INSURANCE | Attending: Clinical | Primary: Clinical

## 2022-02-07 DIAGNOSIS — F419 Anxiety disorder, unspecified: Principal | ICD-10-CM

## 2022-02-07 DIAGNOSIS — F332 Major depressive disorder, recurrent severe without psychotic features: Principal | ICD-10-CM

## 2022-02-07 NOTE — Unmapped (Signed)
Sherese can you pls call pt and get more information about the situation?

## 2022-02-07 NOTE — Unmapped (Signed)
I spent 55 minutes on video with the patient. I spent an additional 0 minutes on pre- and post-visit activities.  The patient was physically located in [place] or a state in which I am permitted to provide care. The patient understood that s/he may incur co-pays and cost sharing and agreed to the telemedicine visit. The visit was completed via phone and/or video, which was appropriate and reasonable under the circumstances given the patient's presentation at the time.     The patient has been advised of the potential risks and limitations of this mode of treatment (including, but not limited to, the absence of in-person examination) and has agreed to be treated using telemedicine. The patient's/patient's family's questions regarding telemedicine have been answered.      If the phone/video visit was completed in an ambulatory setting, the patient has also been advised to contact their provider???s office for worsening conditions and seek emergency medical treatment and/or call 911 if the patient deems necessary.        [facility]       STEP Patient Psychotherapy    Name: Alexis Burns  Date: 02/07/2022  MRN: [id number]  DOB: 1965/12/24  PCP: Alexis Camps, FNP    Service Duration:  55 minutes        Service: Outpatient Therapy- Individual   [x]  Video     Date of Last Encounter:  Visit date not found    Mental Status/Behavioral Observations  Affect:  Mood congruent   Mood:   anxious   Thought Process:  Goal directed and Linear   Behavior:   Cooperative and Direct eye contact   Self Harm: none and future oriented     Purpose of contact:    [x]   Continue to address treatment goals  []   Treatment Planning/Treatment progress review []  Discharge Planning     Interventions Provided:     [x]   CBT  []   Interpersonal Process Therapy []  Acceptance & Commitment Therapy (ACT)  []  DBT  []  Motivational Interviewing  []  Behavioral Activation []  Psycho-Education  []  Exposure Therapy  []  Trauma-Informed CBT [x]  Person Centered  [x]  Supportive Therapy    Patient Response/Progress:  Identifying Information: Alexis Burns is an ADTC patient since 2019 with a history of depression and anxiety, with past hospitalization in 2017 at [facility]. Over several years until late 2020, she was fairly stable.  She used to work in a Engineer, [employer] pre-pandemic in a stressful role, took time off for a leg injury, and then when she went back to work she was fired. She had an exacerbation of depression in May 2021 with her son's mental health as a major stressor, during 2021 she was admitted to the [facility] Crisis Unit for suicidal ideation with a plan to shoot herself.   11/15/21 POC Target Outcomes: reduction in symptoms, including I can't turn my brain off, depression, anxiety, and aggravation/anger, increased independent functioning, specifically not being spoiled and dependent on her husband, reduction in aggresive ideation, employment, improved community integration, including avoiding isolation, and stop focusing on the negative.and stop taking things personally focus on good health.  Treatment/Process:   Patient joins today by video for individual therapy. This session was conducted as a clinical therapy visit to assess acute safety, monitor ongoing clinical treatment plan, and provide continuity to outpatient treatment in the setting of Covid19 Pandemic.  Patient appropriately engaged in the discussion and was able to follow the course of the conversation;  presenting anxious, without indication of psychosis, cooperatively, and future oriented.   Clinician facilitated discussion of pt status, concerns, and /review/development of POC with session content including:  Pt reports that she is highly discouraged about doctor situation about her liver noting she didn't know what else to do with me and was going to refer her out but hasn't. Pt reports frustration I've jumped through all the hoops.  Pt reports on friends noting on a situation regarding neighbors and that I've given it too much head space.    Pt reports taking a gummi Delta 8 daily for nausea which it will stop it all day gets me pretty high, gets me relaxed, gets me hungry.I'll eat about anything.  Pt reports having received a check for her approved disability noting it will continue.  Pt reports on her son Alexis Burns.who is at this time flying to [place] to get married. Pt reflects on I wish him well but he's really not in my life anymore.  Pt reflects on hx of difficulties from my mouth with recent recognition I look and act like a different woman. Pt discusses I grew up with worrying about everything if daddy was in the room you had better be aware of everything.  Pt reflects on her relationships with her sisters, sharing I don't remember why exactly she had it out with [name] sharing thoughts, noting she shows me no mercy whatsoever.   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  Clinician processed session content with pt.    RISK ASSESSMENT: A suicide and violence risk assessment was performed as part of this evaluation.  While future psychiatric events cannot be accurately predicted, the patient does not currently require acute inpatient psychiatric care and does not currently meet [facility] involuntary commitment criteria.      Diagnoses:   Severe recurrent major depression 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) [F33.2]   Anxiety [F41.9]    Alexis Hoehn, LCSW   [facility]   02/07/22    Alexis Loop, LCSW  10:05 AM

## 2022-02-08 MED ORDER — SUMATRIPTAN 50 MG TABLET
ORAL_TABLET | ORAL | 0 refills | 5 days | Status: CP | PRN
Start: 2022-02-08 — End: ?
  Filled 2022-02-15: qty 18, 25d supply, fill #0

## 2022-02-10 ENCOUNTER — Emergency Department
Admit: 2022-02-10 | Discharge: 2022-02-11 | Disposition: A | Discharge status: Home / Self Care | Payer: PRIVATE HEALTH INSURANCE | Attending: Emergency Medicine

## 2022-02-10 ENCOUNTER — Ambulatory Visit
Admit: 2022-02-10 | Discharge: 2022-02-11 | Disposition: A | Discharge status: Home / Self Care | Payer: PRIVATE HEALTH INSURANCE | Attending: Emergency Medicine

## 2022-02-10 DIAGNOSIS — R1110 Vomiting, unspecified: Principal | ICD-10-CM

## 2022-02-10 LAB — COMPREHENSIVE METABOLIC PANEL
ALBUMIN: 3.9 g/dL (ref 3.4–5.0)
ALKALINE PHOSPHATASE: 80 U/L (ref 46–116)
ALT (SGPT): 26 U/L (ref 10–49)
ANION GAP: 9 mmol/L (ref 5–14)
AST (SGOT): 33 U/L (ref <=34)
BILIRUBIN TOTAL: 3.2 mg/dL — ABNORMAL HIGH (ref 0.3–1.2)
BLOOD UREA NITROGEN: 17 mg/dL (ref 9–23)
BUN / CREAT RATIO: 22
CALCIUM: 9.4 mg/dL (ref 8.7–10.4)
CHLORIDE: 111 mmol/L — ABNORMAL HIGH (ref 98–107)
CO2: 24 mmol/L (ref 20.0–31.0)
CREATININE: 0.78 mg/dL
EGFR CKD-EPI (2021) FEMALE: 90 mL/min/{1.73_m2} (ref >=60)
GLUCOSE RANDOM: 122 mg/dL (ref 70–179)
POTASSIUM: 3.7 mmol/L (ref 3.4–4.8)
PROTEIN TOTAL: 6.9 g/dL (ref 5.7–8.2)
SODIUM: 144 mmol/L (ref 135–145)

## 2022-02-10 LAB — AMMONIA: AMMONIA: <10 umol/L — ABNORMAL LOW (ref 11–32)

## 2022-02-10 LAB — CBC W/ AUTO DIFF
BASOPHILS ABSOLUTE COUNT: 0 10*9/L (ref 0.0–0.1)
BASOPHILS RELATIVE PERCENT: 0.4 %
EOSINOPHILS ABSOLUTE COUNT: 0.1 10*9/L (ref 0.0–0.5)
EOSINOPHILS RELATIVE PERCENT: 0.8 %
HEMATOCRIT: 44.2 % — ABNORMAL HIGH (ref 34.0–44.0)
HEMOGLOBIN: 14.6 g/dL (ref 11.3–14.9)
LYMPHOCYTES ABSOLUTE COUNT: 0.5 10*9/L — ABNORMAL LOW (ref 1.1–3.6)
LYMPHOCYTES RELATIVE PERCENT: 8.3 %
MEAN CORPUSCULAR HEMOGLOBIN CONC: 33.1 g/dL (ref 32.0–36.0)
MEAN CORPUSCULAR HEMOGLOBIN: 28.5 pg (ref 25.9–32.4)
MEAN CORPUSCULAR VOLUME: 86.1 fL (ref 77.6–95.7)
MEAN PLATELET VOLUME: 7.9 fL (ref 6.8–10.7)
MONOCYTES ABSOLUTE COUNT: 0.4 10*9/L (ref 0.3–0.8)
MONOCYTES RELATIVE PERCENT: 6.3 %
NEUTROPHILS ABSOLUTE COUNT: 5.4 10*9/L (ref 1.8–7.8)
NEUTROPHILS RELATIVE PERCENT: 84.2 %
NUCLEATED RED BLOOD CELLS: 0 /100{WBCs} (ref <=4)
PLATELET COUNT: 65 10*9/L — ABNORMAL LOW (ref 150–450)
RED BLOOD CELL COUNT: 5.13 10*12/L (ref 3.95–5.13)
RED CELL DISTRIBUTION WIDTH: 14.2 % (ref 12.2–15.2)
WBC ADJUSTED: 6.4 10*9/L (ref 3.6–11.2)

## 2022-02-10 LAB — URINALYSIS WITH MICROSCOPY WITH CULTURE REFLEX
BILIRUBIN UA: NEGATIVE
GLUCOSE UA: NEGATIVE
LEUKOCYTE ESTERASE UA: NEGATIVE
NITRITE UA: NEGATIVE
PH UA: 5.5 (ref 5.0–9.0)
RBC UA: 4 /HPF (ref <=4)
SPECIFIC GRAVITY UA: 1.028 (ref 1.003–1.030)
SQUAMOUS EPITHELIAL: 5 /HPF (ref 0–5)
UROBILINOGEN UA: 2 — AB
WBC UA: 6 /HPF — ABNORMAL HIGH (ref 0–5)

## 2022-02-10 LAB — LIPASE: LIPASE: 33 U/L (ref 12–53)

## 2022-02-10 LAB — ETHANOL: ETHANOL: <10 mg/dL (ref <=10.0)

## 2022-02-10 LAB — HIGH SENSITIVITY TROPONIN I - SINGLE: HIGH SENSITIVITY TROPONIN I: <3 ng/L (ref <=34)

## 2022-02-10 LAB — MAGNESIUM: MAGNESIUM: 1.9 mg/dL (ref 1.6–2.6)

## 2022-02-10 MED ORDER — DICYCLOMINE 20 MG TABLET
ORAL_TABLET | Freq: Two times a day (BID) | ORAL | 0 refills | 10 days | Status: CP
Start: 2022-02-10 — End: 2022-02-20

## 2022-02-10 MED ORDER — FAMOTIDINE 20 MG TABLET
ORAL_TABLET | Freq: Two times a day (BID) | ORAL | 0 refills | 10 days | Status: CP
Start: 2022-02-10 — End: 2022-02-20

## 2022-02-10 MED ORDER — OXYCODONE 5 MG TABLET
ORAL_TABLET | Freq: Three times a day (TID) | ORAL | 0 refills | 2 days | Status: CP | PRN
Start: 2022-02-10 — End: 2022-02-15

## 2022-02-10 MED ORDER — PROMETHAZINE 25 MG TABLET
ORAL_TABLET | Freq: Three times a day (TID) | ORAL | 0 refills | 7 days | Status: CP | PRN
Start: 2022-02-10 — End: 2022-02-17

## 2022-02-10 MED ADMIN — sucralfate (CARAFATE) oral suspension: 1 g | ORAL | @ 15:00:00 | Stop: 2022-02-10

## 2022-02-10 MED ADMIN — iohexoL (OMNIPAQUE) 350 mg iodine/mL solution 100 mL: 100 mL | INTRAVENOUS | @ 20:00:00 | Stop: 2022-02-10

## 2022-02-10 MED ADMIN — aluminum-magnesium hydroxide-simethicone (MAALOX MAX) 80-80-8 mg/mL oral suspension: 30 mL | ORAL | @ 15:00:00 | Stop: 2022-02-10

## 2022-02-10 MED ADMIN — morphine 4 mg/mL injection 4 mg: 4 mg | INTRAVENOUS | @ 19:00:00 | Stop: 2022-02-10

## 2022-02-10 MED ADMIN — promethazine (PHENERGAN) 12.5 mg in sodium chloride 0.9 % 25 mL IVPB (premix): 12.5 mg | INTRAVENOUS | Stop: 2022-02-10

## 2022-02-10 MED ADMIN — sodium chloride 0.9% (NS) bolus 1,000 mL: 1000 mL | INTRAVENOUS | @ 11:00:00 | Stop: 2022-02-10

## 2022-02-10 MED ADMIN — promethazine (PHENERGAN) 12.5 mg in sodium chloride 0.9 % 25 mL IVPB (premix): 12.5 mg | INTRAVENOUS | @ 13:00:00 | Stop: 2022-02-10

## 2022-02-10 MED ADMIN — famotidine (PF) (PEPCID) injection 20 mg: 20 mg | INTRAVENOUS | @ 13:00:00 | Stop: 2022-02-10

## 2022-02-10 MED ADMIN — ondansetron (ZOFRAN) injection 4 mg: 4 mg | INTRAVENOUS | @ 11:00:00 | Stop: 2022-02-10

## 2022-02-10 MED ADMIN — morphine injection 2 mg: 2 mg | INTRAVENOUS | @ 14:00:00 | Stop: 2022-02-10

## 2022-02-10 NOTE — Unmapped (Signed)
[facility]  Emergency Department Provider Note      ED Clinical Impression     Final diagnoses:   Vomiting, unspecified vomiting type, unspecified whether nausea present (Primary)       Initial Impression, ED Course, Assessment and Plan     Time seen: February 10, 2022 7:23 AM   Alexis Burns is a 56 y.o. female with a PMH of pre-diabetes, HTN, HLD, NASH cirrhosis, arthritis, and asthma presenting with nausea, NBNB emesis, and upper abdominal pain.    ED Course as of 02/13/22 1911   Wynelle Link Feb 10, 2022   1229 Patient had some improvement after the pain medicine, but reports continued abdominal pain.  I will order CT abdomen pelvis, and additional pain medicine this time.         Medical Decision Making  Patient presenting with nausea, vomiting, and upper abdominal pain. She has a history of similar symptoms ongoing for several months. On exam, slight epigastric TTP. Abdomen is soft. Concern for gastritis possibly related to Ozempic. She now has worsening nausea, vomiting, and upper abdominal pain in the setting of recent sick contact with husband. Differential diagnosis includes gastroenteritis vs dehydration vs pancreatitis vs electrolyte abnormalities. Unlikely to be bowel obstruction or other infectious etiology. Less likely ACS. Plan to check labs, EKG and will give IVF.      Amount and/or Complexity of Data Reviewed  Independent Historian: parent and spouse    Patient signed out awaiting CT scan.    Social Determinants of Health with Concerns     Internet Connectivity: Not on file   Tobacco Use: Medium Risk (12/27/2021)    Patient History     Smoking Tobacco Use: Former     Smokeless Tobacco Use: Never     Passive Exposure: Not on file   Substance Use: Not on file   Physical Activity: Not on file   Interpersonal Safety: Not on file   Stress: Not on file   Social Connections: Unknown (09/22/2018)    Social Connection and Isolation Panel [NHANES]     Frequency of Communication with Friends and Family: Not on file     Frequency of Social Gatherings with Friends and Family: Not on file     Attends Religious Services: Not on file     Active Member of Clubs or Organizations: Not on file     Attends Banker Meetings: Not on file     Marital Status: Married         ____________________________________________        History     Chief Complaint  Emesis      HPI   Alexis Burns is a 56 y.o. female with a PMH of pre-diabetes, HTN, HLD, NASH cirrhosis, arthritis, and asthma who presents to the [facility] Emergency Department for emesis. The patient reports 24 hours of nausea, NBNB emesis, and upper abdominal pain. She had a fever (Tmax of 110.9 F). Positive sick exposure to husband who had similar symptoms last week. She has a 6 month ongoing history of nausea and vomiting which she is followed by GI. No hematochezia, melena, hematemesis, shortness of breath, or chest pain.      Past Medical History  Past Medical History:   Diagnosis Date    ADD (attention deficit disorder)     Anxiety Since childhood    Arthritis 10 years    Asthma     cough variant asthma per pt report    Binge eating disorder  Cirrhosis (CMS-HCC)     Depression 04/11/2016    Hepatic encephalopathy (CMS-HCC) 2023    Hyperlipidemia 20 years    Not interested in meds    Hypertension     Obesity Since childhood       Past Surgical History  Past Surgical History:   Procedure Laterality Date    BREAST BIOPSY Left     many biopsies    BREAST CYST ASPIRATION Right 2003    abcess    BREAST EXCISIONAL BIOPSY Left 2014ish    CESAREAN SECTION      CHOLECYSTECTOMY      HERNIA REPAIR  2020    HYSTERECTOMY  2001    partial    OOPHORECTOMY      still has one ovary    PR COLONOSCOPY W/BIOPSY SINGLE/MULTIPLE N/A 04/19/2020    Procedure: COLONOSCOPY, FLEXIBLE, PROXIMAL TO SPLENIC FLEXURE; WITH BIOPSY, SINGLE OR MULTIPLE;  Surgeon: Luanne Bras, MD;  Location: [facility]ED[facility];  Service: Gastroenterology    PR REPAIR INCISIONAL HERNIA,STRANG Midline 09/25/2018    Procedure: REPAIR INITIAL INCISIONAL OR VENTRAL HERNIA; INCARCERATED OR STRANGULATED;  Surgeon: Colon Branch, MD;  Location: [facility];  Service: General Surgery    PR SHLDR ARTHROSCOP,SURG,W/ROTAT CUFF REPR Left 09/25/2020    Procedure: ARTHROSCOPY, SHOULDER, SURGICAL; WITH ROTATOR CUFF REPAIR;  Surgeon: Tomasa Rand, MD;  Location: [facility];  Service: Orthopedics    PR UPPER GI ENDOSCOPY,BIOPSY N/A 12/26/2021    Procedure: UGI ENDOSCOPY; WITH BIOPSY, SINGLE OR MULTIPLE;  Surgeon: Kela Millin, MD;  [facility]: [facility];  Service: Gastroenterology    PR UPPER GI ENDOSCOPY,DIAGNOSIS N/A 04/19/2020    Procedure: UGI ENDO, INCLUDE ESOPHAGUS, STOMACH, & DUODENUM &/OR JEJUNUM; DX W/WO COLLECTION SPECIMN, BY BRUSH OR WASH;  Surgeon: Luanne Bras, MD;  Location: [facility];  Service: Gastroenterology    WISDOM TOOTH EXTRACTION         Medications  No current facility-administered medications for this encounter.    Current Outpatient Medications:     cloNIDine HCL (CATAPRES) 0.1 MG tablet, Take 2 tablets (0.2 mg total) by mouth nightly. Monitor for dizziness with standing., Disp: 60 tablet, Rfl: 2    hydrOXYzine (ATARAX) 25 MG tablet, Take 2 tablets (50 mg total) by mouth daily as needed for anxiety., Disp: 60 tablet, Rfl: 2    ondansetron (ZOFRAN-ODT) 8 MG disintegrating tablet, Take 1 tablet (8 mg total) by mouth every eight (8) hours as needed for nausea., Disp: , Rfl:     sertraline (ZOLOFT) 100 MG tablet, Take 1 tablet (100 mg total) by mouth daily., Disp: 30 tablet, Rfl: 2    sumatriptan (IMITREX) 50 MG tablet, Take 1 tablet (50 mg total) by mouth every two (2) hours as needed for migraine., Disp: 60 tablet, Rfl: 0    XIFAXAN 550 mg Tab, Take 1 tablet (550 mg total) by mouth Two (2) times a day., Disp: 180 tablet, Rfl: 4    Allergies  Haldol [haloperidol lactate], Cyclosporine, Tetracycline, and Tramadol    Family History  Family History   Problem Relation Age of Onset    Lymphoma Mother     Diabetes Mother     Hypertension Mother     Skin cancer Mother     Arthritis Mother     Cancer Mother         lymph    Kidney disease Mother     Vision loss Mother  Macular D.    Parkinsonism Father     Hypertension Father     Colon cancer Father 52    Macular degeneration Father     Alcohol abuse Father     Cancer Father 67        colon    Depression Father     Vision loss Father         Macular D.    Parkinsonism Sister     Tremor Sister         essential tremor, hands and voice    Seizures Sister     Migraines Sister     Macular degeneration Sister     Miscarriages / [place] Sister     Vision loss Sister         Macular d    Skin cancer Sister     Tremor Sister         no diagnosis    Migraines Sister     Macular degeneration Sister     Vision loss Sister         Macular d    Cancer Son         Spinal cancer, leaving pt disabled    Diabetes Son     Mental illness Son     Neuropathy Son     Depression Son     Drug abuse Son     Hypertension Son     Kidney disease Son     Vision loss Son         Diabetes    Mental illness Son     Intellectual Disability Son     Learning disabilities Son     Skin cancer Maternal Grandmother     Stroke Maternal Grandmother     Alcohol abuse Paternal Grandmother     Heart disease Paternal Grandfather     Anesthesia problems Neg Hx        Social History  Social History     Tobacco Use    Smoking status: Former     Current packs/day: 0.00     Average packs/day: 1 pack/day for 15.0 years (15.0 ttl pk-yrs)     Types: Cigarettes     Start date: 04/26/1979     Quit date: 04/25/1994     Years since quitting: 27.8    Smokeless tobacco: Never   Vaping Use    Vaping Use: Former   Substance Use Topics    Alcohol use: Not Currently    Drug use: Never       Review of Systems:   Pertinent positives and negatives are documented as per the HPI    Physical Exam     VITAL SIGNS:    ED Triage Vitals   Enc Vitals Group      BP 02/10/22 0556 174/103      Heart Rate 02/10/22 0556 96      SpO2 Pulse --       Resp 02/10/22 0556 16      Temp 02/10/22 0556 36.9 ??C (98.4 ??F)      Temp Source 02/10/22 0556 Oral      SpO2 02/10/22 0556 99 %      Weight 02/10/22 0554 (!) 112.2 kg (247 lb 6.4 oz)     Constitutional: Alert and oriented. Well appearing and in no distress.  Eyes: Conjunctivae are normal.   ENT       Head: Normocephalic and atraumatic.       Nose: No  congestion.       Mouth/Throat: Mucous membranes are moist.       Neck: No stridor.  Cardiovascular: Normal rate, regular rhythm.  Respiratory: Normal respiratory effort.  Gastrointestinal: Slight epigastric TTP. Soft, non distend, without rebound or guarding.  Musculoskeletal: No deformities.  Neurologic: Normal speech and language. No gross focal neurologic deficits are appreciated.  Skin: Skin is warm, dry and intact. No rash noted.      EKG       Radiology     No orders to display       Labs     Labs Reviewed   COMPREHENSIVE METABOLIC PANEL - Abnormal; Notable for the following components:       Result Value    Chloride 111 (*)     Total Bilirubin 3.2 (*)     All other components within normal limits   AMMONIA - Abnormal; Notable for the following components:    Ammonia <10 (*)     All other components within normal limits   CBC W/ AUTO DIFF - Abnormal; Notable for the following components:    HCT 44.2 (*)     Platelet 65 (*)     Absolute Lymphocytes 0.5 (*)     All other components within normal limits   ETHANOL - Normal    Narrative:     Testing for medical purposes only.   CBC W/ DIFFERENTIAL    Narrative:     The following orders were created for panel order CBC w/ Differential.                  Procedure                               Abnormality         Status                                     ---------                               -----------         ------                                     CBC w/ [id number]]         Abnormal            Final result Please view results for these tests on the individual orders.   URINALYSIS WITH MICROSCOPY WITH CULTURE REFLEX       Pertinent labs & imaging results that were available during my care of the patient were reviewed by me and considered in my medical decision making (see chart for details).    Documentation assistance was provided by  Marylene Land, Scribe, on February 10, 2022 at 7:23 AM for Arliss Journey, MD.      A scribe was used when documenting this visit. I agree with the above documentation. Signed by  Marlan Palau, DO on  February 13, 2022 at 7:11 PM         Jones Skene, DO  02/13/22 1911

## 2022-02-10 NOTE — Unmapped (Signed)
Pt arrives with C/O emesis for over 24 hours, pt also reports fever and diarrhea

## 2022-02-11 NOTE — Unmapped (Signed)
[facility]  Emergency Department Progress Note    February 10, 2022 5:25 PM         ED care from Dr. Izola Price at 3:00 PM. Alexis Burns is a 56 y.o. female with PMH of pre-diabetes, HTN, HLD, NASH cirrhosis, arthritis, and asthma presenting for nausea, NBNB emesis, and upper abdominal pain. She has a history of similar symptoms ongoing for several months. On exam, slight epigastric TTP. Abdomen is soft.     Labs with no leukocytosis or acute electrolyte abnormalities. hsTroponin negative. UA without evidence of UTI. Patient was still having pain after GI cocktail and Zofran. Pending CT A/P and reassessment after additional pain medication.       Medical Decision Making and Progress Notes     5:29 PM  CT A/P with no acute findings.     5:34 PM  Reassessed patient, she notes some improvement of her symptoms. Discussed CT scan with patient. She is still experiencing some nausea so will give dose of Phenergan and Bentyl, plan for discharge after. Patient concern this will not be sufficient for her symptoms and after shared medical decision about the benefit vs risk of opioid use for abdominal pain we agree on a short course of oxycodone to be use only for breakthrough pain. I discussed my evaluation of the patient's symptoms, my clinical impression, and my proposed outpatient treatment plan.  We have discussed anticipatory guidance, scheduled follow-up, and careful return precautions. I provided an opportunity and answered any questions the patient had. The patient expresses understanding and is comfortable with the discharge plan.        Discussion of Management with other Physicians, QHP or Appropriate Source: N/A   Independent Interpretation of Studies: CT A/P detailed above   External Records Reviewed: Patient's most recent outpatient clinic note  Escalation of Care, Consideration of Admission/Observation/Transfer: Patient does not require admission at this time given reassuring work-up. Plan for discharge to follow up with her PCP.   Social determinants that significantly affected care: None applicable  Prescription drug(s) considered but not prescribed: N/A  Diagnostic tests considered but not performed: N/A  History obtained from other sources: None      Portions of this record have been created using Scientist, clinical (histocompatibility and immunogenetics). Dictation errors have been sought, but may not have been identified and corrected.      ED Clinical Impression       Final diagnoses:   Vomiting, unspecified vomiting type, unspecified whether nausea present (Primary)       Documentation assistance was provided by Winferd Humphrey, Scribe, on February 10, 2022 at 5:30 PM for Pincus Sanes, MD.    February 10, 2022 6:22 PM. Documentation assistance provided by the scribe. I was present during the time the encounter was recorded. The information recorded by the scribe was done at my direction and has been reviewed and validated by me.

## 2022-02-11 NOTE — Unmapped (Signed)
[facility] Refill Coordination Note    Specialty Medication(s) to be Shipped:   Infectious Disease: Xifaxan    Other medication(s) to be shipped:  imitrex     Alexis Burns, DOB: [birth year]  Phone: [phone] (home)       All above HIPAA information was verified with patient.     Was a Nurse, learning disability used for this call? No    Completed refill call assessment today to schedule patient's medication shipment from the [facility] [phone]).  All relevant notes have been reviewed.     Specialty medication(s) and dose(s) confirmed: Regimen is correct and unchanged.   Changes to medications: Younique reports no changes at this time.  Changes to insurance: No  New side effects reported not previously addressed with a pharmacist or physician: None reported  Questions for the pharmacist: No    Confirmed patient received a Conservation officer, historic buildings and a Surveyor, mining with first shipment. The patient will receive a drug information handout for each medication shipped and additional FDA Medication Guides as required.       DISEASE/MEDICATION-SPECIFIC INFORMATION        N/A    SPECIALTY MEDICATION ADHERENCE     Medication Adherence    Patient reported X missed doses in the last month: 0  Specialty Medication: XIFAXAN 550 mg Tab (rifAXIMin)  Patient is on additional specialty medications: No  Patient is on more than two specialty medications: No                                Were doses missed due to medication being on hold? No    XIFAXAN 550 mg Tab (rifAXIMin)  : 5 days of medicine on hand        REFERRAL TO PHARMACIST     Referral to the pharmacist: Not needed      [facility]     Shipping address confirmed in Epic.     Delivery Scheduled: Yes, Expected medication delivery date: 02/15/22.     Medication will be delivered via Same Day Courier to the prescription address in Epic [name].    Alexis Burns   [facility] Specialty Technician

## 2022-02-14 ENCOUNTER — Ambulatory Visit
Admit: 2022-02-14 | Discharge: 2022-02-15 | Payer: PRIVATE HEALTH INSURANCE | Attending: Family Medicine | Primary: Family Medicine

## 2022-02-14 ENCOUNTER — Ambulatory Visit
Admit: 2022-02-14 | Discharge: 2022-02-15 | Payer: PRIVATE HEALTH INSURANCE | Attending: Student in an Organized Health Care Education/Training Program | Primary: Student in an Organized Health Care Education/Training Program

## 2022-02-14 DIAGNOSIS — R7303 Prediabetes: Principal | ICD-10-CM

## 2022-02-14 DIAGNOSIS — R112 Nausea with vomiting, unspecified: Principal | ICD-10-CM

## 2022-02-14 DIAGNOSIS — K649 Unspecified hemorrhoids: Principal | ICD-10-CM

## 2022-02-14 DIAGNOSIS — Z6838 Body mass index (BMI) 38.0-38.9, adult: Principal | ICD-10-CM

## 2022-02-14 MED ORDER — PROMETHAZINE 25 MG TABLET
ORAL_TABLET | Freq: Four times a day (QID) | ORAL | 0 refills | 8 days | Status: CP | PRN
Start: 2022-02-14 — End: 2022-02-21

## 2022-02-14 MED ORDER — DICYCLOMINE 20 MG TABLET
ORAL_TABLET | Freq: Four times a day (QID) | ORAL | 0 refills | 30 days | Status: CP | PRN
Start: 2022-02-14 — End: 2022-03-16

## 2022-02-14 MED ORDER — HYDROCORTISONE 2.5 % TOPICAL CREAM WITH PERINEAL APPLICATOR
Freq: Two times a day (BID) | RECTAL | 0 refills | 0 days | Status: CP
Start: 2022-02-14 — End: ?

## 2022-02-14 NOTE — Unmapped (Addendum)
[facility]  [address]  [place], [place]  [address]  Ph: [phone]  Fax: [phone]     NEW GENERAL GASTROENTEROLOGY VISIT    REFERRING PROVIDER: Launa Flight,*  PRIMARY CARE PROVIDER: Aura Camps, FNP    DATE OF SERVICE: 02/14/2022    ASSESSMENT AND PLAN:   Ms. Salome is a 56 y.o. female with history of MASH cirrhosis, chronic n/v, anxiety/depression seen in consultation for nausea and vomiting.    Nausea, vomiting, abdominal pain: Symptoms have been present for the past several months despite stopping Ozempic.  Underlying comorbidities include anxiety and depression which has been treated with electroconvulsive therapy in June 2023.  She is on an SSRI but has not felt this has improved her symptoms.  She did undergo an EGD a few months ago which was relatively normal and showed no etiology to explain her nausea, vomiting or abdominal pain.  Suspect her symptoms are functional in nature which we extensively discussed today.  Given that she has not had improvement with Bentyl and Prilosec, we will increase the dose and frequency to see if this is able to control her symptoms.  Have also sent a prescription for Phenergan.  If these measures do not control her symptoms, I have discussed starting a TCA which she is amenable to.  All questions answered, follow-up in 3 months.  - Increase Bentyl to 20 mg 4 times daily as needed  - Continue Prilosec  - Continue SSRI  - If no improvement of symptoms after 3 weeks, will likely start TCA    External hemorrhoid:  Has been present for the past 1-2 years.  Is painful on palpation.  Despite supportive measures, has not improved.  Provided hydrocortisone cream today.  - Hydrocortisone cream ordered.    I spent a majority of my time today with the patient reviewing historical data, old records (including faxed data and data in Care Everywhere), performing a physical examination, and formulating an assessment and plan together with the patient.    Return to clinic in 3-4 months.     Lajoyce Lauber, MD  [employer] [facility]  Division of Gastroenterology & Hepatology  p: [phone]- f: [phone]    I saw and evaluated Hans Eden with GI fellow Dr. Shana Chute.  I participated in key portions of the service and personally reviewed the plan of care with the patient.  I reviewed the fellow's note and agree with the fellow???s findings and plan.     Misty Stanley MD  Professor of Medicine  Division of Gastroenterology & Hepatology  [facility]]    Subjective   SUBJECTIVE:     CHIEF COMPLAINT: vomiting    HISTORY OF PRESENT ILLNESS:      Ceaira Ernster is a 56 y.o. female with history of MASH cirrhosis, chronic n/v, anxiety/depression seen in consultation for nausea and vomiting.    Patient reports that she was last in her usual state of health and felt well around 1.5 years ago.  This was prior to starting Ozempic for her NASH cirrhosis.  Shortly after this, she started to develop nausea which her and her liver team attributed to the medication.  She tried to stick with this medication for a few months however her symptoms persisted she ended up discontinuing this in early 2023.    Unfortunately despite stopping the Ozempic, her symptoms persisted.  She was seen by her  psychiatrist and started electroconvulsive therapy in around June 2023 which helped with her mood but her nausea persisted.  In August 2023, her vomiting started and she describes that about 1 episode per day which has increased to around 2-3 episodes per day in the past few months.    She says she is able to eat her food with no issues and no emesis but will occasionally experience symptoms of nausea that will hit her at random times throughout the day with emesis that follows.  She describes the emesis as bilious.  She recently went to the ED a few days ago for acute worsening of the symptoms which she attributed to a viral gastroenteritis and was prescribed Bentyl and Prilosec which she feels have helped her symptoms.    She does use marijuana Gummies but states this only helps her nausea.  She denies any use of any alcohol or tobacco use.She does endorse some abdominal pain which is constant and usually alleviated by her nausea and vomiting.    MEDICATIONS:   Current Outpatient Medications   Medication Instructions   ??? cloNIDine HCL (CATAPRES) 0.2 mg, Oral, Nightly, Monitor for dizziness with standing.   ??? dicyclomine (BENTYL) 20 mg, Oral, 2 times a day (standard)   ??? famotidine (PEPCID) 20 mg, Oral, 2 times a day (standard)   ??? hydrOXYzine (ATARAX) 50 mg, Oral, Daily PRN   ??? ondansetron (ZOFRAN-ODT) 8 mg, Oral, Every 8 hours PRN   ??? oxyCODONE (ROXICODONE) 5 mg, Oral, Every 8 hours PRN   ??? promethazine (PHENERGAN) 25 mg, Oral, Every 8 hours PRN   ??? sertraline (ZOLOFT) 100 mg, Oral, Daily (standard)   ??? sumatriptan (IMITREX) 50 mg, Oral, Every 2 hours PRN   ??? XIFAXAN 550 mg Tab Take 1 tablet (550 mg total) by mouth Two (2) times a day.          Objective   OBJECTIVE:   VITAL SIGNS: There were no vitals taken for this visit.  Wt Readings from Last 3 Encounters:   02/10/22 (!) 112.2 kg (247 lb 6.4 oz)   12/26/21 (!) 140.6 kg (310 lb)   12/11/21 (!) 108.4 kg (239 lb 1.3 oz)       PHYSICAL EXAM:  Constitutional: Alert, Oriented x 3, No acute distress  HEENT: PERRL, conjunctiva clear, anicteric  CV: Regular rate and rhythm, normal S1, S2.   Lung: Clear to auscultation bilaterally. Unlabored breathing.   Abdomen: soft, normal bowel sounds, non-distended, non-tender. No organomegaly. No ascites.   Extremities: No edema, well perfused  Skin: No rashes, jaundice or skin lesions noted  Mental Status: Thought organized, appropriate affect    Vital Signs:  There were no vitals filed for this visit.      PREVIOUS VITALS AND ANTHROPOMETRIC DATA:  BP Readings from Last 3 Encounters:   02/10/22 124/84   01/30/22 154/104 12/31/21 170/98    Pulse Readings from Last 3 Encounters:   02/10/22 84   01/30/22 87   12/31/21 91    Wt Readings from Last 3 Encounters:   02/10/22 (!) 112.2 kg (247 lb 6.4 oz)   12/26/21 (!) 140.6 kg (310 lb)   12/11/21 (!) 108.4 kg (239 lb 1.3 oz)      BMI:   BMI Readings from Last 1 Encounters:   02/10/22 39.93 kg/m??       Joella Prince, MD  Gastroenterology Fellow, PGY-4  [employer] [place]     Portions of this record may have  been created using Scientist, clinical (histocompatibility and immunogenetics). Dictation errors have been sought, but may not have all been identified and corrected.

## 2022-02-14 NOTE — Unmapped (Addendum)
Alexis Gudalupe Burns is a 56 y.o. female with Class 3 obesity due to weight gain associated with unhealthy lifestyle, including physical inactivity and frequent consumption of ultraprocessed foods and products with high amounts of added sugars, including Mt. Dew. Additionally, Pt gained weight with smoking cessation and post-partum weight retention. Comorbidities include prediabetes, hyperlipidemia, hypertension, cirrhosis, depression. Barriers include BED, depression/anxiety, ankle and back pain.    Weight Summary:  Starting weight/BMI/WC/[place]: 245 lbs, BMI 40.77, WC 44, [place] 14 (04/05/2021)  Target weight/goal: No goal weight. Patient wants general improvement in health and appearance.  Today's weight/BMI: (!) 111.9 kg (246 lb 12.8 oz), BMI 38(!) 39.85 (02/14/2022)  % Body weight loss: N/A  Today's Waist Circumference: 48 inches (02/14/2022)  Today's visit number: 6th  Duration in program: 10 mos    Referred by: Self, Referred    GOALS: 1) Return in 6 mos after focusing on healing from n/v    I have reviewed the patient's medical history, lifestyle history and labs/tests.   My recommendations include the following:    Lifestyle Pattern Summary: Pt will take a 6 mo break from our program while focusing on other priorities. Provided some recommendations for healthy swaps.   - See GOALS above.     Medication: Pt has continued to have nausea and vomiting off Ozempic. Wishes to hold Ozempic indefinitely due to GI issues. Pt will take a 6 mo break from our program while focusing on other priorities.   Tried/Contraindicated:  - Wellbutrin: CI d/t anxiety  - Topiramate: CI d/t Hx of nephroliathisis (confirmed by CT)   - Ozempic: stopped due to functional nausea/vomiting    Obesity Surgery: Declines for now. Wishes to try medical management but will consider in the future. Pt has had ~10 surgeries including C-section x2, cholecystectomy, hysterectomy & other musculoskeletal surgeries. Pt will keep all options open, especially due to multiple comorbidities including cirrhosis. (Updated 04/05/2021)    Medical conditions:  Glaucoma: no  Seizures: no  Medullary thyroid cancer (personal or family hx): no  Multiple Endocrine Neoplasia: no  Palpitations/Tachycardia: no  Chest Pain: no past history of MI.   Headaches/Migraines: YES  Nephrolithiasis: YES  H/o pancreatitis: YES -- gallstone pancreatitis 35 yrs ago  GERD: no     Prior Surgeries:  Cholecystectomy: YES  Hysterectomy: YES    Birth Control Methods: N/A--post meno/hysterectomy
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
## 2022-02-14 NOTE — Unmapped (Addendum)
[facility] at [facility]  Phone: [phone]         Thank you for coming in today, it was a pleasure to see you and it is an honor to help take care of you.     After Visit Instructions:  -- try bentyl 20 mg 4 times daily.   -- watch for hepatic encephalopathy  -- if your symptoms persist, please let me know and we can try a different medication  --try anusol-hc for your hemorrhoid. Apply this twice a day    -- I will see you back in clinic in 3-4 months. If you need to contact me, you may either send me a MyChart message or call the office and ask to speak with Zella Ball (the nurse that works with me). MyChart messages are for non-urgent questions and may take up to 5 days to receive a response.     -------------------------------------------------------------------------------------------------------  Thank you for visiting our Gastroenterology Clinic!    Important phone numbers:     GI Clinic and Nutrition Appointments:  [phone] option 1 for appointments, then option 1 for clinic appointments.  Please call the GI Clinic appointment line if you need to schedule, reschedule, or cancel an appointment in clinic. They can also answer any questions you may have about where your appointment is located and when you need to arrive.       GI Procedure Appointments:  [phone] option 1 for appointments, then option 2 for procedure appointments.  Please call the GI Procedures line if you need to schedule, reschedule, or cancel any type of GI procedure (endoscopy, colonoscopy, motility testing, etc).  You can also call this number for prep instructions, etc.       Radiology: [phone]  If you are being scheduled for any type of radiology study, you will need to call to receive your appointment time.  Please call this number for information.        For emergencies after normal business hours or on weekends/holidays  Proceed to the nearest emergency room OR contact the [facility] Operator at [phone] who can page the Gastroenterology Fellow on call.      TEST RESULTS  If you have a MyChart account, your new results will be sent to you through your MyChart account at [email or web address]. For results that require follow-up, a member of your healthcare team will also contact you directly.    PRESCRIPTION REFILL REQUESTS  To request prescription refills, please contact your pharmacy or send your healthcare team a message through your MyChart account at [email or web address]  RECORD REQUESTS  For questions related to medical records, please call Medical Records Release of Information at [phone]  Georgena Spurling  For billing and other financial questions/needs - please contact [phone]. If you need to leave a message, please be sure to leave your full name, date of birth or MR#, best call back # and reason for call.    Sincerely,  Raynelle Highland. Shana Chute, MD  Division of Gastroenterology and Hepatology

## 2022-02-15 MED FILL — XIFAXAN 550 MG TABLET: ORAL | 30 days supply | Qty: 60 | Fill #7

## 2022-02-20 NOTE — Unmapped (Signed)
Hx of preDM. Reviewed relevant medications and labs. Pt is following our [facility]. See Obesity tab for medication changes.

## 2022-02-20 NOTE — Unmapped (Incomplete)
[facility] ECT               Procedure Note                                           Requesting Attending Physician: No att. providers found  Admit Date: (Not on file)     Service Type: Inpatient  Requesting Attending Physician: No att. providers found  Service requesting consult: Psychiatry  Consulting service: Psychiatry  Admit Date: (Not on file)  Service Date: February 19, 2022    Time out was taken with staff to confirm correct patient and correct procedure to be performed.    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 Much improved    Beck Depression Inventory Total Score:     Baseline: 51  Most recent:  30    Mini-Mental Status ExamTotal Score:     Baseline: 25  Most recent: 30    MEDICAL INFORMATION:    Allergies: Haldol [haloperidol lactate], Cyclosporine, Tetracycline, and Tramadol    Medical History: See ECT Consult    Surgical History: See ECT Consult    VITAL SIGNS:      Pre-procedure:    There were no vitals filed for this visit.  ***    Post-stimulus:     No data found.  ***      THYMATRON:  ECT # Thymatron Settings (%) Modification Seizure   Length Cuff Duration  (sec) EEG Duration  (sec)   1 100 Mild Adequate *** ***       Medications given during procedure:   Current Outpatient Medications   Medication Sig Dispense Refill    cloNIDine HCL (CATAPRES) 0.1 MG tablet Take 2 tablets (0.2 mg total) by mouth nightly. Monitor for dizziness with standing. 60 tablet 2    dicyclomine (BENTYL) 20 mg tablet Take 1 tablet (20 mg total) by mouth four (4) times a day as needed. 120 tablet 0    famotidine (PEPCID) 20 MG tablet Take 1 tablet (20 mg total) by mouth two (2) times a day for 10 days. 20 tablet 0 hydrocortisone (ANUSOL-HC) 2.5 % rectal cream Insert into the rectum two (2) times a day. 30 g 0    hydrOXYzine (ATARAX) 25 MG tablet Take 2 tablets (50 mg total) by mouth daily as needed for anxiety. 60 tablet 2    promethazine (PHENERGAN) 25 MG tablet Take 1 tablet (25 mg total) by mouth every six (6) hours as needed for nausea for up to 7 days. 30 tablet 0    sertraline (ZOLOFT) 100 MG tablet Take 1 tablet (100 mg total) by mouth daily. 30 tablet 2    sumatriptan (IMITREX) 50 MG tablet Take 1 tablet (50 mg total) by mouth every two (2) hours as needed for migraine. 60 tablet 0    XIFAXAN 550 mg Tab Take 1 tablet (550 mg total) by mouth Two (2) times a day. 180 tablet 4     No current facility-administered medications for this visit.        labetelol 5 mg IV  Neck Collar Used:  Yes     PROGRESS NOTE:     Alexis Burns is a 56 y.o. female who presents for outpatient continuation ECT 3 weeks after her last treatment, her first at this interval. She had an upper GI earlier this week showing gastric erosions    . States that her mental health has taken a backseat to her physical health problems - her liver is not filtering toxins anymore, has to take laxatives and antibiotics. Experiences a lot of vomiting. This AM has a HA from being dehydrated, feels nauseous. Feels like she does not have time to think about how she is feeling mentally. Sees hepatology here at [facility]. Discuss plan to pre-treat with phenergan. She also asks for something for HA. She hopes to spread out her next treatment to 3 weeks.     Pt with more muscle ache and ha after last tx. willl avoid tylenol due to liver . Discussed toradol with pt and anesthesia and felt one dose once a month would be alright    Mental Status Exam:  Appearance:    Well nourished and Well developed   Behavior:  Calm and Cooperative   Motor:   No abnormal movements   Speech/Language:    Normal rate, volume, tone, fluency and Language intact, well formed   Mood: okay, melancholy   Affect:   Calm, euthymic, cooperative, bright   Thought process:   Logical, linear, clear, coherent, goal directed   Thought content:    Denies suicidal ideation   Perceptual disturbances:     Denies auditory and visual hallucinations, behavior not concerning for response to internal stimuli   Orientation:   Oriented to person, place, time, and general circumstances   Attention:   Able to fully attend without fluctuations in consciousness   Concentration:   Able to fully concentrate and attend     Physical Exam:   Gen: NAD  Pulm: No increased work of breathing  Neuro: No tremors, tics, or abnormal movements    Assessment and Plan:   Mood Disorder: Maj Depress D/O, 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     Seizure was of adequate length, fair amplitude, fair morphology, and poor post-ictal suppression.    Interventions Today:    Medication : caffeine  120mg , labetelol 5 mg IV  Energy adjustments: none    Post-Treatment Complications: Hypertension treated with labetalol 5mg      Recommendations for Future Treatments:    Medication adjustments:  none  Energy adjustments:    100%    Frequency: return in 3 weeks then increase as tolerated    Next Treatment Date: 03/20/21    Meds to take prior to ECT: None    Special Instructions: none    Assisted by: NA    Iran Planas, MD

## 2022-02-22 ENCOUNTER — Encounter: Admit: 2022-02-22 | Discharge: 2022-02-23 | Payer: PRIVATE HEALTH INSURANCE

## 2022-02-22 ENCOUNTER — Ambulatory Visit: Admit: 2022-02-22 | Discharge: 2022-02-23 | Payer: PRIVATE HEALTH INSURANCE

## 2022-02-22 MED ADMIN — succinylcholine (ANECTINE) injection: INTRAVENOUS | @ 19:00:00 | Stop: 2022-02-22

## 2022-02-22 MED ADMIN — caffeine citrate (CAFCIT) injection: INTRAVENOUS | @ 19:00:00 | Stop: 2022-02-22

## 2022-02-22 MED ADMIN — ondansetron (ZOFRAN) injection: INTRAVENOUS | @ 19:00:00 | Stop: 2022-02-22

## 2022-02-22 MED ADMIN — promethazine (PHENERGAN) injection: INTRAVENOUS | @ 19:00:00 | Stop: 2022-02-22

## 2022-02-22 MED ADMIN — sodium chloride (NS) 0.9 % infusion: INTRAVENOUS | @ 19:00:00 | Stop: 2022-02-22

## 2022-02-22 MED ADMIN — ketorolac (TORADOL) injection: INTRAVENOUS | @ 19:00:00 | Stop: 2022-02-22

## 2022-02-22 MED ADMIN — Propofol (DIPRIVAN) injection: INTRAVENOUS | @ 19:00:00 | Stop: 2022-02-22

## 2022-02-22 MED ADMIN — ketamine (KETALAR) injection: INTRAVENOUS | @ 19:00:00 | Stop: 2022-02-22

## 2022-02-22 MED ADMIN — labetalol (NORMODYNE) injection: INTRAVENOUS | @ 19:00:00 | Stop: 2022-02-22

## 2022-02-22 NOTE — Unmapped (Signed)
[facility] ECT               Procedure Note                                           Requesting Attending Physician: Not In System Provider  Admit Date: 02/22/2022     Service Type: Inpatient  Requesting Attending Physician: Not In System Provider  Service requesting consult: Psychiatry  Consulting service: Psychiatry  Admit Date: 02/22/2022  Service Date: February 22, 2022    Time out was taken with staff to confirm correct patient and correct procedure to be performed.    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 Much improved    Beck Depression Inventory Total Score:     Baseline: 51  Most recent:  25    Mini-Mental Status ExamTotal Score:     Baseline: 25  Most recent: 29    MEDICAL INFORMATION:    Allergies: Haldol [haloperidol lactate], Cyclosporine, Tetracycline, and Tramadol    Medical History: See ECT Consult    Surgical History: See ECT Consult    VITAL SIGNS:      Pre-procedure:      Post-stimulus:   141/74  100    THYMATRON:  ECT # Thymatron Settings (%) Modification Seizure   Length Cuff Duration  (sec) EEG Duration  (sec)   1 100 Mild Adequate 20 41       Medications given during procedure:   Current Outpatient Medications   Medication Sig Dispense Refill    cloNIDine HCL (CATAPRES) 0.1 MG tablet Take 2 tablets (0.2 mg total) by mouth nightly. Monitor for dizziness with standing. 60 tablet 2    dicyclomine (BENTYL) 20 mg tablet Take 1 tablet (20 mg total) by mouth four (4) times a day as needed. 120 tablet 0    famotidine (PEPCID) 20 MG tablet Take 1 tablet (20 mg total) by mouth two (2) times a day for 10 days. 20 tablet 0    hydrocortisone (ANUSOL-HC) 2.5 % rectal cream Insert into the rectum two (2) times a day. 30 g 0    hydrOXYzine (ATARAX) 25 MG tablet Take 2 tablets (50 mg total) by mouth daily as needed for anxiety. 60 tablet 2    sertraline (ZOLOFT) 100 MG tablet Take 1 tablet (100 mg total) by mouth daily. 30 tablet 2    sumatriptan (IMITREX) 50 MG tablet Take 1 tablet (50 mg total) by mouth every two (2) hours as needed for migraine. 60 tablet 0    XIFAXAN 550 mg Tab Take 1 tablet (550 mg total) by mouth Two (2) times a day. 180 tablet 4     No current facility-administered medications for this encounter.     Facility-Administered Medications Ordered in Other Encounters   Medication Dose Route Frequency Provider Last Rate Last Admin    caffeine citrate (CAFCIT) injection   Intravenous PRN (once a day) Loeper, Christene Lye, CRNA   120 mg at 02/22/22 1418    ketamine (KETALAR) injection   Intravenous PRN (once a day) Kolasa, Christene Lye, CRNA  60 mg at 02/22/22 1418    ketorolac (TORADOL) injection   Intravenous PRN (once a day) Hargun, Fiechtner, CRNA   15 mg at 02/22/22 1422    ondansetron (ZOFRAN) injection   Intravenous PRN (once a day) Waymond Cera, CRNA   4 mg at 02/22/22 1422    promethazine (PHENERGAN) injection   Intravenous PRN (once a day) Jodee, Bishara, CRNA   12.5 mg at 02/22/22 1422    Propofol (DIPRIVAN) injection   Intravenous PRN (once a day) Jaszmine, Linhart, CRNA   80 mg at 02/22/22 1418    sodium chloride (NS) 0.9 % infusion   Intravenous Continuous PRN Waymond Cera, CRNA   New Bag at 02/22/22 1413    succinylcholine (ANECTINE) injection   Intravenous PRN (once a day) Shandrica, [place], CRNA   120 mg at 02/22/22 1420        Post-treatment complications: HTN responsive to labetelol 5 mg IV    Neck Collar Used:  No - Generally do use neck collar for her, should use neck collar going forward    PROGRESS NOTE:     Alexis Burns is a 56 y.o. female who presents for outpatient continuation ECT 3 weeks after her last treatment, her second at the 3 week interval. She continues her first at this interval. She had an upper GI earlier this week showing gastric erosions and she was diagnosed with hepatic cirrhosis.  She had severe headache and neck pain after her last treatment but could not take NSAIDS or tylenol.  Physical health problems continue to be a major stressor including vomiting, dehydration and nausea. Continues to be treated by GI and hepatology.     Mental Status Exam:  Appearance:    Well nourished and Well developed   Behavior:  Calm and Cooperative   Motor:   No abnormal movements   Speech/Language:    Normal rate, volume, tone, fluency and Language intact, well formed   Mood:   Not depressed at all.   Affect:   Calm, euthymic, cooperative, bright   Thought process:   Logical, linear, clear, coherent, goal directed   Thought content:    Denies suicidal ideation   Perceptual disturbances:     Denies auditory and visual hallucinations, behavior not concerning for response to internal stimuli   Orientation:   Oriented to person, place, time, and general circumstances   Attention:   Able to fully attend without fluctuations in consciousness   Concentration:   Able to fully concentrate and attend     Physical Exam:   Gen: NAD  Pulm: No increased work of breathing  Neuro: No tremors, tics, or abnormal movements    Assessment and Plan:   Mood Disorder: Maj Depress D/O, 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     Seizure was of adequate length, fair amplitude, fair morphology, and poor post-ictal suppression.    Interventions Today:    Medication : caffeine  120mg , labetelol 5 mg IV  -Will continue to pre-treat with phenergan. Anesthesia feels one dose once a month would be alright.  Energy adjustments: none    Post-Treatment Complications: Hypertension treated with labetalol 5mg      Recommendations for Future Treatments:    Medication adjustments:  none  Energy adjustments:    none    Frequency: return in 3 weeks then increase as tolerated    Next Treatment Date: 03/15/22    Meds to take prior to ECT: None  Special Instructions: none    Assisted by: Kathaleen Bury, MD    Iran Planas, MD

## 2022-02-26 NOTE — Unmapped (Signed)
Left Message for patient to call back and schedule appointment for 1/12

## 2022-03-05 ENCOUNTER — Telehealth: Admit: 2022-03-05 | Discharge: 2022-03-06 | Payer: PRIVATE HEALTH INSURANCE

## 2022-03-05 DIAGNOSIS — F32A Depression, unspecified depression type: Principal | ICD-10-CM

## 2022-03-05 DIAGNOSIS — F411 Generalized anxiety disorder: Principal | ICD-10-CM

## 2022-03-05 DIAGNOSIS — F959 Tic disorder, unspecified: Principal | ICD-10-CM

## 2022-03-05 MED ORDER — SERTRALINE 100 MG TABLET
ORAL_TABLET | Freq: Every day | ORAL | 2 refills | 30 days | Status: CP
Start: 2022-03-05 — End: 2022-06-03

## 2022-03-05 MED ORDER — HYDROXYZINE HCL 25 MG TABLET
ORAL_TABLET | Freq: Every day | ORAL | 2 refills | 30 days | Status: CP | PRN
Start: 2022-03-05 — End: ?

## 2022-03-05 MED ORDER — CLONIDINE HCL 0.1 MG TABLET
ORAL_TABLET | Freq: Every evening | ORAL | 2 refills | 30 days | Status: CP
Start: 2022-03-05 — End: ?

## 2022-03-05 NOTE — Unmapped (Signed)
Follow-up instructions:  -- Please continue taking your medications as prescribed for your mental health.   -- Do not make changes to your medications, including taking more or less than prescribed, unless under the supervision of your physician. Be aware that some medications may make you feel worse if abruptly stopped  -- Please refrain from using illicit substances, as these can affect your mood and could cause anxiety or other concerning symptoms.   -- Seek further medical care for any increase in symptoms or new symptoms such as thoughts of wanting to hurt yourself or hurt others.     Contact info:  Life-threatening emergencies: Call 911, the 988 suicide and crisis lifeline, or go to the nearest ER for medical or psychiatric attention.     Issues that need urgent attention but are not life threatening: Call the clinic outpatient front desk for assistance. [phone] for our [facility] clinic located at [address] [facility]. [phone] for our [facility] STEP clinic.    Non-urgent routine concerns and questions: Send a message through [facility] or call our clinic front desk.    Refill requests: Check with your pharmacy to initiate refill requests.    Regarding appointments:  - If you need to cancel your appointment, we ask that you call your clinic at least 24 hours before your scheduled appointment. [phone] for our [facility] clinic located at [address] [facility]. [phone] for our [facility] STEP clinic.  - If for any reason you arrive 15 minutes later than your scheduled appointment time, you may not be seen and your visit may be rescheduled.  - Please remember that we will not automatically reschedule missed appointments.  - If you miss two (2) appointments without letting us know in advance, you will likely be referred to a provider in your community.  - We will do our best to be on time. Sometimes an emergency will arise that might cause your clinician to be late. We will try to inform you of this when you check in for your appointment. If you wait more than 15 minutes past your appointment time without such notice, please speak with the front desk staff.    In the event of bad weather, the clinic staff will attempt to contact you, should your appointment need to be rescheduled. Additionally, you can call the Patient Weather Line [phone] for system-wide clinic status    For more information and reminders regarding clinic policies (these were provided when you were admitted to the clinic), please ask the front desk.

## 2022-03-05 NOTE — Unmapped (Unsigned)
[facility]  Psychiatry   Established Patient E&M Service - Outpatient       Assessment:  Alexis Burns is a 57 y.o. female with a history of MDD and GAD who presents for follow-up evaluation. Alexis Burns continues to report ongoing stable mood. She described today additional insight into her childhood experience, and how this relates to difficulties with distress tolerance as an adult. She reports that this realization has been helpful, and feels she has improved in her ability to navigate stressors and difficult familial relationships. Continues to receive ECT q3-4weeks and finds it beneficial, though is unsure if she needs additional treatments. She endorses chronic, involuntary tongue movements/grunting (though not evident during the interview today) and ongoing nausea/vomiting. Discussed potentially initiating guanfacine for tics or Remeron due to its antiemetic effects, though patient declined and is content with her current psychiatric medication regimen. No changes today. Plan to follow up in two months, or sooner if needed. Patient expressed understanding and agrees with plan.     Identifying Information: Alexis Burns is an ADTC patient since 2019 with a history of major depressive disorder and generalized anxiety disorder, with past hospitalization in 2017 at [facility]. In addition, her history of emotional dysregulation, poor distress tolerance, suspicion of others, and splitting appears consistent with borderline personality traits. Past medication trials include Prozac (stopped working), risperidone (brief trial), lithium (intolerable side effects), buspirone (unhelpful at 20mg  TID), duloxetine (90mg  well-tolerated but not helpful on its own), aripiprazole (as adjunct for duloxetine, eventually stopped due to tremors), Wellbutrin (anxiety, dry mouth), Seroquel (ineffective), Cytomel (dry mouth, joint pain). Over several years until late 2020, she was fairly stable on duloxetine 90mg  and Abilify 5mg . She used to work in a Engineer, petroleum pre-pandemic in a stressful role, took time off for a leg injury, and then when she went back to work she was fired. This was destabilizing, and it was closely followed by tibial tendon surgery in and then suspected COVID infection. By Feb 2021, mood remained unstable, but Abilify was causing significant shakiness even at tapered doses, and she discontinued it. She had an exacerbation of depression in May 2021 with her son's mental health as a major stressor, and after intolerance of increasing Cymbalta to 120mg , ineffectiveness of Seroquel, intolerance of Wellbutrin, and time-limited benefit of risperidone, in September 2021 she was admitted to the [facility] Unit for suicidal ideation with a plan to shoot herself. They started Cytomel but she did not tolerate it. She noticed some abnormal tongue movements while on Abilify and these worsened after Abilify discontinuation. We considered the possibility of TD, but neurology evaluation labeled the tongue movements as tic-like, and they also diagnosed her with essential tremor. She started to show signs of liver dysfunction on her bloodwork, and in December 2021 we made the switch from duloxetine to Prozac. This was tolerated well and effective for mood, and mood has varied with family stressors. Both insomnia and hypersomnia have been problems for Alexis Burns, and prns have been very helpful, by spring 2022 sleep had improved with more physical activity. Alexis Burns was eventually restarted on Klonopin in Spring 2023 given severe anxiety/panic that caused her to be unable to leave her house for several weeks. Additionally, she experienced recurrence of depressive symptoms and passive suicidal ideation. Given how well she had done on an SNRI in the past, decision was made to cross taper Prozac to Pristiq. Given continued depressed mood, Alexis Burns was subsequently referred to interventional psychiatry ECT clinic in April 2023.  Due to severity of symptoms, Alexis Burns was directly admitted to [facility] to start inpatient ECT and she was transitioned from Pristiq to sertraline. Klonopin was discontinued and Hydroxyzine PRN was started to target anxiety. She continued ECT as an outpatient.     Risk Assessment:  A suicide and violence risk assessment was performed at our last visit, and safety assessment remains uncharged: she is not at acute risk of harm to herself but is at chronic risk, and we will continue assessing safety at future evaluations.  Stressors: death of father in 09-17-12 and mother in Alexis Burns, chronic depressive symptoms, financial stressors, unemployment, global pandemic and likely COVID19 infection, stressful relationships by son (improving).      Plan:  Problem: MDD - Borderline Personality Traits  Status: improved or improving  Interventions:  - Continue sertraline 100 mg daily   - Patient receiving ECT with Alexis Burns, q3-4weeks  - Continue weekly therapy with Alexis Burns    Problem: GAD  Status of problem: improved or improving   - Continue hydroxyzine 50 mg daily as needed for anxiety  - Continue clonidine 0.2 mg nightly    Problem: Nausea and vomiting, r/o somatic symptom disorder  Status of problem: chronic  Interventions:  - Pt continues to believe symptoms are related to her liver despite reassurance by [facility]   - Upper endoscopy on 10/25 remarkable for mild, chronic gastritis w/ erosion in duodenum and stomach   - Prescribed Zofran by PCP   - Consider Remeron in future visits, given antiemetic effect, though pt declined today    Problem: Abnormal tongue movements  Status of problem: chronic  Interventions:  - Neurology labeled her tongue movements as tic-related, and suggested therapies to help. We had considered if this was a symptoms of tardive dyskinesia as it worsened after Abilify discontinuation which can happen. It originally diminished with stopping artifical sweeteners, but then came back. Neurology's assessment is reassuring  - Clonidine, as above    Problem: Headache following ECT treatment  Status of problem:  chronic and stable  Interventions:  - Prescribed Imitrex by PCP for migraine, headache     Psychotherapy:  No billable psychotherapy service provided.    Patient has been given this writer's contact information as well as the [facility] Psychiat[facility] urgent line number. The patient has been instructed to call 911 for emergencies.    Patient and plan of care were discussed with the Attending MD, Dr. Derrill Kay, who agrees with the above statement and plan.    Jiles Crocker, MD  [facility] PGY2    Subjective:     Psychiatric Chief Concern:  Follow-up psychiatric evaluation for mood    Interval History:  Ongoing issues with involuntary movements of tongue. Will often grunt, not aware of this, until husband mentioned it. Thinks this is a tic. Has been going on for a couple of years.   Have developed a don't care attitude about things in the past. Doesn't care that oldest sister doesn't talk to her anymore. Says she does not miss parents as much as before (passed away 8-10 years ago).   Comes to realization that a lot of problems are a direct result of way I was raised. If she ever had a problem it was her own fault. Discussed that not having the emotional support as a child can affect how she deals with distress today.   Says this realization has changed me. Feels like she is more reasonable, can talk about a problem and won't  go off the deep end. Able to establish better boundaries with children, family members. Set boundaries that are more helpful for the way she wants to live her life. Still communicates with middle sister, mostly superficial. I'm okay, family is okay. 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 Sister can be invalidating for her mental health care. Not sure how she feels about ECT anymore. Feels like it helped, but not sure if she needs to continue with it. Has helped with her for coping with the past, memories not as profound. Rambling thoughts has resolved, can go to sleep better. Still receiving ECT, q3-4 weeks. Eating ok, went to nutritionist recently, slight weight gain, which pt is okay with. Still getting sick on stomach a couple times/day. Have made lifestyle changes including acid free coffee. Believes this is due to too much acid in the stomach.   Discussed guanfacine, which may provide longer coverage during the day to help with tics, pt declined.     Objective:    Mental Status Exam:  Appearance:    Appears stated age, Well nourished and Clean/Neat, sitting in chair in front of screen   Motor:   No abnormal movements or tics noted.    Speech/Language:    Normal rate, volume, tone, fluency and Language intact, well formed   Mood:   okay   Affect:   Full, euthymic, mood congruent   Thought process and Associations:   Logical, linear, clear, coherent, goal directed   Abnormal/psychotic thought content:     Did not endorse SI/HI   Perceptual disturbances:     Behavior not concerning for response to internal stimuli     Other:   -     The patient reports they are physically located in [place] and is currently: at home. I conducted a audio/video visit. I spent  52m 58s on the video call with the patient. I spent an additional 30 minutes on pre- and post-visit activities on the date of service . relationships including cutting ties.     Objective:    Mental Status Exam:  Appearance:    Appears stated age, Well nourished and Clean/Neat, sitting in chair in front of screen   Motor:   No abnormal movements or tics noted.    Speech/Language:    Normal rate, volume, tone, fluency and Language intact, well formed   Mood:   okay   Affect:   Full, euthymic, mood congruent   Thought process and Associations:   Logical, linear, clear, coherent, goal directed   Abnormal/psychotic thought content:     Did not endorse SI/HI   Perceptual disturbances:     Behavior not concerning for response to internal stimuli     Other:   -    {    Coding tips - Do not edit this text, it will delete upon signing of note!    Telephone visits [phone] for Physicians and APPs and [phone] for Non- Physician Clinicians)- Only use minutes on the phone to determine level of service.    Video visits [phone]) - Use either level of medical decision making just as an in-person visit OR time which includes both minutes on video and pre/post minutes to determine the level of service.      :[id number]}    The patient reports they are physically located in [place] and is currently: at home. I conducted a audio/video visit. I spent  0s on the video call with the patient. I spent an additional 30 minutes on pre- and post-visit activities on the date of service .

## 2022-03-08 NOTE — Unmapped (Signed)
I was available. I discussed the case with the resident at length. I reviewed the resident???s note and edited where appropriate. I agree with the resident???s findings and plan.     Russ Halo, MD

## 2022-03-18 NOTE — Unmapped (Signed)
Alexis Burns requested a refill of their Xifaxan 550 mg via IVR. [facility] has scheduled delivery per the patients request via Next Day Courier to be delivered to their prescription address on 03/20/22.

## 2022-03-19 MED FILL — XIFAXAN 550 MG TABLET: ORAL | 30 days supply | Qty: 60 | Fill #8

## 2022-03-21 ENCOUNTER — Telehealth: Admit: 2022-03-21 | Discharge: 2022-03-22 | Payer: PRIVATE HEALTH INSURANCE | Attending: Clinical | Primary: Clinical

## 2022-03-21 DIAGNOSIS — F332 Major depressive disorder, recurrent severe without psychotic features: Principal | ICD-10-CM

## 2022-03-21 DIAGNOSIS — F419 Anxiety disorder, unspecified: Principal | ICD-10-CM

## 2022-03-21 NOTE — Unmapped (Signed)
I spent 22 minutes on video with the patient. I spent an additional 0 minutes on pre- and post-visit activities.  The patient was physically located in [place] or a state in which I am permitted to provide care. The patient understood that s/he may incur co-pays and cost sharing and agreed to the telemedicine visit. The visit was completed via phone and/or video, which was appropriate and reasonable under the circumstances given the patient's presentation at the time.     The patient has been advised of the potential risks and limitations of this mode of treatment (including, but not limited to, the absence of in-person examination) and has agreed to be treated using telemedicine. The patient's/patient's family's questions regarding telemedicine have been answered.      If the phone/video visit was completed in an ambulatory setting, the patient has also been advised to contact their provider???s office for worsening conditions and seek emergency medical treatment and/or call 911 if the patient deems necessary.        [facility]       STEP Patient Psychotherapy    Name: Alexis Burns  Date: 03/21/2022  MRN: [id number]  DOB: [birth year]  PCP: Aura Camps, FNP    Service Duration:  22 minutes        Service: Outpatient Therapy- Individual   [x]  Video     Date of Last Encounter:  Visit date not found    Mental Status/Behavioral Observations  Affect:  Mood congruent   Mood:   irritable and sad   Thought Process:  Goal directed and Linear   Behavior:   Cooperative and Direct eye contact   Self Harm: none and future oriented     Purpose of contact:    [x]   Continue to address treatment goals  []   Treatment Planning/Treatment progress review []  Discharge Planning     Interventions Provided:     [x]   CBT  []   Interpersonal Process Therapy []  Acceptance & Commitment Therapy (ACT)  []  DBT  []  Motivational Interviewing  []  Behavioral Activation []  Psycho-Education  []  Exposure Therapy  []  Trauma-Informed CBT [x]  Person Centered  [x]  Supportive Therapy    Patient Response/Progress:  Identifying Information: Alexis Burns is an ADTC patient since 2019 with a history of depression and anxiety, with past hospitalization in 2017 at [facility]. Over several years until late 2020, she was fairly stable.  She used to work in a Engineer, petroleum pre-pandemic in a stressful role, took time off for a leg injury, and then when she went back to work she was fired. She had an exacerbation of depression in May 2021 with her son's mental health as a major stressor, during 2021 she was admitted to the [facility] for suicidal ideation with a plan to shoot herself.   11/15/21 POC Target Outcomes: reduction in symptoms, including I can't turn my brain off, depression, anxiety, and aggravation/anger, increased independent functioning, specifically not being spoiled and dependent on her husband, reduction in aggresive ideation, employment, improved community integration, including avoiding isolation, and stop focusing on the negative.and stop taking things personally focus on good health.  Treatment/Process:   Patient joins today by video for individual therapy. This session was conducted as a clinical therapy visit to assess acute safety, monitor ongoing clinical treatment plan, and provide continuity to outpatient treatment in the setting of Covid19 Pandemic.  Patient appropriately engaged in the discussion and was able to follow the course of  the conversation; presenting irritable and sad, without indication of psychosis, cooperatively, and future oriented.   Clinician facilitated discussion of pt status, concerns, and POC with emphasis on (I can't turn my brain off, depression, anxiety, and aggravation/anger) with session content including:  Pt reports wanting a short session today given where my brain is at.  Pt reports an ECT treatment tomorrow that she is dreading but knows she needs it because my attitude has been $#!^ for the past 2 weeks noting the sister situation (me thinking about it all the time and I get mad and take out on anyone around) and her son [name] is now married and will return to [place] in the new future. Pt reports limited involvement with him.  Pt reports that the old Jaylise has come back and I don't give a $#!^.  Pt finds that the ECT, while with its negatives, the positive is I feel like my head, my brain cannot accept truth and the ECT helps in part by helping her not to fixate on it.  Pt will going on a short vacation to the beach with her husband noting the positives of the situation.  Clinician processed session content with pt.    02/07/22  Pt reports that she is highly discouraged about doctor situation about her liver noting she didn't know what else to do with me and was going to refer her out but hasn't. Pt reports frustration I've jumped through all the hoops.  Pt reports on friends noting on a situation regarding neighbors and that I've given it too much head space.    Pt reports taking a gummi Delta 8 daily for nausea which it will stop it all day gets me pretty high, gets me relaxed, gets me hungry.I'll eat about anything.  Pt reports having received a check for her approved disability noting it will continue.  Pt reports on her son [name].who is at this time flying to [place] to get married. Pt reflects on I wish him well but he's really not in my life anymore.  Pt reflects on hx of difficulties from my mouth with recent recognition I look and act like a different woman. Pt discusses I grew up with worrying about everything if daddy was in the room you had better be aware of everything.  Pt reflects on her relationships with her sisters, sharing I don't remember why exactly she had it out with [name] sharing thoughts, noting she shows me no mercy whatsoever. 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  Clinician processed session content with pt.    RISK ASSESSMENT: A suicide and violence risk assessment was performed as part of this evaluation.  While future psychiatric events cannot be accurately predicted, the patient does not currently require acute inpatient psychiatric care and does not currently meet [facility] involuntary commitment criteria.      Diagnoses:   Severe recurrent major depression 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) [F33.2]   Anxiety [F41.9]    [name], LCSW  [facility]   03/21/22    Debroah Loop, LCSW  10:05 AM

## 2022-03-22 ENCOUNTER — Ambulatory Visit: Admit: 2022-03-22 | Discharge: 2022-03-23 | Payer: PRIVATE HEALTH INSURANCE

## 2022-03-22 ENCOUNTER — Encounter: Admit: 2022-03-22 | Discharge: 2022-03-23 | Payer: PRIVATE HEALTH INSURANCE

## 2022-03-22 MED ADMIN — labetalol (NORMODYNE) injection: INTRAVENOUS | @ 15:00:00 | Stop: 2022-03-22

## 2022-03-22 MED ADMIN — succinylcholine (ANECTINE) injection: INTRAVENOUS | @ 15:00:00 | Stop: 2022-03-22

## 2022-03-22 MED ADMIN — ondansetron (ZOFRAN) injection: INTRAVENOUS | @ 15:00:00 | Stop: 2022-03-22

## 2022-03-22 MED ADMIN — sodium chloride (NS) 0.9 % infusion: INTRAVENOUS | @ 15:00:00 | Stop: 2022-03-22

## 2022-03-22 MED ADMIN — ketamine (KETALAR) injection: INTRAVENOUS | @ 15:00:00 | Stop: 2022-03-22

## 2022-03-22 MED ADMIN — caffeine citrate (CAFCIT) injection: INTRAVENOUS | @ 15:00:00 | Stop: 2022-03-22

## 2022-03-22 MED ADMIN — promethazine (PHENERGAN) injection: INTRAVENOUS | @ 15:00:00 | Stop: 2022-03-22

## 2022-03-22 MED ADMIN — Propofol (DIPRIVAN) injection: INTRAVENOUS | @ 15:00:00 | Stop: 2022-03-22

## 2022-03-22 MED ADMIN — ketorolac (TORADOL) injection: INTRAVENOUS | @ 15:00:00 | Stop: 2022-03-22

## 2022-03-22 NOTE — Unmapped (Signed)
[facility] ECT               Procedure Note                                           Requesting Attending Physician: Not In System Provider  Admit Date: 03/22/2022     Service Type: Inpatient  Requesting Attending Physician: Not In System Provider  Service requesting consult: Psychiatry  Consulting service: Psychiatry  Admit Date: 03/22/2022  Service Date: March 22, 2022    Time out was taken with staff to confirm correct patient and correct procedure to be performed.    INDICATION FOR ECT: Mood Disorder: Maj Depress D/O, REC  Severe WITHOUT psychotic behavior    TREATMENT HISTORY:    Total Number of Treatments: 26  Current Treatment #: 1  Treatment Type: Maintenance  Electrode Placement: Left Frontal Right Temporal    RATING SCALES:    CGI-Change score for ECT series: CGI-C: 2. Much improved    Beck Depression Inventory Total Score:     Baseline: 51  Most recent:  25    Mini-Mental Status ExamTotal Score:     Baseline: 25  Most recent: 29    MEDICAL INFORMATION:    Allergies: Haldol [haloperidol lactate], Cyclosporine, Tetracycline, and Tramadol    Medical History: See ECT Consult    Surgical History: See ECT Consult    VITAL SIGNS:      Pre-procedure:    Vitals:    03/22/22 1009 03/22/22 1012 03/22/22 1015 03/22/22 1018   BP: (!) 171/92 158/82 143/86 159/78   Pulse: 85 80 78 81   Resp: 17 15 16 17    Temp: 37 ??C (98.6 ??F)      TempSrc: Temporal      SpO2: 94% 95% 98% 97%       Post-stimulus:     Intra-ECT VS Per Anesthesia for the past 24 hrs:   Pulse BP   03/22/22 0936 83 (!) 201/101     171/92  158/82    THYMATRON:  ECT # Thymatron Settings (%) Modification Seizure   Length Cuff Duration  (sec) EEG Duration  (sec)   1 100 Good Adequate 23 49       Medications given during procedure:   Current Outpatient Medications   Medication Sig Dispense Refill    cloNIDine HCL (CATAPRES) 0.1 MG tablet Take 2 tablets (0.2 mg total) by mouth nightly. Monitor for dizziness with standing. 60 tablet 2    famotidine (PEPCID) 20 MG tablet Take 1 tablet (20 mg total) by mouth two (2) times a day for 10 days. 20 tablet 0    hydrocortisone (ANUSOL-HC) 2.5 % rectal cream Insert into the rectum two (2) times a day. 30 g 0    hydrOXYzine (ATARAX) 25 MG tablet Take 2 tablets (50 mg total) by mouth daily as needed for anxiety. 60 tablet 2    sertraline (ZOLOFT) 100 MG tablet Take 1 tablet (100 mg total) by mouth daily. 30 tablet 2    sumatriptan (IMITREX) 50 MG tablet Take 1 tablet (50 mg total) by mouth every two (2) hours as needed for migraine. 60 tablet 0    XIFAXAN 550 mg Tab Take 1 tablet (550 mg total) by mouth Two (2) times a day. 180 tablet 4     No current facility-administered medications for this encounter.  Post-treatment complications: HTN responsive to labetelol 10 mg IV    Neck Collar Used:  Yes     PROGRESS NOTE:     Alexis Burns is a 57 y.o. female who presents for outpatient continuation ECT 4 weeks after her last treatment. She has been feeling more irritable, mood worse, more perseverative on the past, in the past week. Sleep has been worse, but still getting sleep. No SI. No safety concerns. Feels 3 week interval is best for her right now.    Mental Status Exam:  Appearance:    Well nourished and Well developed   Behavior:  Calm and Cooperative   Motor:   No abnormal movements   Speech/Language:    Normal rate, volume, tone, fluency and Language intact, well formed   Mood:   Irritable   Affect:   Calm, dysthymic, irritable   Thought process:   Logical, linear, clear, coherent, goal directed   Thought content:    Denies suicidal ideation   Perceptual disturbances:     Denies auditory and visual hallucinations, behavior not concerning for response to internal stimuli   Orientation:   Oriented to person, place, time, and general circumstances   Attention:   Able to fully attend without fluctuations in consciousness   Concentration:   Able to fully concentrate and attend Physical Exam:   Gen: NAD  Pulm: No increased work of breathing  Neuro: No tremors, tics, or abnormal movements    Assessment and Plan:   Mood Disorder: Maj Depress D/O, 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     Seizure was of adequate length, fair amplitude, fair morphology, and good post-ictal suppression.    Interventions Today:    Medication :   -Will continue to pre-treat with phenergan. Anesthesia feels one dose once a month would be alright.  Energy adjustments: none    Post-Treatment Complications: Hypertension treated with labetalol 25mg  IV (10 pre, 15 post)    Recommendations for Future Treatments:    Medication adjustments:  none  Energy adjustments:    none    Frequency: return in 3 weeks then increase as tolerated    Next Treatment Date: 04/12/22    Meds to take prior to ECT: None    Special Instructions: none    Assisted by: Kathaleen Bury, MD    Iran Planas, MD

## 2022-03-26 NOTE — Unmapped (Signed)
Complex Case Management  SUMMARY NOTE    Attempted to contact pt today at Cell number to introduce Complex Case Management services. Left message to return call.; 1st attempt    Discuss at next visit: Introduction to Complex Case Management      Jamesmichael Shadd - High Risk Care Coordinator   [facility]  [address], [address]  [place], [place] [address]  P: [phone] F: [phone]  [email or web address]

## 2022-03-28 DIAGNOSIS — R112 Nausea with vomiting, unspecified: Principal | ICD-10-CM

## 2022-03-28 MED ORDER — AMITRIPTYLINE 10 MG TABLET
ORAL_TABLET | Freq: Every evening | ORAL | 0 refills | 60 days | Status: CP
Start: 2022-03-28 — End: 2022-05-27

## 2022-04-01 DIAGNOSIS — K649 Unspecified hemorrhoids: Principal | ICD-10-CM

## 2022-04-01 MED ORDER — HYDROCORTISONE 2.5 % TOPICAL CREAM WITH PERINEAL APPLICATOR
Freq: Two times a day (BID) | RECTAL | 0 refills | 0 days | Status: CP
Start: 2022-04-01 — End: ?

## 2022-04-01 NOTE — Unmapped (Signed)
Complex Case Management  SUMMARY NOTE    Attempted to contact pt today at Home and Cell number to introduce Complex Case Management services. Left message to return call.; 2nd attempt, letter sent.    Discuss at next visit: No answer, letter sent      Nashua Homewood - High Risk Care Coordinator   [facility]  [address], [address]  [place],  [address]  P: [phone] F: [phone]  [email or web address]

## 2022-04-02 DIAGNOSIS — R112 Nausea with vomiting, unspecified: Principal | ICD-10-CM

## 2022-04-02 MED ORDER — DICYCLOMINE 20 MG TABLET
ORAL_TABLET | Freq: Four times a day (QID) | ORAL | 5 refills | 30 days | Status: CP | PRN
Start: 2022-04-02 — End: ?

## 2022-04-02 NOTE — Unmapped (Signed)
Received refill request from [facility] for Bentyl. Last seen 02/2022 by Dr. Shana Chute.  1 month rx supplied at that time with plan to return in 3 months.  6 month refill provided per protocol.

## 2022-04-07 DIAGNOSIS — R112 Nausea with vomiting, unspecified: Principal | ICD-10-CM

## 2022-04-07 MED ORDER — DICYCLOMINE 20 MG TABLET
ORAL_TABLET | Freq: Four times a day (QID) | ORAL | 5 refills | 30 days | PRN
Start: 2022-04-07 — End: ?

## 2022-04-08 NOTE — Unmapped (Signed)
[facility] Refill Coordination Note    Specialty Medication(s) to be Shipped:   Infectious Disease: Xifaxan    Other medication(s) to be shipped: No additional medications requested for fill at this time     Alexis Burns, DOB: 1965/10/27  Phone: [phone] (home)       All above HIPAA information was verified with patient.     Was a Nurse, learning disability used for this call? No    Completed refill call assessment today to schedule patient's medication shipment from the [facility] [phone]).  All relevant notes have been reviewed.     Specialty medication(s) and dose(s) confirmed: Regimen is correct and unchanged.   Changes to medications: Alexis Burns reports no changes at this time.  Changes to insurance: No  New side effects reported not previously addressed with a pharmacist or physician: None reported  Questions for the pharmacist: No    Confirmed patient received a Conservation officer, historic buildings and a Surveyor, mining with first shipment. The patient will receive a drug information handout for each medication shipped and additional FDA Medication Guides as required.       DISEASE/MEDICATION-SPECIFIC INFORMATION        N/A    SPECIALTY MEDICATION ADHERENCE     Medication Adherence    Patient reported X missed doses in the last month: 0  Specialty Medication: xifaxan 550mg                                 Were doses missed due to medication being on hold? No    Xifaxan 550mg   : 7 days of medicine on hand       REFERRAL TO PHARMACIST     Referral to the pharmacist: Not needed      [facility]     Shipping address confirmed in Epic.     Delivery Scheduled: Yes, Expected medication delivery date: 2/12.     Medication will be delivered via Same Day Courier to the prescription address in Epic [name].    [name]   [facility] Specialty Technician

## 2022-04-11 ENCOUNTER — Telehealth: Admit: 2022-04-11 | Discharge: 2022-04-12 | Payer: PRIVATE HEALTH INSURANCE | Attending: Clinical | Primary: Clinical

## 2022-04-11 DIAGNOSIS — F332 Major depressive disorder, recurrent severe without psychotic features: Principal | ICD-10-CM

## 2022-04-11 DIAGNOSIS — F419 Anxiety disorder, unspecified: Principal | ICD-10-CM

## 2022-04-11 MED ORDER — DICYCLOMINE 20 MG TABLET
ORAL_TABLET | Freq: Four times a day (QID) | ORAL | 5 refills | 30 days | PRN
Start: 2022-04-11 — End: ?

## 2022-04-11 NOTE — Unmapped (Signed)
I spent 55 minutes on video with the patient. I spent an additional 0 minutes on pre- and post-visit activities.  The patient was physically located in [place] or a state in which I am permitted to provide care. The patient understood that s/he may incur co-pays and cost sharing and agreed to the telemedicine visit. The visit was completed via phone and/or video, which was appropriate and reasonable under the circumstances given the patient's presentation at the time.     The patient has been advised of the potential risks and limitations of this mode of treatment (including, but not limited to, the absence of in-person examination) and has agreed to be treated using telemedicine. The patient's/patient's family's questions regarding telemedicine have been answered.      If the phone/video visit was completed in an ambulatory setting, the patient has also been advised to contact their provider???s office for worsening conditions and seek emergency medical treatment and/or call 911 if the patient deems necessary.        [facility]       STEP Patient Psychotherapy    Name: Alexis Burns  Date: 04/11/2022  MRN: [id number]  DOB: 07/08/1965  PCP: Aura Camps, FNP    Service Duration:  55 minutes        Service: Outpatient Therapy- Individual   [x]  Video     Date of Last Encounter:  Visit date not found    Mental Status/Behavioral Observations  Affect:  Mood congruent   Mood:   Calm, described as Level   Thought Process:  Goal directed and Linear   Behavior:   Cooperative and Direct eye contact   Self Harm: none and future oriented     Purpose of contact:    [x]   Continue to address treatment goals  []   Treatment Planning/Treatment progress review []  Discharge Planning     Interventions Provided:     [x]   CBT  []   Interpersonal Process Therapy []  Acceptance & Commitment Therapy (ACT)  []  DBT  []  Motivational Interviewing  []  Behavioral Activation []  Psycho-Education  []  Exposure Therapy  []  Trauma-Informed CBT [x]  Person Centered  [x]  Supportive Therapy    Patient Response/Progress:  Identifying Information: Alexis Burns is an [facility] patient since 2019 with a history of depression and anxiety, with past hospitalization in 2017 at [facility]. Over several years until late 2020, she was fairly stable.  She used to work in a Engineer, [employer] pre-pandemic in a stressful role, took time off for a leg injury, and then when she went back to work she was fired. She had an exacerbation of depression in May 2021 with her son's mental health as a major stressor, during 2021 she was admitted to the [facility] for suicidal ideation with a plan to shoot herself.   11/15/21 POC Target Outcomes: reduction in symptoms, including I can't turn my brain off, depression, anxiety, and aggravation/anger, increased independent functioning, specifically not being spoiled and dependent on her husband, reduction in aggresive ideation, employment, improved community integration, including avoiding isolation, and stop focusing on the negative.and stop taking things personally focus on good health.  Treatment/Process:   Patient joins today by video for individual therapy. This session was conducted as a clinical therapy visit to assess acute safety, monitor ongoing clinical treatment plan, and provide continuity to outpatient treatment in the setting of Covid19 Pandemic.  Patient appropriately engaged in the discussion and was able to follow the course  of the conversation; presenting calm, without indication of psychosis, cooperatively, and future oriented.   Clinician facilitated discussion of pt status, concerns, and POC with session content including:  Pt reports I think I have a new problem shop-aholic I have packages delivered almost daily. Discussion of shop-aholic vs I'm a hoarder noting her bedroom is spilling into the living-room. I can't keep going on with buying all the items.  Pt reports on significant memory gaps that are gone that I don't remember I do remember my dogs and family members.   Pt reports on her family, pros and cons.  Clinician processes session content with pt.    03/21/22  Pt reports wanting a short session today given where my brain is at.  Pt reports an ECT treatment tomorrow that she is dreading but knows she needs it because my attitude has been $#!^ for the past 2 weeks noting the sister situation (me thinking about it all the time and I get mad and take out on anyone around) and her son [name] is now married and will return to [place] in the new future. Pt reports limited involvement with him.  Pt reports that the old Jia has come back and I don't give a $#!^.  Pt finds that the ECT, while with its negatives, the positive is I feel like my head, my brain cannot accept truth and the ECT helps in part by helping her not to fixate on it.  Pt will going on a short vacation to the beach with her husband noting the positives of the situation.  Clinician processed session content with pt.    RISK ASSESSMENT: A suicide and violence risk assessment was performed as part of this evaluation.  While future psychiatric events cannot be accurately predicted, the patient does not currently require acute inpatient psychiatric care and does not currently meet [facility] involuntary commitment criteria.      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

## 2022-04-12 ENCOUNTER — Ambulatory Visit: Admit: 2022-04-12 | Discharge: 2022-04-13 | Payer: PRIVATE HEALTH INSURANCE

## 2022-04-12 ENCOUNTER — Encounter
Admit: 2022-04-12 | Discharge: 2022-04-13 | Payer: PRIVATE HEALTH INSURANCE | Attending: Student in an Organized Health Care Education/Training Program | Primary: Student in an Organized Health Care Education/Training Program

## 2022-04-12 MED ADMIN — ketorolac (TORADOL) injection: INTRAVENOUS | @ 17:00:00 | Stop: 2022-04-12

## 2022-04-12 MED ADMIN — Propofol (DIPRIVAN) injection: INTRAVENOUS | @ 16:00:00 | Stop: 2022-04-12

## 2022-04-12 MED ADMIN — ketamine (KETALAR) injection: INTRAVENOUS | @ 16:00:00 | Stop: 2022-04-12

## 2022-04-12 MED ADMIN — ondansetron (ZOFRAN) injection: INTRAVENOUS | @ 16:00:00 | Stop: 2022-04-12

## 2022-04-12 MED ADMIN — succinylcholine (ANECTINE) injection: INTRAVENOUS | @ 16:00:00 | Stop: 2022-04-12

## 2022-04-12 MED ADMIN — labetalol (NORMODYNE) injection: INTRAVENOUS | @ 17:00:00 | Stop: 2022-04-12

## 2022-04-12 MED ADMIN — sodium chloride (NS) 0.9 % infusion: INTRAVENOUS | @ 16:00:00 | Stop: 2022-04-12

## 2022-04-12 MED ADMIN — caffeine citrate (CAFCIT) injection: INTRAVENOUS | @ 16:00:00 | Stop: 2022-04-12

## 2022-04-12 NOTE — Unmapped (Signed)
[facility] ECT               Procedure Note                                           Requesting Attending Physician: Unknown Per Patient Referring  Admit Date: 04/12/2022     Service Type: Inpatient  Requesting Attending Physician: Unknown Per Patient Referring  Service requesting consult: Psychiatry  Consulting service: Psychiatry  Admit Date: 04/12/2022  Service Date: April 12, 2022    Time out was taken with staff to confirm correct patient and correct procedure to be performed.    INDICATION FOR ECT: Mood Disorder: Maj Depress D/O, REC  Severe WITHOUT psychotic behavior    TREATMENT HISTORY:    Total Number of Treatments: 26  Current Treatment #: 1  Treatment Type: Maintenance  Electrode Placement: Left Frontal Right Temporal    RATING SCALES:    CGI-Change score for ECT series: CGI-C: 2. Much improved    Beck Depression Inventory Total Score:     Baseline: 51  Most recent:  25    Mini-Mental Status ExamTotal Score:     Baseline: 25  Most recent: 29    MEDICAL INFORMATION:    Allergies: Haldol [haloperidol lactate], Cyclosporine, Tetracycline, and Tramadol    Medical History: See ECT Consult    Surgical History: See ECT Consult    VITAL SIGNS:      Pre-procedure:    There were no vitals filed for this visit.      Post-stimulus:         THYMATRON:  ECT # Thymatron Settings (%) Modification Seizure   Length Cuff Duration  (sec) EEG Duration  (sec)   1  2 100  100 Good  good inAdequate  adequate 17  18 19  31        Medications given during procedure:   Current Outpatient Medications   Medication Sig Dispense Refill    amitriptyline (ELAVIL) 10 MG tablet Take 1 tablet (10 mg total) by mouth nightly. 60 tablet 0    cloNIDine HCL (CATAPRES) 0.1 MG tablet Take 2 tablets (0.2 mg total) by mouth nightly. Monitor for dizziness with standing. 60 tablet 2    dicyclomine (BENTYL) 20 mg tablet Take 1 tablet (20 mg total) by mouth four (4) times a day as needed. 120 tablet 5    famotidine (PEPCID) 20 MG tablet Take 1 tablet (20 mg total) by mouth two (2) times a day for 10 days. 20 tablet 0    hydrocortisone (ANUSOL-HC) 2.5 % rectal cream Insert into the rectum two (2) times a day. 30 g 0    hydrOXYzine (ATARAX) 25 MG tablet Take 2 tablets (50 mg total) by mouth daily as needed for anxiety. 60 tablet 2    sertraline (ZOLOFT) 100 MG tablet Take 1 tablet (100 mg total) by mouth daily. 30 tablet 2    sumatriptan (IMITREX) 50 MG tablet Take 1 tablet (50 mg total) by mouth every two (2) hours as needed for migraine. 60 tablet 0    XIFAXAN 550 mg Tab Take 1 tablet (550 mg total) by mouth Two (2) times a day. 180 tablet 4     No current facility-administered medications for this encounter.        Post-treatment complications: HTN responsive to labetelol 10 mg IV    Neck Collar  Used:  Yes     PROGRESS NOTE:     Laquilla Angermeier is a 57 y.o. female who presents for outpatient continuation ECT 4 weeks after her last treatment. She has been feeling more irritable, mood worse, more perseverative on the past, in the past week. Sleep has been worse, but still getting sleep. No SI. No safety concerns. Feels 3 week interval is best for her right now.    Pt was intubate as she had spit up some fluid this am and has reflux.    Mental Status Exam:  Appearance:    Well nourished and Well developed   Behavior:  Calm and Cooperative   Motor:   No abnormal movements   Speech/Language:    Normal rate, volume, tone, fluency and Language intact, well formed   Mood:   Irritable   Affect:   Calm, dysthymic, irritable   Thought process:   Logical, linear, clear, coherent, goal directed   Thought content:    Denies suicidal ideation   Perceptual disturbances:     Denies auditory and visual hallucinations, behavior not concerning for response to internal stimuli   Orientation:   Oriented to person, place, time, and general circumstances   Attention:   Able to fully attend without fluctuations in consciousness   Concentration: Able to fully concentrate and attend     Physical Exam:   Gen: NAD  Pulm: No increased work of breathing  Neuro: No tremors, tics, or abnormal movements    Assessment and Plan:   Mood Disorder: Maj Depress D/O, 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     Seizure was of adequate length, fair amplitude, fair morphology, and good post-ictal suppression.    Interventions Today:    Medication :   -Will continue to pre-treat with phenergan. Anesthesia feels one dose once a month would be alright.  Energy adjustments: none    Post-Treatment Complications: Hypertension treated with labetalol 25mg  IV (10 pre, 15 post)    Recommendations for Future Treatments:    Medication adjustments:  none  Energy adjustments:    none    Frequency: return in 3 weeks then increase as tolerated  May continue to need labetolol    Next Treatment Date: 3/1    Meds to take prior to ECT: None    Special Instructions: none    Assisted by: Lonia Blood, MD

## 2022-04-15 MED FILL — XIFAXAN 550 MG TABLET: ORAL | 30 days supply | Qty: 60 | Fill #9

## 2022-04-26 ENCOUNTER — Ambulatory Visit
Admit: 2022-04-26 | Discharge: 2022-04-27 | Payer: PRIVATE HEALTH INSURANCE | Attending: Student in an Organized Health Care Education/Training Program | Primary: Student in an Organized Health Care Education/Training Program

## 2022-04-26 MED ORDER — CHOLESTYRAMINE-ASPARTAME 4 GRAM ORAL POWDER FOR SUSP IN A PACKET
PACK | Freq: Two times a day (BID) | ORAL | 1 refills | 90 days | Status: CP
Start: 2022-04-26 — End: 2022-10-23

## 2022-04-26 NOTE — Unmapped (Signed)
[facility]  [address]  [place], [place]  [address]  Ph: [phone]  Fax: [phone]     RETURN GENERAL GASTROENTEROLOGY VISIT    REFERRING PROVIDER: Launa Flight,*  PRIMARY CARE PROVIDER: Aura Camps, FNP    DATE OF SERVICE: 04/26/2022    ASSESSMENT AND PLAN:   Alexis Burns is a 57 y.o. female with history of MASH cirrhosis, chronic n/v, anxiety/depression seen in consultation for nausea and vomiting.    Nausea, vomiting, abdominal pain: Symptoms have been present for the past several months despite stopping Ozempic.  Underlying comorbidities include anxiety and depression which has been treated with electroconvulsive therapy in June 2023.  She is on an SSRI but has not felt this has improved her symptoms.  EGD performed in October 2023 showed no structural or pathologic issues to explain her symptoms.  Since last visit in December, she has been initiated on Bentyl which has significantly improved her vomiting from 4-5 times daily to 1-2 times per week.  Diarrhea continues to be an issue and after further discussion seems to be chronic since after her cholecystectomy.  Will trial cholestyramine today.  Regarding her upper abdominal symptoms, suspect functional nausea and vomiting.  She did take 2 doses of Elavil in the past week and self discontinued this medication though does not remember why she did so (blames this on the prior ECT).  She is willing to retrial this medication.  I encouraged her to continue this even if she does not notice a benefit since it will take at least 3 to 4 weeks to notice an effect.  If she is unable to tolerate Elavil, can trial Remeron or desipramine.  All questions answered, return in around 3 to 4 months.    - Start Elavil 10 mg daily for functional nausea and vomiting and dyspepsia  - Start cholestyramine 1/2 packet twice daily for likely bile acid related diarrhea  - Continue SSRI  - Okay to continue Bentyl as needed  -If unable to tolerate Elavil (that she self discontinued this in the past week though does not remember why) can trial Remeron    I spent a majority of my time today with the patient reviewing historical data, old records (including faxed data and data in Care Everywhere), performing a physical examination, and formulating an assessment and plan together with the patient.    Return to clinic in 3-4 months.     Lajoyce Lauber, MD  [employer] [facility]  Division of Gastroenterology & Hepatology  p: [phone]- f: [phone]    Subjective   SUBJECTIVE:     CHIEF COMPLAINT: vomiting    HISTORY OF PRESENT ILLNESS:      Alexis Burns is a 57 y.o. female with history of MASH cirrhosis, chronic n/v, anxiety/depression seen in consultation for nausea and vomiting.    Patient reports that she was last in her usual state of health and felt well around 1.5 years ago.  This was prior to starting Ozempic for her NASH cirrhosis.  Shortly after this, she started to develop nausea which her and her liver team attributed to the medication.  She tried to stick with this medication for a few months however her symptoms persisted she ended up discontinuing this in early 2023.    Unfortunately despite stopping the Ozempic, her symptoms persisted.  She was seen by her psychiatrist and started electroconvulsive therapy in around June 2023 which helped with her  mood but her nausea persisted.  In August 2023, her vomiting started and she describes that about 1 episode per day which has increased to around 2-3 episodes per day in the past few months.    She says she is able to eat her food with no issues and no emesis but will occasionally experience symptoms of nausea that will hit her at random times throughout the day with emesis that follows.  She describes the emesis as bilious.  She recently went to the ED a few days ago for acute worsening of the symptoms which she attributed to a viral gastroenteritis and was prescribed Bentyl and Prilosec which she feels have helped her symptoms.    Interval events:  Last seen in December 2023.  Started Bentyl 20 mg 4 times daily for intermittent abdominal pain, vomiting and diarrhea.  At the time, she says she was vomiting at least 4-5 times per day and is now vomiting only 1-2 times per week.  Diarrhea continues to be an issue, she is going 6-8 times per day.  Her liver team wants her to use lactulose but she refuses to do so.  Every now and then she will use Imodium to slow down her stools thought she uses this very infrequently.    MEDICATIONS:   Current Outpatient Medications   Medication Instructions    amitriptyline (ELAVIL) 10 mg, Oral, Nightly    cloNIDine HCL (CATAPRES) 0.2 mg, Oral, Nightly, Monitor for dizziness with standing.    dicyclomine (BENTYL) 20 mg, Oral, 4 times a day PRN    famotidine (PEPCID) 20 mg, Oral, 2 times a day (standard)    hydrocortisone (ANUSOL-HC) 2.5 % rectal cream Rectal, 2 times a day (standard)    hydrOXYzine (ATARAX) 50 mg, Oral, Daily PRN    sertraline (ZOLOFT) 100 mg, Oral, Daily (standard)    sumatriptan (IMITREX) 50 mg, Oral, Every 2 hours PRN    XIFAXAN 550 mg Tab Take 1 tablet (550 mg total) by mouth Two (2) times a day.          Objective   OBJECTIVE:   VITAL SIGNS: There were no vitals taken for this visit.  Wt Readings from Last 3 Encounters:   02/14/22 (!) 111.9 kg (246 lb 12.8 oz)   02/14/22 (!) 112.3 kg (247 lb 8 oz)   02/10/22 (!) 112.2 kg (247 lb 6.4 oz)       PHYSICAL EXAM:  Constitutional: Alert, Oriented x 3, No acute distress  HEENT: PERRL, conjunctiva clear, anicteric  CV: Regular rate and rhythm, normal S1, S2.   Lung: Clear to auscultation bilaterally. Unlabored breathing.   Abdomen: soft, normal bowel sounds, non-distended, non-tender. No organomegaly. No ascites.   Extremities: No edema, well perfused  Skin: No rashes, jaundice or skin lesions noted  Mental Status: Thought organized, appropriate affect    Vital Signs:  There were no vitals filed for this visit.      PREVIOUS VITALS AND ANTHROPOMETRIC DATA:  BP Readings from Last 3 Encounters:   04/12/22 139/66   03/22/22 159/78   02/22/22 152/89    Pulse Readings from Last 3 Encounters:   04/12/22 75   03/22/22 81   02/22/22 95    Wt Readings from Last 3 Encounters:   02/14/22 (!) 111.9 kg (246 lb 12.8 oz)   02/14/22 (!) 112.3 kg (247 lb 8 oz)   02/10/22 (!) 112.2 kg (247 lb 6.4 oz)      BMI:   BMI Readings from  Last 1 Encounters:   02/14/22 39.83 kg/m??       Joella Prince, MD  Gastroenterology Fellow, PGY-4  [employer] [place]     Portions of this record may have been created using [employer]. Dictation errors have been sought, but may not have all been identified and corrected.

## 2022-04-26 NOTE — Unmapped (Signed)
[facility] at [facility]  Phone: [phone]         Thank you for coming in today, it was a pleasure to see you and it is an honor to help take care of you.     After Visit Instructions:  -- start cholestyramine 1/2 pack twice per day for the diarrhea. Take your other medications as directed by your doctor, usually at least 1 hour before or 4  hours after  -- RESTART elevail 10 mg nightly. Stick with this. If you have to discontinue this please let me know  -- continue to use bentyl as needed    -- I will see you back in clinic in 3 months. If you need to contact me, you may either send me a MyChart message or call the office and ask to speak with Zella Ball (the nurse that works with me). MyChart messages are for non-urgent questions and may take up to 5 days to receive a response.     -------------------------------------------------------------------------------------------------------  Thank you for visiting our Gastroenterology Clinic!    Important phone numbers:     GI Clinic and Nutrition Appointments:  [phone] option 1 for appointments, then option 1 for clinic appointments.  Please call the GI Clinic appointment line if you need to schedule, reschedule, or cancel an appointment in clinic. They can also answer any questions you may have about where your appointment is located and when you need to arrive.       GI Procedure Appointments:  [phone] option 1 for appointments, then option 2 for procedure appointments.  Please call the GI Procedures line if you need to schedule, reschedule, or cancel any type of GI procedure (endoscopy, colonoscopy, motility testing, etc).  You can also call this number for prep instructions, etc.       Radiology: [phone]  If you are being scheduled for any type of radiology study, you will need to call to receive your appointment time.  Please call this number for information.        For emergencies after normal business hours or on weekends/holidays  Proceed to the nearest emergency room OR contact the [facility] Operator at [phone] who can page the Gastroenterology Fellow on call.      TEST RESULTS  If you have a MyChart account, your new results will be sent to you through your MyChart account at [email or web address]. For results that require follow-up, a member of your healthcare team will also contact you directly.    PRESCRIPTION REFILL REQUESTS  To request prescription refills, please contact your pharmacy or send your healthcare team a message through your MyChart account at [email or web address]  RECORD REQUESTS  For questions related to medical records, please call Medical Records Release of Information at [phone]  Georgena Spurling  For billing and other financial questions/needs - please contact [phone]. If you need to leave a message, please be sure to leave your full name, date of birth or MR#, best call back # and reason for call.    Sincerely,  Raynelle Highland. Shana Chute, MD  Division of Gastroenterology and Hepatology

## 2022-04-30 ENCOUNTER — Telehealth: Admit: 2022-04-30 | Discharge: 2022-05-01 | Payer: PRIVATE HEALTH INSURANCE

## 2022-04-30 DIAGNOSIS — F411 Generalized anxiety disorder: Principal | ICD-10-CM

## 2022-04-30 DIAGNOSIS — F32A Depression, unspecified depression type: Principal | ICD-10-CM

## 2022-04-30 DIAGNOSIS — F959 Tic disorder, unspecified: Principal | ICD-10-CM

## 2022-04-30 MED ORDER — CLONIDINE HCL 0.1 MG TABLET
ORAL_TABLET | Freq: Every evening | ORAL | 2 refills | 30 days | Status: CP
Start: 2022-04-30 — End: ?

## 2022-04-30 MED ORDER — HYDROXYZINE HCL 25 MG TABLET
ORAL_TABLET | Freq: Every day | ORAL | 2 refills | 30 days | Status: CP | PRN
Start: 2022-04-30 — End: ?

## 2022-04-30 MED ORDER — SERTRALINE 100 MG TABLET
ORAL_TABLET | Freq: Every day | ORAL | 2 refills | 30 days | Status: CP
Start: 2022-04-30 — End: 2022-07-29

## 2022-04-30 NOTE — Unmapped (Signed)
Follow-up instructions:  -- Please continue taking your medications as prescribed for your mental health.   -- Do not make changes to your medications, including taking more or less than prescribed, unless under the supervision of your physician. Be aware that some medications may make you feel worse if abruptly stopped  -- Please refrain from using illicit substances, as these can affect your mood and could cause anxiety or other concerning symptoms.   -- Seek further medical care for any increase in symptoms or new symptoms such as thoughts of wanting to hurt yourself or hurt others.     Contact info:  Life-threatening emergencies: Call 911, the 988 suicide and crisis lifeline, or go to the nearest ER for medical or psychiatric attention.           Issues that need urgent attention but are not life threatening: Call the clinic outpatient front desk for assistance. [phone] for our [facility] located at [address]. [phone] for our [facility].    Non-urgent routine concerns and questions: Send a message through [facility] or call our clinic front desk.    Refill requests: Check with your pharmacy to initiate refill requests.    Regarding appointments:  - If you need to cancel your appointment, we ask that you call your clinic at least 24 hours before your scheduled appointment. [phone] for our [place] clinic located at [address]. [phone] for our [facility].  - If for any reason you arrive 15 minutes later than your scheduled appointment time, you may not be seen and your visit may be rescheduled.  - Please remember that we will not automatically reschedule missed appointments.  - If you miss two (2) appointments without letting us know in advance, you will likely be referred to a provider in your community.  - We will do our best to be on time. Sometimes an emergency will arise that might cause your clinician to be late. We will try to inform you of this when you check in for your appointment. If you wait more than 15 minutes past your appointment time without such notice, please speak with the front desk staff.    In the event of bad weather, the clinic staff will attempt to contact you, should your appointment need to be rescheduled. Additionally, you can call the Patient Weather Line [phone] for system-wide clinic status    For more information and reminders regarding clinic policies (these were provided when you were admitted to the clinic), please ask the front desk.

## 2022-04-30 NOTE — Unmapped (Unsigned)
[facility]  Psychiatry   Established Patient E&M Service - Outpatient       Assessment:  Alexis Burns is a 57 y.o. female with a history of MDD and GAD who presents for follow-up evaluation. [name] continues to report ongoing stability, though she is currently grieving the loss of her brother-in-law. She says this has allowed her to re-connect with her sister who she has previously had conflict with. She reports that she has decided to stop maintenance ECT given side effects (recent facial bruising, memory issues) and would like to continue with medication and therapy. She continues to follow [facility] for chronic nausea and vomiting with etiology possibly functional. No changes today. Will monitor for recurrence of symptoms in setting of discontinuing ECT. Plan to follow up in two months. Patient expressed understanding and agrees with plan.     Identifying Information: Alexis Burns is an [facility] patient since 2019 with a history of major depressive disorder and generalized anxiety disorder, with past hospitalization in 2017 at [facility]. In addition, her history of emotional dysregulation, poor distress tolerance, suspicion of others, and splitting appears consistent with borderline personality traits. Past medication trials include Prozac (stopped working), risperidone (brief trial), lithium (intolerable side effects), buspirone (unhelpful at 20mg  TID), duloxetine (90mg  well-tolerated but not helpful on its own), aripiprazole (as adjunct for duloxetine, eventually stopped due to tremors), Wellbutrin (anxiety, dry mouth), Seroquel (ineffective), Cytomel (dry mouth, joint pain). Over several years until late 2020, she was fairly stable on duloxetine 90mg  and Abilify 5mg . She used to work in a [employer], [employer] pre-pandemic in a stressful role, took time off for a leg injury, and then when she went back to work she was fired. This was destabilizing, and it was closely followed by tibial tendon surgery in and then suspected COVID infection. By Feb 2021, mood remained unstable, but Abilify was causing significant shakiness even at tapered doses, and she discontinued it. She had an exacerbation of depression in May 2021 with her son's mental health as a major stressor, and after intolerance of increasing Cymbalta to 120mg , ineffectiveness of Seroquel, intolerance of Wellbutrin, and time-limited benefit of risperidone, in September 2021 she was admitted to the [facility] for suicidal ideation with a plan to shoot herself. They started Cytomel but she did not tolerate it. She noticed some abnormal tongue movements while on Abilify and these worsened after Abilify discontinuation. We considered the possibility of TD, but neurology evaluation labeled the tongue movements as tic-like, and they also diagnosed her with essential tremor. She started to show signs of liver dysfunction on her bloodwork, and in December 2021 we made the switch from duloxetine to Prozac. This was tolerated well and effective for mood, and mood has varied with family stressors. Both insomnia and hypersomnia have been problems for [name], and prns have been very helpful, by spring 2022 sleep had improved with more physical activity. [name] was eventually restarted on Klonopin in Spring 2023 given severe anxiety/panic that caused her to be unable to leave her house for several weeks. Additionally, she experienced recurrence of depressive symptoms and passive suicidal ideation. Given how well she had done on an SNRI in the past, decision was made to cross taper Prozac to Pristiq. Given continued depressed mood, [name] was subsequently referred to interventional psychiatry ECT clinic in April 2023. Due to severity of symptoms, [name] was directly admitted to [facility] to start inpatient ECT and she was transitioned from Pristiq to sertraline. Klonopin  was discontinued and Hydroxyzine PRN was started to target anxiety. She continued ECT as an outpatient. Risk Assessment:  A suicide and violence risk assessment was performed at our last visit, and safety assessment remains uncharged: she is not at acute risk of harm to herself but is at chronic risk, and we will continue assessing safety at future evaluations.  Stressors: death of father in 07-Sep-2012 and mother in 2015-09-08, chronic depressive symptoms, financial stressors, unemployment, global pandemic and likely COVID19 infection, stressful relationships by son (improving).      Plan:  Problem: MDD - Borderline Personality Traits  Status: improved or improving  Interventions:  - Continue sertraline 100 mg daily   - Patient discontinuing maintenance ECT, will resume if her symptoms worsen  - Continue therapy with [name]    Problem: GAD  Status of problem: improved or improving   - Continue hydroxyzine 50 mg daily as needed for anxiety  - Continue clonidine 0.2 mg nightly    Problem: Nausea and vomiting, r/o somatic symptom disorder  Status of problem: chronic  Interventions:  - Has been evaluated at [facility], currently followed by [facility]   - Upper endoscopy on 10/25 remarkable for mild, chronic gastritis w/ erosion in duodenum and stomach   - Consider Remeron in future visits, given antiemetic effect, though pt declined    Problem: Abnormal tongue movements  Status of problem: chronic  Interventions:  - Neurology labeled her tongue movements as tic-related, and suggested therapies to help. We had considered if this was a symptoms of tardive dyskinesia as it worsened after Abilify discontinuation which can happen. It originally diminished with stopping artifical sweeteners, but then came back. Neurology's assessment is reassuring  - Clonidine, as above    Psychotherapy:  No billable psychotherapy service provided.    Patient has been given this writer's contact information as well as the [facility] urgent line number. The patient has been instructed to call 911 for emergencies.    Patient and plan of care were discussed with the Attending MD, Dr. Gerrit Friends, who agrees with the above statement and plan.    Jiles Crocker, MD  [facility] PGY2    Subjective:     Psychiatric Chief Concern:  Follow-up psychiatric evaluation for mood    Interval History:  Have made some decisions somebody might not be happy with. Last ECT - had vomited beforehand, says session seemed to go okay, when she got home, her face was bruised/swollen, throat pain. Having neck pain related to procedure. Thinks she may have vomited during the procedure, though not sure. Says it was a frightening experience, and thinks she is going to stop with the maintenance ECT and will reconsider ECT if depression/symptoms worsen again. Wants to just try medication and therapy. ECT has also affected her memory which she is frustrated by, have made it tough to work due to short term memory issues. Has told Raina Mina about decision to stop ECT. No concerns with current meds. Otherwise everything is good. Have had some medical medication changes related to stomach issues (N/V). Thinks she may have functional nausea/vomiting, taking medication to treat symptoms. Finds herself tired a lot, continues to work from home, will nap. Wishes she had some energy. Has good nights/bad nights in terms of sleep, sometimes will sleep all the way through. Has difficulty staying asleep often. Nauseaus every day, vomit 2-3x/week. Therapy with Loraine Leriche is going well. Seeing once/every 3 weeks. Denies SI. Had a death in the family last week (  brother-in-law) passed away from leukemia which has been hard. Has allowed her to re-connect with her sister-in-law, who previously had conflict with.     Objective:    Mental Status Exam:  Appearance:    Appears stated age, Well nourished and Clean/Neat, sitting in chair in front of screen   Motor:   No abnormal movements or tics noted.    Speech/Language:    Normal rate, volume, tone, fluency and Language intact, well formed   Mood:   Good Affect:   Full, euthymic, mood congruent   Thought process and Associations:   Logical, linear, clear, coherent, goal directed   Abnormal/psychotic thought content:     Denies SI/HI.   Perceptual disturbances:     Behavior not concerning for response to internal stimuli     Other:   -     The patient reports they are physically located in [place] and is currently: at home. I conducted a audio/video visit. I spent  46m 01s on the video call with the patient. I spent an additional 30 minutes on pre- and post-visit activities on the date of service . during the day to help with tics, pt declined.     Objective:    Mental Status Exam:  Appearance:    Appears stated age, Well nourished and Clean/Neat, sitting in chair in front of screen   Motor:   No abnormal movements or tics noted.    Speech/Language:    Normal rate, volume, tone, fluency and Language intact, well formed   Mood:   okay   Affect:   Full, euthymic, mood congruent   Thought process and Associations:   Logical, linear, clear, coherent, goal directed   Abnormal/psychotic thought content:     Did not endorse SI/HI   Perceptual disturbances:     Behavior not concerning for response to internal stimuli     Other:   -   {    Coding tips - Do not edit this text, it will delete upon signing of note!    Telephone visits [phone] for Physicians and APPs and [phone] for Non- Physician Clinicians)- Only use minutes on the phone to determine level of service.    Video visits [phone]) - Use either level of medical decision making just as an in-person visit OR time which includes both minutes on video and pre/post minutes to determine the level of service.      :[id number]}  The patient reports they are physically located in [place] and is currently: at home. I conducted a audio/video visit. I spent  0s on the video call with the patient. I spent an additional 30 minutes on pre- and post-visit activities on the date of service .

## 2022-05-02 ENCOUNTER — Ambulatory Visit: Admit: 2022-05-02 | Discharge: 2022-05-03 | Payer: PRIVATE HEALTH INSURANCE

## 2022-05-02 ENCOUNTER — Telehealth: Admit: 2022-05-02 | Discharge: 2022-05-03 | Payer: PRIVATE HEALTH INSURANCE | Attending: Clinical | Primary: Clinical

## 2022-05-02 DIAGNOSIS — M79621 Pain in right upper arm: Principal | ICD-10-CM

## 2022-05-02 DIAGNOSIS — N644 Mastodynia: Principal | ICD-10-CM

## 2022-05-02 DIAGNOSIS — F32A Depression, unspecified depression type: Principal | ICD-10-CM

## 2022-05-02 DIAGNOSIS — F411 Generalized anxiety disorder: Principal | ICD-10-CM

## 2022-05-02 LAB — CBC W/ AUTO DIFF
BASOPHILS ABSOLUTE COUNT: 0 10*9/L (ref 0.0–0.1)
BASOPHILS RELATIVE PERCENT: 1 %
EOSINOPHILS ABSOLUTE COUNT: 0.2 10*9/L (ref 0.0–0.5)
EOSINOPHILS RELATIVE PERCENT: 5.3 %
HEMATOCRIT: 40.5 % (ref 34.0–44.0)
HEMOGLOBIN: 13.4 g/dL (ref 11.3–14.9)
LYMPHOCYTES ABSOLUTE COUNT: 1.4 10*9/L (ref 1.1–3.6)
LYMPHOCYTES RELATIVE PERCENT: 37.7 %
MEAN CORPUSCULAR HEMOGLOBIN CONC: 32.9 g/dL (ref 32.0–36.0)
MEAN CORPUSCULAR HEMOGLOBIN: 28.2 pg (ref 25.9–32.4)
MEAN CORPUSCULAR VOLUME: 85.7 fL (ref 77.6–95.7)
MEAN PLATELET VOLUME: 8.2 fL (ref 6.8–10.7)
MONOCYTES ABSOLUTE COUNT: 0.3 10*9/L (ref 0.3–0.8)
MONOCYTES RELATIVE PERCENT: 8.4 %
NEUTROPHILS ABSOLUTE COUNT: 1.8 10*9/L (ref 1.8–7.8)
NEUTROPHILS RELATIVE PERCENT: 47.6 %
NUCLEATED RED BLOOD CELLS: 0 /100{WBCs} (ref <=4)
PLATELET COUNT: 60 10*9/L — ABNORMAL LOW (ref 150–450)
RED BLOOD CELL COUNT: 4.73 10*12/L (ref 3.95–5.13)
RED CELL DISTRIBUTION WIDTH: 15 % (ref 12.2–15.2)
WBC ADJUSTED: 3.8 10*9/L (ref 3.6–11.2)

## 2022-05-02 NOTE — Unmapped (Signed)
 Assessment & Plan        I personally spent 21 minutes face-to-face and non-face-to-face in the care of this patient, which includes all pre, intra, and post visit time on the date of service.           Problem List Items Addressed This Visit       Breast pain, right - Primary     Patient with pain in her right axilla radiating into her right lateral breast x 2 weeks. Of note patient has a PMH of two abscesses (one in R breast and one in R axilla, years ago) both requiring surgical resection.   - Ordered dx mammo and [place]  - Checking CBC to r/o infectious etiology  - If all eval nl, likely muscle pain from recent heavy lifting          Relevant Orders    CBC w/ Differential (Completed)    MAMMO DIGITAL DIAGNOSTIC BILATERAL    US Breast Complete Right     Other Visit Diagnoses       Pain, axillary, right        Relevant Orders    CBC w/ Differential (Completed)    MAMMO DIGITAL DIAGNOSTIC BILATERAL    US Breast Complete Right             Return if symptoms worsen or fail to improve.     Medication adherence and barriers to the treatment plan have been addressed. Opportunities to optimize healthy behaviors have been discussed. Patient / caregiver voiced understanding.        Subjective:     Alexis Burns is a 57 y.o. female who presents for Muscle Pain (Right axilla, radiates into right breast; appx 2 weeks)            History of Present Illness     HPI       Muscle Pain     Additional comments: Right axilla, radiates into right breast; appx 2 weeks          Last edited by Lendell Caprice, CMA on 05/02/2022 10:07 AM.        Patient presents for pain from right axilla radiating to breast. Never seen by me before.     Seen by Dr. Paulla Dolly [facility] 02/14/22 - GOALS: 1) Return in 6 mos after focusing on healing from n/v     Seen by Psych 04/12/22 for ECT with Edation    Seen by GI 04/26/22 - N/V - Start Elavil 10 mg daily for functional nausea and vomiting and dyspepsia - Start cholestyramine 1/2 packet twice daily for likely bile acid related diarrhea - Continue SSRI - Okay to continue Bentyl as needed -If unable to tolerate Elavil (that she self discontinued this in the past week though does not remember why) can trial Remeron         Last mammo 9/21 with abnl R breast asymmetry (History of right breast abscess scar.), dx mammo 10/21 nl.     Patient reports she has been having pain in her right axilla radiating into her right breast x 2 weeks. Endorses PMH of 2 abscesses in the past. Denies fevers/sweats/ chills as of late. Denies erythema / rash over the tender area. Patient reports one of her abscesses was in her right axilla and the other was in the right breast. Patient reports one of her abscesses was 30 years ago and the most recent abscess was 2-3 years ago. Patient reports the both abscesses required  surgical resection. Denies new nipple discharge or skin changes. Patient is unsure if these sxs are related to her recent diagnosis of cirrhosis or if it is related to ECT. Discussed lack of evidence concerning this. 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          Review of Systems     History obtained from the patient and chart review     Unless otherwise stated in the HPI:  CONSTITUTIONAL: no f/c/s  HEENT: Eyes: No diplopia or blurred vision. ENT: No earache, sore throat or runny nose.   CARDIOVASCULAR: No pressure, squeezing, strangling, tightness, heaviness or aching about the chest, neck, axilla or epigastrium.   RESPIRATORY: No cough, shortness of breath, PND or orthopnea.   GASTROINTESTINAL: No nausea, vomiting or diarrhea.   GENITOURINARY: No dysuria, frequency or urgency.   MUSCULOSKELETAL: As per HPI.   SKIN: No change in skin, hair or nails.   NEUROLOGIC: No paresthesias, fasciculations, seizures or weakness.   PSYCHIATRIC: No disorder of thought or mood.   ENDOCRINE: No heat or cold intolerance, polyuria or polydipsia. HEMATOLOGICAL: No easy bruising or bleeding.            Review of History     MEDICATIONS:        Current Outpatient Medications on File Prior to Visit   Medication Sig Dispense Refill    amitriptyline (ELAVIL) 10 MG tablet Take 1 tablet (10 mg total) by mouth nightly. (Patient taking differently: Take 2 tablets (20 mg total) by mouth nightly.) 60 tablet 0    cholestyramine-aspartame (CHOLESTYRAMINE LIGHT) 4 gram PwPk Take 0.5 packets by mouth two (2) times a day. 90 packet 1    cloNIDine HCL (CATAPRES) 0.1 MG tablet Take 2 tablets (0.2 mg total) by mouth nightly. Monitor for dizziness with standing. 60 tablet 2    dicyclomine (BENTYL) 20 mg tablet Take 1 tablet (20 mg total) by mouth four (4) times a day as needed. 120 tablet 5    hydrocortisone (ANUSOL-HC) 2.5 % rectal cream Insert into the rectum two (2) times a day. 30 g 0    hydrOXYzine (ATARAX) 25 MG tablet Take 2 tablets (50 mg total) by mouth daily as needed for anxiety. 60 tablet 2    sertraline (ZOLOFT) 100 MG tablet Take 1 tablet (100 mg total) by mouth daily. 30 tablet 2    sumatriptan (IMITREX) 50 MG tablet Take 1 tablet (50 mg total) by mouth every two (2) hours as needed for migraine. 60 tablet 0    XIFAXAN 550 mg Tab Take 1 tablet (550 mg total) by mouth Two (2) times a day. 180 tablet 4    famotidine (PEPCID) 20 MG tablet Take 1 tablet (20 mg total) by mouth two (2) times a day for 10 days. 20 tablet 0     No current facility-administered medications on file prior to visit.        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    Obesity Since childhood    Seizures (CMS-HCC) ect treatments       PAST SURGICAL HISTORY:     Past Surgical History:   Procedure Laterality Date    BREAST BIOPSY Left     many biopsies    BREAST CYST ASPIRATION Right 2003    abcess    BREAST EXCISIONAL BIOPSY Left 2014ish    CESAREAN SECTION      CHOLECYSTECTOMY      Electro Convulsive/Shock Therapy  04/01/2022    HERNIA REPAIR  2020    HYSTERECTOMY  2001    partial    OOPHORECTOMY      still has one ovary    PR COLONOSCOPY W/BIOPSY SINGLE/MULTIPLE N/A 04/19/2020    Procedure: COLONOSCOPY, FLEXIBLE, PROXIMAL TO SPLENIC FLEXURE; WITH BIOPSY, SINGLE OR MULTIPLE;  Surgeon: Luanne Bras, MD;  [facility];  Service: Gastroenterology    PR REPAIR INCISIONAL HERNIA,STRANG Midline 09/25/2018    Procedure: REPAIR INITIAL INCISIONAL OR VENTRAL HERNIA; INCARCERATED OR STRANGULATED;  Surgeon: Colon Branch, MD;  [facility]: [facility];  Service: General Surgery    PR SHLDR ARTHROSCOP,SURG,W/ROTAT CUFF REPR Left 09/25/2020    Procedure: ARTHROSCOPY, SHOULDER, SURGICAL; WITH ROTATOR CUFF REPAIR;  Surgeon: Tomasa Rand, MD;  [facility];  Service: Orthopedics    PR UPPER GI ENDOSCOPY,BIOPSY N/A 12/26/2021    Procedure: UGI ENDOSCOPY; WITH BIOPSY, SINGLE OR MULTIPLE;  Surgeon: Kela Millin, MD;  [facility];  Service: Gastroenterology    PR UPPER GI ENDOSCOPY,DIAGNOSIS N/A 04/19/2020    Procedure: UGI ENDO, INCLUDE ESOPHAGUS, STOMACH, & DUODENUM &/OR JEJUNUM; DX W/WO COLLECTION SPECIMN, BY BRUSH OR WASH;  Surgeon: Luanne Bras, MD;  Location: [facility];  Service: Gastroenterology    WISDOM TOOTH EXTRACTION         PROBLEM LIST:     Patient Active Problem List    Diagnosis Date Noted    Breast pain, right 05/02/2022    Jaw pain 09/12/2021    Severe episode of recurrent major depressive disorder, 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) 07/19/2021    Psychophysiological insomnia 05/31/2021    Obstructive sleep apnea syndrome, mild 04/09/2021    Mild cognitive impairment 04/09/2021    Prediabetes 04/05/2021    Folliculitis of right axilla 12/28/2020    Skin yeast infection 12/28/2020    Cirrhosis (CMS-HCC)     Anxiety 09/19/2020    Hypokalemia 02/18/2019    Ventral hernia without obstruction or gangrene 09/22/2018    Chronic migraine 05/21/2016    Essential hypertension 04/11/2016    Abdominal hernia 04/11/2016    Mild intermittent asthma without complication 12/15/2015    Class 2 severe obesity with serious comorbidity and body mass index (BMI) of 38.0 to 38.9 in adult (CMS-HCC) 03/21/2015    Severe recurrent major depression 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) 03/14/2015    Binge eating disorder 09/27/2014    Hyperlipidemia, unspecified 09/27/2014    Attention deficit disorder 09/30/2013           SOCIAL HISTORY:      reports that she quit smoking about 28 years ago. Her smoking use included cigarettes. She started smoking about 43 years  ago. She has a 15 pack-year smoking history. She has never used smokeless tobacco. She reports that she does not currently use alcohol. She reports that she does not use drugs.   FAMILY HISTORY:     family history includes Alcohol abuse in her father, paternal aunt, paternal grandmother, and paternal uncle; Arthritis in her mother; Cancer in her mother and son; Cancer (age of onset: 58) in her father; Colon cancer (age of onset: 46) in her father; Depression in her father, maternal aunt, maternal grandmother, sister, and son; Diabetes in her mother and son; Drug abuse in her paternal aunt, paternal uncle, and son; Heart disease in her paternal grandfather; Hypertension in her father, mother, and son; Intellectual Disability in her son; Kidney disease in her mother and son; Learning disabilities in her son; Lymphoma in her mother; Macular degeneration in her father, sister, and sister; Mental illness in her son and son; Migraines in her sister and sister; Miscarriages / Stillbirths in her sister; Neuropathy in her son; Parkinsonism in her father and sister; Seizures in her sister; Skin cancer in her maternal grandmother, mother, and sister; Stroke in her maternal grandmother; Tremor in her sister and sister; Vision loss in her father, mother, sister, sister, and son.       Objective:    BP 119/81  - Pulse 77  - Temp 36.8 ??C (98.2 ??F)  - Ht 167.6 cm (5' 5.98)  - Wt (!) 119.5 kg (263 lb 6.4 oz)  - BMI 42.53 kg/m??  - Body mass index is 42.53 kg/m??.   Physical exam:    General:  Well appearing, well nourished in no distress.   Skin: no obvious rash or  prominent lesions    Cardiovascular: no obvious JVD. RRR no murmur.  Eyes:  conjunctiva clear, sclera non-icteric   Ears/nose/throat: no obvious external deformities  Respiratory: no distress. CTAB.   Musculoskeletal:  Normal gait and station.  Neurologic: moves extremities symmetrically, cranial nerves grossly intact  Hematologic: no obvious ecchymosis     Psychiatric:  Oriented X3, intact judgement and insight, normal mood and affect.   Breasts: no nipple abnormality, dominant masses, axillary or supraclavicular adenopathy, no concerning skin changes. Right breast with well healed surgical linear scar lateral aspect. + TTP adjacent to scar 4 cm from areola at 8 o' clock. No skin change here, no underlying masses or fluctuance. Milder diffuse TTP throughout lateral right breast and right axilla.         Recent Results (from the past 24 hour(s))   CBC w/ Differential    Collection Time: 05/02/22 11:03 AM   Result Value Ref Range    WBC 3.8 3.6 - 11.2 10*9/L    RBC 4.73 3.95 - 5.13 10*12/L    HGB 13.4 11.3 - 14.9 g/dL    HCT 24.4 01.0 - 27.2 %    MCV 85.7 77.6 - 95.7 fL    MCH 28.2 25.9 - 32.4 pg    MCHC 32.9 32.0 - 36.0 g/dL    RDW 53.6 64.4 - 03.4 %    MPV 8.2 6.8 - 10.7 fL    Platelet 60 (L) 150 - 450 10*9/L    nRBC 0 <=4 /100 WBCs    Neutrophils % 47.6 %    Lymphocytes % 37.7 %    Monocytes % 8.4 %    Eosinophils % 5.3 %    Basophils % 1.0 %    Absolute Neutrophils 1.8 1.8 - 7.8 10*9/L    Absolute Lymphocytes  1.4 1.1 - 3.6 10*9/L    Absolute Monocytes 0.3 0.3 - 0.8 10*9/L    Absolute Eosinophils 0.2 0.0 - 0.5 10*9/L    Absolute Basophils 0.0 0.0 - 0.1 10*9/L        I attest that I, Paulino Rily, personally documented this note while acting as scribe for Arlyss Gandy, MD.      Paulino Rily, Scribe.  05/02/2022     The documentation recorded by the scribe accurately reflects the service I personally performed and the decisions made by me.    Arlyss Gandy, MD

## 2022-05-02 NOTE — Unmapped (Addendum)
Patient with pain in her right axilla radiating into her right lateral breast x 2 weeks. Of note patient has a PMH of two abscesses (one in R breast and one in R axilla, years ago) both requiring surgical resection.   - Ordered dx mammo and [place]  - Checking CBC to r/o infectious etiology  - If all eval nl, likely muscle pain from recent heavy lifting

## 2022-05-02 NOTE — Unmapped (Signed)
I spent 56 minutes on video with the patient. I spent an additional 0 minutes on pre- and post-visit activities.  The patient was physically located in [place] or a state in which I am permitted to provide care. The patient understood that s/he may incur co-pays and cost sharing and agreed to the telemedicine visit. The visit was completed via phone and/or video, which was appropriate and reasonable under the circumstances given the patient's presentation at the time.  The patient has been advised of the potential risks and limitations of this mode of treatment (including, but not limited to, the absence of in-person examination) and has agreed to be treated using telemedicine. The patient's/patient's family's questions regarding telemedicine have been answered.   If the phone/video visit was completed in an ambulatory setting, the patient has also been advised to contact their provider???s office for worsening conditions and seek emergency medical treatment and/or call 911 if the patient deems necessary.     [facility]       STEP Patient Psychotherapy    Name: Alexis Burns  Date: 05/02/2022  MRN: [id number]  DOB: [birth year]  PCP: Aura Camps, FNP    Service Duration:  56 minutes        Service: Outpatient Therapy- Individual   [x]  Video     Date of Last Encounter:  Visit date not found    Mental Status/Behavioral Observations  Affect:  Mood congruent   Mood:   sad   Thought Process:  Goal directed and Linear   Behavior:   Cooperative and Direct eye contact   Self Harm: none and future oriented     Purpose of contact:    [x]   Continue to address treatment goals  []   Treatment Planning/Treatment progress review []  Discharge Planning     Interventions Provided:     [x]   CBT  []   Interpersonal Process Therapy []  Acceptance & Commitment Therapy (ACT)  []  DBT  []  Motivational Interviewing  []  Behavioral Activation                               []  Psycho-Education  []  Exposure Therapy  []  Trauma-Informed CBT [x]  Person Centered  [x]  Supportive Therapy    Patient Response/Progress:  Identifying Information: Alexis Burns is an [facility] patient since 2019 with a history of major depressive disorder and generalized anxiety disorder, with past hospitalization in 2017 at [facility]. In addition, her history of emotional dysregulation, poor distress tolerance, suspicion of others, and splitting appears consistent with borderline personality traits. She had an exacerbation of depression in May 2021 with her son's mental health as a major stressor, during 2021 she was admitted to the [facility] for suicidal ideation with a plan to shoot herself.   11/15/21 POC Target Outcomes: reduction in symptoms, including I can't turn my brain off, depression, anxiety, and aggravation/anger, increased independent functioning, specifically not being spoiled and dependent on her husband, reduction in aggresive ideation, employment, improved community integration, including avoiding isolation, and stop focusing on the negative.and stop taking things personally focus on good health.  Treatment/Process:   Patient joins today by video for individual therapy. This session was conducted as a clinical therapy visit to assess acute safety, monitor ongoing clinical treatment plan, and provide continuity to outpatient treatment in the setting of Covid19 Pandemic.  Patient appropriately engaged in the discussion and was able to follow the  course of the conversation; presenting sad, without indication of psychosis, cooperatively, and future oriented.   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  Pt reflects on hx of being ashamed of being depressed my whole life.  Pt reports fearing outliving her husband because I don't want to face this world without my rock.  Clinician processed session content with pt.    RISK ASSESSMENT: A suicide and violence risk assessment was performed as part of this evaluation.  While future psychiatric events cannot be accurately predicted, the patient does not currently require acute inpatient psychiatric care and does not currently meet [facility] involuntary commitment criteria.      Diagnoses:   Depression, unspecified depression type [F32.A]   Generalized anxiety disorder [F41.1]     [name], LCSW  [facility]   05/02/22    Debroah Loop, LCSW  10:05 AM

## 2022-05-03 DIAGNOSIS — F411 Generalized anxiety disorder: Principal | ICD-10-CM

## 2022-05-03 DIAGNOSIS — F32A Depression, unspecified depression type: Principal | ICD-10-CM

## 2022-05-03 MED ORDER — HYDROXYZINE HCL 25 MG TABLET
ORAL_TABLET | Freq: Every day | ORAL | 2 refills | 30 days | Status: CP | PRN
Start: 2022-05-03 — End: ?

## 2022-05-03 MED ORDER — SERTRALINE 100 MG TABLET
ORAL_TABLET | Freq: Every day | ORAL | 2 refills | 30 days | Status: CP
Start: 2022-05-03 — End: 2022-08-01

## 2022-05-03 NOTE — Unmapped (Signed)
Pharmacy is requesting a 90 days supply refill. I have pended the medication for your approval and have verified the patient's pharmacy choice.

## 2022-05-06 NOTE — Unmapped (Signed)
This was a telehealth service where a resident was involved. I spent 12 minutes reviewing the patient's medical record and discussing the evaluation and treatment recommendations with the resident.  I reviewed and edited the resident's note.  I agree with the resident's findings and plan.    Dory Peru, MD

## 2022-05-09 ENCOUNTER — Ambulatory Visit: Admit: 2022-05-09 | Discharge: 2022-05-10 | Payer: PRIVATE HEALTH INSURANCE

## 2022-05-09 DIAGNOSIS — M79621 Pain in right upper arm: Principal | ICD-10-CM

## 2022-05-09 DIAGNOSIS — N644 Mastodynia: Principal | ICD-10-CM

## 2022-05-14 NOTE — Unmapped (Signed)
The [facility] has made a second and final attempt to reach this patient to refill the following medication: Xifaxan.      We have left voicemails on the following phone numbers: [phone], have sent a MyChart message, have sent a text message to the following phone numbers: [phone], and have sent a Mychart questionnaire..    Dates contacted: 3/6, 3/12  Last scheduled delivery: shipped 2/12    The patient may be at risk of non-compliance with this medication. The patient should call the [facility] at [phone]  Option 4, then Option 2 (all other specialty patients) to refill medication.    [name]   [facility] Specialty Technician

## 2022-05-14 NOTE — Unmapped (Signed)
[facility] Refill Coordination Note    Specialty Medication(s) to be Shipped:   Infectious Disease: Xifaxan    Other medication(s) to be shipped: No additional medications requested for fill at this time     Alexis Burns, DOB: [birth year]  Phone: [phone] (home)       All above HIPAA information was verified with patient.     Was a Nurse, learning disability used for this call? No    Completed refill call assessment today to schedule patient's medication shipment from the [facility] [phone]).  All relevant notes have been reviewed.     Specialty medication(s) and dose(s) confirmed: Regimen is correct and unchanged.   Changes to medications: Edit reports no changes at this time.  Changes to insurance: No  New side effects reported not previously addressed with a pharmacist or physician: None reported  Questions for the pharmacist: No    Confirmed patient received a Conservation officer, historic buildings and a Surveyor, mining with first shipment. The patient will receive a drug information handout for each medication shipped and additional FDA Medication Guides as required.       DISEASE/MEDICATION-SPECIFIC INFORMATION        N/A    SPECIALTY MEDICATION ADHERENCE     Medication Adherence    Patient reported X missed doses in the last month: 0  Specialty Medication: xifaxan 550mg   Patient is on additional specialty medications: No              Were doses missed due to medication being on hold? No    Xifaxan 550 mg: 10 days of medicine on hand        REFERRAL TO PHARMACIST     Referral to the pharmacist: Not needed      [facility]     Shipping address confirmed in Epic.     Patient was notified of new phone menu : No    Delivery Scheduled: Yes, Expected medication delivery date: 05/21/22.     Medication will be delivered via Next Day Courier to the prescription address in Epic [name].    Alexis Burns   [facility] Specialty Technician

## 2022-05-20 DIAGNOSIS — K7460 Unspecified cirrhosis of liver: Principal | ICD-10-CM

## 2022-05-20 DIAGNOSIS — K7682 Hepatic encephalopathy (CMS-HCC): Principal | ICD-10-CM

## 2022-05-20 MED FILL — XIFAXAN 550 MG TABLET: ORAL | 30 days supply | Qty: 60 | Fill #10

## 2022-05-27 ENCOUNTER — Ambulatory Visit: Admit: 2022-05-27 | Discharge: 2022-05-27 | Payer: PRIVATE HEALTH INSURANCE

## 2022-05-27 DIAGNOSIS — K7460 Unspecified cirrhosis of liver: Principal | ICD-10-CM

## 2022-05-27 LAB — PROTIME-INR
INR: 0.98
PROTIME: 11 s (ref 9.9–12.6)

## 2022-05-27 LAB — COMPREHENSIVE METABOLIC PANEL
ALBUMIN: 3.6 g/dL (ref 3.4–5.0)
ALKALINE PHOSPHATASE: 87 U/L (ref 46–116)
ALT (SGPT): 25 U/L (ref 10–49)
ANION GAP: 5 mmol/L (ref 5–14)
AST (SGOT): 31 U/L (ref <=34)
BILIRUBIN TOTAL: 1 mg/dL (ref 0.3–1.2)
BLOOD UREA NITROGEN: 10 mg/dL (ref 9–23)
BUN / CREAT RATIO: 14
CALCIUM: 9.3 mg/dL (ref 8.7–10.4)
CHLORIDE: 109 mmol/L — ABNORMAL HIGH (ref 98–107)
CO2: 28.3 mmol/L (ref 20.0–31.0)
CREATININE: 0.73 mg/dL
EGFR CKD-EPI (2021) FEMALE: >90 mL/min/{1.73_m2} (ref >=60)
GLUCOSE RANDOM: 82 mg/dL (ref 70–179)
POTASSIUM: 3.8 mmol/L (ref 3.4–4.8)
PROTEIN TOTAL: 6.7 g/dL (ref 5.7–8.2)
SODIUM: 142 mmol/L (ref 135–145)

## 2022-05-27 LAB — GAMMA GT: GAMMA GLUTAMYL TRANSFERASE: 80 U/L — ABNORMAL HIGH
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
## 2022-05-27 NOTE — Unmapped (Addendum)
Get moving just a little bit helps with every system in your body.     I'll let Dr. Shana Chute know about the powder      Please follow a 2000mg  sodium restricted diet  Increase your protein intake by eating eggs, chicken, fish, yogurt and/or supplements.       It is OK to take up to 2000mg  of acetaminophen (tylenol), please be cautious of combination products. Do not take ibuprofen, naproxen, aspirin, advil, aleve, motrin, Excedrin or headache powders. Avoid herbal or natural supplements.        Please contact my nurse, Josie Dixon, RN at [phone] If you have questions or concerns or need to get in touch with me.       Please Return to clinic in 3 months you'll be due a repeat Ultra Sound around that time.   Call [phone] to schedule the ultra sound for June.        If you have any questions or concerns please contact us on MyChart or as below:     For appointments, please call [phone].  For scheduling radiology appointments [phone].  For scheduling GI procedures (e.g,. Colonoscopy), please call [phone].  For emergencies after hours call [phone] and ask for the GI medicine fellow on call.          Vallery Sa, NP-C  [facility]  [address] [place], [place] [address]  [place], [place] [address]  Ph: [phone]  Fax: [phone]

## 2022-05-28 LAB — AFP TUMOR MARKER: AFP-TUMOR MARKER: 10 ng/mL — ABNORMAL HIGH (ref <=8)

## 2022-05-30 ENCOUNTER — Telehealth: Admit: 2022-05-30 | Discharge: 2022-05-31 | Payer: PRIVATE HEALTH INSURANCE | Attending: Clinical | Primary: Clinical

## 2022-05-30 DIAGNOSIS — F411 Generalized anxiety disorder: Principal | ICD-10-CM

## 2022-05-30 DIAGNOSIS — F32A Depression, unspecified depression type: Principal | ICD-10-CM

## 2022-05-30 NOTE — Unmapped (Signed)
I spent 40 minutes on video with the patient. I spent an additional 0 minutes on pre- and post-visit activities.  The patient was physically located in [place] or a state in which I am permitted to provide care. The patient understood that s/he may incur co-pays and cost sharing and agreed to the telemedicine visit. The visit was completed via phone and/or video, which was appropriate and reasonable under the circumstances given the patient's presentation at the time.  The patient has been advised of the potential risks and limitations of this mode of treatment (including, but not limited to, the absence of in-person examination) and has agreed to be treated using telemedicine. The patient's/patient's family's questions regarding telemedicine have been answered.   If the phone/video visit was completed in an ambulatory setting, the patient has also been advised to contact their provider???s office for worsening conditions and seek emergency medical treatment and/or call 911 if the patient deems necessary.     [facility]       STEP Patient Psychotherapy    Name: Alexis Burns  Date: 05/30/2022  MRN: [id number]  DOB: 1966/01/24  PCP: Aura Camps, FNP    Service Duration:  40 minutes        Service: Outpatient Therapy- Individual   [x]  Video     Date of Last Encounter:  Visit date not found    Mental Status/Behavioral Observations  Affect:  Mood congruent   Mood:   sad   Thought Process:  Goal directed and Linear   Behavior:   Cooperative and Direct eye contact   Self Harm: none and future oriented     Purpose of contact:    [x]   Continue to address treatment goals  []   Treatment Planning/Treatment progress review []  Discharge Planning     Interventions Provided:     [x]   CBT  []   Interpersonal Process Therapy []  Acceptance & Commitment Therapy (ACT)  []  DBT  []  Motivational Interviewing  []  Behavioral Activation                               []  Psycho-Education  []  Exposure Therapy  []  Trauma-Informed CBT [x]  Person Centered  [x]  Supportive Therapy    Patient Response/Progress:  Identifying Information: Alexis Burns is an [facility] patient since 2019 with a history of major depressive disorder and generalized anxiety disorder, with past hospitalization in 2017 at [facility]. In addition, her history of emotional dysregulation, poor distress tolerance, suspicion of others, and splitting appears consistent with borderline personality traits. She had an exacerbation of depression in May 2021 with her son's mental health as a major stressor, during 2021 she was admitted to the [facility] Crisis Unit for suicidal ideation with a plan to shoot herself.   11/15/21 POC Target Outcomes: reduction in symptoms, including I can't turn my brain off, depression, anxiety, and aggravation/anger, increased independent functioning, specifically not being spoiled and dependent on her husband, reduction in aggresive ideation, employment, improved community integration, including avoiding isolation, and stop focusing on the negative.and stop taking things personally focus on good health.  Treatment/Process:   Patient joins today by video for individual therapy. This session was conducted as a clinical therapy visit to assess acute safety, monitor ongoing clinical treatment plan, and provide continuity to outpatient treatment in the setting of Covid19 Pandemic.  Patient appropriately engaged in the discussion and was able to follow the  course of the conversation; presenting sad, without indication of psychosis, cooperatively, and future oriented.   Clinician facilitated discussion of pt status, concerns, and POC with session content including:  Pt reports feeling like need to vomit again which is normal now but is getting overwhelming and I don't know what to do and no doctor's seem to know what to do I don't know, I'm just down and I'm disgusted I've just got a $#IT attitude and I don't care.  Pt reflects on recent I made some stupid decisions and was pretty nuts eventually recognizing I had missed 3 days of my medication.  Pt reflects on family and difficult interactions.  Clinician processed session content with pt.    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  Pt reflects on hx of being ashamed of being depressed my whole life.  Pt reports fearing outliving her husband because I don't want to face this world without my rock.  Clinician processed session content with pt.    RISK ASSESSMENT: A suicide and violence risk assessment was performed as part of this evaluation.  While future psychiatric events cannot be accurately predicted, the patient does not currently require acute inpatient psychiatric care and does not currently meet [facility] involuntary commitment criteria.      Diagnoses:   Depression, unspecified depression type [F32.A]   Generalized anxiety disorder [F41.1]     Alexis Hoehn, LCSW  Alexis Burns   05/30/22    Alexis Loop, LCSW  10:05 AM

## 2022-05-31 NOTE — Unmapped (Signed)
Forwarding to Dr. Merilynn Finland, also adding PCP.

## 2022-06-08 DIAGNOSIS — R112 Nausea with vomiting, unspecified: Principal | ICD-10-CM

## 2022-06-08 MED ORDER — AMITRIPTYLINE 10 MG TABLET
ORAL_TABLET | Freq: Every evening | ORAL | 5 refills | 0 days
Start: 2022-06-08 — End: ?

## 2022-06-12 MED ORDER — AMITRIPTYLINE 10 MG TABLET
ORAL_TABLET | Freq: Every evening | ORAL | 5 refills | 60 days | Status: CP
Start: 2022-06-12 — End: ?

## 2022-06-15 DIAGNOSIS — R112 Nausea with vomiting, unspecified: Principal | ICD-10-CM

## 2022-06-15 MED ORDER — AMITRIPTYLINE 10 MG TABLET
ORAL_TABLET | Freq: Every evening | ORAL | 5 refills | 60 days
Start: 2022-06-15 — End: ?

## 2022-06-17 NOTE — Unmapped (Signed)
[facility] Refill Coordination Note    Alexis Burns, [place]: [birth year]  Phone: [phone] (home)       All above HIPAA information was verified with patient.         06/17/2022     1:12 PM   Specialty Rx Medication Refill Questionnaire   Which Medications would you like refilled and shipped? XIFAXAN 550 mg Tab. I have about a week's worth of this., amitriptyline 10 MG tablet. Completely out of these., sertraline 100 MG tablet. I have about a week's worth.   Please list all current allergies: No changes   Have you missed any doses in the last 30 days? Yes   If Yes, please choose the number of missed doses below: 0-2   Have you had any changes to your medication(s) since your last refill? No   How many days remaining of each medication do you have at home? See above   Have you experienced any side effects in the last 30 days? No   Please enter the full address (street address, city, state, zip code) where you would like your medication(s) to be delivered to. [address], [address] [place] , [place], [place] [address]   Please specify on which day you would like your medication(s) to arrive. Note: if you need your medication(s) within 3 days, please call the pharmacy to schedule your order at [phone]  06/24/2022   Has your insurance changed since your last refill? No   Would you like a pharmacist to call you to discuss your medication(s)? No   Do you require a signature for your package? (Note: if we are billing Medicare Part B or your order contains a controlled substance, we will require a signature) No         Completed refill call assessment today to schedule patient's medication shipment from the [facility] [phone]).  All relevant notes have been reviewed.       Confirmed patient received a Conservation officer, historic buildings and a Surveyor, mining with first shipment. The patient will receive a drug information handout for each medication shipped and additional FDA Medication Guides as required.         REFERRAL TO PHARMACIST     Referral to the pharmacist: Not needed      [facility]     Shipping address confirmed in Epic.     Delivery Scheduled: Yes, Expected medication delivery date: 4/22.     Medication will be delivered via Same Day Courier to the prescription address in Epic [name].    Alexis Burns   [facility] Specialty Technician

## 2022-06-18 MED ORDER — AMITRIPTYLINE 10 MG TABLET
ORAL_TABLET | Freq: Every evening | ORAL | 5 refills | 60 days | Status: CP
Start: 2022-06-18 — End: ?

## 2022-06-20 ENCOUNTER — Telehealth: Admit: 2022-06-20 | Discharge: 2022-06-21 | Payer: PRIVATE HEALTH INSURANCE | Attending: Clinical | Primary: Clinical

## 2022-06-20 DIAGNOSIS — F411 Generalized anxiety disorder: Principal | ICD-10-CM

## 2022-06-20 DIAGNOSIS — F32A Depression, unspecified depression type: Principal | ICD-10-CM

## 2022-06-20 NOTE — Unmapped (Signed)
I spent 55 minutes on video with the patient. I spent an additional 0 minutes on pre- and post-visit activities.  The patient was physically located in [place] or a state in which I am permitted to provide care. The patient understood that s/he may incur co-pays and cost sharing and agreed to the telemedicine visit. The visit was completed via phone and/or video, which was appropriate and reasonable under the circumstances given the patient's presentation at the time.  The patient has been advised of the potential risks and limitations of this mode of treatment (including, but not limited to, the absence of in-person examination) and has agreed to be treated using telemedicine. The patient's/patient's family's questions regarding telemedicine have been answered.   If the phone/video visit was completed in an ambulatory setting, the patient has also been advised to contact their provider???s office for worsening conditions and seek emergency medical treatment and/or call 911 if the patient deems necessary.     [facility]       STEP Patient Psychotherapy    Name: Alexis Burns  Date: 06/20/2022  MRN: [id number]  DOB: [birth year]  PCP: Aura Camps, FNP    Service Duration:  55 minutes        Service: Outpatient Therapy- Individual   [x]  Video     Date of Last Encounter:  Visit date not found    Mental Status/Behavioral Observations  Affect:  Mood congruent   Mood:   anxious   Thought Process:  Goal directed and Linear   Behavior:   Cooperative and Direct eye contact   Self Harm: none and future oriented     Purpose of contact:    [x]   Continue to address treatment goals  []   Treatment Planning/Treatment progress review []  Discharge Planning     Interventions Provided:     [x]   CBT  []   Interpersonal Process Therapy []  Acceptance & Commitment Therapy (ACT)  []  DBT  []  Motivational Interviewing  []  Behavioral Activation                               []  Psycho-Education  []  Exposure Therapy  []  Trauma-Informed CBT [x]  Person Centered  [x]  Supportive Therapy    Patient Response/Progress:  Identifying Information: Alexis Burns is an [facility] patient since 2019 with a history of major depressive disorder and generalized anxiety disorder, with past hospitalization in 2017 at [facility]. In addition, her history of emotional dysregulation, poor distress tolerance, suspicion of others, and splitting appears consistent with borderline personality traits. She had an exacerbation of depression in May 2021 with her son's mental health as a major stressor, during 2021 she was admitted to the [facility] Crisis Unit for suicidal ideation with a plan to shoot herself.   11/15/21 POC Target Outcomes: reduction in symptoms, including I can't turn my brain off, depression, anxiety, and aggravation/anger, increased independent functioning, specifically not being spoiled and dependent on her husband, reduction in aggresive ideation, employment, improved community integration, including avoiding isolation, and stop focusing on the negative.and stop taking things personally focus on good health.  Treatment/Process:   Patient joins today by video for individual therapy. This session was conducted as a clinical therapy visit to assess acute safety, monitor ongoing clinical treatment plan, and provide continuity to outpatient treatment in the setting of Covid19 Pandemic.  Patient appropriately engaged in the discussion and was able to follow the  course of the conversation; presenting anxious, without indication of psychosis, cooperatively, and future oriented.   Clinician facilitated discussion of pt status, concerns, and POC with session content including:  Pt reports on son Alexis Burns being ill and noting then I got it relating they are recovering.  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  Pt reports on recent outing with her husband, it helped a lot.  Pt reports regular use of gummi's which help with my stomach issues or she will experience withdrawal.  Pt reflects on I realized how spoiled I am noting her husband's efforts/support. Pt discusses being lazy and feels guilty for what my husband does not guilty enough to change.   Pt reports all my many surgeries has spoiled me to expect.  Pt reports on her sisters sharing she sent her sister Alexis Burns a hug with the sister responding with a help.    Pt reflects on scar tissue in the context of emotional/psychological issues.  Clinician processed session content with pt.    05/30/22 Pt reports feeling like need to vomit again which is normal now but is getting overwhelming and I don't know what to do and no doctor's seem to know what to do I don't know, I'm just down and I'm disgusted I've just got a $#IT attitude and I don't care.  Pt reflects on recent I made some stupid decisions and was pretty nuts eventually recognizing I had missed 3 days of my medication.  Pt reflects on family and difficult interactions.  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  Pt reflects on hx of being ashamed of being depressed my whole life.  Pt reports fearing outliving her husband because I don't want to face this world without my rock.    RISK ASSESSMENT: A suicide and violence risk assessment was performed as part of this evaluation.  While future psychiatric events cannot be accurately predicted, the patient does not currently require acute inpatient psychiatric care and does not currently meet [facility] involuntary commitment criteria.      Diagnoses:   Depression, unspecified depression type [F32.A]   Generalized anxiety disorder [F41.1]     [name], LCSW  [facility]   06/20/22    Debroah Loop, LCSW  10:05 AM

## 2022-06-24 MED FILL — XIFAXAN 550 MG TABLET: ORAL | 30 days supply | Qty: 60 | Fill #11

## 2022-06-25 ENCOUNTER — Telehealth: Admit: 2022-06-25 | Discharge: 2022-06-26 | Payer: PRIVATE HEALTH INSURANCE

## 2022-06-25 DIAGNOSIS — F411 Generalized anxiety disorder: Principal | ICD-10-CM

## 2022-06-25 DIAGNOSIS — F32A Depression, unspecified depression type: Principal | ICD-10-CM

## 2022-06-25 DIAGNOSIS — F959 Tic disorder, unspecified: Principal | ICD-10-CM

## 2022-06-25 MED ORDER — HYDROXYZINE HCL 25 MG TABLET
ORAL_TABLET | Freq: Every day | ORAL | 0 refills | 90 days | Status: CP | PRN
Start: 2022-06-25 — End: ?

## 2022-06-25 MED ORDER — SERTRALINE 100 MG TABLET
ORAL_TABLET | Freq: Every day | ORAL | 0 refills | 90 days | Status: CP
Start: 2022-06-25 — End: 2022-09-23

## 2022-06-25 MED ORDER — CLONIDINE HCL 0.1 MG TABLET
ORAL_TABLET | 0 refills | 0 days | Status: CP
Start: 2022-06-25 — End: ?

## 2022-06-25 NOTE — Unmapped (Addendum)
Thank-you for visiting [facility] clinic. The following changes were made to your medications:  - Change clonidine to 0.05 mg (1/2 tablet) daily + 0.2 mg (2 tablets) nightly    Follow-up instructions:  -- Please continue taking your medications as prescribed for your mental health.   -- Do not make changes to your medications, including taking more or less than prescribed, unless under the supervision of your physician. Be aware that some medications may make you feel worse if abruptly stopped  -- Please refrain from using illicit substances, as these can affect your mood and could cause anxiety or other concerning symptoms.   -- Seek further medical care for any increase in symptoms or new symptoms such as thoughts of wanting to hurt yourself or hurt others.     Contact info:  Life-threatening emergencies: Call 911, the 988 suicide and crisis lifeline, or go to the nearest ER for medical or psychiatric attention.           Issues that need urgent attention but are not life threatening: Call the clinic outpatient front desk for assistance. [phone] for our [facility] clinic located at [address] [facility] [phone] for our [facility] clinic.    Non-urgent routine concerns and questions: Send a message through [facility] or call our clinic front desk.    Refill requests: Check with your pharmacy to initiate refill requests.    Regarding appointments:  - If you need to cancel your appointment, we ask that you call your clinic at least 24 hours before your scheduled appointment. [phone] for our [facility] clinic located at [address] [facility]. [phone] for our [facility] STEP clinic.  - If for any reason you arrive 15 minutes later than your scheduled appointment time, you may not be seen and your visit may be rescheduled.  - Please remember that we will not automatically reschedule missed appointments.  - If you miss two (2) appointments without letting us know in advance, you will likely be referred to a provider in your community.  - We will do our best to be on time. Sometimes an emergency will arise that might cause your clinician to be late. We will try to inform you of this when you check in for your appointment. If you wait more than 15 minutes past your appointment time without such notice, please speak with the front desk staff.    In the event of bad weather, the clinic staff will attempt to contact you, should your appointment need to be rescheduled. Additionally, you can call the Patient Weather Line [phone] for system-wide clinic status    For more information and reminders regarding clinic policies (these were provided when you were admitted to the clinic), please ask the front desk.

## 2022-06-25 NOTE — Unmapped (Unsigned)
[facility]  Psychiatry   Established Patient E&M Service - Outpatient       Assessment:  Dorena Lazzara is a 57 y.o. female with a history of MDD and GAD who presents for follow-up evaluation. Jaquavia continues to report ongoing mood stability and attributes this to practicing acceptance and engaging with her values. She has not experienced a recurrence of depressive symptoms or psychiatric instability since stopping ECT in February. She reports experiencing memory problems (eg difficulty managing medications or remembering a show she previously watched). The etiology of this is unclear, though may be related to persistent side effects from ECT or cognitive changes due to vascular issues. MRI from 04/2021 was remarkable for chronic microvascular ischemic disease and mild generalized cerebral volume loss. Will plan to further evaluate and perform a MOCA at her next visit. She also continues to experience abnormal tongue movements that have previously been characterized as tic-related by neurology. Will increase clonidine today to target tics. Follow up in approximately two months. Patient expressed understanding and agrees with plan.    Identifying Information: Ms. Cooperman is an ADTC patient since 2019 with a history of major depressive disorder and generalized anxiety disorder, with past hospitalization in 2017 at [facility] [place]. In addition, her history of emotional dysregulation, poor distress tolerance, suspicion of others, and splitting appears consistent with borderline personality traits. Past medication trials include Prozac (stopped working), risperidone (brief trial), lithium (intolerable side effects), buspirone (unhelpful at 20mg  TID), duloxetine (90mg  well-tolerated but not helpful on its own), aripiprazole (as adjunct for duloxetine, eventually stopped due to tremors), Wellbutrin (anxiety, dry mouth), Seroquel (ineffective), Cytomel (dry mouth, joint pain). Over several years until late 2020, she was fairly stable on duloxetine 90mg  and Abilify 5mg . She used to work in a Engineer, [employer] pre-pandemic in a stressful role, took time off for a leg injury, and then when she went back to work she was fired. This was destabilizing, and it was closely followed by tibial tendon surgery in and then suspected COVID infection. By Feb 2021, mood remained unstable, but Abilify was causing significant shakiness even at tapered doses, and she discontinued it. She had an exacerbation of depression in May 2021 with her son's mental health as a major stressor, and after intolerance of increasing Cymbalta to 120mg , ineffectiveness of Seroquel, intolerance of Wellbutrin, and time-limited benefit of risperidone, in September 2021 she was admitted to the [facility] for suicidal ideation with a plan to shoot herself. They started Cytomel but she did not tolerate it. She noticed some abnormal tongue movements while on Abilify and these worsened after Abilify discontinuation. We considered the possibility of TD, but neurology evaluation labeled the tongue movements as tic-like, and they also diagnosed her with essential tremor. She started to show signs of liver dysfunction on her bloodwork, and in December 2021 we made the switch from duloxetine to Prozac. This was tolerated well and effective for mood, and mood has varied with family stressors. Both insomnia and hypersomnia have been problems for Ms. Mulvaney, and prns have been very helpful, by spring 2022 sleep had improved with more physical activity. Ms. Emmendorfer was eventually restarted on Klonopin in Spring 2023 given severe anxiety/panic that caused her to be unable to leave her house for several weeks. Additionally, she experienced recurrence of depressive symptoms and passive suicidal ideation. Given how well she had done on an SNRI in the past, decision was made to cross taper Prozac to Pristiq. Given continued depressed mood, Kayela was subsequently  referred to interventional psychiatry ECT clinic in April 2023. Due to severity of symptoms, Ms. Bello was directly admitted to [facility] Crisis to start inpatient ECT and she was transitioned from Pristiq to sertraline. Klonopin was discontinued and Hydroxyzine PRN was started to target anxiety. She continued ECT as an outpatient.     Risk Assessment:  A suicide and violence risk assessment was performed at our last visit, and safety assessment remains uncharged: she is not at acute risk of harm to herself but is at chronic risk, and we will continue assessing safety at future evaluations.  Stressors: death of father in 2012/08/30 and mother in 08-31-2015, chronic depressive symptoms, financial stressors, unemployment, global pandemic and likely COVID19 infection, stressful relationships by son (improving).      Plan:  Problem: MDD - Borderline Personality Traits  Status: improved or improving  Interventions:  - Continue sertraline 100 mg daily   - Patient discontinuing maintenance ECT, will resume if her symptoms worsen  - Continue therapy with [name]    Problem: GAD  Status of problem: improved or improving   - Continue hydroxyzine 50 mg daily as needed for anxiety  - Increase clonidine to 0.05 mg daily + 0.2 mg nightly (increased to target tongue movements)    Problem: Nausea and vomiting, r/o somatic symptom disorder  Status of problem: chronic  Interventions:  - Has been evaluated at [facility], currently [facility] by [facility]   - Upper endoscopy on 10/25 remarkable for mild, chronic gastritis w/ erosion in duodenum and stomach   - Consider Remeron in future visits, given antiemetic effect, though pt declined    Problem: Abnormal tongue movements  Status of problem: chronic  Interventions:  - Neurology labeled her tongue movements as tic-related, and suggested therapies to help. We had considered if this was a symptoms of tardive dyskinesia as it worsened after Abilify discontinuation which can happen. It originally diminished with stopping artifical sweeteners, but then came back. Neurology's assessment is reassuring  - Clonidine, as above    Problem: Cognitive changes  Status of problem:  new problem to this provider  Interventions:   - Reports subjective memory complaints, may be related to ECT SE vs vascular issues  - MRI in 04/2021 remarkable for chronic microvascular ischemic disease and mild generalized cerebral volume loss  - Will perform MOCA and evaluate ADLs/IADLs at next visit    Psychotherapy:  No billable psychotherapy service provided.    Patient has been given this writer's contact information as well as the [facility] urgent line number. The patient has been instructed to call 911 for emergencies.    Patient and plan of care were discussed with the Attending MD, Dr. Derrill Kay, who agrees with the above statement and plan.    Jiles Crocker, MD  [facility] PGY2    Subjective:     Psychiatric Chief Concern:  Follow-up psychiatric evaluation for mood    Interval History:  Things are going okay and good. Says she is in a good head space. Have come to the point that I have accepted things I can't change, don't have to be okay with them, but can't let them rule my thoughts. Got to where allow myself to think about things, but not to dwell on them. Used to get upset when found herself thinking about things, but now purposely move on. Cites conflict with one of her sisters as a stressor that she has worked on accepting, hasn't spoken in a while, misses her. Says  ECT saved my life by helping to put her in a frame of mind that allows her to better accept things and move on. Says she is not fixated on things like she used to be. 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 Planning a trip end of May for a [place] the [place] with [place], going to go with her husband. No longer doing ECT, still experiencing memory problems (doesn't remember shows they've watched, husband will help organize pill container). Doesn't think needs to change any meds. Would like 90 day supplies. Son lives with her, he is 20. Other son lives in [place],. Necia is still experiencing nausea/vomiting, has improved a little with new medication from GI (amitryptline), now having episodes of vomiting several times a week. Will take a small bite of a THC gummy that relieves the nausea - 1-2x/week, from a reputable store in [place]. [name] sessions are going well. Still experiencing tongue tics, thinks it is from medication, happens all the time.     Objective:    Mental Status Exam:  Appearance:    Appears stated age, Well nourished and Clean/Neat, sitting in chair in front of screen   Motor:   No abnormal movements or tics noted.    Speech/Language:    Normal rate, volume, tone, fluency and Language intact, well formed   Mood:   Good   Affect:   Full, euthymic, mood congruent   Thought process and Associations:   Logical, linear, clear, coherent, goal directed   Abnormal/psychotic thought content:     Denies SI/HI.   Perceptual disturbances:     Behavior not concerning for response to internal stimuli     Other:   -     The patient reports they are physically located in [place] and is currently: at home. I conducted a audio/video visit. I spent  70m 58s on the video call with the patient. I spent an additional 30 minutes on pre- and post-visit activities on the date of service . [phone] for Physicians and APPs and [phone] for Non- Physician Clinicians)- Only use minutes on the phone to determine level of service.    Video visits [phone]) - Use either level of medical decision making just as an in-person visit OR time which includes both minutes on video and pre/post minutes to determine the level of service.      :[id number]}  The patient reports they are physically located in [place] and is currently: at home. I conducted a audio/video visit. I spent  0s on the video call with the patient. I spent an additional 30 minutes on pre- and post-visit activities on the date of service .

## 2022-06-26 MED FILL — SUMATRIPTAN 50 MG TABLET: ORAL | 25 days supply | Qty: 18 | Fill #1

## 2022-07-01 NOTE — Unmapped (Signed)
I was available. I discussed the case with the resident at length. I reviewed the resident???s note and edited where appropriate. I agree with the resident???s findings and plan.     Bitania Shankland Brandon Tara Rud, MD

## 2022-07-12 ENCOUNTER — Telehealth: Admit: 2022-07-12 | Discharge: 2022-07-13 | Payer: PRIVATE HEALTH INSURANCE | Attending: Clinical | Primary: Clinical

## 2022-07-12 DIAGNOSIS — F32A Depression, unspecified depression type: Principal | ICD-10-CM

## 2022-07-12 DIAGNOSIS — F411 Generalized anxiety disorder: Principal | ICD-10-CM

## 2022-07-12 NOTE — Unmapped (Signed)
I spent 57 minutes on video with the patient. I spent an additional 0 minutes on pre- and post-visit activities.  The patient was physically located in [place] or a state in which I am permitted to provide care. The patient understood that s/he may incur co-pays and cost sharing and agreed to the telemedicine visit. The visit was completed via phone and/or video, which was appropriate and reasonable under the circumstances given the patient's presentation at the time.  The patient has been advised of the potential risks and limitations of this mode of treatment (including, but not limited to, the absence of in-person examination) and has agreed to be treated using telemedicine. The patient's/patient's family's questions regarding telemedicine have been answered.   If the phone/video visit was completed in an ambulatory setting, the patient has also been advised to contact their provider???s office for worsening conditions and seek emergency medical treatment and/or call 911 if the patient deems necessary.     [facility]       STEP Patient Psychotherapy    Name: Alexis Burns  Date: 07/12/2022  MRN: [id number]  DOB: [birth year]  PCP: Alexis Camps, FNP    Service Duration:  57 minutes        Service: Outpatient Therapy- Individual   [x]  Video     Date of Last Encounter:  Visit date not found    Mental Status/Behavioral Observations  Affect:  Mood congruent   Mood:   anxious   Thought Process:  Goal directed and Linear   Behavior:   Cooperative and Direct eye contact   Self Harm: none and future oriented     Purpose of contact:    [x]   Continue to address treatment goals  []   Treatment Planning/Treatment progress review []  Discharge Planning     Interventions Provided:     [x]   CBT  []   Interpersonal Process Therapy []  Acceptance & Commitment Therapy (ACT)  []  DBT  []  Motivational Interviewing  []  Behavioral Activation                               []  Psycho-Education  []  Exposure Therapy  []  Trauma-Informed CBT [x]  Person Centered  [x]  Supportive Therapy    Patient Response/Progress:  Identifying Information: Alexis Burns is an ADTC patient since 2019 with a history of major depressive disorder and generalized anxiety disorder, with past hospitalization in 2017 at [facility]. In addition, her history of emotional dysregulation, poor distress tolerance, suspicion of others, and splitting appears consistent with borderline personality traits. She had an exacerbation of depression in May 2021 with her son's mental health as a major stressor, during 2021 she was admitted to the [facility] Crisis Unit for suicidal ideation with a plan to shoot herself.   11/15/21 POC Target Outcomes: reduction in symptoms, including I can't turn my brain off, depression, anxiety, and aggravation/anger, increased independent functioning, specifically not being spoiled and dependent on her husband, reduction in aggresive ideation, employment, improved community integration, including avoiding isolation, and stop focusing on the negative.and stop taking things personally focus on good health.  Treatment/Process:   Patient joins today by video for individual therapy. This session was conducted as a clinical therapy visit to assess acute safety, monitor ongoing clinical treatment plan, and provide continuity to outpatient treatment in the setting of Covid19 Pandemic.  Patient appropriately engaged in the discussion and was able to follow the  course of the conversation; presenting anxious, without indication of psychosis, cooperatively, and future oriented.   Clinician facilitated discussion of pt status, concerns, and POC with session content including:  Pt reports that I think I've excepted the fact I'm sick but I don't dwell on it  Pt reports having just gotten up because I'm exhausted I never get going until about 12 noting she has been that way for years.  Pt reports a very interesting, pleasant half-week camping at [place] with her son Alexis Burns. The whole time I appreciated the moment I was in noting it had never happened before. Pt reports that we've (her and Shane) grown.  Pt reflects on her sister's/self Aday related interaction, noting being known for my comedic wit I kind of like that role.  Pt reports on an issue between her and her sisters is my feeling about my parents I do not miss them I'm not quite to the point I can forgive for what they did.  Clinician processed session content with pt.    RISK ASSESSMENT: A suicide and violence risk assessment was performed as part of this evaluation.  While future psychiatric events cannot be accurately predicted, the patient does not currently require acute inpatient psychiatric care and does not currently meet [facility] involuntary commitment criteria.      Diagnoses:   Depression, unspecified depression type [F32.A]   Generalized anxiety disorder [F41.1]     [name], LCSW  [facility]   07/12/22    Debroah Loop, LCSW  10:05 AM

## 2022-07-18 DIAGNOSIS — K7460 Unspecified cirrhosis of liver: Principal | ICD-10-CM

## 2022-07-18 DIAGNOSIS — K7682 Hepatic encephalopathy (CMS-HCC): Principal | ICD-10-CM

## 2022-07-18 MED ORDER — XIFAXAN 550 MG TABLET
ORAL_TABLET | Freq: Two times a day (BID) | ORAL | 3 refills | 90 days | Status: CP
Start: 2022-07-18 — End: ?
  Filled 2022-08-05: qty 60, 30d supply, fill #0

## 2022-07-18 NOTE — Unmapped (Signed)
Rx refill request for Xifaxan.      LCV 05/27/2022 with labs.      Will refill this medication

## 2022-07-22 NOTE — Unmapped (Signed)
[facility] Refill Coordination Note    Alexis Burns, [place]: [birth year]  Phone: [phone] (home)       All above HIPAA information was verified with patient.         07/17/2022     6:49 PM   Specialty Rx Medication Refill Questionnaire   Which Medications would you like refilled and shipped? XIFAXAN   Please list all current allergies: No changes since last time.   Have you missed any doses in the last 30 days? No   Have you had any changes to your medication(s) since your last refill? No   How many days remaining of each medication do you have at home? 10   Have you experienced any side effects in the last 30 days? No   Please enter the full address (street address, city, state, zip code) where you would like your medication(s) to be delivered to. [address] [place] [address]   Please specify on which day you would like your medication(s) to arrive. Note: if you need your medication(s) within 3 days, please call the pharmacy to schedule your order at [phone]  08/05/2022   Has your insurance changed since your last refill? No   Would you like a pharmacist to call you to discuss your medication(s)? No   Do you require a signature for your package? (Note: if we are billing Medicare Part B or your order contains a controlled substance, we will require a signature) No         Completed refill call assessment today to schedule patient's medication shipment from the [facility] [phone]).  All relevant notes have been reviewed.       Confirmed patient received a Conservation officer, historic buildings and a Surveyor, mining with first shipment. The patient will receive a drug information handout for each medication shipped and additional FDA Medication Guides as required.         REFERRAL TO PHARMACIST     Referral to the pharmacist: Not needed      [facility]     Shipping address confirmed in Epic.     Delivery Scheduled: Yes, Expected medication delivery date: 08/05/2022. Medication will be delivered via Same Day Courier to the prescription address in Epic [name].    Alexis Burns   [facility] Specialty Technician

## 2022-08-07 ENCOUNTER — Ambulatory Visit: Admit: 2022-08-07 | Discharge: 2022-08-07 | Payer: PRIVATE HEALTH INSURANCE

## 2022-08-07 NOTE — Unmapped (Signed)
COMPLEX CASE MANAGEMENT   Brief Note    Care Coordinator received a voicemail from patient who was returning our call for data collection and possible enrollment with the Complex Case Management program. Care Coordinator will follow up via telephone.       Alexis Burns - High Risk Care Coordinator   [facility]  [address], [address]  [place], [place] [address]  P: [phone] F: [phone]  [email or web address]

## 2022-08-07 NOTE — Unmapped (Signed)
Complex Case Management  SUMMARY NOTE    Attempted to contact pt today at Home and Cell number to introduce Complex Case Management services. Left message to return call.; 1st attempt    Discuss at next visit: Introduction to Complex Case Management      Tolulope Pinkett - High Risk Care Coordinator   [facility]  [address], [address]  [place], [place] [address]  P: [phone] F: [phone]  [email or web address]

## 2022-08-07 NOTE — Unmapped (Signed)
Complex Case Management       Pre-Assessment Note                  08/07/2022    Summary:    [facility] verified correct patient using two identifiers today for enrollment in Complex Case Management. Informed patient of Complex Case Management services. Patient has agreed to participate in the Complex Case Management program. [facility] contact information was provided to patient.     Upcoming Appointment(s):  Patient's next virtual complex case management appointment is at 08/13/22 at 4:00pm with Sherlyn Hay    Completed By: Patient      General Care Management - Patient Level    Assessment completed with: patient[KH1.1]  Patient lives with: spouse, Child/Children (Comment: spouse & son)[KH1.1]  Support system: spouse or partner, children (Comment: spouse & son would provide all support if needed)[KH1.1]  Type of residence: private residence[KH1.1]  DME used at home: none[KH1.1]  Transportation means: family (Comment: her family will drive her she has been advised not to drive GNFAOZH)[YQ6.5]  Does your health interfere with activities of daily living?: sleep, eating, self-care, work, Archivist, household management[KH1.1]  Exercise: yes[KH1.1]  Type of exercise: walking a few times per week when she can[KH1.1]  Follow special diet?: regular[KH1.1]  Interested in seeing dietician?: No (Comment: not at this time)[KH1.1]  Experiencing side effects from current medications: Yes (Comment: altered sleep)[KH1.1]  Interested in seeing pharmacist?: No (Comment: not at this time)[KH1.1]  Difficulty keeping appointments: No (Comment: not at this time)[KH1.1]  Need assistance with community resources?: No (Comment: not at this time)[KH1.1]  Other significant issues impacting care?: Per patient she HQI:ONGEXB Health issues;Headaches;Liver Problems [KH1.1]       Attribution       KH1.1 Steffanie Rainwater 08/07/22 16:39             History Review:           Past Medical History:   Diagnosis Date    ADD (attention deficit disorder) Anxiety Since childhood    Arthritis 10 years    Asthma     cough variant asthma per pt report    Binge eating disorder     Cirrhosis (CMS-HCC)     Cognitive impairment     Depression 04/11/2016    GERD (gastroesophageal reflux disease) recently    Hepatic encephalopathy (CMS-HCC) 2023    Hyperlipidemia 20 years    Not interested in meds    Hypertension     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    Lack of access to transportation     Obesity Since childhood    Seizures (CMS-HCC) ect treatments             Caregiver burden No   Cognitive Impairment Yes   Falls Risk No   Financial difficulty No   Frail Elderly No   Hearing impairment/loss No   Homeless No   Impaired mobility No   Inadequate social/family support No   Ineffective family coping No   Low Literacy No   Nonadherence to medication No   Non-english speaking No   Terminal Illness/Hospice No   Transportation barriers Yes   Visual impairment No             Past Surgical History:   Procedure Laterality Date    BREAST BIOPSY Left     many biopsies    BREAST CYST ASPIRATION Right 2003    abcess    BREAST EXCISIONAL BIOPSY Left  [id number]    CESAREAN SECTION      CHOLECYSTECTOMY      Electro Convulsive/Shock Therapy  04/01/2022    HERNIA REPAIR  2020    HYSTERECTOMY  2001    partial    OOPHORECTOMY      still has one ovary    PR COLONOSCOPY W/BIOPSY SINGLE/MULTIPLE N/A 04/19/2020    Procedure: COLONOSCOPY, FLEXIBLE, PROXIMAL TO SPLENIC FLEXURE; WITH BIOPSY, SINGLE OR MULTIPLE;  Surgeon: Luanne Bras, MD;  Location: [facility];  Service: Gastroenterology    PR REPAIR INCISIONAL HERNIA,STRANG Midline 09/25/2018    Procedure: REPAIR INITIAL INCISIONAL OR VENTRAL HERNIA; INCARCERATED OR STRANGULATED;  Surgeon: Colon Branch, MD;  Location: [facility];  Service: General Surgery    PR SHLDR ARTHROSCOP,SURG,W/ROTAT CUFF REPR Left 09/25/2020    Procedure: ARTHROSCOPY, SHOULDER, SURGICAL; WITH ROTATOR CUFF REPAIR;  Surgeon: Tomasa Rand, MD;  [facility];  Service: Orthopedics    PR UPPER GI ENDOSCOPY,BIOPSY N/A 12/26/2021    Procedure: UGI ENDOSCOPY; WITH BIOPSY, SINGLE OR MULTIPLE;  Surgeon: Kela Millin, MD;  [facility];  Service: Gastroenterology    PR UPPER GI ENDOSCOPY,DIAGNOSIS N/A 04/19/2020    Procedure: UGI ENDO, INCLUDE ESOPHAGUS, STOMACH, & DUODENUM &/OR JEJUNUM; DX W/WO COLLECTION SPECIMN, BY BRUSH OR WASH;  Surgeon: Luanne Bras, MD;  Location: [facility];  Service: Gastroenterology    WISDOM TOOTH EXTRACTION               Family History   Problem Relation Age of Onset    Lymphoma Mother     Diabetes Mother     Hypertension Mother     Skin cancer Mother     Arthritis Mother     Cancer Mother         lymph    Kidney disease Mother     Vision loss Mother         Macular D.    Parkinsonism Father     Hypertension Father     Colon cancer Father 63    Macular degeneration Father     Alcohol abuse Father     Cancer Father 59        colon    Depression Father     Vision loss Father         Macular D.    Parkinsonism Sister     Tremor Sister         essential tremor, hands and voice    Seizures Sister     Migraines Sister     Macular degeneration Sister     Miscarriages / [place] Sister     Vision loss Sister         Macular d    Depression Sister     Skin cancer Sister     Tremor Sister         no diagnosis    Migraines Sister     Macular degeneration Sister     Vision loss Sister         Macular d    Cancer Son         Spinal cancer, leaving pt disabled    Diabetes Son     Mental illness Son     Neuropathy Son     Depression Son     Drug abuse Son     Hypertension Son     Kidney disease Son  Vision loss Son         Diabetes    Mental illness Son     Intellectual Disability Son     Learning disabilities Son     Skin cancer Maternal Grandmother     Stroke Maternal Grandmother     Depression Maternal Grandmother         Skin    Alcohol abuse Paternal Grandmother     Heart disease Paternal Grandfather     Depression Maternal Aunt         Skin    Alcohol abuse Paternal Aunt     Drug abuse Paternal Aunt     Alcohol abuse Paternal Uncle     Drug abuse Paternal Uncle     Anesthesia problems Neg Hx            Counseling given: Not Answered          Counseling given: Not Answered                                   Social History     Substance and Sexual Activity   Drug Use Never    Comment: takes CBD gummies             Social History     Substance and Sexual Activity   Sexual Activity Yes    Partners: Male    Birth control/protection: Surgical    Comment: 1 partner, husband         Social History Review:           Physicist, medical Strain: Medium Risk (08/07/2022)    Overall Financial Resource Strain (CARDIA)     Difficulty of Paying Living Expenses: Somewhat hard              Food Insecurity: No Food Insecurity (08/07/2022)    Hunger Vital Sign     Worried About Running Out of Food in the Last Year: Never true     Ran Out of Food in the Last Year: Never true              Transportation Needs: Unmet Transportation Needs (08/07/2022)    PRAPARE - Therapist, art (Medical): Yes     Lack of Transportation (Non-Medical): Yes              Physical Activity: Inactive (08/07/2022)    Exercise Vital Sign     Days of Exercise per Week: 0 days     Minutes of Exercise per Session: 0 min              Stress: Stress Concern Present (08/07/2022)    [employer] of Occupational Health - Occupational Stress Questionnaire     Feeling of Stress : Very much              Intimate Partner Violence: Not At Risk (08/07/2022)    Humiliation, Afraid, Rape, and Kick questionnaire     Fear of Current or Ex-Partner: No     Emotionally Abused: No     Physically Abused: No     Sexually Abused: No              Alcohol Use: Not At Risk (08/07/2022)    Alcohol Use     How often do you have a drink containing alcohol?: Never     How many drinks containing alcohol do you  have on a typical day when you are drinking?: 1 - 2     How often do you have 5 or more drinks on one occasion?: Never              Tobacco Use: Medium Risk (08/07/2022)    Patient History     Smoking Tobacco Use: Former     Smokeless Tobacco Use: Never     Passive Exposure: Not on file              Depression: Not at risk (12/05/2020)    PHQ-2     PHQ-2 Score: 2          Self-Health:       Oral health - How would you describe the condition of your mouth and teeth including false teeth or dentures?: (!) Poor       Have you had a dental or oral exam in the last year?: (!) No     Reading/Writing:       Are you able to read?: Yes       Are you able to write?: Yes       SDOH assessment complete?: Yes    Driving Concerns:       Do you drive?: No       Mode of Transportation: Family    Community Engagement:       Do you use any community resources?: No               What community resources are you interested in?: Not Interested       Are you currently employed?: No       Are you currently volunteering with any community organization?: No    Home Health:       Any Home Health?: None    Summary of Brief Assessment:            Future Appointments   Date Time Provider Department Center   08/08/2022  1:00 PM Debroah Loop, LCSW [id number]   08/13/2022  4:00 PM Sherlyn Hay, RN [place] [facility]   08/20/2022  2:00 PM Ro, Honor Junes, MD [id number]   08/26/2022  2:50 PM Launa Flight, ANP [facility]   08/27/2022 10:30 AM Patriciaann Clan, MD [facility]       This patient is currently under review for Complex Case Management services. For progress, care plan changes, updates or recent discharges please contact CM.              Deliah Goody - High Risk Care Coordinator   [facility]  [address]  [place], [place] [address]  P: [phone] F: [phone]  [email or web address]

## 2022-08-07 NOTE — Unmapped (Signed)
Received call into the main line of the Complex Case Management Program.   High Risk Care Coordinator  transferred phone call to another team member  Deliah Goody, [facility].

## 2022-08-08 ENCOUNTER — Telehealth: Admit: 2022-08-08 | Discharge: 2022-08-09 | Payer: PRIVATE HEALTH INSURANCE | Attending: Clinical | Primary: Clinical

## 2022-08-08 DIAGNOSIS — F411 Generalized anxiety disorder: Principal | ICD-10-CM

## 2022-08-08 DIAGNOSIS — F32A Depression, unspecified depression type: Principal | ICD-10-CM

## 2022-08-08 NOTE — Unmapped (Signed)
I spent 55 minutes on video with the patient. I spent an additional 0 minutes on pre- and post-visit activities.  The patient was physically located in [place] or a state in which I am permitted to provide care. The patient understood that s/he may incur co-pays and cost sharing and agreed to the telemedicine visit. The visit was completed via phone and/or video, which was appropriate and reasonable under the circumstances given the patient's presentation at the time.  The patient has been advised of the potential risks and limitations of this mode of treatment (including, but not limited to, the absence of in-person examination) and has agreed to be treated using telemedicine. The patient's/patient's family's questions regarding telemedicine have been answered.   If the phone/video visit was completed in an ambulatory setting, the patient has also been advised to contact their provider???s office for worsening conditions and seek emergency medical treatment and/or call 911 if the patient deems necessary.     [facility]       STEP Patient Psychotherapy    Name: Alexis Burns  Date: 08/08/2022  MRN: [id number]  DOB: [birth year]  PCP: Aura Camps, FNP    Service Duration:  55 minutes        Service: Outpatient Therapy- Individual   [x]  Video     Date of Last Encounter:  Visit date not found    Mental Status/Behavioral Observations  Affect:  Mood congruent   Mood:   anxious   Thought Process:  Goal directed and Linear   Behavior:   Cooperative and Direct eye contact   Self Harm: future oriented     Purpose of contact:    [x]   Continue to address treatment goals  []   Treatment Planning/Treatment progress review []  Discharge Planning     Interventions Provided:     [x]   CBT  []   Interpersonal Process Therapy []  Acceptance & Commitment Therapy (ACT)  []  DBT  []  Motivational Interviewing  []  Behavioral Activation                               []  Psycho-Education  []  Exposure Therapy  []  Trauma-Informed CBT [x]  Person Centered  [x]  Supportive Therapy    Patient Response/Progress:  Identifying Information: Alexis Burns is an [facility] patient since 2019 with a history of major depressive disorder and generalized anxiety disorder, with past hospitalization in 2017 at [facility]. In addition, her history of emotional dysregulation, poor distress tolerance, suspicion of others, and splitting appears consistent with borderline personality traits. She had an exacerbation of depression in May 2021 with her son's mental health as a major stressor, during 2021 she was admitted to the [facility] for suicidal ideation with a plan to shoot herself.   11/15/21 POC Target Outcomes: reduction in symptoms, including I can't turn my brain off, depression, anxiety, and aggravation/anger, increased independent functioning, specifically not being spoiled and dependent on her husband, reduction in aggresive ideation, employment, improved community integration, including avoiding isolation, and stop focusing on the negative.and stop taking things personally focus on good health.  Treatment/Process:   Patient joins today by video for individual therapy. This session was conducted as a clinical therapy visit to assess acute safety, monitor ongoing clinical treatment plan, and provide continuity to outpatient treatment in the setting of Covid19 Pandemic.  Patient appropriately engaged in the discussion and was able to follow the course of  the conversation; presenting anxious, without indication of psychosis, cooperatively, and future oriented.   Clinician facilitated discussion of pt status, concerns, and POC with session content including:  Pt asks good news or bad news first, noting spending quality time in the camper with her husband as very positive but reflects on an interaction at [place] with her sister Tora Duck, in which [place] showed her true self. Pt reports I instantly became suicidal by Tuesday I was in pretty bad shape but by Wednesday it changed from being hurt to being mad as hell.   Pt reports that today is Alexis Burns's birthday but will not be celebrating.  Pt reports having had an ultrasound which now stops her denial about this liver disease but is saddened by the likelihood of her not being eligible for a liver transplant due to issue with her ducts.  Pt reflects on being told by her NP that I'm not going to die from the liver disease and the relief it has been for her and her husband.  Pt reports on having gotten and new foster dog Alex.  Pt reports on her husband having told her that he was proud of pt because she pulled herself out of what would have historically bottomed out.  Pt reports on [employer] vaping and the positive impact it has on her ability to relax.  Clinician processed session content with pt commenting    07/12/22 Pt reports that I think I've accepted the fact I'm sick but I don't dwell on it  Pt reports on an issue between her and her sisters is my feeling about my parents I do not miss them I'm not quite to the point I can forgive for what they did.    RISK ASSESSMENT: A suicide and violence risk assessment was performed as part of this evaluation.  While future psychiatric events cannot be accurately predicted, the patient does not currently require acute inpatient psychiatric care and does not currently meet [facility] involuntary commitment criteria.      Diagnoses:   Depression, unspecified depression type [F32.A]   Generalized anxiety disorder [F41.1]     [name], LCSW  [facility]   08/08/22    Debroah Loop, LCSW  10:05 AM
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
## 2022-08-09 NOTE — Unmapped (Addendum)
Alexis Burns is a 57 y.o. female with Class 3 obesity due to weight gain associated with unhealthy lifestyle, including physical inactivity and frequent consumption of ultraprocessed foods and products with high amounts of added sugars, including Mt. Dew. Additionally, Pt gained weight with smoking cessation and post-partum weight retention.     Comorbidities: prediabetes, hyperlipidemia, hypertension, cirrhosis, depression.   Barriers: BED, depression/anxiety, ankle and back pain.    Weight Summary:  Starting weight/BMI/WC/[place]: 245 lbs, BMI 40.77, WC 44,  14 (04/05/2021)  Target weight/goal: No goal weight. Patient wants general improvement in health and appearance.  3 Today's weight/BMI:  , BMI 38  (08/20/2022)  4 % Body weight loss: N/A  5 Today's   (08/20/2022)    Referred by: Self, Referred    GOALS:     I have reviewed the patient's medical history, lifestyle history and labs/tests.   My recommendations include the following:    Lifestyle Pattern Summary: See GOALS above.     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 Wishes to try medical management but will consider in the future. Pt has had ~10 surgeries including C-section x2, cholecystectomy, hysterectomy & other musculoskeletal surgeries. Pt will keep all options open, especially due to multiple comorbidities including cirrhosis. (Updated 04/05/2021)    Medical conditions:  Glaucoma: no  Seizures: no  Medullary thyroid cancer (personal or family hx): no  Multiple Endocrine Neoplasia: no  Palpitations/Tachycardia: no  Chest Pain: no past history of MI.   Headaches/Migraines: YES  Nephrolithiasis: YES  H/o pancreatitis: YES -- gallstone pancreatitis 35 yrs ago  GERD: no     Prior Surgeries:  Cholecystectomy: YES  Hysterectomy: YES    Birth Control Methods: N/A--post meno/hysterectomy

## 2022-08-09 NOTE — Unmapped (Signed)
Hx of preDM. Reviewed relevant medications and labs. Pt is following our [facility]. See Obesity tab for medication changes.

## 2022-08-13 ENCOUNTER — Ambulatory Visit: Admit: 2022-08-13 | Discharge: 2022-08-14

## 2022-08-13 NOTE — Unmapped (Signed)
Complex Case Management      Full Assessment Note                 08/13/2022     Summary:   Case Manager verified correct patient using two identifiers today for enrollment in Complex Case Management. Informed patient of Complex Case Management services. Patient has agreed to participate in the Complex Case Management program. Case Manager contact information was provided to patient. Case manager has reviewed [facility] Pre-Assessment notes and addressed needs identified in current assessment note.    Completed By: Patient    General Care Management - Patient Level    Assessment completed with: patient[KH1.1]  Patient lives with: spouse, Child/Children (Comment: spouse & son)[KH1.1]  Support system: spouse or partner, children (Comment: spouse & son would provide all support if needed)[KH1.1]  Type of residence: private residence[KH1.1]  DME used at home: none[KH1.1]  Transportation means: family (Comment: her family will drive her she has been advised not to drive [place])[AV4.0]  Does your health interfere with activities of daily living?: sleep, eating, self-care, work, Archivist, household management[KH1.1]  Exercise: yes[KH1.1]  Type of exercise: walking a few times per week when she can[KH1.1]  Follow special diet?: regular[KH1.1]  Interested in seeing dietician?: No (Comment: not at this time)[KH1.1]  Experiencing side effects from current medications: Yes (Comment: altered sleep)[KH1.1]  Interested in seeing pharmacist?: No (Comment: not at this time)[KH1.1]  Difficulty keeping appointments: No (Comment: not at this time)[KH1.1]  Need assistance with community resources?: No (Comment: not at this time)[KH1.1]  Other significant issues impacting care?: Per patient she JWJ:XBJYNW Health issues;Headaches;Liver Problems [KH1.1]       Attribution       KH1.1 Steffanie Rainwater 08/07/22 16:39            Primary Health Concern:       What is your/your child's primary health concern?: liver    Assessment: Completed By: Patient    Self Health:       How would you describe you or your child's current physical health?: (!) Fair       How would you describe you or your child's current mental health?: (!) Fair       Thinking of your recent appointments or visits, are there any instructions that you do not understand or may have difficulty following? : No               What concerns would cause you to go to the ED (Emergency Department) rather than your PCP/other provider? : mental health and kidney stones         Chronic Pain:       Do you/ your child have chronic pain?: (!) Yes (Pain is located in joints. Pain is 4 out of 10. Patient has chronic nausea)    Dietary Needs:       Do you/your child have any special dietary needs or restrictions?: No    Health Summary:       Summary of Self-Health, chronic pain, dietary needs, and medication : Patient's primary health concern is her liver. Patient reports her physical/mental health is fair. Patient talks with therapy once every 2 weeks. Patient has chronic pain that is located in her joints. Pain is 4 out of 10. Patient has chronic nausea. Patient has had to go to the ED for mental health and kidney stones. Patient is not on any special diet.    Self-Efficacy Assessment:       How confident  are you that you can keep the fatigue caused by your disease from interfering with the things you want to do?: 2       How confident are you that you can keep the physical discomfort or pain of your disease from interfering with the things you want to do?: 5       How confident are you that you can keep the emotional distress caused by your disease from interfering with the things you want to do?: 1       How confident are you that you can keep any other symptoms or health problems you have from interfering with the things you want to do?: 1       How confident are you that you can do the different tasks and activities needed to manage your health condition so as to reduce you need to see a doctor?: 5       How confident are you that you can do things other than just taking medication to reduce how much you illness affects your everyday life?: 9       SCORE: 3.83    ADL/IADL:       Dressing - Ability to perform: Independent       Grooming - Ability to perform: Independent       Feeding - Ability to perform: Independent       Bathing - Ability to perform: Independent       Toileting - Ability to perform: Independent       Transfering- Ability to perform: Independent       Continence: Chief Strategy Officer - Do you have a problem with any of the following:: [employer], Medication Management, Food Prep, Educational psychologist       Summary of ADL/IADL: Patient is independent of ADL's. Patient needs assistance with IADL's except housekeeping and laundry. Patient denies incontinence.    Vision:       Patient's Vision Adequate to Safely Complete Daily Activities: (!) No (vision issues- clarity comes and goes after liver dx. Cloudy)    Learning and Disability:       History of attendance at a Special Education program or setting?: No       Intellectual Disability?: No       Learning Disability?: No    ACS Disability Status:       Are you deaf or do you have serious difficulty hearing?: No       Do you wear hearing aids?: No       Do you use any assistive devices to communicate?: No       Summary of Vision, Driving, Reading/Writing, and Disability Status : Patient has vision issues r/t liver dx and reports cloudy vision at times. Patient denies hearing issues. Patient denies learning disability.    Support:       Do you have a caregiver/social support?: Yes       What types of support does your caregiver provide for you?: Emotional and/or Social Support, Optician, dispensing of Caregiver/Social Support: Patient's son and husband help with transportation and provide emotional support.    Patient/Family Medical Concerns:       Oriented to person, place, time, situation: Oriented x 4       Do you have concerns about your memory?: (!) Yes  Does anyone in your family have concerns about your memory?: (!) Yes    CM Assessment:       Summary of Memory and Cognitive Status: Patient is A&Ox4 and reports memory issues r/t ECT tx.    Advance Care Planning Review:       ACP Reviewed: Yes (Has in place)    Life Planning:       Do risk factors indicate an imminent need for Long Term Services and Support?: No       Does the patient have any upcoming life transitions?: No               Summary of Advanced Care and Life Planning: CM provided ACP education and patient has this in place. Patient denies any upcoming life transition or imminent long term service needs.    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 Patient has a fair understanding of her insurance benefits.     [place] - Child Welfare:                                                    Barriers to Care:       What are the patient's barriers? (Select all that apply): (!) Psychiatric Diagnosis, Transportation, Health Literacy       Summary of barriers to care, SDOH, and other identified patient care needs (including community engagement, justice involvement, home health, and benefits): Patient has no hx with law enforcment. Patient has emergency back-up plan and access to emergency back up items. CM shared care plan with patient. Patient's barriers are psychiatric diagnosis, transportation and health literacy.    CM Summary:       Patient needs/concerns addressed today:: CM provided ACP education, PHQ-2 and PHQ-9 assessment       Care Plan items covered today:: CM provided ACP education, PHQ-2 and PHQ-9 assessment       Care plan items planned for next conversation:: medication review    History Review:         Past Medical History:   Diagnosis Date    ADD (attention deficit disorder)     Anxiety Since childhood    Arthritis 10 years    Asthma     cough variant asthma per pt report    Binge eating disorder     Cirrhosis (CMS-HCC)     Cognitive impairment  Depression 04/11/2016    GERD (gastroesophageal reflux disease) recently    Hepatic encephalopathy (CMS-HCC) 2023    Hyperlipidemia 20 years    Not interested in meds    Hypertension     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    Lack of access to transportation     Obesity Since childhood    Seizures (CMS-HCC) ect treatments             Caregiver burden No   Cognitive Impairment Yes   Falls Risk No   Financial difficulty No   Frail Elderly No   Hearing impairment/loss No   Homeless No   Impaired mobility No   Inadequate social/family support No   Ineffective family coping No   Low Literacy No   Nonadherence to medication No   Non-english speaking No   Terminal Illness/Hospice No   Transportation barriers Yes   Visual impairment No             Past Surgical History:   Procedure Laterality Date    BREAST BIOPSY Left     many biopsies    BREAST CYST ASPIRATION Right 2003    abcess    BREAST EXCISIONAL BIOPSY Left 2014ish    CESAREAN SECTION      CHOLECYSTECTOMY      Electro Convulsive/Shock Therapy  04/01/2022    HERNIA REPAIR  2020    HYSTERECTOMY  2001    partial    OOPHORECTOMY      still has one ovary    PR COLONOSCOPY W/BIOPSY SINGLE/MULTIPLE N/A 04/19/2020    Procedure: COLONOSCOPY, FLEXIBLE, PROXIMAL TO SPLENIC FLEXURE; WITH BIOPSY, SINGLE OR MULTIPLE;  Surgeon: Luanne Bras, MD;  Location: [facility];  Service: Gastroenterology    PR REPAIR INCISIONAL HERNIA,STRANG Midline 09/25/2018    Procedure: REPAIR INITIAL INCISIONAL OR VENTRAL HERNIA; INCARCERATED OR STRANGULATED;  Surgeon: Colon Branch, MD;  [facility];  Service: General Surgery    PR SHLDR ARTHROSCOP,SURG,W/ROTAT CUFF REPR Left 09/25/2020    Procedure: ARTHROSCOPY, SHOULDER, SURGICAL; WITH ROTATOR CUFF REPAIR;  Surgeon: Tomasa Rand, MD;  Location[facility];  Service: Orthopedics    PR UPPER GI ENDOSCOPY,BIOPSY N/A 12/26/2021    Procedure: UGI ENDOSCOPY; WITH BIOPSY, SINGLE OR MULTIPLE;  Surgeon: Kela Millin, MD;  [facility];  Service: Gastroenterology    PR UPPER GI ENDOSCOPY,DIAGNOSIS N/A 04/19/2020    Procedure: UGI ENDO, INCLUDE ESOPHAGUS, STOMACH, & DUODENUM &/OR JEJUNUM; DX W/WO COLLECTION SPECIMN, BY BRUSH OR WASH;  Surgeon: Luanne Bras, MD;  Location: [facility];  Service: Gastroenterology    WISDOM TOOTH EXTRACTION               Family History   Problem Relation Age of Onset    Lymphoma Mother     Diabetes Mother     Hypertension Mother     Skin cancer Mother     Arthritis Mother     Cancer Mother         lymph    Kidney disease Mother     Vision loss Mother         Macular D.    Parkinsonism Father     Hypertension Father     Colon cancer Father 12    Macular degeneration Father     Alcohol abuse Father     Cancer Father 70  colon    Depression Father     Vision loss Father         Macular D.    Parkinsonism Sister     Tremor Sister         essential tremor, hands and voice    Seizures Sister     Migraines Sister     Macular degeneration Sister     Miscarriages / [place] Sister     Vision loss Sister         Macular d    Depression Sister     Skin cancer Sister     Tremor Sister         no diagnosis    Migraines Sister     Macular degeneration Sister     Vision loss Sister         Macular d    Cancer Son         Spinal cancer, leaving pt disabled    Diabetes Son     Mental illness Son     Neuropathy Son     Depression Son     Drug abuse Son Hypertension Son     Kidney disease Son     Vision loss Son         Diabetes    Mental illness Son     Intellectual Disability Son     Learning disabilities Son     Skin cancer Maternal Grandmother     Stroke Maternal Grandmother     Depression Maternal Grandmother         Skin    Alcohol abuse Paternal Grandmother     Heart disease Paternal Grandfather     Depression Maternal Aunt         Skin    Alcohol abuse Paternal Aunt     Drug abuse Paternal Aunt     Alcohol abuse Paternal Uncle     Drug abuse Paternal Uncle     Anesthesia problems Neg Hx            Counseling given: Not Answered          Counseling given: Not Answered                                   Social History     Substance and Sexual Activity   Drug Use Never    Comment: takes CBD gummies             Social History     Substance and Sexual Activity   Sexual Activity Yes    Partners: Male    Birth control/protection: Surgical    Comment: 1 partner, husband       Medications:         Outpatient Encounter Medications as of 08/13/2022   Medication Sig Dispense Refill    amitriptyline (ELAVIL) 10 MG tablet Take 1 tablet (10 mg total) by mouth nightly. 60 tablet 5    cholestyramine-aspartame (CHOLESTYRAMINE LIGHT) 4 gram PwPk Take 0.5 packets by mouth two (2) times a day. 90 packet 1    cloNIDine HCL (CATAPRES) 0.1 MG tablet Take 0.05 mg (one-half tablet) in the morning + 0.2 mg (two tablets) nightly. Monitor for dizziness with standing. 225 tablet 0    dicyclomine (BENTYL) 20 mg tablet Take 1 tablet (20 mg total) by mouth four (4) times a day as needed. 120 tablet 5  famotidine (PEPCID) 20 MG tablet Take 1 tablet (20 mg total) by mouth two (2) times a day for 10 days. 20 tablet 0    hydrocortisone (ANUSOL-HC) 2.5 % rectal cream Insert into the rectum two (2) times a day. 30 g 0    hydrOXYzine (ATARAX) 25 MG tablet Take 2 tablets (50 mg total) by mouth daily as needed for anxiety. 180 tablet 0    sertraline (ZOLOFT) 100 MG tablet Take 1 tablet (100 mg total) by mouth daily. 90 tablet 0    sumatriptan (IMITREX) 50 MG tablet Take 1 tablet (50 mg total) by mouth every two (2) hours as needed for migraine. 60 tablet 0    XIFAXAN 550 mg Tab Take 1 tablet (550 mg total) by mouth Two (2) times a day. 180 tablet 3     No facility-administered encounter medications on file as of 08/13/2022.       Social History Review:           Physicist, medical Strain: Medium Risk (08/07/2022)    Overall Financial Resource Strain (CARDIA)     Difficulty of Paying Living Expenses: Somewhat hard              Food Insecurity: No Food Insecurity (08/07/2022)    Hunger Vital Sign     Worried About Running Out of Food in the Last Year: Never true     Ran Out of Food in the Last Year: Never true              Transportation Needs: Unmet Transportation Needs (08/07/2022)    PRAPARE - Therapist, art (Medical): Yes     Lack of Transportation (Non-Medical): Yes              Physical Activity: Inactive (08/07/2022)    Exercise Vital Sign     Days of Exercise per Week: 0 days     Minutes of Exercise per Session: 0 min              Stress: Stress Concern Present (08/07/2022)    [employer] of Occupational Health - Occupational Stress Questionnaire     Feeling of Stress : Very much              Intimate Partner Violence: Not At Risk (08/07/2022)    Humiliation, Afraid, Rape, and Kick questionnaire     Fear of Current or Ex-Partner: No     Emotionally Abused: No     Physically Abused: No     Sexually Abused: No              Alcohol Use: Not At Risk (08/07/2022)    Alcohol Use     How often do you have a drink containing alcohol?: Never     How many drinks containing alcohol do you have on a typical day when you are drinking?: 1 - 2     How often do you have 5 or more drinks on one occasion?: Never              Tobacco Use: Medium Risk (08/07/2022)    Patient History     Smoking Tobacco Use: Former     Smokeless Tobacco Use: Never     Passive Exposure: Not on file Depression: At risk (08/13/2022)    PHQ-2     PHQ-2 Score: 5         PHQ-2 Total Score : 5   PHQ-9 TOTAL SCORE: 19  Patient's Rights and Responsibilities:  Patient Rights and Responsibilities reviewed with enclosed Welcome Letter via Care Plan: Yes    This patient is currently receiving Complex Case Management services.      Primary Case Manager: Sherlyn Hay, RN [phone]  Please contact CM for care plan changes, updates or recent discharges.    High Risk Drivers: Complex Diagnosis  Primary Disease Process: HTN Chronic Pain joints, cirrhosis, Depression  Current Residence: Home with spouse  Primary Medical Home: Aura Camps, FNP???s office  [phone]  Referred to Strategic Scheduling for assistance establishing a PCP: no   Current services: Psychiatry  Patient's Primary Concern is/goals are: Improve chronic condition management  Barriers: Transportation, Psychiatric Diagnosis, and Health Literacy  Strengths: Self-advocacy, Family connection, and Spiritual/faith connection  Supports: Spouse/Partner and Adult Children  Interventions provided: Contact Information Provided, Introduction to ICM, Advanced Care Planning Education, and Supportive Listening   Follow up with ICM Team Member: 2 weeks            Future Appointments   Date Time Provider Department Center   08/20/2022  2:00 PM Ro, Honor Junes, MD [id number]   08/26/2022  2:50 PM Launa Flight, ANP [id number]   08/27/2022 10:30 AM Patriciaann Clan, MD [facility]   09/20/2022  2:00 PM Debroah Loop, LCSW [id number]   10/03/2022  1:00 PM Debroah Loop, LCSW [id number]

## 2022-08-13 NOTE — Unmapped (Signed)
Complex Case Management  SUMMARY NOTE    Attempted to contact pt today at Cell number to complete initial assessment. No answer, unable to leave message; 1st attempt    Discuss at next visit: Complete Initial Assessment

## 2022-08-26 ENCOUNTER — Ambulatory Visit: Admit: 2022-08-26 | Discharge: 2022-08-27 | Payer: PRIVATE HEALTH INSURANCE

## 2022-08-26 DIAGNOSIS — K7460 Unspecified cirrhosis of liver: Principal | ICD-10-CM

## 2022-08-26 LAB — CBC W/ AUTO DIFF
BASOPHILS ABSOLUTE COUNT: 0 10*9/L (ref 0.0–0.1)
BASOPHILS RELATIVE PERCENT: 0.9 %
EOSINOPHILS ABSOLUTE COUNT: 0.2 10*9/L (ref 0.0–0.5)
EOSINOPHILS RELATIVE PERCENT: 3.7 %
HEMATOCRIT: 41.7 % (ref 34.0–44.0)
HEMOGLOBIN: 13.8 g/dL (ref 11.3–14.9)
LYMPHOCYTES ABSOLUTE COUNT: 2 10*9/L (ref 1.1–3.6)
LYMPHOCYTES RELATIVE PERCENT: 35.8 %
MEAN CORPUSCULAR HEMOGLOBIN CONC: 33.2 g/dL (ref 32.0–36.0)
MEAN CORPUSCULAR HEMOGLOBIN: 28.1 pg (ref 25.9–32.4)
MEAN CORPUSCULAR VOLUME: 84.5 fL (ref 77.6–95.7)
MEAN PLATELET VOLUME: 7.8 fL (ref 6.8–10.7)
MONOCYTES ABSOLUTE COUNT: 0.5 10*9/L (ref 0.3–0.8)
MONOCYTES RELATIVE PERCENT: 8.3 %
NEUTROPHILS ABSOLUTE COUNT: 2.8 10*9/L (ref 1.8–7.8)
NEUTROPHILS RELATIVE PERCENT: 51.3 %
PLATELET COUNT: 69 10*9/L — ABNORMAL LOW (ref 150–450)
RED BLOOD CELL COUNT: 4.93 10*12/L (ref 3.95–5.13)
RED CELL DISTRIBUTION WIDTH: 15.1 % (ref 12.2–15.2)
WBC ADJUSTED: 5.5 10*9/L (ref 3.6–11.2)

## 2022-08-26 LAB — COMPREHENSIVE METABOLIC PANEL
ALBUMIN: 3.5 g/dL (ref 3.4–5.0)
ALKALINE PHOSPHATASE: 90 U/L (ref 46–116)
ALT (SGPT): 26 U/L (ref 10–49)
ANION GAP: 6 mmol/L (ref 5–14)
AST (SGOT): 31 U/L (ref <=34)
BILIRUBIN TOTAL: 0.9 mg/dL (ref 0.3–1.2)
BLOOD UREA NITROGEN: 12 mg/dL (ref 9–23)
BUN / CREAT RATIO: 16
CALCIUM: 9.5 mg/dL (ref 8.7–10.4)
CHLORIDE: 112 mmol/L — ABNORMAL HIGH (ref 98–107)
CO2: 24.8 mmol/L (ref 20.0–31.0)
CREATININE: 0.74 mg/dL
EGFR CKD-EPI (2021) FEMALE: >90 mL/min/{1.73_m2} (ref >=60)
GLUCOSE RANDOM: 79 mg/dL (ref 70–179)
POTASSIUM: 4 mmol/L (ref 3.4–4.8)
PROTEIN TOTAL: 6.6 g/dL (ref 5.7–8.2)
SODIUM: 143 mmol/L (ref 135–145)

## 2022-08-26 LAB — PROTIME-INR
INR: 1.01
PROTIME: 11.3 s (ref 9.9–12.6)
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
## 2022-08-26 NOTE — Unmapped (Addendum)
Stop the Xifaxin for 2 weeks to see if it is the cause of your symptoms.       If you become confuses you'll have to take Lactulose. Until you clear.      I'm fine with ozempic       Focus on Protein intake 80-100 Grams   30 grams of fiber per day.     Use Plain [place] Yogurt instead of sour cream      Pudding Yogurt  Big Carton of Yogurt  Sugar free jello pudding  1/2 milk mix all together, enjoy, make 5 servings.

## 2022-08-27 ENCOUNTER — Telehealth: Admit: 2022-08-27 | Discharge: 2022-08-28 | Payer: PRIVATE HEALTH INSURANCE

## 2022-08-27 DIAGNOSIS — F32A Depression, unspecified depression type: Principal | ICD-10-CM

## 2022-08-27 DIAGNOSIS — F959 Tic disorder, unspecified: Principal | ICD-10-CM

## 2022-08-27 DIAGNOSIS — F411 Generalized anxiety disorder: Principal | ICD-10-CM

## 2022-08-27 MED ORDER — HYDROXYZINE HCL 25 MG TABLET
ORAL_TABLET | Freq: Every day | ORAL | 0 refills | 90 days | Status: CP | PRN
Start: 2022-08-27 — End: ?

## 2022-08-27 MED ORDER — CLONIDINE HCL 0.1 MG TABLET
ORAL_TABLET | Freq: Every evening | ORAL | 0 refills | 90 days | Status: CP
Start: 2022-08-27 — End: ?

## 2022-08-27 MED ORDER — SERTRALINE 100 MG TABLET
ORAL_TABLET | Freq: Every day | ORAL | 0 refills | 90 days | Status: CP
Start: 2022-08-27 — End: 2022-11-25

## 2022-08-27 NOTE — Unmapped (Unsigned)
[facility]  Psychiatry   Established Patient E&M Service - Outpatient       Assessment:  Alexis Burns is a 57 y.o. female with a history of MDD and GAD who presents for follow-up evaluation. Ariely continues to report mood stability, though does report experiencing frustration and a transient increase in passive SI in the context of conflict with her sisters one month ago. She currently denies suicidal ideation and there are no acute safety concerns today. 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 She reports subjective cognitive concerns, including difficulties with memory and language (forgetting names/appointment dates). She is independent with ADLs and iADLs. Virtual MOCA today was within normal range, 23/25 and will plan to monitor in future visits. The etiology of her symptoms are unclear, though may be related to persistent side effects of ECT or cognitive changes due to vascular issues. Reports morning clonidine was not helpful for abnormal tongue movements and only taking the medication at night time. No changes today. Follow up in six weeks. Resident transition discussed. Patient expressed understanding and agrees with plan.     Identifying Information: Alexis Burns is an ADTC patient since 2019 with a history of major depressive disorder and generalized anxiety disorder, with past hospitalization in 2017 at [facility]. In addition, her history of emotional dysregulation, poor distress tolerance, suspicion of others, and splitting appears consistent with borderline personality traits. Past medication trials include Prozac (stopped working), risperidone (brief trial), lithium (intolerable side effects), buspirone (unhelpful at 20mg  TID), duloxetine (90mg  well-tolerated but not helpful on its own), aripiprazole (as adjunct for duloxetine, eventually stopped due to tremors), Wellbutrin (anxiety, dry mouth), Seroquel (ineffective), Cytomel (dry mouth, joint pain). Over several years until late 2020, she was fairly stable on duloxetine 90mg  and Abilify 5mg . She used to work in a [employer], [employer] pre-pandemic in a stressful role, took time off for a leg injury, and then when she went back to work she was fired. This was destabilizing, and it was closely followed by tibial tendon surgery in and then suspected COVID infection. By Feb 2021, mood remained unstable, but Abilify was causing significant shakiness even at tapered doses, and she discontinued it. She had an exacerbation of depression in May 2021 with her son's mental health as a major stressor, and after intolerance of increasing Cymbalta to 120mg , ineffectiveness of Seroquel, intolerance of Wellbutrin, and time-limited benefit of risperidone, in September 2021 she was admitted to the [facility] for suicidal ideation with a plan to shoot herself. They started Cytomel but she did not tolerate it. She noticed some abnormal tongue movements while on Abilify and these worsened after Abilify discontinuation. We considered the possibility of TD, but neurology evaluation labeled the tongue movements as tic-like, and they also diagnosed her with essential tremor. She started to show signs of liver dysfunction on her bloodwork, and in December 2021 we made the switch from duloxetine to Prozac. This was tolerated well and effective for mood, and mood has varied with family stressors. Both insomnia and hypersomnia have been problems for Alexis Burns, and prns have been very helpful, by spring 2022 sleep had improved with more physical activity. Alexis Burns was eventually restarted on Klonopin in Spring 2023 given severe anxiety/panic that caused her to be unable to leave her house for several weeks. Additionally, she experienced recurrence of depressive symptoms and passive suicidal ideation. Given how well she had done on an SNRI in the past,  decision was made to cross taper Prozac to Pristiq. Given continued depressed mood, Tuwanda was subsequently referred to interventional psychiatry ECT clinic in April 2023. Due to severity of symptoms, Alexis Burns was directly admitted to [facility] to start inpatient ECT and she was transitioned from Pristiq to sertraline. Klonopin was discontinued and Hydroxyzine PRN was started to target anxiety. She continued ECT as an outpatient.     Risk Assessment:  A suicide and violence risk assessment was performed at our last visit, and safety assessment remains uncharged: she is not at acute risk of harm to herself but is at chronic risk, and we will continue assessing safety at future evaluations.  Stressors: death of father in September 11, 2012 and mother in 2015/09/12, chronic depressive symptoms, financial stressors, unemployment, global pandemic and likely COVID19 infection, stressful relationships by son (improving).      Plan:  Problem: MDD - Borderline Personality Traits  Status: improved or improving  Interventions:  - Continue sertraline 100 mg daily   - Patient discontinued maintenance ECT, will resume if her symptoms worsen  - Continue therapy with [name]    Problem: GAD  Status of problem: improved or improving   - Continue hydroxyzine 50 mg daily as needed for anxiety  - Continue clonidine 0.2 mg nightly (AM dose not helpful for tongue movements)    Problem: Nausea and vomiting, r/o somatic symptom disorder  Status of problem: chronic  Interventions:  - Has been evaluated at [facility], currently followed by [facility]   - Upper endoscopy on 10/25 remarkable for mild, chronic gastritis w/ erosion in duodenum and stomach   - Consider Remeron in future visits, given antiemetic effect, though pt declined    Problem: Abnormal tongue movements  Status of problem: chronic  Interventions:  - Neurology labeled her tongue movements as tic-related, and suggested therapies to help. We had considered if this was a symptoms of tardive dyskinesia as it worsened after Abilify discontinuation which can happen. It originally diminished with stopping artifical sweeteners, but then came back. Neurology's assessment is reassuring  - Clonidine, as above    Problem: Cognitive changes  Status of problem:  chronic and stable  Interventions:   - Reports subjective memory complaints, may be related to ECT SE vs vascular issues  - MRI in 04/2021 remarkable for chronic microvascular ischemic disease and mild generalized cerebral volume loss  - Virtual MOCA 23/25 in 08/2022, independent with ADLs/iADLs. CTM in future visits.     Psychotherapy:  No billable psychotherapy service provided.    Patient has been given this writer's contact information as well as the [facility] Psychiatry urgent line number. The patient has been instructed to call 911 for emergencies.    Patient and plan of care were discussed with the Attending MD, Dr. Derrill Kay, who agrees with the above statement and plan.    Jiles Crocker, MD  [facility] PGY2    Subjective:     Psychiatric Chief Concern:  Follow-up psychiatric evaluation for mood    Interval History:  Doing okay. Doing as good as can be expected. Have had some conflict with relatives (sisters), says they don't agree with her ECT treatments, and they made invalidating comments about her mental health. Have decided she doesn't need them in her life if they make her feel worse. Says she briefly thought about passive suicide for the first time in a year when this happened (1 month ago), experienced the thought for a few days, this has since resolved. Has  told her therapist [name] who is helpful. She currently denies SI. Says she is still angry at her sisters, though is not planning to engage with them bc they are a source of stress. Feels frustrated. Since ECT, endorses periods of getting lost in conversation, forgetfulness, forgetting names/appointment dates. Had an appointment with liver specialist, currently changing medications, worried about ammonia level. 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 Husband has helped manage medications over the past couple of weeks, though she is able to keep track of them. 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 Says she was scammed recently and lost some money ($300). Morning clonidine did not help with tongue movements during the day, just taking it at night.     Objective:    Virtual MOCA 08/2022  Naming: 3/3  Attention: 6/6  Language: 1/3  Abstraction: 2/2  Delayed Recall: 5/5  Orientation: 6/6  Total: 23/25    Mental Status Exam:  Appearance:    Appears stated age, Well nourished and Clean/Neat, sitting in chair in front of screen   Motor:   No abnormal movements or tics noted.    Speech/Language:    Normal rate, volume, tone, fluency and Language intact, well formed   Mood:   Okay   Affect:   Full, euthymic, mood congruent   Thought process and Associations:   Logical, linear, clear, coherent, goal directed   Abnormal/psychotic thought content:     Denies SI/HI.   Perceptual disturbances:     Behavior not concerning for response to internal stimuli     Other:   -       The patient reports they are physically located in [place] and is currently: at home. I conducted a audio/video visit. I spent  23m 28s on the video call with the patient. I spent an additional 30 minutes on pre- and post-visit activities on the date of service . chair in front of screen   Motor:   No abnormal movements or tics noted.    Speech/Language:    Normal rate, volume, tone, fluency and Language intact, well formed   Mood:   Good   Affect:   Full, euthymic, mood congruent   Thought process and Associations:   Logical, linear, clear, coherent, goal directed   Abnormal/psychotic thought content:     Denies SI/HI.   Perceptual disturbances:     Behavior not concerning for response to internal stimuli     Other:   -   {    Coding tips - Do not edit this text, it will delete upon signing of note!    Telephone visits [phone] for Physicians and APPs and [phone] for Non- Physician Clinicians)- Only use minutes on the phone to determine level of service.    Video visits [phone]) - Use either level of medical decision making just as an in-person visit OR time which includes both minutes on video and pre/post minutes to determine the level of service.      :[id number]}  The patient reports they are physically located in [place] and is currently: at home. I conducted a audio/video visit. I spent  0s on the video call with the patient. I spent an additional 30 minutes on pre- and post-visit activities on the date of service .

## 2022-08-27 NOTE — Unmapped (Signed)
Follow-up instructions:  -- Please continue taking your medications as prescribed for your mental health.   -- Do not make changes to your medications, including taking more or less than prescribed, unless under the supervision of your physician. Be aware that some medications may make you feel worse if abruptly stopped  -- Please refrain from using illicit substances, as these can affect your mood and could cause anxiety or other concerning symptoms.   -- Seek further medical care for any increase in symptoms or new symptoms such as thoughts of wanting to hurt yourself or hurt others.     Contact info:  Life-threatening emergencies: Call 911, the 988 suicide and crisis lifeline, or go to the nearest ER for medical or psychiatric attention.           Issues that need urgent attention but are not life threatening: Call the clinic outpatient front desk for assistance. [phone] for our [place] [facility] located at [address]. [phone] for our [place] [facility].    Non-urgent routine concerns and questions: Send a message through [facility] or call our clinic front desk.    Refill requests: Check with your pharmacy to initiate refill requests.    Regarding appointments:  - If you need to cancel your appointment, we ask that you call your clinic at least 24 hours before your scheduled appointment. [phone] for our [place] clinic located at [address]. [phone] for our [place] [facility] clinic.  - If for any reason you arrive 15 minutes later than your scheduled appointment time, you may not be seen and your visit may be rescheduled.  - Please remember that we will not automatically reschedule missed appointments.  - If you miss two (2) appointments without letting us know in advance, you will likely be referred to a provider in your community.  - We will do our best to be on time. Sometimes an emergency will arise that might cause your clinician to be late. We will try to inform you of this when you check in for your appointment. If you wait more than 15 minutes past your appointment time without such notice, please speak with the front desk staff.    In the event of bad weather, the clinic staff will attempt to contact you, should your appointment need to be rescheduled. Additionally, you can call the Patient Weather Line [phone] for system-wide clinic status    For more information and reminders regarding clinic policies (these were provided when you were admitted to the clinic), please ask the front desk.

## 2022-08-28 ENCOUNTER — Ambulatory Visit: Admit: 2022-08-28 | Discharge: 2022-08-29 | Payer: PRIVATE HEALTH INSURANCE

## 2022-08-28 MED ORDER — OZEMPIC 1 MG/DOSE (4 MG/3 ML) SUBCUTANEOUS PEN INJECTOR
3 refills | 0 days
Start: 2022-08-28 — End: ?

## 2022-08-28 NOTE — Unmapped (Signed)
COMPLEX CASE MANAGEMENT   FOLLOW UP NOTE  Summary:  Complex Case Manager  spoke with patient and verified correct patient using two identifiers today for Complex Case Management follow up. Patient currently resides at Home. Primary concern is none expressed.      Subjective:    Patient/caregiver reported she has been doing well. Patient had follow up with liver doctor and everything turned out well. Patient reported the liver doctor stopped her Xifaxin due to causing joint pain, nausea and dizziness. CM discussed medication review, but medications were reviewed on 08/26/22. CM reviewed allergies with patient. CM will follow up with patient in 4 weeks.       Objective:     Screenings Completed during visit: None completed at this visit    Barriers to care: Health Literacy    Interventions provided: Medication Reconciliation    Progress towards medication review goal : Completed 08/28/22    Plan:     1. Case Management to: Follow up in 4 weeks    2. Patient/caregiver to: call PCP if questions/concerns arise    Discuss at next outreach:  assess if symptoms have improved without being on Xifaxin    Care Coordination Note updated in [facility]: Yes    Future Appointments   Date Time Provider Department Center   09/20/2022  2:00 PM Debroah Loop, LCSW [facility]   10/01/2022  9:15 AM Brendia Sacks, MD [facility]   10/03/2022  1:00 PM Debroah Loop, LCSW [facility]   12/05/2022  9:40 AM Ro, Honor Junes, MD [id number]   02/13/2023  3:20 PM Metheny, Murriel Hopper, ANP [facility]       Problem List             Diagnosed      Mild intermittent asthma       Obesity       High cholesterol or triglycerides       High blood pressure       Abdominal hernia       Chronic migraine       Hernia       Low blood potassium       Cirrhosis       Folliculitis of right axilla       Yeast infection of the skin       Borderline diabetes       Obstructive sleep apnea syndrome, mild Jaw pain       Breast pain, right        Allergies:   Allergies   Allergen Reactions    Haldol [Haloperidol Lactate] Anxiety    Cyclosporine Nausea And Vomiting    Tetracycline Nausea Only       Medications:  Prior to Admission medications    Medication Dose, Route, Frequency   amitriptyline (ELAVIL) 10 MG tablet 10 mg, Oral, Nightly   cholestyramine-aspartame (CHOLESTYRAMINE LIGHT) 4 gram PwPk 0.5 packets, Oral, 2 times a day (standard)  Patient taking differently: Take 0.5 packets by mouth two (2) times a day as needed.   cloNIDine HCL (CATAPRES) 0.1 MG tablet 0.2 mg, Oral, Nightly   dicyclomine (BENTYL) 20 mg tablet 20 mg, Oral, 4 times a day PRN   famotidine (PEPCID) 20 MG tablet 20 mg, Oral, 2 times a day (standard)   hydrocortisone (ANUSOL-HC) 2.5 % rectal cream Rectal, 2 times a day (standard)   hydrOXYzine (ATARAX) 25 MG tablet 50 mg, Oral, Daily PRN  sertraline (ZOLOFT) 100 MG tablet 100 mg, Oral, Daily (standard)   sumatriptan (IMITREX) 50 MG tablet 50 mg, Oral, Every 2 hours PRN   XIFAXAN 550 mg Tab Take 1 tablet (550 mg total) by mouth Two (2) times a day.          Sherlyn Hay, RN  08/28/2022

## 2022-08-28 NOTE — Unmapped (Signed)
Patient is requesting the following refill  Requested Prescriptions     Pending Prescriptions Disp Refills    OZEMPIC 1 mg/dose (4 mg/3 mL) PnIj injection [Pharmacy Med Name: OZEMPIC PEN (1MG /DOSE) 4MG /3ML] 9 mL 3     Sig: INJECT 1 MG UNDER THE SKIN EVERY 7 DAYS       Recent Visits  Date Type Provider Dept   05/02/22 Office Visit Lucille Passy, MD [facility] [address] [facility]   02/14/22 Office Visit Ro, Honor Junes, MD [facility] [address] [facility]   12/11/21 Office Visit Ro, Honor Junes, MD [facility] [address] [facility]   10/11/21 Office Visit Ro, Honor Junes, MD [facility] [address] [place]   09/10/21 Office Visit Alexandria Lodge, MD [facility] [address] [place]   Showing recent visits within past 365 days and meeting all other requirements  Future Appointments  Date Type Provider Dept   12/05/22 Appointment Ro, Honor Junes, MD [facility] [address]    Showing future appointments within next 365 days and meeting all other requirements       Labs: A1c:   HGB A1C, POC (%)   Date Value   04/11/2016 5.6     Hemoglobin A1C (%)   Date Value   06/05/2021 4.5

## 2022-09-02 NOTE — Unmapped (Signed)
Could not reach patient , voicemail was left

## 2022-09-03 NOTE — Unmapped (Signed)
Alexis Burns has been contacted in regards to their refill of XIFAXAN. At this time, they have declined refill due to  side effects. Provider advise pt to stop taking for 2 weeks . Refill assessment call date has been updated per the patient's request.

## 2022-09-08 MED ORDER — OZEMPIC 1 MG/DOSE (4 MG/3 ML) SUBCUTANEOUS PEN INJECTOR
3 refills | 0 days | Status: CP
Start: 2022-09-08 — End: ?

## 2022-09-11 NOTE — Unmapped (Signed)
I was available. I discussed the case with the resident at length. I reviewed the resident???s note and edited where appropriate. I agree with the resident???s findings and plan.     Glendale Youngblood Brandon Yobana Culliton, MD

## 2022-09-18 NOTE — Unmapped (Signed)
Specialty Medication(s): Xifaxan 550mg     Ms.Rosenboom has been dis-enrolled from the [facility] specialty pharmacy services due to  the patient reporting that she no longer takes Xifaxan .     Based on the Epic progress note dated 08/26/22, she is to trial off Xifaxan for 2 weeks and monitor symptoms and use lactulose if needed,    I can re-enroll the patient to receive this medication in the future if needed.      Additional information provided to the patient: n/a    Roderic Palau, PharmD  [facility] Specialty Pharmacist

## 2022-09-18 NOTE — Unmapped (Signed)
Alexis Burns has been contacted in regards to their refill of XIFAXAN. At this time, they have declined refill due to  no longer taking medication . Refill assessment call date has been updated per the patient's request.

## 2022-09-20 ENCOUNTER — Telehealth: Payer: PRIVATE HEALTH INSURANCE

## 2022-09-20 DIAGNOSIS — F32A Depression, unspecified depression type: Principal | ICD-10-CM

## 2022-09-20 DIAGNOSIS — F411 Generalized anxiety disorder: Principal | ICD-10-CM

## 2022-09-25 ENCOUNTER — Ambulatory Visit: Admit: 2022-09-25 | Discharge: 2022-09-26 | Payer: PRIVATE HEALTH INSURANCE

## 2022-09-25 MED FILL — XIFAXAN 550 MG TABLET: ORAL | 30 days supply | Qty: 60 | Fill #1

## 2022-10-01 ENCOUNTER — Telehealth: Admit: 2022-10-01 | Discharge: 2022-10-02 | Payer: PRIVATE HEALTH INSURANCE

## 2022-10-01 DIAGNOSIS — F411 Generalized anxiety disorder: Principal | ICD-10-CM

## 2022-10-01 DIAGNOSIS — F332 Major depressive disorder, recurrent severe without psychotic features: Principal | ICD-10-CM

## 2022-10-02 ENCOUNTER — Ambulatory Visit: Admit: 2022-10-02 | Discharge: 2022-10-03 | Payer: PRIVATE HEALTH INSURANCE

## 2022-10-03 ENCOUNTER — Telehealth: Admit: 2022-10-03 | Discharge: 2022-10-04 | Payer: PRIVATE HEALTH INSURANCE | Attending: Clinical | Primary: Clinical

## 2022-10-03 DIAGNOSIS — F32A Depression, unspecified depression type: Principal | ICD-10-CM

## 2022-10-03 DIAGNOSIS — F411 Generalized anxiety disorder: Principal | ICD-10-CM

## 2022-10-09 ENCOUNTER — Ambulatory Visit: Admit: 2022-10-09 | Discharge: 2022-10-10 | Payer: PRIVATE HEALTH INSURANCE

## 2022-10-09 DIAGNOSIS — J4520 Mild intermittent asthma, uncomplicated: Principal | ICD-10-CM

## 2022-10-09 DIAGNOSIS — R109 Unspecified abdominal pain: Principal | ICD-10-CM

## 2022-10-09 DIAGNOSIS — N200 Calculus of kidney: Principal | ICD-10-CM

## 2022-10-09 DIAGNOSIS — R7303 Prediabetes: Principal | ICD-10-CM

## 2022-10-09 MED ORDER — TAMSULOSIN 0.4 MG CAPSULE
ORAL_CAPSULE | Freq: Every day | ORAL | 1 refills | 30 days | Status: CP
Start: 2022-10-09 — End: 2023-10-09

## 2022-10-22 ENCOUNTER — Telehealth: Admit: 2022-10-22 | Discharge: 2022-10-23 | Payer: PRIVATE HEALTH INSURANCE | Attending: Clinical | Primary: Clinical

## 2022-10-22 DIAGNOSIS — F32A Depression, unspecified depression type: Principal | ICD-10-CM

## 2022-10-22 DIAGNOSIS — F411 Generalized anxiety disorder: Principal | ICD-10-CM

## 2022-10-22 DIAGNOSIS — R112 Nausea with vomiting, unspecified: Principal | ICD-10-CM

## 2022-10-22 MED ORDER — DICYCLOMINE 20 MG TABLET
ORAL_TABLET | Freq: Four times a day (QID) | ORAL | 5 refills | 30 days | PRN
Start: 2022-10-22 — End: ?

## 2022-10-22 MED ORDER — SERTRALINE 100 MG TABLET
ORAL_TABLET | Freq: Every day | ORAL | 0 refills | 90 days
Start: 2022-10-22 — End: 2023-01-20

## 2022-10-23 ENCOUNTER — Ambulatory Visit: Admit: 2022-10-23 | Discharge: 2022-10-24 | Payer: PRIVATE HEALTH INSURANCE

## 2022-10-23 DIAGNOSIS — G8929 Other chronic pain: Principal | ICD-10-CM

## 2022-10-23 MED ORDER — DICYCLOMINE 20 MG TABLET
ORAL_TABLET | Freq: Four times a day (QID) | ORAL | 5 refills | 30 days | Status: CP | PRN
Start: 2022-10-23 — End: ?
  Filled 2022-12-27: qty 120, 30d supply, fill #0

## 2022-10-23 MED FILL — XIFAXAN 550 MG TABLET: ORAL | 30 days supply | Qty: 60 | Fill #2

## 2022-11-06 ENCOUNTER — Ambulatory Visit: Admit: 2022-11-06 | Discharge: 2022-11-07 | Payer: PRIVATE HEALTH INSURANCE

## 2022-11-19 ENCOUNTER — Telehealth: Admit: 2022-11-19 | Discharge: 2022-11-20 | Payer: BLUE CROSS/BLUE SHIELD | Attending: Clinical | Primary: Clinical

## 2022-11-19 ENCOUNTER — Ambulatory Visit: Admit: 2022-11-19 | Discharge: 2022-11-20 | Payer: BLUE CROSS/BLUE SHIELD

## 2022-11-19 DIAGNOSIS — N200 Calculus of kidney: Principal | ICD-10-CM

## 2022-11-19 DIAGNOSIS — F411 Generalized anxiety disorder: Principal | ICD-10-CM

## 2022-11-19 DIAGNOSIS — F32A Depression, unspecified depression type: Principal | ICD-10-CM

## 2022-11-19 DIAGNOSIS — Z23 Encounter for immunization: Principal | ICD-10-CM

## 2022-11-19 DIAGNOSIS — F332 Major depressive disorder, recurrent severe without psychotic features: Principal | ICD-10-CM

## 2022-11-19 DIAGNOSIS — N644 Mastodynia: Principal | ICD-10-CM

## 2022-11-19 DIAGNOSIS — M545 Acute left-sided low back pain without sciatica: Principal | ICD-10-CM

## 2022-11-21 ENCOUNTER — Ambulatory Visit: Admit: 2022-11-21 | Discharge: 2022-11-22 | Payer: MEDICARE | Attending: Adult Health | Primary: Adult Health

## 2022-11-21 DIAGNOSIS — N644 Mastodynia: Principal | ICD-10-CM

## 2022-11-21 DIAGNOSIS — N6311 Unspecified lump in the right breast, upper outer quadrant: Principal | ICD-10-CM

## 2022-11-26 ENCOUNTER — Ambulatory Visit: Admit: 2022-11-26 | Discharge: 2022-11-27 | Payer: PRIVATE HEALTH INSURANCE

## 2022-12-03 MED FILL — XIFAXAN 550 MG TABLET: ORAL | 30 days supply | Qty: 60 | Fill #3

## 2022-12-04 ENCOUNTER — Telehealth: Admit: 2022-12-04 | Discharge: 2022-12-05 | Payer: PRIVATE HEALTH INSURANCE | Attending: Clinical | Primary: Clinical

## 2022-12-04 DIAGNOSIS — F411 Generalized anxiety disorder: Principal | ICD-10-CM

## 2022-12-04 DIAGNOSIS — M545 Acute left-sided low back pain without sciatica: Principal | ICD-10-CM

## 2022-12-04 DIAGNOSIS — F32A Depression, unspecified depression type: Principal | ICD-10-CM

## 2022-12-05 ENCOUNTER — Ambulatory Visit
Admit: 2022-12-05 | Discharge: 2022-12-06 | Payer: PRIVATE HEALTH INSURANCE | Attending: Family Medicine | Primary: Family Medicine

## 2022-12-05 DIAGNOSIS — K7460 Unspecified cirrhosis of liver: Principal | ICD-10-CM

## 2022-12-05 DIAGNOSIS — I10 Essential (primary) hypertension: Principal | ICD-10-CM

## 2022-12-05 DIAGNOSIS — R7303 Prediabetes: Principal | ICD-10-CM

## 2022-12-05 DIAGNOSIS — E66813 Class 3 severe obesity with serious comorbidity and body mass index (BMI) of 40.0 to 44.9 in adult, unspecified obesity type (CMS-HCC): Principal | ICD-10-CM

## 2022-12-05 DIAGNOSIS — Z6841 Body Mass Index (BMI) 40.0 and over, adult: Principal | ICD-10-CM

## 2022-12-10 ENCOUNTER — Telehealth: Admit: 2022-12-10 | Discharge: 2022-12-11 | Payer: PRIVATE HEALTH INSURANCE

## 2022-12-10 DIAGNOSIS — F32A Depression, unspecified depression type: Principal | ICD-10-CM

## 2022-12-10 DIAGNOSIS — F411 Generalized anxiety disorder: Principal | ICD-10-CM

## 2022-12-10 DIAGNOSIS — F959 Tic disorder, unspecified: Principal | ICD-10-CM

## 2022-12-10 MED ORDER — SERTRALINE 100 MG TABLET
ORAL_TABLET | Freq: Every day | ORAL | 2 refills | 30 days | Status: CP
Start: 2022-12-10 — End: 2023-03-10

## 2022-12-10 MED ORDER — HYDROXYZINE HCL 25 MG TABLET
ORAL_TABLET | Freq: Three times a day (TID) | ORAL | 2 refills | 0.00000 days | Status: CP | PRN
Start: 2022-12-10 — End: 2022-12-10

## 2022-12-10 MED ORDER — CLONIDINE HCL 0.1 MG TABLET
ORAL_TABLET | ORAL | 0 refills | 90 days | Status: CP
Start: 2022-12-10 — End: ?

## 2022-12-10 MED ORDER — SERTRALINE 25 MG TABLET
ORAL_TABLET | Freq: Every day | ORAL | 2 refills | 30 days | Status: CP
Start: 2022-12-10 — End: 2023-03-10

## 2022-12-16 ENCOUNTER — Ambulatory Visit
Admit: 2022-12-16 | Discharge: 2022-12-17 | Payer: PRIVATE HEALTH INSURANCE | Attending: Student in an Organized Health Care Education/Training Program | Primary: Student in an Organized Health Care Education/Training Program

## 2022-12-16 ENCOUNTER — Ambulatory Visit: Admit: 2022-12-16 | Discharge: 2022-12-17 | Payer: PRIVATE HEALTH INSURANCE

## 2022-12-16 DIAGNOSIS — M7918 Myalgia, other site: Principal | ICD-10-CM

## 2022-12-16 DIAGNOSIS — M47816 Spondylosis without myelopathy or radiculopathy, lumbar region: Principal | ICD-10-CM

## 2022-12-16 DIAGNOSIS — M545 Acute left-sided low back pain without sciatica: Principal | ICD-10-CM

## 2022-12-18 ENCOUNTER — Telehealth: Admit: 2022-12-18 | Discharge: 2022-12-19 | Payer: PRIVATE HEALTH INSURANCE | Attending: Clinical | Primary: Clinical

## 2022-12-18 DIAGNOSIS — F332 Major depressive disorder, recurrent severe without psychotic features: Principal | ICD-10-CM

## 2022-12-18 DIAGNOSIS — F419 Anxiety disorder, unspecified: Principal | ICD-10-CM

## 2023-01-01 ENCOUNTER — Telehealth: Admit: 2023-01-01 | Discharge: 2023-01-02 | Payer: PRIVATE HEALTH INSURANCE | Attending: Clinical | Primary: Clinical

## 2023-01-01 DIAGNOSIS — F411 Generalized anxiety disorder: Principal | ICD-10-CM

## 2023-01-01 DIAGNOSIS — F32A Depression, unspecified depression type: Principal | ICD-10-CM

## 2023-01-03 DIAGNOSIS — Z6841 Body Mass Index (BMI) 40.0 and over, adult: Principal | ICD-10-CM

## 2023-01-03 DIAGNOSIS — E66813 Class 3 severe obesity with serious comorbidity and body mass index (BMI) of 40.0 to 44.9 in adult, unspecified obesity type (CMS-HCC): Principal | ICD-10-CM

## 2023-01-06 MED FILL — XIFAXAN 550 MG TABLET: ORAL | 30 days supply | Qty: 60 | Fill #4

## 2023-01-07 ENCOUNTER — Telehealth: Admit: 2023-01-07 | Discharge: 2023-01-08 | Payer: BLUE CROSS/BLUE SHIELD

## 2023-01-07 DIAGNOSIS — E66813 Class 3 severe obesity with serious comorbidity and body mass index (BMI) of 40.0 to 44.9 in adult, unspecified obesity type (CMS-HCC): Principal | ICD-10-CM

## 2023-01-07 DIAGNOSIS — Z6841 Body Mass Index (BMI) 40.0 and over, adult: Principal | ICD-10-CM

## 2023-01-20 ENCOUNTER — Telehealth: Admit: 2023-01-20 | Discharge: 2023-01-21 | Payer: BLUE CROSS/BLUE SHIELD | Attending: Clinical | Primary: Clinical

## 2023-01-20 DIAGNOSIS — F32A Depression, unspecified depression type: Principal | ICD-10-CM

## 2023-01-20 DIAGNOSIS — F411 Generalized anxiety disorder: Principal | ICD-10-CM

## 2023-01-21 ENCOUNTER — Telehealth: Admit: 2023-01-21 | Discharge: 2023-01-22 | Payer: BLUE CROSS/BLUE SHIELD

## 2023-01-21 DIAGNOSIS — F411 Generalized anxiety disorder: Principal | ICD-10-CM

## 2023-01-21 DIAGNOSIS — F32A Depression, unspecified depression type: Principal | ICD-10-CM

## 2023-01-21 DIAGNOSIS — F959 Tic disorder, unspecified: Principal | ICD-10-CM

## 2023-01-21 MED ORDER — SERTRALINE 25 MG TABLET
ORAL_TABLET | Freq: Every day | ORAL | 1 refills | 90 days | Status: CP
Start: 2023-01-21 — End: 2023-07-20

## 2023-01-21 MED ORDER — SERTRALINE 100 MG TABLET
ORAL_TABLET | Freq: Every day | ORAL | 1 refills | 90 days | Status: CP
Start: 2023-01-21 — End: 2023-07-20

## 2023-01-21 MED ORDER — CLONIDINE HCL 0.1 MG TABLET
ORAL_TABLET | Freq: Every evening | ORAL | 0 refills | 90 days | Status: CP
Start: 2023-01-21 — End: ?

## 2023-01-22 ENCOUNTER — Ambulatory Visit: Admit: 2023-01-22 | Discharge: 2023-01-23 | Payer: BLUE CROSS/BLUE SHIELD

## 2023-01-22 DIAGNOSIS — F332 Major depressive disorder, recurrent severe without psychotic features: Principal | ICD-10-CM

## 2023-01-23 ENCOUNTER — Ambulatory Visit: Admit: 2023-01-23 | Discharge: 2023-01-24 | Payer: BLUE CROSS/BLUE SHIELD

## 2023-01-28 DIAGNOSIS — K7682 Hepatic encephalopathy (CMS-HCC): Principal | ICD-10-CM

## 2023-01-28 DIAGNOSIS — K7460 Unspecified cirrhosis of liver: Principal | ICD-10-CM

## 2023-01-28 MED FILL — XIFAXAN 550 MG TABLET: ORAL | 30 days supply | Qty: 60 | Fill #5

## 2023-02-04 ENCOUNTER — Telehealth: Admit: 2023-02-04 | Discharge: 2023-02-05 | Payer: BLUE CROSS/BLUE SHIELD | Attending: Clinical | Primary: Clinical

## 2023-02-04 ENCOUNTER — Telehealth: Admit: 2023-02-04 | Discharge: 2023-02-05 | Payer: BLUE CROSS/BLUE SHIELD

## 2023-02-04 DIAGNOSIS — F32A Depression, unspecified depression type: Principal | ICD-10-CM

## 2023-02-04 DIAGNOSIS — F411 Generalized anxiety disorder: Principal | ICD-10-CM

## 2023-02-04 DIAGNOSIS — R519 Sinus headache: Principal | ICD-10-CM

## 2023-02-13 ENCOUNTER — Ambulatory Visit: Admit: 2023-02-13 | Discharge: 2023-02-14 | Payer: BLUE CROSS/BLUE SHIELD

## 2023-02-13 DIAGNOSIS — K7469 Other cirrhosis of liver: Principal | ICD-10-CM

## 2023-02-13 DIAGNOSIS — K7581 Nonalcoholic steatohepatitis (NASH): Principal | ICD-10-CM

## 2023-02-18 ENCOUNTER — Telehealth: Admit: 2023-02-18 | Discharge: 2023-02-19 | Payer: BLUE CROSS/BLUE SHIELD

## 2023-02-18 DIAGNOSIS — Z6841 Body Mass Index (BMI) 40.0 and over, adult: Principal | ICD-10-CM

## 2023-02-18 DIAGNOSIS — E66813 Class 3 severe obesity with serious comorbidity and body mass index (BMI) of 40.0 to 44.9 in adult, unspecified obesity type (CMS-HCC): Principal | ICD-10-CM

## 2023-02-19 ENCOUNTER — Ambulatory Visit
Admit: 2023-02-19 | Discharge: 2023-02-20 | Payer: BLUE CROSS/BLUE SHIELD | Attending: Student in an Organized Health Care Education/Training Program | Primary: Student in an Organized Health Care Education/Training Program

## 2023-02-19 DIAGNOSIS — M7918 Myalgia, other site: Principal | ICD-10-CM

## 2023-02-19 DIAGNOSIS — M47816 Spondylosis without myelopathy or radiculopathy, lumbar region: Principal | ICD-10-CM

## 2023-02-24 ENCOUNTER — Ambulatory Visit
Admit: 2023-02-24 | Discharge: 2023-02-25 | Payer: BLUE CROSS/BLUE SHIELD | Attending: Anesthesiology | Primary: Anesthesiology

## 2023-02-24 DIAGNOSIS — G894 Chronic pain syndrome: Principal | ICD-10-CM

## 2023-02-24 DIAGNOSIS — G8929 Other chronic pain: Principal | ICD-10-CM

## 2023-03-04 DIAGNOSIS — M542 Cervicalgia: Principal | ICD-10-CM

## 2023-03-04 DIAGNOSIS — M47816 Spondylosis without myelopathy or radiculopathy, lumbar region: Principal | ICD-10-CM

## 2023-03-04 DIAGNOSIS — M7918 Myalgia, other site: Principal | ICD-10-CM

## 2023-03-06 ENCOUNTER — Encounter: Admit: 2023-03-06 | Discharge: 2023-03-07 | Payer: MEDICARE | Attending: Clinical | Primary: Clinical

## 2023-03-06 DIAGNOSIS — F32A Depression, unspecified depression type: Principal | ICD-10-CM

## 2023-03-06 DIAGNOSIS — F411 Generalized anxiety disorder: Principal | ICD-10-CM

## 2023-03-13 MED FILL — XIFAXAN 550 MG TABLET: ORAL | 30 days supply | Qty: 60 | Fill #6

## 2023-04-01 ENCOUNTER — Encounter: Admit: 2023-04-01 | Discharge: 2023-04-02 | Payer: MEDICARE

## 2023-04-01 DIAGNOSIS — G4700 Insomnia, unspecified: Principal | ICD-10-CM

## 2023-04-01 DIAGNOSIS — F411 Generalized anxiety disorder: Principal | ICD-10-CM

## 2023-04-01 DIAGNOSIS — F959 Tic disorder, unspecified: Principal | ICD-10-CM

## 2023-04-01 DIAGNOSIS — F332 Major depressive disorder, recurrent severe without psychotic features: Principal | ICD-10-CM

## 2023-04-01 MED ORDER — RAMELTEON 8 MG TABLET
ORAL_TABLET | Freq: Every evening | ORAL | 2 refills | 30.00 days | Status: CP
Start: 2023-04-01 — End: 2023-06-30

## 2023-04-01 MED ORDER — HYDROXYZINE HCL 25 MG TABLET
ORAL_TABLET | Freq: Three times a day (TID) | ORAL | 2 refills | 30.00 days | Status: CP | PRN
Start: 2023-04-01 — End: 2023-06-30

## 2023-04-01 MED ORDER — SERTRALINE 50 MG TABLET
ORAL_TABLET | Freq: Every day | ORAL | 2 refills | 30.00 days | Status: CP
Start: 2023-04-01 — End: 2023-06-30

## 2023-04-01 MED ORDER — CLONIDINE HCL 0.1 MG TABLET
ORAL_TABLET | Freq: Every evening | ORAL | 0 refills | 90 days | Status: CP
Start: 2023-04-01 — End: ?

## 2023-04-03 ENCOUNTER — Encounter: Admit: 2023-04-03 | Discharge: 2023-04-04 | Payer: MEDICARE | Attending: Clinical | Primary: Clinical

## 2023-04-03 DIAGNOSIS — F332 Major depressive disorder, recurrent severe without psychotic features: Principal | ICD-10-CM

## 2023-04-04 MED FILL — XIFAXAN 550 MG TABLET: ORAL | 30 days supply | Qty: 60 | Fill #7

## 2023-04-28 ENCOUNTER — Encounter
Admit: 2023-04-28 | Discharge: 2023-04-29 | Payer: BLUE CROSS/BLUE SHIELD | Attending: Student in an Organized Health Care Education/Training Program | Primary: Student in an Organized Health Care Education/Training Program

## 2023-04-28 DIAGNOSIS — M47816 Spondylosis without myelopathy or radiculopathy, lumbar region: Principal | ICD-10-CM

## 2023-04-28 DIAGNOSIS — M7918 Myalgia, other site: Principal | ICD-10-CM

## 2023-04-28 MED ORDER — METHOCARBAMOL 500 MG TABLET
ORAL_TABLET | Freq: Every evening | ORAL | 1 refills | 30.00 days | Status: CP | PRN
Start: 2023-04-28 — End: ?

## 2023-04-29 ENCOUNTER — Encounter: Admit: 2023-04-29 | Discharge: 2023-04-29 | Payer: BLUE CROSS/BLUE SHIELD

## 2023-04-29 ENCOUNTER — Encounter: Admit: 2023-04-29 | Discharge: 2023-04-29 | Payer: BLUE CROSS/BLUE SHIELD | Attending: Clinical | Primary: Clinical

## 2023-04-29 DIAGNOSIS — F411 Generalized anxiety disorder: Principal | ICD-10-CM

## 2023-04-29 DIAGNOSIS — F332 Major depressive disorder, recurrent severe without psychotic features: Principal | ICD-10-CM

## 2023-05-01 MED ORDER — DULOXETINE 30 MG CAPSULE,DELAYED RELEASE
ORAL_CAPSULE | ORAL | 0 refills | 60.00 days | Status: CP
Start: 2023-05-01 — End: 2023-06-30

## 2023-05-01 MED ORDER — SERTRALINE 50 MG TABLET
ORAL_TABLET | ORAL | 0 refills | 60.00 days | Status: CP
Start: 2023-05-01 — End: 2023-06-30

## 2023-05-02 MED ORDER — DULOXETINE 30 MG CAPSULE,DELAYED RELEASE
ORAL_CAPSULE | Freq: Every day | ORAL | 0 refills | 20.00 days | Status: CP
Start: 2023-05-02 — End: 2023-05-22

## 2023-05-07 MED FILL — XIFAXAN 550 MG TABLET: ORAL | 30 days supply | Qty: 60 | Fill #8

## 2023-05-08 DIAGNOSIS — F959 Tic disorder, unspecified: Principal | ICD-10-CM

## 2023-05-08 MED ORDER — CLONIDINE HCL 0.1 MG TABLET
ORAL_TABLET | Freq: Every evening | ORAL | 0 refills | 90.00 days
Start: 2023-05-08 — End: ?

## 2023-05-12 MED ORDER — CLONIDINE HCL 0.1 MG TABLET
ORAL_TABLET | Freq: Every evening | ORAL | 0 refills | 90 days | Status: CP
Start: 2023-05-12 — End: ?

## 2023-05-14 ENCOUNTER — Emergency Department
Admit: 2023-05-14 | Discharge: 2023-05-14 | Disposition: A | Discharge status: Home / Self Care | Payer: BLUE CROSS/BLUE SHIELD | Attending: Family

## 2023-05-14 DIAGNOSIS — R109 Unspecified abdominal pain: Principal | ICD-10-CM

## 2023-05-14 DIAGNOSIS — R0602 Shortness of breath: Principal | ICD-10-CM

## 2023-05-14 MED ORDER — OXYCODONE 5 MG TABLET
ORAL_TABLET | ORAL | 0 refills | 2.00 days | Status: CP | PRN
Start: 2023-05-14 — End: 2023-05-19

## 2023-05-16 DIAGNOSIS — F959 Tic disorder, unspecified: Principal | ICD-10-CM

## 2023-05-16 MED ORDER — CLONIDINE HCL 0.1 MG TABLET
ORAL_TABLET | Freq: Every evening | ORAL | 0 refills | 90 days | Status: CP
Start: 2023-05-16 — End: ?

## 2023-05-20 ENCOUNTER — Ambulatory Visit
Admit: 2023-05-20 | Discharge: 2023-05-21 | Payer: BLUE CROSS/BLUE SHIELD | Attending: Anesthesiology | Primary: Anesthesiology

## 2023-05-20 DIAGNOSIS — M797 Fibromyalgia: Principal | ICD-10-CM

## 2023-05-20 DIAGNOSIS — N644 Mastodynia: Principal | ICD-10-CM

## 2023-05-20 DIAGNOSIS — R5383 Other fatigue: Principal | ICD-10-CM

## 2023-05-20 DIAGNOSIS — G894 Chronic pain syndrome: Principal | ICD-10-CM

## 2023-05-20 DIAGNOSIS — M7918 Myalgia, other site: Principal | ICD-10-CM

## 2023-05-20 DIAGNOSIS — R112 Nausea with vomiting, unspecified: Principal | ICD-10-CM

## 2023-05-20 MED ORDER — GABAPENTIN 100 MG CAPSULE
ORAL_CAPSULE | Freq: Every evening | ORAL | 0 refills | 90.00 days | Status: CP
Start: 2023-05-20 — End: 2023-08-18

## 2023-05-20 MED ORDER — AMITRIPTYLINE 10 MG TABLET
ORAL_TABLET | Freq: Every evening | ORAL | 5 refills | 0.00 days
Start: 2023-05-20 — End: ?

## 2023-05-22 MED ORDER — DULOXETINE 60 MG CAPSULE,DELAYED RELEASE
ORAL_CAPSULE | Freq: Every day | ORAL | 2 refills | 30.00 days | Status: CP
Start: 2023-05-22 — End: 2023-08-20

## 2023-05-23 ENCOUNTER — Ambulatory Visit: Admit: 2023-05-23 | Discharge: 2023-05-24 | Payer: BLUE CROSS/BLUE SHIELD

## 2023-05-28 ENCOUNTER — Encounter
Admit: 2023-05-28 | Discharge: 2023-05-29 | Payer: BLUE CROSS/BLUE SHIELD | Attending: Student in an Organized Health Care Education/Training Program | Primary: Student in an Organized Health Care Education/Training Program

## 2023-05-28 DIAGNOSIS — M7918 Myalgia, other site: Principal | ICD-10-CM

## 2023-05-28 DIAGNOSIS — M47816 Spondylosis without myelopathy or radiculopathy, lumbar region: Principal | ICD-10-CM

## 2023-05-28 MED ORDER — AMITRIPTYLINE 10 MG TABLET
ORAL_TABLET | Freq: Every evening | ORAL | 5 refills | 60 days
Start: 2023-05-28 — End: ?

## 2023-06-03 DIAGNOSIS — M545 Acute left-sided low back pain without sciatica: Principal | ICD-10-CM

## 2023-06-03 MED ORDER — OXYCODONE 5 MG TABLET
ORAL_TABLET | ORAL | 0 refills | 2 days | Status: CP | PRN
Start: 2023-06-03 — End: 2023-06-08

## 2023-06-06 MED FILL — XIFAXAN 550 MG TABLET: ORAL | 30 days supply | Qty: 60 | Fill #9

## 2023-06-10 ENCOUNTER — Encounter: Admit: 2023-06-10 | Discharge: 2023-06-11

## 2023-06-10 DIAGNOSIS — F324 Major depressive disorder, single episode, in partial remission: Principal | ICD-10-CM

## 2023-06-10 DIAGNOSIS — F411 Generalized anxiety disorder: Principal | ICD-10-CM

## 2023-06-10 DIAGNOSIS — G4700 Insomnia, unspecified: Principal | ICD-10-CM

## 2023-07-02 ENCOUNTER — Emergency Department
Admit: 2023-07-02 | Discharge: 2023-07-02 | Disposition: A | Discharge status: Home / Self Care | Payer: BLUE CROSS/BLUE SHIELD

## 2023-07-02 DIAGNOSIS — R002 Palpitations: Principal | ICD-10-CM

## 2023-07-02 DIAGNOSIS — R079 Chest pain, unspecified: Principal | ICD-10-CM

## 2023-07-03 MED FILL — XIFAXAN 550 MG TABLET: ORAL | 30 days supply | Qty: 60 | Fill #10

## 2023-07-08 ENCOUNTER — Encounter: Admit: 2023-07-08 | Discharge: 2023-07-09 | Payer: BLUE CROSS/BLUE SHIELD | Attending: Clinical | Primary: Clinical

## 2023-07-08 ENCOUNTER — Encounter: Admit: 2023-07-08 | Discharge: 2023-07-09 | Payer: BLUE CROSS/BLUE SHIELD

## 2023-07-08 DIAGNOSIS — M797 Fibromyalgia: Principal | ICD-10-CM

## 2023-07-08 DIAGNOSIS — M545 Acute left-sided low back pain without sciatica: Principal | ICD-10-CM

## 2023-07-08 DIAGNOSIS — F324 Major depressive disorder, single episode, in partial remission: Principal | ICD-10-CM

## 2023-07-08 MED ORDER — OXYCODONE 5 MG TABLET
ORAL_TABLET | ORAL | 0 refills | 2.00000 days | Status: CP | PRN
Start: 2023-07-08 — End: 2023-07-13

## 2023-07-14 ENCOUNTER — Ambulatory Visit: Admit: 2023-07-14 | Discharge: 2023-07-14 | Payer: MEDICARE

## 2023-07-14 ENCOUNTER — Ambulatory Visit: Admit: 2023-07-14 | Discharge: 2023-07-14 | Payer: MEDICARE | Attending: Anesthesiology | Primary: Anesthesiology

## 2023-07-14 DIAGNOSIS — M797 Fibromyalgia: Principal | ICD-10-CM

## 2023-07-14 MED ORDER — GABAPENTIN 100 MG CAPSULE
ORAL_CAPSULE | Freq: Every evening | ORAL | 0 refills | 90.00000 days | Status: CP
Start: 2023-07-14 — End: 2023-10-12
  Filled 2023-07-16: qty 180, 90d supply, fill #0

## 2023-07-16 ENCOUNTER — Encounter: Admit: 2023-07-16 | Discharge: 2023-07-17 | Payer: MEDICARE | Attending: Clinical | Primary: Clinical

## 2023-07-16 DIAGNOSIS — M545 Acute left-sided low back pain without sciatica: Principal | ICD-10-CM

## 2023-07-16 MED ORDER — OXYCODONE 5 MG TABLET
ORAL_TABLET | ORAL | 0 refills | 2.00000 days | Status: CP | PRN
Start: 2023-07-16 — End: 2023-07-21

## 2023-07-17 ENCOUNTER — Encounter: Admit: 2023-07-17 | Discharge: 2023-07-18 | Payer: MEDICARE | Attending: Clinical | Primary: Clinical

## 2023-07-17 DIAGNOSIS — F411 Generalized anxiety disorder: Principal | ICD-10-CM

## 2023-07-17 DIAGNOSIS — F332 Major depressive disorder, recurrent severe without psychotic features: Principal | ICD-10-CM

## 2023-07-17 DIAGNOSIS — F324 Major depressive disorder, single episode, in partial remission: Principal | ICD-10-CM

## 2023-07-17 MED ORDER — DULOXETINE 60 MG CAPSULE,DELAYED RELEASE
ORAL_CAPSULE | Freq: Every day | ORAL | 2 refills | 30.00000 days
Start: 2023-07-17 — End: 2023-10-15

## 2023-07-21 MED ORDER — DULOXETINE 60 MG CAPSULE,DELAYED RELEASE
ORAL_CAPSULE | Freq: Two times a day (BID) | ORAL | 2 refills | 30.00000 days | Status: CP
Start: 2023-07-21 — End: 2023-10-19

## 2023-07-23 ENCOUNTER — Ambulatory Visit
Admit: 2023-07-23 | Discharge: 2023-07-24 | Payer: MEDICARE | Attending: Student in an Organized Health Care Education/Training Program | Primary: Student in an Organized Health Care Education/Training Program

## 2023-07-23 DIAGNOSIS — I10 Essential (primary) hypertension: Principal | ICD-10-CM

## 2023-07-23 DIAGNOSIS — R5383 Other fatigue: Principal | ICD-10-CM

## 2023-07-23 DIAGNOSIS — E785 Hyperlipidemia, unspecified: Principal | ICD-10-CM

## 2023-07-23 DIAGNOSIS — R0609 Other forms of dyspnea: Principal | ICD-10-CM

## 2023-07-23 DIAGNOSIS — R0789 Other chest pain: Principal | ICD-10-CM

## 2023-07-23 DIAGNOSIS — R42 Dizziness and giddiness: Principal | ICD-10-CM

## 2023-07-23 DIAGNOSIS — R0600 Dyspnea, unspecified: Principal | ICD-10-CM

## 2023-07-23 DIAGNOSIS — R072 Precordial pain: Principal | ICD-10-CM

## 2023-07-23 DIAGNOSIS — E7849 Other hyperlipidemia: Principal | ICD-10-CM

## 2023-07-23 MED ORDER — CHLORTHALIDONE 25 MG TABLET
ORAL_TABLET | Freq: Every morning | ORAL | 3 refills | 100.00000 days | Status: CP
Start: 2023-07-23 — End: 2024-08-26

## 2023-07-24 ENCOUNTER — Inpatient Hospital Stay: Admit: 2023-07-24 | Discharge: 2023-07-25 | Payer: MEDICARE

## 2023-07-26 ENCOUNTER — Emergency Department: Admit: 2023-07-26 | Discharge: 2023-07-27 | Disposition: A | Discharge status: Home / Self Care | Payer: MEDICARE

## 2023-07-26 DIAGNOSIS — W19XXXA Unspecified fall, initial encounter: Principal | ICD-10-CM

## 2023-07-26 DIAGNOSIS — S92354A Nondisplaced fracture of fifth metatarsal bone, right foot, initial encounter for closed fracture: Principal | ICD-10-CM

## 2023-07-26 MED ORDER — OXYCODONE 5 MG TABLET
ORAL_TABLET | Freq: Two times a day (BID) | ORAL | 0 refills | 5.00000 days | Status: CP | PRN
Start: 2023-07-26 — End: 2023-07-31

## 2023-08-04 ENCOUNTER — Ambulatory Visit: Admit: 2023-08-04 | Discharge: 2023-08-05 | Payer: MEDICARE

## 2023-08-04 DIAGNOSIS — S92354A Nondisplaced fracture of fifth metatarsal bone, right foot, initial encounter for closed fracture: Principal | ICD-10-CM

## 2023-08-04 MED ORDER — OXYCODONE 5 MG TABLET
ORAL_TABLET | Freq: Four times a day (QID) | ORAL | 0 refills | 10.00000 days | Status: CP | PRN
Start: 2023-08-04 — End: ?

## 2023-08-05 ENCOUNTER — Ambulatory Visit: Admit: 2023-08-05 | Discharge: 2023-08-06 | Payer: MEDICARE

## 2023-08-05 DIAGNOSIS — K7460 Unspecified cirrhosis of liver: Principal | ICD-10-CM

## 2023-08-05 DIAGNOSIS — Z1231 Encounter for screening mammogram for malignant neoplasm of breast: Principal | ICD-10-CM

## 2023-08-05 DIAGNOSIS — J4520 Mild intermittent asthma, uncomplicated: Principal | ICD-10-CM

## 2023-08-05 MED ORDER — BUDESONIDE-FORMOTEROL HFA 80 MCG-4.5 MCG/ACTUATION AEROSOL INHALER
Freq: Two times a day (BID) | RESPIRATORY_TRACT | 11 refills | 21.00000 days | Status: CP
Start: 2023-08-05 — End: 2023-09-04

## 2023-08-06 ENCOUNTER — Inpatient Hospital Stay: Admit: 2023-08-06 | Discharge: 2023-08-07 | Payer: MEDICARE

## 2023-08-11 ENCOUNTER — Encounter: Admit: 2023-08-11 | Discharge: 2023-08-12 | Payer: MEDICARE | Attending: Clinical | Primary: Clinical

## 2023-08-12 ENCOUNTER — Encounter: Admit: 2023-08-12 | Discharge: 2023-08-13 | Payer: MEDICARE

## 2023-08-12 DIAGNOSIS — F411 Generalized anxiety disorder: Principal | ICD-10-CM

## 2023-08-12 DIAGNOSIS — F332 Major depressive disorder, recurrent severe without psychotic features: Principal | ICD-10-CM

## 2023-08-12 DIAGNOSIS — F324 Major depressive disorder, single episode, in partial remission: Principal | ICD-10-CM

## 2023-08-12 MED ORDER — DULOXETINE 60 MG CAPSULE,DELAYED RELEASE
ORAL_CAPSULE | Freq: Two times a day (BID) | ORAL | 2 refills | 30.00000 days | Status: CP
Start: 2023-08-12 — End: 2023-11-10

## 2023-08-13 DIAGNOSIS — K7460 Unspecified cirrhosis of liver: Principal | ICD-10-CM

## 2023-08-13 DIAGNOSIS — K7682 Hepatic encephalopathy: Principal | ICD-10-CM

## 2023-08-13 MED ORDER — XIFAXAN 550 MG TABLET
ORAL_TABLET | Freq: Two times a day (BID) | ORAL | 1 refills | 90.00000 days | Status: CP
Start: 2023-08-13 — End: ?
  Filled 2023-08-14: qty 180, 90d supply, fill #0

## 2023-08-19 DIAGNOSIS — N644 Mastodynia: Principal | ICD-10-CM

## 2023-08-19 DIAGNOSIS — R4589 Other symptoms and signs involving emotional state: Principal | ICD-10-CM

## 2023-08-19 MED ORDER — LORAZEPAM 0.5 MG TABLET
ORAL_TABLET | ORAL | 0 refills | 0.00000 days | Status: CP
Start: 2023-08-19 — End: ?

## 2023-08-21 DIAGNOSIS — F324 Major depressive disorder, single episode, in partial remission: Principal | ICD-10-CM

## 2023-08-21 DIAGNOSIS — F411 Generalized anxiety disorder: Principal | ICD-10-CM

## 2023-08-22 ENCOUNTER — Inpatient Hospital Stay: Admit: 2023-08-22 | Discharge: 2023-08-23 | Payer: BLUE CROSS/BLUE SHIELD

## 2023-08-22 DIAGNOSIS — N644 Mastodynia: Principal | ICD-10-CM

## 2023-08-27 DIAGNOSIS — F324 Major depressive disorder, single episode, in partial remission: Principal | ICD-10-CM

## 2023-08-27 DIAGNOSIS — F411 Generalized anxiety disorder: Principal | ICD-10-CM

## 2023-08-28 ENCOUNTER — Ambulatory Visit: Admit: 2023-08-28 | Discharge: 2023-08-28 | Payer: BLUE CROSS/BLUE SHIELD

## 2023-08-28 DIAGNOSIS — F341 Dysthymic disorder: Principal | ICD-10-CM

## 2023-08-28 DIAGNOSIS — G894 Chronic pain syndrome: Principal | ICD-10-CM

## 2023-08-28 DIAGNOSIS — N644 Mastodynia: Principal | ICD-10-CM

## 2023-08-28 DIAGNOSIS — M797 Fibromyalgia: Principal | ICD-10-CM

## 2023-08-28 DIAGNOSIS — M7918 Myalgia, other site: Principal | ICD-10-CM

## 2023-08-28 MED ORDER — NALTREXONE 4.5 MG CAPSULE
ORAL_CAPSULE | ORAL | 0 refills | 0.00000 days | Status: CP
Start: 2023-08-28 — End: ?

## 2023-09-16 ENCOUNTER — Ambulatory Visit
Admit: 2023-09-16 | Discharge: 2023-09-17 | Payer: BLUE CROSS/BLUE SHIELD | Attending: Anesthesiology | Primary: Anesthesiology

## 2023-09-16 DIAGNOSIS — M542 Cervicalgia: Principal | ICD-10-CM

## 2023-09-16 DIAGNOSIS — M797 Fibromyalgia: Principal | ICD-10-CM

## 2023-09-16 DIAGNOSIS — G479 Sleep disorder, unspecified: Principal | ICD-10-CM

## 2023-09-17 DIAGNOSIS — F411 Generalized anxiety disorder: Principal | ICD-10-CM

## 2023-09-17 DIAGNOSIS — R45851 Suicidal ideations: Principal | ICD-10-CM

## 2023-09-17 DIAGNOSIS — F332 Major depressive disorder, recurrent severe without psychotic features: Principal | ICD-10-CM

## 2023-09-17 MED ORDER — HYDROXYZINE PAMOATE 25 MG CAPSULE
ORAL_CAPSULE | Freq: Four times a day (QID) | ORAL | 0 refills | 30.00000 days | Status: CP | PRN
Start: 2023-09-17 — End: 2023-10-17

## 2023-09-22 ENCOUNTER — Ambulatory Visit: Admit: 2023-09-22 | Discharge: 2023-09-23 | Payer: BLUE CROSS/BLUE SHIELD

## 2023-09-22 DIAGNOSIS — F411 Generalized anxiety disorder: Principal | ICD-10-CM

## 2023-09-22 DIAGNOSIS — F332 Major depressive disorder, recurrent severe without psychotic features: Principal | ICD-10-CM

## 2023-09-22 MED ORDER — SERTRALINE 25 MG TABLET
ORAL_TABLET | Freq: Every day | ORAL | 2 refills | 30.00000 days | Status: CP
Start: 2023-09-22 — End: 2023-12-21

## 2023-09-22 MED ORDER — HYDROXYZINE PAMOATE 25 MG CAPSULE
ORAL_CAPSULE | Freq: Four times a day (QID) | ORAL | 0 refills | 30.00000 days | Status: CP | PRN
Start: 2023-09-22 — End: 2023-10-22

## 2023-09-24 ENCOUNTER — Inpatient Hospital Stay: Admit: 2023-09-24 | Discharge: 2023-09-25 | Payer: BLUE CROSS/BLUE SHIELD

## 2023-09-24 DIAGNOSIS — F332 Major depressive disorder, recurrent severe without psychotic features: Principal | ICD-10-CM

## 2023-09-24 DIAGNOSIS — R45851 Suicidal ideations: Principal | ICD-10-CM

## 2023-09-24 DIAGNOSIS — S92354A Nondisplaced fracture of fifth metatarsal bone, right foot, initial encounter for closed fracture: Principal | ICD-10-CM

## 2023-09-29 DIAGNOSIS — E7849 Other hyperlipidemia: Principal | ICD-10-CM

## 2023-09-29 DIAGNOSIS — I10 Essential (primary) hypertension: Principal | ICD-10-CM

## 2023-09-29 DIAGNOSIS — R0600 Dyspnea, unspecified: Principal | ICD-10-CM

## 2023-09-29 DIAGNOSIS — E785 Hyperlipidemia, unspecified: Principal | ICD-10-CM

## 2023-10-01 DIAGNOSIS — F332 Major depressive disorder, recurrent severe without psychotic features: Principal | ICD-10-CM

## 2023-10-01 DIAGNOSIS — R45851 Suicidal ideations: Principal | ICD-10-CM

## 2023-10-01 DIAGNOSIS — F411 Generalized anxiety disorder: Principal | ICD-10-CM

## 2023-10-02 DIAGNOSIS — F332 Major depressive disorder, recurrent severe without psychotic features: Principal | ICD-10-CM

## 2023-10-03 MED ORDER — CYCLOBENZAPRINE 10 MG TABLET
Freq: Two times a day (BID) | ORAL | 0.00000 days | PRN
Start: 2023-10-03 — End: ?

## 2023-10-06 DIAGNOSIS — F331 Major depressive disorder, recurrent, moderate: Principal | ICD-10-CM

## 2023-10-15 DIAGNOSIS — F331 Major depressive disorder, recurrent, moderate: Principal | ICD-10-CM

## 2023-10-15 DIAGNOSIS — F411 Generalized anxiety disorder: Principal | ICD-10-CM

## 2023-10-15 DIAGNOSIS — R45851 Suicidal ideations: Principal | ICD-10-CM

## 2023-10-15 DIAGNOSIS — N941 Unspecified dyspareunia: Principal | ICD-10-CM

## 2023-10-15 DIAGNOSIS — R7303 Prediabetes: Principal | ICD-10-CM

## 2023-10-15 MED ORDER — ESTRADIOL 0.01% (0.1 MG/GRAM) VAGINAL CREAM
VAGINAL | 11 refills | 0.00000 days | Status: CP
Start: 2023-10-15 — End: ?

## 2023-10-22 DIAGNOSIS — F411 Generalized anxiety disorder: Principal | ICD-10-CM

## 2023-10-22 DIAGNOSIS — F331 Major depressive disorder, recurrent, moderate: Principal | ICD-10-CM

## 2023-10-24 DIAGNOSIS — F332 Major depressive disorder, recurrent severe without psychotic features: Principal | ICD-10-CM

## 2023-10-27 ENCOUNTER — Ambulatory Visit: Admit: 2023-10-27 | Payer: BLUE CROSS/BLUE SHIELD | Attending: Anesthesiology | Primary: Anesthesiology

## 2023-10-28 DIAGNOSIS — F332 Major depressive disorder, recurrent severe without psychotic features: Principal | ICD-10-CM

## 2023-10-30 DIAGNOSIS — F332 Major depressive disorder, recurrent severe without psychotic features: Principal | ICD-10-CM

## 2023-11-04 ENCOUNTER — Ambulatory Visit
Admit: 2023-11-04 | Discharge: 2023-11-05 | Payer: BLUE CROSS/BLUE SHIELD | Attending: Anesthesiology | Primary: Anesthesiology

## 2023-11-04 DIAGNOSIS — M25551 Pain in right hip: Principal | ICD-10-CM

## 2023-11-04 DIAGNOSIS — M25552 Pain in left hip: Principal | ICD-10-CM

## 2023-11-04 MED ORDER — NALTREXONE 4.5 MG CAPSULE
ORAL_CAPSULE | ORAL | 0 refills | 0.00000 days | Status: CP
Start: 2023-11-04 — End: 2023-11-04

## 2023-11-05 ENCOUNTER — Inpatient Hospital Stay: Admit: 2023-11-05 | Discharge: 2023-11-05 | Payer: BLUE CROSS/BLUE SHIELD

## 2023-11-06 ENCOUNTER — Encounter: Admit: 2023-11-06 | Discharge: 2023-11-06 | Payer: BLUE CROSS/BLUE SHIELD

## 2023-11-06 DIAGNOSIS — R350 Frequency of micturition: Principal | ICD-10-CM

## 2023-11-06 DIAGNOSIS — I10 Essential (primary) hypertension: Principal | ICD-10-CM

## 2023-11-06 MED ORDER — NITROFURANTOIN MONOHYDRATE/MACROCRYSTALS 100 MG CAPSULE
ORAL_CAPSULE | Freq: Two times a day (BID) | ORAL | 0 refills | 5.00000 days | Status: CP
Start: 2023-11-06 — End: 2023-11-11

## 2023-11-06 MED ORDER — HYDROCHLOROTHIAZIDE 12.5 MG TABLET
ORAL_TABLET | Freq: Every day | ORAL | 3 refills | 90.00000 days | Status: CP
Start: 2023-11-06 — End: ?

## 2023-11-07 DIAGNOSIS — F332 Major depressive disorder, recurrent severe without psychotic features: Principal | ICD-10-CM

## 2023-11-10 DIAGNOSIS — F331 Major depressive disorder, recurrent, moderate: Principal | ICD-10-CM

## 2023-11-10 DIAGNOSIS — F411 Generalized anxiety disorder: Principal | ICD-10-CM

## 2023-11-10 DIAGNOSIS — G4700 Insomnia, unspecified: Principal | ICD-10-CM

## 2023-11-10 MED ORDER — SERTRALINE 100 MG TABLET
ORAL_TABLET | Freq: Every day | ORAL | 0 refills | 90.00000 days | Status: CP
Start: 2023-11-10 — End: 2024-02-08

## 2023-11-10 MED ORDER — TRAZODONE 50 MG TABLET
ORAL_TABLET | Freq: Every evening | ORAL | 0 refills | 90.00000 days | Status: CP
Start: 2023-11-10 — End: 2024-02-08

## 2023-11-10 MED FILL — XIFAXAN 550 MG TABLET: ORAL | 90 days supply | Qty: 180 | Fill #1

## 2023-11-14 DIAGNOSIS — F332 Major depressive disorder, recurrent severe without psychotic features: Principal | ICD-10-CM

## 2023-11-17 DIAGNOSIS — F332 Major depressive disorder, recurrent severe without psychotic features: Principal | ICD-10-CM

## 2023-11-19 DIAGNOSIS — F411 Generalized anxiety disorder: Principal | ICD-10-CM

## 2023-11-19 DIAGNOSIS — F331 Major depressive disorder, recurrent, moderate: Principal | ICD-10-CM

## 2023-11-26 ENCOUNTER — Ambulatory Visit: Admit: 2023-11-26 | Discharge: 2023-11-26 | Payer: BLUE CROSS/BLUE SHIELD

## 2023-11-26 DIAGNOSIS — K7581 Nonalcoholic steatohepatitis (NASH): Principal | ICD-10-CM

## 2023-11-26 DIAGNOSIS — F332 Major depressive disorder, recurrent severe without psychotic features: Principal | ICD-10-CM

## 2023-11-26 DIAGNOSIS — K74 Liver fibrosis: Principal | ICD-10-CM

## 2023-11-27 DIAGNOSIS — E876 Hypokalemia: Principal | ICD-10-CM

## 2023-11-27 DIAGNOSIS — F411 Generalized anxiety disorder: Principal | ICD-10-CM

## 2023-11-27 DIAGNOSIS — F331 Major depressive disorder, recurrent, moderate: Principal | ICD-10-CM

## 2023-11-27 MED ORDER — POTASSIUM CHLORIDE ER 20 MEQ TABLET,EXTENDED RELEASE(PART/CRYST)
ORAL_TABLET | Freq: Every day | ORAL | 3 refills | 100.00000 days | Status: CP
Start: 2023-11-27 — End: 2024-11-26

## 2023-12-08 DIAGNOSIS — G4700 Insomnia, unspecified: Principal | ICD-10-CM

## 2023-12-08 DIAGNOSIS — F332 Major depressive disorder, recurrent severe without psychotic features: Principal | ICD-10-CM

## 2023-12-08 DIAGNOSIS — F411 Generalized anxiety disorder: Principal | ICD-10-CM

## 2023-12-08 MED ORDER — SERTRALINE 100 MG TABLET
ORAL_TABLET | Freq: Every day | ORAL | 0 refills | 90.00000 days | Status: CP
Start: 2023-12-08 — End: 2024-03-07

## 2023-12-08 MED ORDER — TRAZODONE 50 MG TABLET
ORAL_TABLET | Freq: Every evening | ORAL | 2 refills | 45.00000 days | Status: CP
Start: 2023-12-08 — End: 2024-04-21

## 2023-12-09 ENCOUNTER — Inpatient Hospital Stay: Admit: 2023-12-09 | Discharge: 2023-12-09 | Payer: BLUE CROSS/BLUE SHIELD

## 2023-12-11 MED ORDER — SUMATRIPTAN 50 MG TABLET
ORAL_TABLET | ORAL | 0 refills | 5.00000 days | Status: CP | PRN
Start: 2023-12-11 — End: ?

## 2023-12-15 MED ORDER — NALTREXONE (BULK) 100 % POWDER
Freq: Every day | ORAL | 2 refills | 0.00000 days | Status: CP
Start: 2023-12-15 — End: 2024-03-14

## 2023-12-17 DIAGNOSIS — F411 Generalized anxiety disorder: Principal | ICD-10-CM

## 2023-12-17 DIAGNOSIS — F331 Major depressive disorder, recurrent, moderate: Principal | ICD-10-CM

## 2023-12-19 DIAGNOSIS — F332 Major depressive disorder, recurrent severe without psychotic features: Principal | ICD-10-CM

## 2023-12-23 MED ORDER — HYDROXYZINE PAMOATE 25 MG CAPSULE
ORAL_CAPSULE | INTRAMUSCULAR | 0 refills | 0.00000 days | Status: CP
Start: 2023-12-23 — End: ?

## 2023-12-24 DIAGNOSIS — F332 Major depressive disorder, recurrent severe without psychotic features: Principal | ICD-10-CM

## 2024-01-07 DIAGNOSIS — F332 Major depressive disorder, recurrent severe without psychotic features: Principal | ICD-10-CM

## 2024-01-14 DIAGNOSIS — F411 Generalized anxiety disorder: Principal | ICD-10-CM

## 2024-01-14 DIAGNOSIS — F331 Major depressive disorder, recurrent, moderate: Principal | ICD-10-CM

## 2024-01-21 ENCOUNTER — Ambulatory Visit: Admit: 2024-01-21 | Discharge: 2024-01-22 | Payer: BLUE CROSS/BLUE SHIELD

## 2024-01-21 DIAGNOSIS — R32 Unspecified urinary incontinence: Principal | ICD-10-CM

## 2024-01-21 DIAGNOSIS — R350 Frequency of micturition: Principal | ICD-10-CM

## 2024-01-27 ENCOUNTER — Ambulatory Visit: Admit: 2024-01-27 | Discharge: 2024-01-28 | Payer: BLUE CROSS/BLUE SHIELD

## 2024-01-27 ENCOUNTER — Encounter: Admit: 2024-01-27 | Discharge: 2024-01-28 | Payer: BLUE CROSS/BLUE SHIELD | Attending: Family | Primary: Family

## 2024-01-27 DIAGNOSIS — N393 Stress incontinence (female) (male): Principal | ICD-10-CM

## 2024-01-27 DIAGNOSIS — R399 Unspecified symptoms and signs involving the genitourinary system: Principal | ICD-10-CM

## 2024-01-27 DIAGNOSIS — I10 Essential (primary) hypertension: Principal | ICD-10-CM

## 2024-01-27 DIAGNOSIS — E876 Hypokalemia: Principal | ICD-10-CM

## 2024-01-28 DIAGNOSIS — F332 Major depressive disorder, recurrent severe without psychotic features: Principal | ICD-10-CM

## 2024-01-28 DIAGNOSIS — F331 Major depressive disorder, recurrent, moderate: Principal | ICD-10-CM

## 2024-02-02 DIAGNOSIS — K7460 Unspecified cirrhosis of liver: Principal | ICD-10-CM

## 2024-02-02 DIAGNOSIS — K7682 Hepatic encephalopathy    (CMS-HCC): Principal | ICD-10-CM

## 2024-02-02 MED ORDER — XIFAXAN 550 MG TABLET
ORAL_TABLET | Freq: Two times a day (BID) | ORAL | 1 refills | 90.00000 days
Start: 2024-02-02 — End: ?

## 2024-02-03 DIAGNOSIS — K7460 Unspecified cirrhosis of liver: Principal | ICD-10-CM

## 2024-02-03 DIAGNOSIS — K7682 Hepatic encephalopathy    (CMS-HCC): Principal | ICD-10-CM

## 2024-02-03 MED ORDER — XIFAXAN 550 MG TABLET
ORAL_TABLET | Freq: Two times a day (BID) | ORAL | 1 refills | 90.00000 days | Status: CP
Start: 2024-02-03 — End: ?
  Filled 2024-02-06: qty 180, 90d supply, fill #0

## 2024-02-06 DIAGNOSIS — N644 Mastodynia: Principal | ICD-10-CM

## 2024-02-09 DIAGNOSIS — F332 Major depressive disorder, recurrent severe without psychotic features: Principal | ICD-10-CM

## 2024-02-11 ENCOUNTER — Inpatient Hospital Stay: Admit: 2024-02-11 | Discharge: 2024-02-13 | Payer: BLUE CROSS/BLUE SHIELD

## 2024-02-18 DIAGNOSIS — F332 Major depressive disorder, recurrent severe without psychotic features: Principal | ICD-10-CM

## 2024-02-19 DIAGNOSIS — F331 Major depressive disorder, recurrent, moderate: Principal | ICD-10-CM

## 2024-02-19 DIAGNOSIS — F411 Generalized anxiety disorder: Principal | ICD-10-CM

## 2024-02-20 ENCOUNTER — Ambulatory Visit: Admit: 2024-02-20 | Discharge: 2024-02-21 | Payer: Medicaid (Managed Care)

## 2024-02-20 MED ORDER — NALTREXONE (BULK) 100 % POWDER
Freq: Every day | ORAL | 2 refills | 0.00000 days | Status: CP
Start: 2024-02-20 — End: 2024-05-20

## 2024-02-23 DIAGNOSIS — F331 Major depressive disorder, recurrent, moderate: Principal | ICD-10-CM

## 2024-02-23 DIAGNOSIS — F332 Major depressive disorder, recurrent severe without psychotic features: Principal | ICD-10-CM

## 2024-02-24 DIAGNOSIS — M797 Fibromyalgia: Principal | ICD-10-CM

## 2024-02-24 DIAGNOSIS — G4733 Obstructive sleep apnea (adult) (pediatric): Principal | ICD-10-CM

## 2024-02-24 MED ORDER — NALTREXONE (BULK) 100 % POWDER
Freq: Every day | ORAL | 2 refills | 0.00000 days | Status: CP
Start: 2024-02-24 — End: 2024-05-24

## 2024-03-03 MED ORDER — HYDROXYZINE PAMOATE 25 MG CAPSULE
ORAL_CAPSULE | ORAL | 0 refills | 20.00000 days | PRN
Start: 2024-03-03 — End: ?

## 2024-03-04 MED ORDER — HYDROXYZINE PAMOATE 25 MG CAPSULE
ORAL_CAPSULE | ORAL | 0 refills | 20.00000 days | Status: CP | PRN
Start: 2024-03-04 — End: ?

## 2024-03-05 MED ORDER — SERTRALINE 100 MG TABLET
ORAL_TABLET | Freq: Every day | ORAL | 0 refills | 90.00000 days
Start: 2024-03-05 — End: 2024-06-03

## 2024-03-08 MED ORDER — SERTRALINE 100 MG TABLET
ORAL_TABLET | Freq: Every day | ORAL | 0 refills | 90.00000 days | Status: CP
Start: 2024-03-08 — End: 2024-06-06

## 2024-03-10 ENCOUNTER — Ambulatory Visit: Admit: 2024-03-10 | Discharge: 2024-03-11 | Payer: BLUE CROSS/BLUE SHIELD

## 2024-03-10 DIAGNOSIS — G2581 Restless legs syndrome: Principal | ICD-10-CM

## 2024-03-10 DIAGNOSIS — G4710 Hypersomnia, unspecified: Principal | ICD-10-CM

## 2024-03-10 DIAGNOSIS — G4733 Obstructive sleep apnea (adult) (pediatric): Principal | ICD-10-CM

## 2024-03-16 DIAGNOSIS — F332 Major depressive disorder, recurrent severe without psychotic features: Principal | ICD-10-CM

## 2024-03-23 DIAGNOSIS — F332 Major depressive disorder, recurrent severe without psychotic features: Principal | ICD-10-CM
